# Patient Record
Sex: Female | Born: 1950
Health system: Southern US, Community
[De-identification: ages and names within clinical notes are randomized; demographics above are authoritative.]

## PROBLEM LIST (undated history)

## (undated) DIAGNOSIS — J45909 Unspecified asthma, uncomplicated: Secondary | ICD-10-CM

## (undated) DIAGNOSIS — F32A Depression, unspecified: Secondary | ICD-10-CM

## (undated) DIAGNOSIS — K5909 Other constipation: Secondary | ICD-10-CM

## (undated) DIAGNOSIS — M545 Low back pain, unspecified: Secondary | ICD-10-CM

## (undated) DIAGNOSIS — F419 Anxiety disorder, unspecified: Secondary | ICD-10-CM

## (undated) DIAGNOSIS — M25552 Pain in left hip: Secondary | ICD-10-CM

## (undated) DIAGNOSIS — D259 Leiomyoma of uterus, unspecified: Secondary | ICD-10-CM

## (undated) DIAGNOSIS — M25551 Pain in right hip: Secondary | ICD-10-CM

## (undated) DIAGNOSIS — R0600 Dyspnea, unspecified: Secondary | ICD-10-CM

## (undated) DIAGNOSIS — I2510 Atherosclerotic heart disease of native coronary artery without angina pectoris: Secondary | ICD-10-CM

## (undated) DIAGNOSIS — E785 Hyperlipidemia, unspecified: Secondary | ICD-10-CM

## (undated) DIAGNOSIS — J449 Chronic obstructive pulmonary disease, unspecified: Secondary | ICD-10-CM

## (undated) DIAGNOSIS — I10 Essential (primary) hypertension: Secondary | ICD-10-CM

## (undated) DIAGNOSIS — N894 Leukoplakia of vagina: Secondary | ICD-10-CM

## (undated) DIAGNOSIS — F329 Major depressive disorder, single episode, unspecified: Secondary | ICD-10-CM

## (undated) HISTORY — DX: Other constipation: K59.09

## (undated) HISTORY — DX: Depression, unspecified: F32.A

## (undated) HISTORY — DX: Low back pain: M54.5

## (undated) HISTORY — DX: Leiomyoma of uterus, unspecified: D25.9

## (undated) HISTORY — DX: Anxiety disorder, unspecified: F41.9

## (undated) HISTORY — DX: Chronic obstructive pulmonary disease, unspecified: J44.9

## (undated) HISTORY — PX: UTERINE FIBROID SURGERY: SHX826

## (undated) HISTORY — PX: LEEP: SHX91

## (undated) HISTORY — DX: Low back pain, unspecified: M54.50

## (undated) HISTORY — PX: TUBAL LIGATION: SHX77

## (undated) HISTORY — DX: Hyperlipidemia, unspecified: E78.5

## (undated) HISTORY — DX: Major depressive disorder, single episode, unspecified: F32.9

---

## 1998-10-05 ENCOUNTER — Other Ambulatory Visit: Admission: RE | Admit: 1998-10-05 | Discharge: 1998-10-05 | Payer: Self-pay | Admitting: Obstetrics and Gynecology

## 2000-09-26 ENCOUNTER — Other Ambulatory Visit: Admission: RE | Admit: 2000-09-26 | Discharge: 2000-09-26 | Payer: Self-pay | Admitting: Obstetrics

## 2000-09-26 ENCOUNTER — Encounter: Admission: RE | Admit: 2000-09-26 | Discharge: 2000-09-26 | Payer: Self-pay | Admitting: Obstetrics

## 2000-10-03 ENCOUNTER — Encounter: Admission: RE | Admit: 2000-10-03 | Discharge: 2000-10-03 | Payer: Self-pay | Admitting: Internal Medicine

## 2000-10-07 ENCOUNTER — Encounter: Admission: RE | Admit: 2000-10-07 | Discharge: 2000-10-07 | Payer: Self-pay | Admitting: Internal Medicine

## 2000-10-08 ENCOUNTER — Ambulatory Visit (HOSPITAL_COMMUNITY): Admission: RE | Admit: 2000-10-08 | Discharge: 2000-10-08 | Payer: Self-pay

## 2000-10-10 ENCOUNTER — Encounter: Admission: RE | Admit: 2000-10-10 | Discharge: 2000-10-10 | Payer: Self-pay | Admitting: Obstetrics

## 2000-11-20 ENCOUNTER — Encounter: Admission: RE | Admit: 2000-11-20 | Discharge: 2000-11-20 | Payer: Self-pay | Admitting: Obstetrics

## 2000-11-21 ENCOUNTER — Encounter: Admission: RE | Admit: 2000-11-21 | Discharge: 2000-11-21 | Payer: Self-pay | Admitting: Obstetrics

## 2000-11-22 ENCOUNTER — Inpatient Hospital Stay (HOSPITAL_COMMUNITY): Admission: AD | Admit: 2000-11-22 | Discharge: 2000-11-22 | Payer: Self-pay | Admitting: Obstetrics & Gynecology

## 2000-11-23 ENCOUNTER — Ambulatory Visit (HOSPITAL_COMMUNITY): Admission: RE | Admit: 2000-11-23 | Discharge: 2000-11-23 | Payer: Self-pay | Admitting: Obstetrics

## 2000-11-23 ENCOUNTER — Encounter (INDEPENDENT_AMBULATORY_CARE_PROVIDER_SITE_OTHER): Payer: Self-pay | Admitting: Specialist

## 2000-11-28 ENCOUNTER — Encounter: Admission: RE | Admit: 2000-11-28 | Discharge: 2000-11-28 | Payer: Self-pay | Admitting: Obstetrics

## 2000-11-29 HISTORY — PX: ABDOMINAL HYSTERECTOMY: SHX81

## 2000-12-09 ENCOUNTER — Encounter (INDEPENDENT_AMBULATORY_CARE_PROVIDER_SITE_OTHER): Payer: Self-pay

## 2000-12-09 ENCOUNTER — Inpatient Hospital Stay (HOSPITAL_COMMUNITY): Admission: RE | Admit: 2000-12-09 | Discharge: 2000-12-13 | Payer: Self-pay | Admitting: Obstetrics

## 2000-12-24 ENCOUNTER — Encounter: Admission: RE | Admit: 2000-12-24 | Discharge: 2000-12-24 | Payer: Self-pay | Admitting: Obstetrics & Gynecology

## 2001-01-30 ENCOUNTER — Encounter: Admission: RE | Admit: 2001-01-30 | Discharge: 2001-01-30 | Payer: Self-pay | Admitting: Obstetrics

## 2001-03-13 ENCOUNTER — Encounter: Admission: RE | Admit: 2001-03-13 | Discharge: 2001-03-13 | Payer: Self-pay | Admitting: *Deleted

## 2001-04-16 ENCOUNTER — Encounter: Admission: RE | Admit: 2001-04-16 | Discharge: 2001-04-16 | Payer: Self-pay | Admitting: Internal Medicine

## 2001-06-27 ENCOUNTER — Encounter: Admission: RE | Admit: 2001-06-27 | Discharge: 2001-06-27 | Payer: Self-pay | Admitting: Internal Medicine

## 2001-11-11 ENCOUNTER — Ambulatory Visit (HOSPITAL_COMMUNITY): Admission: RE | Admit: 2001-11-11 | Discharge: 2001-11-11 | Payer: Self-pay | Admitting: Internal Medicine

## 2001-11-11 ENCOUNTER — Encounter: Admission: RE | Admit: 2001-11-11 | Discharge: 2001-11-11 | Payer: Self-pay | Admitting: Internal Medicine

## 2002-10-12 ENCOUNTER — Encounter: Admission: RE | Admit: 2002-10-12 | Discharge: 2002-10-12 | Payer: Self-pay | Admitting: Internal Medicine

## 2003-09-08 ENCOUNTER — Other Ambulatory Visit: Admission: RE | Admit: 2003-09-08 | Discharge: 2003-09-08 | Payer: Self-pay | Admitting: Family Medicine

## 2003-09-15 ENCOUNTER — Ambulatory Visit (HOSPITAL_COMMUNITY): Admission: RE | Admit: 2003-09-15 | Discharge: 2003-09-15 | Payer: Self-pay | Admitting: Family Medicine

## 2003-10-26 ENCOUNTER — Encounter: Payer: Self-pay | Admitting: Internal Medicine

## 2003-10-26 ENCOUNTER — Ambulatory Visit (HOSPITAL_COMMUNITY): Admission: RE | Admit: 2003-10-26 | Discharge: 2003-10-26 | Payer: Self-pay | Admitting: Gastroenterology

## 2004-02-24 ENCOUNTER — Encounter: Admission: RE | Admit: 2004-02-24 | Discharge: 2004-02-24 | Payer: Self-pay | Admitting: Neurological Surgery

## 2005-10-02 ENCOUNTER — Ambulatory Visit: Payer: Self-pay | Admitting: Hospitalist

## 2005-10-04 ENCOUNTER — Ambulatory Visit (HOSPITAL_COMMUNITY): Admission: RE | Admit: 2005-10-04 | Discharge: 2005-10-04 | Payer: Self-pay | Admitting: Dermatology

## 2005-10-10 ENCOUNTER — Ambulatory Visit: Payer: Self-pay | Admitting: Internal Medicine

## 2005-10-25 ENCOUNTER — Emergency Department (HOSPITAL_COMMUNITY): Admission: EM | Admit: 2005-10-25 | Discharge: 2005-10-25 | Payer: Self-pay | Admitting: Emergency Medicine

## 2005-11-21 ENCOUNTER — Ambulatory Visit (HOSPITAL_COMMUNITY): Payer: Self-pay | Admitting: Psychiatry

## 2006-01-04 ENCOUNTER — Encounter (INDEPENDENT_AMBULATORY_CARE_PROVIDER_SITE_OTHER): Payer: Self-pay | Admitting: Dermatology

## 2006-01-04 ENCOUNTER — Ambulatory Visit: Payer: Self-pay | Admitting: Internal Medicine

## 2006-01-04 LAB — CONVERTED CEMR LAB
ALT: 12 units/L (ref 0–35)
Alkaline Phosphatase: 63 units/L (ref 39–117)
Sodium: 136 meq/L (ref 135–145)
Total Bilirubin: 0.6 mg/dL (ref 0.3–1.2)
Total Protein: 7.4 g/dL (ref 6.0–8.3)

## 2006-01-24 DIAGNOSIS — K649 Unspecified hemorrhoids: Secondary | ICD-10-CM | POA: Insufficient documentation

## 2006-01-24 DIAGNOSIS — D259 Leiomyoma of uterus, unspecified: Secondary | ICD-10-CM | POA: Insufficient documentation

## 2006-01-24 DIAGNOSIS — K5909 Other constipation: Secondary | ICD-10-CM | POA: Insufficient documentation

## 2006-01-24 DIAGNOSIS — F329 Major depressive disorder, single episode, unspecified: Secondary | ICD-10-CM | POA: Insufficient documentation

## 2006-01-24 DIAGNOSIS — F419 Anxiety disorder, unspecified: Secondary | ICD-10-CM | POA: Insufficient documentation

## 2006-01-24 DIAGNOSIS — J309 Allergic rhinitis, unspecified: Secondary | ICD-10-CM | POA: Insufficient documentation

## 2006-01-28 ENCOUNTER — Ambulatory Visit: Payer: Self-pay | Admitting: Internal Medicine

## 2006-03-25 ENCOUNTER — Ambulatory Visit: Payer: Self-pay | Admitting: Hospitalist

## 2006-03-25 DIAGNOSIS — IMO0002 Reserved for concepts with insufficient information to code with codable children: Secondary | ICD-10-CM | POA: Insufficient documentation

## 2006-07-02 ENCOUNTER — Ambulatory Visit: Payer: Self-pay | Admitting: Internal Medicine

## 2006-07-02 ENCOUNTER — Encounter (INDEPENDENT_AMBULATORY_CARE_PROVIDER_SITE_OTHER): Payer: Self-pay | Admitting: Dermatology

## 2006-07-23 ENCOUNTER — Encounter (INDEPENDENT_AMBULATORY_CARE_PROVIDER_SITE_OTHER): Payer: Self-pay | Admitting: Dermatology

## 2006-08-21 ENCOUNTER — Encounter (INDEPENDENT_AMBULATORY_CARE_PROVIDER_SITE_OTHER): Payer: Self-pay | Admitting: Internal Medicine

## 2008-05-18 ENCOUNTER — Ambulatory Visit: Payer: Self-pay | Admitting: Internal Medicine

## 2008-05-18 ENCOUNTER — Encounter (INDEPENDENT_AMBULATORY_CARE_PROVIDER_SITE_OTHER): Payer: Self-pay | Admitting: Internal Medicine

## 2008-05-18 DIAGNOSIS — N894 Leukoplakia of vagina: Secondary | ICD-10-CM | POA: Insufficient documentation

## 2008-05-18 DIAGNOSIS — R5381 Other malaise: Secondary | ICD-10-CM | POA: Insufficient documentation

## 2008-05-18 DIAGNOSIS — R5383 Other fatigue: Secondary | ICD-10-CM

## 2008-05-19 ENCOUNTER — Ambulatory Visit (HOSPITAL_COMMUNITY): Admission: RE | Admit: 2008-05-19 | Discharge: 2008-05-19 | Payer: Self-pay | Admitting: Internal Medicine

## 2008-05-19 ENCOUNTER — Encounter (INDEPENDENT_AMBULATORY_CARE_PROVIDER_SITE_OTHER): Payer: Self-pay | Admitting: Internal Medicine

## 2008-05-19 DIAGNOSIS — E785 Hyperlipidemia, unspecified: Secondary | ICD-10-CM | POA: Insufficient documentation

## 2008-05-19 LAB — CONVERTED CEMR LAB
Basophils Absolute: 0.1 10*3/uL (ref 0.0–0.1)
CO2: 26 meq/L (ref 19–32)
Candida species: NEGATIVE
Cholesterol: 204 mg/dL — ABNORMAL HIGH (ref 0–200)
GFR calc Af Amer: >60 mL/min (ref >60)
GFR calc non Af Amer: >60 mL/min (ref >60)
Gardnerella vaginalis: POSITIVE — AB
Glucose, Bld: 89 mg/dL (ref 70–99)
Hemoglobin: 12.2 g/dL (ref 12.0–15.0)
Lymphocytes Relative: 37 % (ref 12–46)
Monocytes Absolute: 0.4 10*3/uL (ref 0.1–1.0)
Neutro Abs: 3.9 10*3/uL (ref 1.7–7.7)
Neutrophils Relative %: 54 % (ref 43–77)
Platelets: 234 10*3/uL (ref 150–400)
RDW: 13.4 % (ref 11.5–15.5)
Sed Rate: 9 mm/hr (ref 0–22)
Sodium: 137 meq/L (ref 135–145)
Total Bilirubin: 0.4 mg/dL (ref 0.3–1.2)
Total Protein: 7.7 g/dL (ref 6.0–8.3)
Trichomonal Vaginitis: NEGATIVE

## 2008-06-16 ENCOUNTER — Ambulatory Visit: Payer: Self-pay | Admitting: Infectious Disease

## 2008-10-08 ENCOUNTER — Ambulatory Visit: Payer: Self-pay | Admitting: Infectious Diseases

## 2008-10-08 DIAGNOSIS — M25559 Pain in unspecified hip: Secondary | ICD-10-CM | POA: Insufficient documentation

## 2008-10-21 ENCOUNTER — Ambulatory Visit (HOSPITAL_COMMUNITY): Admission: RE | Admit: 2008-10-21 | Discharge: 2008-10-21 | Payer: Self-pay | Admitting: Infectious Diseases

## 2009-07-05 ENCOUNTER — Ambulatory Visit: Payer: Self-pay | Admitting: Internal Medicine

## 2009-07-20 ENCOUNTER — Ambulatory Visit: Payer: Self-pay | Admitting: Internal Medicine

## 2009-07-21 ENCOUNTER — Ambulatory Visit: Payer: Self-pay | Admitting: Internal Medicine

## 2009-07-25 LAB — CONVERTED CEMR LAB
Basophils Absolute: 0 10*3/uL (ref 0.0–0.1)
CO2: 26 meq/L (ref 19–32)
Calcium: 9.1 mg/dL (ref 8.4–10.5)
Chloride: 109 meq/L (ref 96–112)
Cholesterol: 216 mg/dL — ABNORMAL HIGH (ref 0–200)
Creatinine, Ser: 0.7 mg/dL (ref 0.4–1.2)
Eosinophils Absolute: 0.1 10*3/uL (ref 0.0–0.7)
Glucose, Bld: 88 mg/dL (ref 70–99)
Lymphocytes Relative: 35.9 % (ref 12.0–46.0)
MCHC: 34.4 g/dL (ref 30.0–36.0)
MCV: 89 fL (ref 78.0–100.0)
Monocytes Absolute: 0.3 10*3/uL (ref 0.1–1.0)
Neutro Abs: 3.4 10*3/uL (ref 1.4–7.7)
Neutrophils Relative %: 56.1 % (ref 43.0–77.0)
RDW: 13.8 % (ref 11.5–14.6)
TSH: 0.64 microintl units/mL (ref 0.35–5.50)
Triglycerides: 123 mg/dL (ref 0.0–149.0)

## 2009-07-26 ENCOUNTER — Telehealth: Payer: Self-pay | Admitting: Internal Medicine

## 2009-08-02 ENCOUNTER — Encounter (INDEPENDENT_AMBULATORY_CARE_PROVIDER_SITE_OTHER): Payer: Self-pay | Admitting: *Deleted

## 2009-09-19 ENCOUNTER — Ambulatory Visit: Payer: Self-pay | Admitting: Internal Medicine

## 2009-10-07 ENCOUNTER — Ambulatory Visit: Payer: Self-pay | Admitting: Internal Medicine

## 2009-10-07 DIAGNOSIS — M5416 Radiculopathy, lumbar region: Secondary | ICD-10-CM | POA: Insufficient documentation

## 2010-01-09 ENCOUNTER — Ambulatory Visit: Payer: Self-pay | Admitting: Internal Medicine

## 2010-01-09 DIAGNOSIS — J069 Acute upper respiratory infection, unspecified: Secondary | ICD-10-CM | POA: Insufficient documentation

## 2010-02-19 ENCOUNTER — Encounter: Payer: Self-pay | Admitting: Infectious Diseases

## 2010-02-28 NOTE — Progress Notes (Signed)
Summary: needs iFOB (lmom 6/28)  Phone Note Outgoing Call   Summary of Call: see addendum to the last office visit She needs an iFOB for colon cancer screening Please call the patient, let  her no that we are mailing her  a iFOB kit to  return it at her convenience Ridgely E. Lary Eckardt MD  July 26, 2009 2:51 PM   Follow-up for Phone Call        left message for pt to call back. [name] CMA  July 26, 2009 3:45 PM   Additional Follow-up for Phone Call Additional follow up Details #1::        Pt is aware, will mail to her. [name] CMA  July 27, 2009 3:54 PM

## 2010-02-28 NOTE — Letter (Signed)
Summary: [facility]: Immunoassay Fecal Occult Blood (iFOB) Order Form  [facility] [place]  [address] [place], [place] [address]   Phone: [phone]  Fax: [phone]      [facility]: Immunoassay Fecal Occult Blood (iFOB) Order Form   August 02, 2009 MRN: [id number]   Veronica [name] April 13, 1950   Physicican Name:___jose,paz md______________________  Diagnosis Code:______v76.51____________________      [name] CMA

## 2010-02-28 NOTE — Procedures (Signed)
Summary: Colonoscopy/[facility]  Colonoscopy/[facility]   Imported By: Lanelle Bal 08/04/2009 08:44:04  _____________________________________________________________________  External Attachment:    Type:   Image     Comment:   External Document

## 2010-02-28 NOTE — Assessment & Plan Note (Signed)
Summary: BACK PAIN/RH......Marland Kitchen   Vital Signs:  Patient profile:   60 year old female Weight:      214.25 pounds Pulse rate:   75 / minute Pulse rhythm:   regular BP sitting:   122 / 78  (left arm) Cuff size:   regular  Vitals Entered By: [name] CMA (October 07, 2009 10:57 AM) CC: Upper back pain x 4 days.  Comments Worse at night. Taking ibruprofen. [facility] Veterinary surgeon - Pharm flu shot   History of Present Illness: 4 days history of upper back pain, points to the right sided paraspinal muscles Indocin helps for a few hours diazepam is taken as a muscle relaxant, helping?   ROS denies fevers or cough No rash anywhere in the back No neck or shoulder pain per se No recent injury or heavy lifting  Current Medications (verified): 1)  Over-The-Counter Calcium 2 G of Vitamin D 600 Units Daily 2)  Indomethacin 25 Mg Caps (Indomethacin) .... 2 By Mouth Q12hrs As Needed 3)  Diazepam 5 Mg Tabs (Diazepam) .Marland Kitchen.. 1 By Mouth At  H.s.  Allergies (verified): No Known Drug Allergies  Past History:  Past Medical History: Reviewed history from 07/20/2009 and no changes required. Allergic rhinitis Anxiety, depression  Hemorrhoids Constipation, chronic Hx uterine fibroids Low-back pain with left-sided radiculopathy, s/p injection at L4-L5 foramen (1/06)  Past Surgical History: Tubal ligation, bilateral,1989 Hysterectomy, total abdominal, November 2002  (at [facility]) LOOP, unknown date  Social History: Reviewed history from 07/20/2009 and no changes required. 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  no apparent distress Neck:  full range of motion, nontender to palpation of the cervical spine Lungs:  normal respiratory  effort, no intercostal retractions, no accessory muscle use, and normal breath sounds.   Heart:  normal rate, regular rhythm, and no murmur.   Msk:  not tender a day thoracic spine or paraspinal muscles. Shoulder range of motion within normal Extremities:  no edema Neurologic:  alert & oriented X3, strength normal in all extremities, and gait normal.     Impression & Recommendations:  Problem # 1:  BACK PAIN, UPPER (ICD-724.5) likely muscle skeletal pain. Plan: Trial with Flexeril and Vicodin. See instructions Chest x-ray Her updated medication list for this problem includes:    Indomethacin 25 Mg Caps (Indomethacin) .Marland Kitchen... 2 by mouth q12hrs as needed    Flexeril 10 Mg Tabs (Cyclobenzaprine hcl) ..... One by mouth at bedtime as needed for pain    Vicodin 5-500 Mg Tabs (Hydrocodone-acetaminophen) ..... One every 6 hours as needed for pain  Orders: T-2 View CXR (71020TC)  Complete Medication List: 1)  Over-the-counter Calcium 2 G of Vitamin D 600 Units Daily  2)  Indomethacin 25 Mg Caps (Indomethacin) .... 2 by mouth q12hrs as needed 3)  Diazepam 5 Mg Tabs (Diazepam) .Marland Kitchen.. 1 by mouth at  h.s. 4)  Flexeril 10 Mg Tabs (Cyclobenzaprine hcl) .... One by mouth at bedtime as needed for pain 5)  Vicodin 5-500 Mg Tabs (Hydrocodone-acetaminophen) .... One every 6 hours as needed for pain  Other Orders: Admin 1st Vaccine (10272) Flu Vaccine 69yrs + (53664) Flu Vaccine Consent Questions     Do you have a history of severe allergic reactions to this vaccine?  no    Any prior history of allergic reactions to egg and/or gelatin? no    Do you have a sensitivity to the preservative Thimersol? no    Do you have a past history of Guillan-Barre Syndrome? no    Do you currently have an acute febrile illness? no    Have you ever had a severe reaction to latex? no    Vaccine information given and explained to patient? yes    Are you currently pregnant? no    Lot Number:[id number]   Exp  Date:07/29/2010   Site Given  Left Deltoid IM  Patient Instructions: 1)  continue Indocin 2)  Instead of diazepam try Flexeril at bedtime and see if that works better. Don't take both. 3)  If pain is severe, you can take Vicodin. Will cause drowsiness  4)  chest x-ray 5)  Warm compress 6)  call us if not better in 10 days Prescriptions: VICODIN 5-500 MG TABS (HYDROCODONE-ACETAMINOPHEN) one every 6 hours as needed for pain  #20 x 0   Entered and Authorized by:   Nolon Rod. Karlea Mckibbin MD   Signed by:   Nolon Rod. Lorenza Winkleman MD on 10/07/2009   Method used:   Print then Give to Patient   RxID:   [id number] FLEXERIL 10 MG TABS (CYCLOBENZAPRINE HCL) one by mouth at bedtime as needed for pain  #20 x 0   Entered and Authorized by:   Nolon Rod. Patches Mcdonnell MD   Signed by:   Nolon Rod. Rease Wence MD on 10/07/2009   Method used:   Print then Give to Patient   RxID:   [id number]    .lbflu

## 2010-02-28 NOTE — Assessment & Plan Note (Signed)
Summary: new to est/cpx//kn   Vital Signs:  Patient profile:   60 year old female Height:      65 inches Weight:      212 pounds BMI:     35.41 Temp:     98.5 degrees F oral Pulse rate:   66 / minute Resp:     16 per minute BP sitting:   110 / 70  (left arm)  Vitals Entered By: Jeremy Johann CMA (July 20, 2009 2:40 PM) CC: NEW TO ESTABLISH Comments --NOT FASTING --NO PAP REVIEWED MED LIST, PATIENT AGREED DOSE AND INSTRUCTION CORRECT    History of Present Illness: CPX   Allergies (verified): No Known Drug Allergies  Past History:  Past Medical History: Allergic rhinitis Anxiety, depression  Hemorrhoids Constipation, chronic Hx uterine fibroids Low-back pain with left-sided radiculopathy, s/p injection at L4-L5 foramen (1/06)  Past Surgical History:  Tubal ligation, bilateral,1989 Hysterectomy, total abdominal, November 2002  (at [facility]) LOOP, unknown date  Family History: some family members with schizophrenia (son, brother, 2 nieces) DM--no MI--no colon ca--no breast ca--no  Social History: 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 GI:  no nausea, vomiting, diarrhea. No blood in the stools. GU:  she does self breast exams sometimes Denies vaginal discharge or bleeding. MS:  long history of back pain, currently symptoms are quiet. Exercise helps. Psych:  emotionally doing well, no anxiety or depression.  Physical Exam  General:  alert and well-developed.   Neck:  no masses and no thyromegaly.   Breasts:  No mass, nodules, thickening, tenderness, bulging, retraction, inflamation, nipple discharge or skin changes noted.   Lungs:  normal respiratory effort, no intercostal retractions, no  accessory muscle use, and normal breath sounds.   Heart:  normal rate, regular rhythm, and no murmur.   Abdomen:  soft, normal bowel sounds, no distention, no masses, no guarding, and no rigidity.  slightly tender in the epigastric area without mass or rebound Extremities:  note or extremity edema Neurologic:  alert & oriented X3, strength normal in all extremities, and gait normal.   Psych:  Cognition and judgment appear intact. Alert and cooperative with normal attention span and concentration.  not anxious appearing and not depressed appearing.     Impression & Recommendations:  Problem # 1:  PREVENTIVE HEALTH CARE (ICD-V70.0)  reports tetanus shot less than 10 years ago  Reports a colonoscopy at [facility] approximately 6 years ago (2005) ; no records found in EMR. We'll call the hospital.  Due for a mammogram, patient aware breast exam today (-)   hysterectomy 2002 for bleeding , last PAP 2010 per patient  was told does not need  further  PAPs: rec a PAP q 3 years     Discussed diet and exercise Calcium and vitamin D daily Labs EKG-- NSR   Orders: EKG w/ Interpretation (93000)  Complete Medication List: 1)  Over-the-counter Calcium 2 G of Vitamin D 600 Units Daily   Patient Instructions: 1)  please come back fasting 2)  FLP, BMP, CBC, AST, ALT, TSH --- dx V70 3)  Please schedule a follow-up appointment in 1 year.     Appended Document: new to est/cpx//kn colonoscopy report review it and will  be scanned GI Dr. Sherin Quarry Procedure was October 26, 2003: Left-sided diverticulosis Recommend flex sigmoidoscopy in 5 years (2010) Recommend a repeat colonoscopy in 10 years (2015) Plan: Will send an iFOB

## 2010-03-02 NOTE — Assessment & Plan Note (Signed)
Summary: cold and cough; rt thumb hurts///sph   Vital Signs:  Patient profile:   60 year old female Weight:      225.50 pounds O2 Sat:      97 % on Room air Temp:     98.9 degrees F oral Pulse rate:   77 / minute Pulse rhythm:   regular BP sitting:   128 / 89  (left arm) Cuff size:   regular  Vitals Entered By: [name] CMA (January 09, 2010 2:22 PM)  O2 Flow:  Room air CC: c/o chest and head congestion Comments x 10 days drainage  eyes swelling c/o (R) thumb pain x 2 weeks [facility]    History of Present Illness: 10 days h/o:  chest and head congestion nasal drainage  eyes swelling   c/o (R) thumb pain x 2 weeks  Current Medications (verified): 1)  Over-The-Counter Calcium 2 G of Vitamin D 600 Units Daily 2)  Ibuprofen 200 Mg Tabs (Ibuprofen) .... As Needed  Allergies (verified): No Known Drug Allergies  Past History:  Past Medical History: Reviewed history from 07/20/2009 and no changes required. Allergic rhinitis Anxiety, depression  Hemorrhoids Constipation, chronic Hx uterine fibroids Low-back pain with left-sided radiculopathy, s/p injection at L4-L5 foramen (1/06)  Past Surgical History: Reviewed history from 10/07/2009 and no changes required. Tubal ligation, bilateral,1989 Hysterectomy, total abdominal, November 2002  (at [facility]) LOOP, unknown date  Social History: Reviewed history from 07/20/2009 and no changes required. 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 ENT:  Denies earache and sore throat. GI:  Denies diarrhea, nausea, and vomiting. MS:  Denies muscle aches.  Physical Exam  General:  alert and well-developed.   Head:  face symmetric, slightly tender at the  maxillary sinus area Ears:  R ear normal and L ear normal.   Nose:  slightly congested Mouth:  no redness or discharge Lungs:  normal respiratory effort, no intercostal retractions, no accessory muscle use, and normal breath sounds.   Extremities:  thumb on the right---- slightly swollen, no red or warm. There is a click whenever she flexes or extends the thumb L thumb normal   Impression & Recommendations:  Problem # 1:  URI (ICD-465.9) see instructions    Her updated medication list for this problem includes:    Ibuprofen 200 Mg Tabs (Ibuprofen) .Marland Kitchen... As needed  Problem # 2:  thumb pain will see her ortho  Complete Medication List: 1)  Over-the-counter Calcium 2 G of Vitamin D 600 Units Daily  2)  Ibuprofen 200 Mg Tabs (Ibuprofen) .... As needed 3)  Amoxicillin 500 Mg Caps (Amoxicillin) .... 2 by mouth two times a day  Patient Instructions: 1)  rest, fluids, tylenol 2)  mucinex DM twice a day as needed for cough or dayquil 3)  amoxicillin x 1 week 4)  call if no better in few days  Prescriptions: AMOXICILLIN 500 MG CAPS (AMOXICILLIN) 2 by mouth two times a day  #28 x 0   Entered and Authorized by:   Nolon Rod. Paz MD   Signed by:   Nolon Rod. Paz MD on 01/09/2010   Method used:   Print then Give to Patient   RxID:   [id number]    Orders  Added: 1)  Est. Patient Level III [29562]

## 2010-06-16 NOTE — Op Note (Signed)
[facility]  Patient:    Veronica [name], Veronica [name] Visit Number: [id number] MRN: [id number]          Service Type: GYN Location: 9300 [id number] Attending Physician:  Tammi Sou Dictated by:   Bing Neighbors Clearance Coots, M.D. Proc. Date: 12/09/00 Admit Date:  12/09/2000                             Operative Report  PREOPERATIVE DIAGNOSES:       Symptomatic uterine fibroids.  POSTOPERATIVE DIAGNOSES:      Symptomatic uterine fibroids.  PROCEDURE:                    Total abdominal hysterectomy.  SURGEON:                      Charles A. Clearance Coots, M.D.  ASSISTANT:                    Roseanna Rainbow, M.D.  ANESTHESIA:                   General.  ESTIMATED BLOOD LOSS:         450 ml.  INTRAVENOUS FLUIDS:           1500 ml.  URINE OUT:                    200 ml clear.  COMPLICATIONS:                None.  DRAINS:                       Foley to gravity.  SPECIMEN:                     Uterus with cervix.  FINDINGS:                     Approximately 16 week size uterus with multiple uterine fibroids.  Normal ovaries and fallopian tubes.  OPERATION:                    Patient was brought to the operating room and after satisfactory general endotracheal anesthesia, the abdomen was prepped and draped in the usual sterile fashion in the supine position.  An indwelling Foley catheter was inserted in the urinary bladder.  A Pfannenstiel skin incision was made with the scalpel that was deepened down to the fascia with the scalpel.  The fascia was nicked in the midline and the fascial incision was extended to the left and to the right with curved Mayo scissors.  The superior and inferior fascial edges were taken off the rectus muscle with both blunt and sharp dissection.  The rectus muscle was then bluntly divided in the midline and peritoneum was entered digitally and was digitally extended to the left and to the right.  The bladder blade was then  positioned.  The bowel was packed off after insertion of an Landscape architect.  The utero-ovarian ligaments and round ligaments were then grasped with Kelly forceps bilaterally and the uterus was exteriorized.  The round ligaments were grasped with Kelly forceps and were transected bilaterally with the Bovie. The vesicouterine fold of peritoneum anteriorly was transected with the Bovie and the bladder was pushed downward away from the operative field.  The remainder of the ______  fibers of the bladder were transected sharply and further developed bluntly away from the operative field.  The white epithelium of the lower uterine segment and cervical end of the uterus could then be visualized quite well.  The posterior peritoneum was transected with the Bovie down to the uterosacral ligament.  A window was bluntly made in the broad ligament parallel to the uterus close to the uterine body.  Two Heaney forceps were placed across the utero-ovarian ligaments and broad ligaments and the ligaments were then transected on the right and a free tie was placed beneath the clamp and transfixing suture was placed above the knot.  Same procedure was performed on the opposite side.  The uterine vessels on the right were then skeletonized and curved Heaney was placed across the uterine vessels at the level of the internal os of the cervix.  A straight Heaney clamp was placed above to prevent back flow and the vessels were transected with the scalpel and suture ligated with a transfixion suture of 0 Vicryl.  The same procedure was performed on the opposite side without complications.  The cardinal ligaments were then serially transected and suture ligated with transfixion sutures bilaterally down to the uterosacral ligaments which were transected and suture ligated bilaterally.  The vagina was then entered with the next bite on the right and the vagina including the uterosacral ligament was suture  ligated with 0 Vicryl transfixion suture.  Same procedure was performed on the opposite side.  Two parametrial clamps were then placed across the vaginal cuff and the specimen was removed and submitted to pathology for evaluation.  A transfixing suture was placed beneath the clamp bilaterally with 0 Vicryl and a figure-of-eight suture was placed in the center of the vaginal cuff for hemostasis.  There was no active bleeding noted at the vaginal cuff at the conclusion of the procedure.  The pelvic cavity was thoroughly irrigated with warm saline solution.  All pedicles were then reexamined including the utero-ovarian and broad ligament pedicles and the vaginal cuff was again examined for hemostasis and no active bleeding was noted.  All instruments were then retired and the abdominal packing was removed.  The abdomen was then closed as follows.  The fascia was closed with a continuous suture of 0 PDS from each corner to the center.  Subcutaneous tissue was thoroughly irrigated with warm saline solution and all areas of subcutaneous bleeding were coagulated with the Bovie.  The skin was then approximated with surgical stainless steel staples.  A sterile bandage was applied to the incision closure.  Surgical technician indicated that all needle, sponge, and instrument counts were correct.  Patient tolerated the procedure very well, was transported to the recovery room in satisfactory condition. Dictated by:   Bing Neighbors Clearance Coots, M.D. Attending Physician:  Tammi Sou DD:  12/09/00 TD:  12/10/00 Job: [id number]

## 2010-06-16 NOTE — Discharge Summary (Signed)
[facility]  Patient:    Veronica [name], Veronica [name] Visit Number: [id number] MRN: [id number]          Service Type: GYN Location: 9300 [id number] Attending Physician:  Tammi Sou Dictated by:   Bing Neighbors Clearance Coots, M.D. Admit Date:  12/09/2000 Discharge Date: 12/13/2000   CC:         [facility] G[facility] clinic   Discharge Summary  ADMISSION DIAGNOSIS:  Symptomatic uterine fibroids unresponsive to medical therapy.  DISCHARGE DIAGNOSIS:  Symptomatic uterine fibroids unresponsive to medical therapy.  CONDITION ON DISCHARGE:  Discharged home in good condition after a total abdominal hysterectomy.  REASON FOR ADMISSION:  A 60 year old Hispanic female, G3, P3, admitted for total abdominal hysterectomy for symptomatic uterine fibroids.  The patient had been tried on multiple medical regimens, including hormonal therapy, nonsteroidal anti-inflammatory agents, and D&C.  She continued to have heavy painful periods, and desired definitive surgical therapy.  PAST MEDICAL AND SURGICAL HISTORY: 1. Tubal ligation. 2. Loop electrosurgical excision procedure.  ILLNESSES:  None.  MEDICATIONS: 1. Premarin. 2. Provera. 3. Nonsteroidal anti-inflammatory agents.  ALLERGIES:  No known drug allergies.  SOCIAL HISTORY:  Denies alcohol, tobacco, or recreational drug use.  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  She is single.  FAMILY HISTORY:  Noncontributory.  REVIEW OF SYSTEMS:  Per history of present illness.  PHYSICAL EXAMINATION:  GENERAL:  A well-developed, well-nourished Hispanic female in no acute distress.  VITAL SIGNS:  Temperature 98.4, blood pressure 120/74.  HEENT:  Benign.  LUNGS:  Clear to auscultation.  CARDIOVASCULAR:  Regular rate and rhythm without murmurs, rubs, or gallops.  ABDOMEN:  Soft, nontender, no palpable organomegaly appreciated.  PELVIC:  Normal external female genitalia, vaginal mucosa normal,  cervix parous without lesions, discharge, or bleeding.  Bimanual examination reveals her uterus to be about 16 weeks size, mid position, there are no adnexal masses or tenderness appreciated.  LABORATORY DATA:  Hemoglobin 11.2, hematocrit 33, white blood cell count 7700, platelets 330,000.  Coags were within normal limits.  Basic metabolic panel within normal limits.  Urinalysis within normal limits.  IMPRESSION:  Symptomatic uterine fibroids.  PLAN:  Total abdominal hysterectomy.  HOSPITAL COURSE:  The patient underwent a total abdominal hysterectomy on 12/09/00.  There were no intraoperative complications.  Postoperative course was uncomplicated except there was a slow return to active bowel function, but she was tolerating a regular diet by postoperative day #3.  The patient was discharged home in good condition on postoperative day #4.  DISCHARGE LABORATORY DATA:  Hemoglobin 9.7, hematocrit 27.9, white blood cell count 8400, platelets 269,000.  DISCHARGE MEDICATIONS: 1. Tylox one or two tablets q.4h. p.r.n. pain. 2. Ibuprofen 600 mg q.6h. supplemental to the Tylox p.r.n. pain. 3. Valium 5 mg p.o. q.12h. p.r.n. anxiety.  Routine written instructions were given for diet, activity, and wound care.  FOLLOWUP:  The patient is to call the outpatient department of GYN clinic with Dr. Clearance Coots in two weeks. Dictated by:   Bing Neighbors Clearance Coots, M.D. Attending Physician:  Tammi Sou DD:  12/13/00 TD:  12/13/00 Job: [id number]

## 2010-06-16 NOTE — Op Note (Signed)
[facility]  Patient:    Veronica [name], Veronica [name] Visit Number: [id number] MRN: [id number]          Service Type: GYN Location: 9300 [id number] Attending Physician:  Tammi Sou Dictated by:   Bing Neighbors Clearance Coots, M.D. Proc. Date: 11/23/00 Admit Date:  12/09/2000   CC:         [facility], GYN clinic   Operative Report  PREOPERATIVE DIAGNOSIS:       Uterine fibroids, menorrhagia.  POSTOPERATIVE DIAGNOSIS:      Uterine fibroids, menorrhagia.  OPERATION:                    Dilatation and curettage.  SURGEON:                      Charles A. Clearance Coots, M.D.  ANESTHESIA:                   MAC with paracervical block.  ESTIMATED BLOOD LOSS:         100 ml.  COMPLICATIONS:                None.  SPECIMEN:                     Endometrial curettings.  DESCRIPTION OF PROCEDURE:     The patient was brought to the operating room and after satisfactory IV sedation, the legs were brought up in stirrups and the vagina was prepped and draped in the usual sterile fashion.  Urinary bladder was emptied of approximately 50 ml of clear urine.  Bimanual examination revealed the uterus to be about 16 weeks size, mid position. Sterile speculum was inserted into the vaginal vault and cervix was isolated. The anterior lip of the cervix grasped with a single-tooth tenaculum. Paracervical block with 2% Xylocaine with sodium bicarbonate, a mixture of 20 ml of Xylocaine with 2 ml of sodium bicarbonate was injected at the 3 and 9 oclock positions in the lateral fornix, approximately 8 ml in each lateral fornix and approximately 4 ml in the anterior lip of the cervix prior to application of the single-tooth tenaculum.  The uterus was then sounded and cervix was dilated to a #27 Pratt dilator.  A small sharp curet was then introduced into the uterine cavity and the endometrium was thoroughly curetted and endometrial specimen was submitted to pathology for  evaluation.  There was no active bleeding at the conclusion of the procedure.  All instruments were retired.  The patient tolerated the procedure well and was transported to the recovery room in satisfactory condition. Dictated by:   Bing Neighbors Clearance Coots, M.D. Attending Physician:  Tammi Sou DD:  12/11/00 TD:  12/11/00 Job: [id number]

## 2010-06-30 DIAGNOSIS — I2510 Atherosclerotic heart disease of native coronary artery without angina pectoris: Secondary | ICD-10-CM

## 2010-06-30 HISTORY — DX: Atherosclerotic heart disease of native coronary artery without angina pectoris: I25.10

## 2010-07-26 ENCOUNTER — Observation Stay (HOSPITAL_COMMUNITY)
Admission: EM | Admit: 2010-07-26 | Discharge: 2010-07-27 | Disposition: A | Discharge status: Home / Self Care | Payer: BC Managed Care – PPO | Source: Ambulatory Visit | Attending: Emergency Medicine | Admitting: Emergency Medicine

## 2010-07-26 ENCOUNTER — Emergency Department (HOSPITAL_COMMUNITY): Payer: BC Managed Care – PPO

## 2010-07-26 DIAGNOSIS — R079 Chest pain, unspecified: Secondary | ICD-10-CM | POA: Insufficient documentation

## 2010-07-26 DIAGNOSIS — N390 Urinary tract infection, site not specified: Secondary | ICD-10-CM | POA: Insufficient documentation

## 2010-07-26 DIAGNOSIS — R51 Headache: Principal | ICD-10-CM | POA: Insufficient documentation

## 2010-07-26 DIAGNOSIS — I10 Essential (primary) hypertension: Secondary | ICD-10-CM | POA: Insufficient documentation

## 2010-07-26 LAB — CK TOTAL AND CKMB (NOT AT ARMC): CK, MB: 1.7 ng/mL (ref 0.3–4.0)

## 2010-07-26 LAB — POCT I-STAT, CHEM 8
Calcium, Ion: 1.18 mmol/L (ref 1.12–1.32)
Creatinine, Ser: 0.8 mg/dL (ref 0.50–1.10)
Glucose, Bld: 95 mg/dL (ref 70–99)
HCT: 40 % (ref 36.0–46.0)
Hemoglobin: 13.6 g/dL (ref 12.0–15.0)
Potassium: 4.1 mEq/L (ref 3.5–5.1)
TCO2: 25 mmol/L (ref 0–100)

## 2010-07-26 LAB — CBC
HCT: 37 % (ref 36.0–46.0)
Hemoglobin: 13 g/dL (ref 12.0–15.0)
MCH: 29.7 pg (ref 26.0–34.0)
MCHC: 35.1 g/dL (ref 30.0–36.0)
MCV: 84.7 fL (ref 78.0–100.0)
RDW: 12.7 % (ref 11.5–15.5)

## 2010-07-26 LAB — DIFFERENTIAL
Basophils Absolute: 0 10*3/uL (ref 0.0–0.1)
Eosinophils Relative: 2 % (ref 0–5)
Lymphocytes Relative: 29 % (ref 12–46)
Monocytes Absolute: 0.2 10*3/uL (ref 0.1–1.0)
Monocytes Relative: 4 % (ref 3–12)
Neutro Abs: 4.1 10*3/uL (ref 1.7–7.7)

## 2010-07-27 ENCOUNTER — Encounter (HOSPITAL_COMMUNITY): Payer: Self-pay

## 2010-07-27 ENCOUNTER — Emergency Department (HOSPITAL_COMMUNITY): Payer: BC Managed Care – PPO

## 2010-07-27 LAB — URINALYSIS, ROUTINE W REFLEX MICROSCOPIC
Glucose, UA: NEGATIVE mg/dL
Ketones, ur: NEGATIVE mg/dL
Protein, ur: NEGATIVE mg/dL
pH: 7 (ref 5.0–8.0)

## 2010-07-27 LAB — CK TOTAL AND CKMB (NOT AT ARMC)
CK, MB: 1.5 ng/mL (ref 0.3–4.0)
CK, MB: 1.6 ng/mL (ref 0.3–4.0)
Relative Index: INVALID (ref 0.0–2.5)
Total CK: 61 U/L (ref 7–177)
Total CK: 73 U/L (ref 7–177)

## 2010-07-27 LAB — URINE MICROSCOPIC-ADD ON

## 2010-07-27 LAB — TROPONIN I: Troponin I: <0.3 ng/mL (ref <0.30)

## 2010-07-27 MED ORDER — IOHEXOL 350 MG/ML SOLN
80.0000 mL | Freq: Once | INTRAVENOUS | Status: AC | PRN
  Administered 2010-07-27: 80 mL via INTRAVENOUS

## 2010-07-28 ENCOUNTER — Telehealth: Payer: Self-pay | Admitting: Internal Medicine

## 2010-07-28 NOTE — Telephone Encounter (Signed)
Please call the patient: I received   results on hospital. The urine culture is becoming positive. If she is at a hospital or was prescribed antibiotics then is ok. If not, call  Cipro 500 mg 1 by mouth twice a day for 7 days. Needs a followup next week.

## 2010-07-28 NOTE — Telephone Encounter (Signed)
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 Tried pts contact Alex- left message.

## 2010-07-29 LAB — URINE CULTURE
Colony Count: >=100000
Culture  Setup Time: 201206281312

## 2010-07-31 ENCOUNTER — Encounter: Payer: Self-pay | Admitting: Internal Medicine

## 2010-07-31 ENCOUNTER — Telehealth: Payer: Self-pay | Admitting: Internal Medicine

## 2010-07-31 ENCOUNTER — Encounter: Payer: BC Managed Care – PPO | Admitting: Internal Medicine

## 2010-07-31 NOTE — Telephone Encounter (Signed)
Advise patient: She cancelled her OV for today, does need a f/u, needs to be taking abx. We are here to help, let us know if she needs [place]

## 2010-07-31 NOTE — Telephone Encounter (Signed)
Pt has appt today for a f/u.

## 2010-08-01 ENCOUNTER — Ambulatory Visit: Payer: BC Managed Care – PPO | Admitting: Cardiology

## 2010-08-01 NOTE — Telephone Encounter (Signed)
Message left for patient to return my call.  

## 2010-08-03 NOTE — Telephone Encounter (Signed)
Left message for pt's son to call back

## 2010-08-04 NOTE — Telephone Encounter (Signed)
Noted , will see her when she calls

## 2010-08-04 NOTE — Telephone Encounter (Signed)
Unable to reach pt

## 2010-08-08 ENCOUNTER — Other Ambulatory Visit: Payer: Self-pay | Admitting: Internal Medicine

## 2010-08-08 DIAGNOSIS — Z1231 Encounter for screening mammogram for malignant neoplasm of breast: Secondary | ICD-10-CM

## 2010-08-11 ENCOUNTER — Ambulatory Visit (HOSPITAL_COMMUNITY)
Admission: RE | Admit: 2010-08-11 | Discharge: 2010-08-11 | Disposition: A | Discharge status: Home / Self Care | Payer: BC Managed Care – PPO | Source: Ambulatory Visit | Attending: Internal Medicine | Admitting: Internal Medicine

## 2010-08-11 DIAGNOSIS — Z1231 Encounter for screening mammogram for malignant neoplasm of breast: Secondary | ICD-10-CM

## 2010-08-26 NOTE — Progress Notes (Signed)
  Subjective:    Patient ID: Veronica [name], female    DOB: November 02, 1950, 60 y.o.   MRN: [id number]  HPI Pt cancelled appointment   Review of Systems     Objective:   Physical Exam        Assessment & Plan:   No problem-specific assessment & plan notes found for this encounter.  This encounter was created in error - please disregard.

## 2010-09-15 ENCOUNTER — Ambulatory Visit (INDEPENDENT_AMBULATORY_CARE_PROVIDER_SITE_OTHER): Payer: BC Managed Care – PPO | Admitting: Internal Medicine

## 2010-09-15 ENCOUNTER — Encounter: Payer: Self-pay | Admitting: Internal Medicine

## 2010-09-15 DIAGNOSIS — I2510 Atherosclerotic heart disease of native coronary artery without angina pectoris: Secondary | ICD-10-CM

## 2010-09-15 DIAGNOSIS — I10 Essential (primary) hypertension: Secondary | ICD-10-CM

## 2010-09-15 DIAGNOSIS — E785 Hyperlipidemia, unspecified: Secondary | ICD-10-CM

## 2010-09-15 DIAGNOSIS — F411 Generalized anxiety disorder: Secondary | ICD-10-CM

## 2010-09-15 NOTE — Assessment & Plan Note (Addendum)
Several systolic blood pressure readings in the 180s in the past 2 months, was started on metoprolol in July 2012, blood pressure today 130/80. Ambulatory blood pressure sometimes in the 140s. Plan: Continue with metoprolol (bid as Rx , was taking 1 a day most days), low salt diet (has lost ~ 15 pounds in the last few weeks) ,daily exercises, recheck and return to the office

## 2010-09-15 NOTE — Assessment & Plan Note (Addendum)
Mild, non-obstructive CAD her cardiac CT 06-2010. Will controlled his factors. Already on ASA qd

## 2010-09-15 NOTE — Patient Instructions (Addendum)
Please come back fasting next week: FLP --- dx hyperlipidemia Please get records from the heart doctor

## 2010-09-15 NOTE — Progress Notes (Signed)
  Subjective:    Patient ID: Veronica [name], female    DOB: [birth year], 60 y.o.   MRN: [id number]  HPI Patient noted her blood pressure to be elevated since June 2012, systolic blood pressure around 180. Went to the ER on 07/27/2010 with increased blood pressure, anxiety, dizziness, fatigue. Chart is reviewed, chest x-ray was negative, she had an Escherichia coli UTI which was appropriately treated, d-dimer negative, CKs negative, CBC-potassium-creatinine normal. They also did a cardiac CT:  1.  Mild nonobstructive coronary artery disease.  The patient's  total coronary artery calcium score is 12.62, which is 75th  percentile for patient's matched age and gender.   2.  Nonobstructive calcified plaques at the origin of the LAD, the  proximal LAD, and in the midportion of a large first diagonal/ramus  branch.   3.  Right coronary artery dominance.   4.  Focal area of scarring and bronchiectasis medially in the right  lower lobe.  No other significant non-cardiac findings.  Shortly after, he went to see a cardiologist , Dr Ludger Nutting, he did a stress test, results?. He started her on metoprolol. Since then her blood pressures are slightly better from 130-147/80  Past Medical History  Diagnosis Date  . Allergic rhinitis   . Anxiety and depression   . Hemorrhoids   . Constipation, chronic   . Uterine fibroid   . Low back pain     w/ left side radiculopathy, s/p injection at L4-L5 forarmen 1/06   Past Surgical History  Procedure Date  . Tubal ligation     bilateral 1989  . Abdominal hysterectomy     total abdominal, November 2002 (at [facility])  . 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 Still having headaches, they are better now. She recently has developed hot flashes. No nausea, vomiting, diarrhea. On further questioning, she reports she has been anxious, not sure why; in the remote past she took Valium on an also, more recently her cardiologist prescribed Xanax but she has not tried.      Objective:   Physical Exam  Constitutional: She is oriented to person, place, and time. She appears well-developed and well-nourished.  HENT:  Head: Normocephalic and atraumatic.  Cardiovascular: Normal rate and regular rhythm.   No murmur heard. Pulmonary/Chest: Effort normal and breath sounds normal. No respiratory distress. She has no wheezes. She has no rales.  Musculoskeletal: She exhibits no edema.  Neurological: She is alert and oriented to person, place, and time.  Psychiatric:       Very mild anxiety, no depression noted          Assessment & Plan:

## 2010-09-15 NOTE — Assessment & Plan Note (Addendum)
Counseled  about this issue, I wonder how much of her symptoms are related to anxiety. States anxiety is not daily Options discussed: ssri, xanax as Rx by cards , counseling Will think about her options , for now will take xanax as needed

## 2010-09-15 NOTE — Assessment & Plan Note (Signed)
The patient has moderate hyperlipidemia, will recheck labs.

## 2010-09-18 ENCOUNTER — Other Ambulatory Visit: Payer: Self-pay | Admitting: Internal Medicine

## 2010-09-18 DIAGNOSIS — E785 Hyperlipidemia, unspecified: Secondary | ICD-10-CM

## 2010-09-19 ENCOUNTER — Other Ambulatory Visit (INDEPENDENT_AMBULATORY_CARE_PROVIDER_SITE_OTHER): Payer: BC Managed Care – PPO

## 2010-09-19 DIAGNOSIS — E785 Hyperlipidemia, unspecified: Secondary | ICD-10-CM

## 2010-09-19 LAB — LIPID PANEL
Cholesterol: 202 mg/dL — ABNORMAL HIGH (ref 0–200)
Total CHOL/HDL Ratio: 5
Triglycerides: 78 mg/dL (ref 0.0–149.0)
VLDL: 15.6 mg/dL (ref 0.0–40.0)

## 2010-09-19 LAB — LDL CHOLESTEROL, DIRECT: Direct LDL: 147.3 mg/dL

## 2010-09-19 NOTE — Progress Notes (Signed)
Labs only

## 2010-09-21 ENCOUNTER — Telehealth: Payer: Self-pay | Admitting: Internal Medicine

## 2010-09-21 NOTE — Telephone Encounter (Signed)
Advise patient: Cholesterol better than last year but still needs improvement.  LDL is 147, goal < 130; options: Cont w/ a better diet and recheck in 2 months when she cames back Start lipitor 20 mg qhs

## 2010-09-22 ENCOUNTER — Telehealth: Payer: Self-pay | Admitting: Internal Medicine

## 2010-09-22 MED ORDER — ATORVASTATIN CALCIUM 20 MG PO TABS: 20.0000 mg | ORAL_TABLET | Freq: Every day | ORAL | Status: DC

## 2010-09-22 NOTE — Telephone Encounter (Signed)
Patient requesting lab results from 09/19/10.

## 2010-09-22 NOTE — Telephone Encounter (Signed)
Rx sent to pharmacy for Lipitor. [09/21/10 phone note]

## 2010-09-22 NOTE — Telephone Encounter (Signed)
Rx sent to pharmacy. Patient Informed.

## 2010-09-22 NOTE — Telephone Encounter (Signed)
See phone note from 09-21-10

## 2010-10-03 ENCOUNTER — Telehealth: Payer: Self-pay | Admitting: *Deleted

## 2010-10-03 NOTE — Telephone Encounter (Signed)
Pt c/o having light headache, with intermittent blurry vision and heaviness in arms & legs. Pt believes this to be side effects of Metoprolol and is requesting alternative Rx.

## 2010-10-03 NOTE — Telephone Encounter (Signed)
Phone busy--will try again to reach patient tomorrow.

## 2010-10-03 NOTE — Telephone Encounter (Signed)
Last month, metoprolol was increased from 25 daily to 25 twice a day. She may be taking too much metoprolol. Plan: Decrease metoprolol to 25 mg half tablet twice a day. Keep an eye on her blood pressure,  goal less than 140/85 If symptoms continue let me know If symptoms are severe or worrisome such as severe headache, slurred speech, face weakness: ER and call us

## 2010-10-04 NOTE — Telephone Encounter (Signed)
Patient Informed

## 2010-10-04 NOTE — Telephone Encounter (Signed)
Patient called back  Says bp is 140/84 this morning  --please cal hr back

## 2010-10-08 ENCOUNTER — Emergency Department (HOSPITAL_COMMUNITY): Payer: BC Managed Care – PPO

## 2010-10-08 ENCOUNTER — Encounter (HOSPITAL_COMMUNITY): Payer: Self-pay | Admitting: Radiology

## 2010-10-08 ENCOUNTER — Observation Stay (HOSPITAL_COMMUNITY)
Admission: EM | Admit: 2010-10-08 | Discharge: 2010-10-10 | DRG: 313 | Disposition: A | Discharge status: Home / Self Care | Payer: BC Managed Care – PPO | Attending: Family Medicine | Admitting: Family Medicine

## 2010-10-08 DIAGNOSIS — R072 Precordial pain: Secondary | ICD-10-CM

## 2010-10-08 DIAGNOSIS — K5900 Constipation, unspecified: Secondary | ICD-10-CM | POA: Diagnosis present

## 2010-10-08 DIAGNOSIS — R51 Headache: Secondary | ICD-10-CM | POA: Diagnosis present

## 2010-10-08 DIAGNOSIS — E785 Hyperlipidemia, unspecified: Secondary | ICD-10-CM | POA: Diagnosis present

## 2010-10-08 DIAGNOSIS — R0602 Shortness of breath: Secondary | ICD-10-CM | POA: Insufficient documentation

## 2010-10-08 DIAGNOSIS — Z79899 Other long term (current) drug therapy: Secondary | ICD-10-CM | POA: Insufficient documentation

## 2010-10-08 DIAGNOSIS — F411 Generalized anxiety disorder: Secondary | ICD-10-CM | POA: Diagnosis present

## 2010-10-08 DIAGNOSIS — F329 Major depressive disorder, single episode, unspecified: Secondary | ICD-10-CM | POA: Diagnosis present

## 2010-10-08 DIAGNOSIS — R079 Chest pain, unspecified: Secondary | ICD-10-CM | POA: Diagnosis present

## 2010-10-08 DIAGNOSIS — I10 Essential (primary) hypertension: Secondary | ICD-10-CM | POA: Diagnosis present

## 2010-10-08 DIAGNOSIS — J984 Other disorders of lung: Secondary | ICD-10-CM | POA: Insufficient documentation

## 2010-10-08 DIAGNOSIS — I079 Rheumatic tricuspid valve disease, unspecified: Secondary | ICD-10-CM | POA: Insufficient documentation

## 2010-10-08 DIAGNOSIS — F3289 Other specified depressive episodes: Secondary | ICD-10-CM | POA: Diagnosis present

## 2010-10-08 DIAGNOSIS — Z90710 Acquired absence of both cervix and uterus: Secondary | ICD-10-CM | POA: Insufficient documentation

## 2010-10-08 LAB — CBC
HCT: 38 % (ref 36.0–46.0)
Hemoglobin: 13.4 g/dL (ref 12.0–15.0)
MCHC: 35.3 g/dL (ref 30.0–36.0)
RBC: 4.47 MIL/uL (ref 3.87–5.11)
WBC: 7 10*3/uL (ref 4.0–10.5)

## 2010-10-08 LAB — COMPREHENSIVE METABOLIC PANEL
ALT: 19 U/L (ref 0–35)
AST: 18 U/L (ref 0–37)
Albumin: 4.3 g/dL (ref 3.5–5.2)
CO2: 26 mEq/L (ref 19–32)
Chloride: 101 mEq/L (ref 96–112)
Creatinine, Ser: 0.59 mg/dL (ref 0.50–1.10)
GFR calc non Af Amer: >60 mL/min (ref >60)
Sodium: 136 mEq/L (ref 135–145)
Total Bilirubin: 0.6 mg/dL (ref 0.3–1.2)

## 2010-10-08 LAB — CK TOTAL AND CKMB (NOT AT ARMC)
CK, MB: 1.7 ng/mL (ref 0.3–4.0)
Total CK: 62 U/L (ref 7–177)

## 2010-10-08 LAB — URINALYSIS, ROUTINE W REFLEX MICROSCOPIC
Bilirubin Urine: NEGATIVE
Glucose, UA: NEGATIVE mg/dL
Hgb urine dipstick: NEGATIVE
Ketones, ur: NEGATIVE mg/dL
Protein, ur: NEGATIVE mg/dL
pH: 6 (ref 5.0–8.0)

## 2010-10-08 LAB — DIFFERENTIAL
Basophils Absolute: 0 10*3/uL (ref 0.0–0.1)
Basophils Relative: 0 % (ref 0–1)
Lymphocytes Relative: 29 % (ref 12–46)
Monocytes Absolute: 0.3 10*3/uL (ref 0.1–1.0)
Neutro Abs: 4.6 10*3/uL (ref 1.7–7.7)
Neutrophils Relative %: 66 % (ref 43–77)

## 2010-10-08 LAB — TROPONIN I: Troponin I: <0.3 ng/mL (ref <0.30)

## 2010-10-08 LAB — PROTIME-INR
INR: 1.07 (ref 0.00–1.49)
Prothrombin Time: 14.1 seconds (ref 11.6–15.2)

## 2010-10-08 LAB — URINE MICROSCOPIC-ADD ON

## 2010-10-08 MED ORDER — IOHEXOL 300 MG/ML  SOLN
80.0000 mL | Freq: Once | INTRAMUSCULAR | Status: AC | PRN
  Administered 2010-10-08: 80 mL via INTRAVENOUS

## 2010-10-09 DIAGNOSIS — I369 Nonrheumatic tricuspid valve disorder, unspecified: Secondary | ICD-10-CM

## 2010-10-09 DIAGNOSIS — R079 Chest pain, unspecified: Secondary | ICD-10-CM

## 2010-10-09 LAB — CBC
HCT: 36.5 % (ref 36.0–46.0)
MCH: 29.3 pg (ref 26.0–34.0)
MCV: 86.3 fL (ref 78.0–100.0)
RBC: 4.23 MIL/uL (ref 3.87–5.11)
WBC: 5.8 10*3/uL (ref 4.0–10.5)

## 2010-10-09 LAB — BASIC METABOLIC PANEL
BUN: 7 mg/dL (ref 6–23)
CO2: 27 mEq/L (ref 19–32)
Calcium: 9.6 mg/dL (ref 8.4–10.5)
Chloride: 103 mEq/L (ref 96–112)
Creatinine, Ser: 0.63 mg/dL (ref 0.50–1.10)
Glucose, Bld: 83 mg/dL (ref 70–99)

## 2010-10-09 LAB — HEMOGLOBIN A1C
Hgb A1c MFr Bld: 5.6 % (ref <5.7)
Mean Plasma Glucose: 114 mg/dL (ref <117)

## 2010-10-09 LAB — CARDIAC PANEL(CRET KIN+CKTOT+MB+TROPI): Relative Index: INVALID (ref 0.0–2.5)

## 2010-10-09 NOTE — Consult Note (Signed)
Veronica [name], [name] NO.:  [id number]  MEDICAL RECORD NO.:  [id number]  LOCATION:  1417                         FACILITY:  [facility]  PHYSICIAN:  Pricilla Riffle, MD, FACCDATE OF BIRTH:  04-30-1950  DATE OF CONSULTATION:  10/09/2010 DATE OF DISCHARGE:                                CONSULTATION   IDENTIFICATION:  Veronica [name] is a 60 year old who we are asked to see regarding chest pain.  HISTORY OF PRESENT ILLNESS:  The patient has no known history of coronary artery disease.  This summer, she was put on metoprolol for high blood pressure.  She complained of fatigue, muscle pain, shortness of breath, headache, diaphoresis, neck pain with this.  She cut the dose in half, did not help.  Lipitor was added because of dyslipidemia.  No new symptoms developed.  She cut her metoprolol again to a quarter on October 06, 2010.  On October 06, 2010, she noted the onset of chest pain, was a tightness radiating to the neck, bilateral hands.  Her feet felt cold.  She became dizzy, sat down, symptoms improved slightly, took an aspirin 500 mg. Signs restarted later that day, better with lying on the right side, better with deep inspiration, better with Xanax.  On Saturday prior to admission, she had 5 or 6 episodes, she took an aspirin 81 mg, each episode lasted about 1 hour, not exertional.  Yesterday in church, she had an episode of chills.  EMS was called.  She refused to go.  Second episode and the friend brought her to the ER.  She was given 81 mg aspirin x4 with relief.  Currently, she describes some mild chest pressure.  No shortness of breath.  ALLERGIES:  None though question intolerance to METOPROLOL.  MEDICATIONS:  Norvasc 5, aspirin 325, vitamin D, Lovenox subcu prophylaxis dose, Protonix, and Crestor 20.  PAST MEDICAL HISTORY: 1. Hypertension. 2. Dyslipidemia. 3. Obesity. 4. History of anxiety/depression.  PAST SURGICAL HISTORY:  Status post TAH,  BTL.  SOCIAL HISTORY:  The patient lives in [place].  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 She does not smoke, does not drink.  FAMILY HISTORY:  Mother died at age 58, killed.  Father died at age 58, history of CAD.  REVIEW OF SYSTEMS:  No fevers sensation.  No cough.  No injury.  Had some back pain, chronic constipation.  Otherwise all systems reviewed and negative to the above problem except as noted.  Note, lipids done on September 19, 2010, LDL was 147, HDL 43, triglycerides 78, total cholesterol 202 (started on Lipitor with this).  PHYSICAL EXAMINATION:  GENERAL:  The patient currently in no acute distress though appears a little anxious. VITAL SIGNS:  Blood pressure 130/68, pulse is 64 and regular, temperature is 97.6. HEENT:  Normocephalic, atraumatic.  EOMI.  PERRL.  Mucous membranes are moist. NECK:  JVP is normal without thyromegaly or bruits. LUNGS:  Clear to auscultation without rales or wheezes. CARDIAC:  Regular rate and rhythm.  S1 and S2.  No S3, S4, or murmurs. PMI not displaced. CHEST:  Tender to palpation, brings on discomfort makes it worse. ABDOMEN:  Supple, nontender.  No hepatomegaly.  Normal bowel sounds.  No masses. EXTREMITIES:  Good distal pulses throughout.  No lower extremity edema.  Chest x-ray shows mild chronic scarring, no acute disease.  CTA shows no PE.  Lingular subsegmental atelectasis.  12-lead EKG shows sinus rhythm 61 beats per minute, nonspecific ST-T wave changes.  LABORATORY DATA:  Hemoglobin of 12.4, WBC of 5.8, BUN and creatinine of 7 and 0.63, potassium of 4, CK-MB/troponin negative x3.  IMPRESSION:  The patient is a 60 year old with a history of hypertension and dyslipidemia presents with chest pain.  Pain is very atypical, not for angina.  Occurs without activity.  There is some effect with inspiration, now worsening with inspiration.  On exam, I can bring on or make worse the discomfort that she is feeling.  I Veronica not convinced again that the  above symptoms are cardiac.  We will review echo, if normal I think she is okay to discharge.  We will treat with NSAIDs or aspirin.  Dyslipidemia.  Will need to be followed as an outpatient.  Hypertension.  Will need to be followed as an outpatient.  I have encouraged the patient to continue to watch her diet and exercise.     Pricilla Riffle, MD, [facility]     PVR/MEDQ  D:  10/09/2010  T:  10/09/2010  Job:  [id number]

## 2010-10-10 LAB — BASIC METABOLIC PANEL
BUN: 9 mg/dL (ref 6–23)
CO2: 26 mEq/L (ref 19–32)
Chloride: 102 mEq/L (ref 96–112)
GFR calc Af Amer: >60 mL/min (ref >60)
Glucose, Bld: 86 mg/dL (ref 70–99)
Potassium: 3.6 mEq/L (ref 3.5–5.1)

## 2010-10-10 LAB — CBC
HCT: 35.8 % — ABNORMAL LOW (ref 36.0–46.0)
Hemoglobin: 12.4 g/dL (ref 12.0–15.0)
RBC: 4.2 MIL/uL (ref 3.87–5.11)
RDW: 13.2 % (ref 11.5–15.5)
WBC: 7.3 10*3/uL (ref 4.0–10.5)

## 2010-10-11 ENCOUNTER — Telehealth: Payer: Self-pay | Admitting: Internal Medicine

## 2010-10-11 NOTE — Telephone Encounter (Signed)
Okay to overbook me 9-19 or 9-20

## 2010-10-11 NOTE — Telephone Encounter (Signed)
Patient called to make hosp followup appt-----can only come on Wed or Thursday because her son has to drive her--entered appt for her in 2 weeks on Wed 9/26 at 2:00, but she insisted that hosp says to followup on Wed 9/19 or Thur 9/20---based on her hosp visit notes, would it be OK to wait until 9/26??  Or  do you want me to keep the  Wed 9/19 which gives you five 30 min appts in a row

## 2010-10-12 NOTE — Telephone Encounter (Signed)
Noted  

## 2010-10-12 NOTE — Telephone Encounter (Signed)
Patient will see you on 9/19 at 2:30

## 2010-10-18 ENCOUNTER — Ambulatory Visit (INDEPENDENT_AMBULATORY_CARE_PROVIDER_SITE_OTHER): Payer: BC Managed Care – PPO | Admitting: Internal Medicine

## 2010-10-18 ENCOUNTER — Encounter: Payer: Self-pay | Admitting: Internal Medicine

## 2010-10-18 DIAGNOSIS — I2510 Atherosclerotic heart disease of native coronary artery without angina pectoris: Secondary | ICD-10-CM

## 2010-10-18 DIAGNOSIS — F411 Generalized anxiety disorder: Secondary | ICD-10-CM

## 2010-10-18 DIAGNOSIS — E785 Hyperlipidemia, unspecified: Secondary | ICD-10-CM

## 2010-10-18 DIAGNOSIS — I10 Essential (primary) hypertension: Secondary | ICD-10-CM

## 2010-10-18 NOTE — Assessment & Plan Note (Addendum)
Metoprolol was switched to amlodipine while at the hospital, since then she is feeling great and blood pressure is well controlled. On looking back, the patient thinks that she did very poorly with metoprolol (fatigue, headache, anxiety) Continuing present care

## 2010-10-18 NOTE — Progress Notes (Signed)
  Subjective:    Patient ID: Veronica [name], female    DOB: Jun 23, 1950, 60 y.o.   MRN: [id number]  HPI Followup from the hospital, was admitted with chest pain few days ago : echocardiogram, chest x-ray and CT angiogram were normal or negative. She saw cardiology, was okay to be discharged home. Hospital records reviewed, see assessment and plan  Past Medical History  Diagnosis Date  . Allergic rhinitis   . Anxiety and depression   . Hemorrhoids   . Constipation, chronic   . Uterine fibroid   . Low back pain     w/ left side radiculopathy, s/p injection at L4-L5 forarmen 1/06   Past Surgical History  Procedure Date  . Tubal ligation     bilateral 1989  . Abdominal hysterectomy     total abdominal, November 2002 (at [facility])  . Leep     unknown date      Review of Systems Since she left the hospital is feeling much better. Chest pain has decreased. Denies any GERD type symptoms, no dysphagia or odynophagia, no shortness of breath. Her metoprolol was switched to amlodipine, she feels much better and the blood pressure at home is within normal. On looking back, she believes that she felt poorly since she was started on metoprolol a few months ago (fatigue, headache, anxiety, chest pain). She is taking Lipitor with no apparent side effects Anxiety has decreased significantly, essentially not taking any Xanax.     Objective:   Physical Exam  Constitutional: She is oriented to person, place, and time. She appears well-developed and well-nourished.  HENT:  Head: Normocephalic and atraumatic.  Cardiovascular: Normal rate, regular rhythm and normal heart sounds.   No murmur heard. Pulmonary/Chest: Effort normal and breath sounds normal. No respiratory distress. She has no wheezes. She has no rales. She exhibits no tenderness.  Musculoskeletal: She exhibits no edema.  Neurological: She is alert and oriented to person, place, and time.  Psychiatric: She has a normal mood and  affect. Her behavior is normal. Judgment and thought content normal.          Assessment & Plan:

## 2010-10-18 NOTE — Assessment & Plan Note (Signed)
Definitely improved 

## 2010-10-18 NOTE — Patient Instructions (Signed)
Continue present medications Will schedule a stress test Come back in 2 months, fasting

## 2010-10-18 NOTE — Assessment & Plan Note (Signed)
Started atorvastatin based on the last cholesterol panel, good compliance and tolerance. Labs on return to the office

## 2010-10-18 NOTE — Assessment & Plan Note (Addendum)
Mild nonobstructive CAD on  cardiac CT 06-2010. Now status post an admission for atypical chest pain. Echocardiogram was normal, CT of the chest showed no PE Patient is doing better, to be sure we'll do a stress test. We will continue with risk factor modification.

## 2010-10-19 ENCOUNTER — Telehealth: Payer: Self-pay | Admitting: Internal Medicine

## 2010-10-19 NOTE — Telephone Encounter (Signed)
In reference to your Order for Rest Stress Myoview, State [employer]BS will require Peer to Peer review for approval of this test.  Please call [phone], Option 2, the patient's ID is the Case # of [id number].  From today there are only 3 business days to place this call.  Call anytime between 7am & 6pm Mon-Fri.

## 2010-10-20 NOTE — Telephone Encounter (Signed)
PATIENT STRESS TEST IS SCHEDULED & PATIENT HAS BEEN NOTIFIED.

## 2010-10-20 NOTE — Telephone Encounter (Signed)
DR. Drue Novel, [employer] JUST CALLED TO STATE THEY ARE STILL WAITING ON YOU TO CALL THEM TO DISCUSS APPROVAL FOR THIS PATIENT'S STRESS TEST.  (You only have until Monday, 10-23-10 by 4pm) to return their call before they cancel the request.

## 2010-10-20 NOTE — Telephone Encounter (Signed)
Study authorization: [id number] needs to be done at [facility]

## 2010-10-23 ENCOUNTER — Telehealth: Payer: Self-pay | Admitting: Cardiology

## 2010-10-23 ENCOUNTER — Telehealth: Payer: Self-pay | Admitting: Internal Medicine

## 2010-10-23 NOTE — Telephone Encounter (Signed)
Patient informed-NO lightheadedness, dizziness, SOB, blurred vision, pain in left arm, faintness Advised patient to use any calming techniques that she would normally use to relax [i.e. Reading, walking, breathing techniques, soothing music] to take away any anxiety that may be exacerbating her symptoms. Pt understands to seek medical attention at ED if she should develop any severe symptoms prior to stress test.

## 2010-10-23 NOTE — Telephone Encounter (Signed)
Patient wanted dr Drue Novel to know she had chest pain Sunday [id number] - she said she has a mild pain today & fatigue  - she said she usually get the pain in the afternoon - she has stress test scheduled

## 2010-10-23 NOTE — Telephone Encounter (Signed)
ER if sx severe, will wait for stress test

## 2010-10-24 ENCOUNTER — Ambulatory Visit: Payer: BC Managed Care – PPO | Admitting: Internal Medicine

## 2010-10-25 ENCOUNTER — Ambulatory Visit: Payer: BC Managed Care – PPO | Admitting: Internal Medicine

## 2010-10-25 ENCOUNTER — Encounter: Payer: Self-pay | Admitting: Internal Medicine

## 2010-10-25 ENCOUNTER — Ambulatory Visit (INDEPENDENT_AMBULATORY_CARE_PROVIDER_SITE_OTHER): Payer: BC Managed Care – PPO | Admitting: Internal Medicine

## 2010-10-25 VITALS — BP 110/62 | HR 70 | Temp 98.3°F | Resp 14 | Wt 197.0 lb

## 2010-10-25 DIAGNOSIS — K219 Gastro-esophageal reflux disease without esophagitis: Secondary | ICD-10-CM | POA: Insufficient documentation

## 2010-10-25 MED ORDER — PANTOPRAZOLE SODIUM 40 MG PO TBEC: 40.0000 mg | DELAYED_RELEASE_TABLET | Freq: Every day | ORAL | Status: DC

## 2010-10-25 NOTE — Patient Instructions (Signed)
Stop motrin or motrin like meds protonix before breakfast

## 2010-10-25 NOTE — Progress Notes (Signed)
  Subjective:    Patient ID: Veronica [name], female    DOB: [birth year], 60 y.o.   MRN: [id number]  HPI Since her last office visit, she continue with chest pain, she has several observations that made her think problem is GI related: Symptoms increase by eating  dairy products, they decrease when she uses 3 pillows at night and  eventually last night she took an antiacid with almost an immediate relief of the chest pain. No history of peptic ulcer disease in the past,, no previous EGD or chronic GERD type symptoms.  Past Medical History  Diagnosis Date  . Allergic rhinitis   . Anxiety and depression   . Hemorrhoids   . Constipation, chronic   . Uterine fibroid   . Low back pain     w/ left side radiculopathy, s/p injection at L4-L5 forarmen 1/06   Past Surgical History  Procedure Date  . Tubal ligation     bilateral 1989  . Abdominal hysterectomy     total abdominal, November 2002 (at [facility])  . Leep     unknown date      Review of Systems Denies dysphasia or odynophagia She does have some upper abdominal discomfort for an early satiety No classic pyrosis No nausea, vomiting, blood in the stools. Motrin is in her med list but she has not been taking it lately Anxiety well controlled No dysuria or gross hematuria    Objective:   Physical Exam  Constitutional: She is oriented to person, place, and time. She appears well-developed and well-nourished. No distress.  Cardiovascular: Normal rate, regular rhythm and normal heart sounds.   No murmur heard. Pulmonary/Chest: Effort normal and breath sounds normal. No respiratory distress. She has no wheezes. She has no rales.  Abdominal: Soft. Bowel sounds are normal. There is no rebound.       Mild epigastric tenderness that extends a little bit to the left. No mass or rebound.  Musculoskeletal: She exhibits no edema.  Neurological: She is alert and oriented to person, place, and time.  Skin: She is not diaphoretic.           Assessment & Plan:

## 2010-10-25 NOTE — Assessment & Plan Note (Addendum)
See HPI, I agree that CP may be GI in origin; she has no chronic GERD sx but the onset of her problems has been at age 60 thus will need a GI evaluation Plan: PPI and GERD prevention education (provided) U/S  GI ref: EGD ?

## 2010-10-30 ENCOUNTER — Ambulatory Visit
Admission: RE | Admit: 2010-10-30 | Discharge: 2010-10-30 | Disposition: A | Discharge status: Home / Self Care | Payer: BC Managed Care – PPO | Source: Ambulatory Visit | Attending: Internal Medicine | Admitting: Internal Medicine

## 2010-10-30 ENCOUNTER — Other Ambulatory Visit: Payer: BC Managed Care – PPO

## 2010-10-30 ENCOUNTER — Other Ambulatory Visit: Payer: Self-pay | Admitting: Internal Medicine

## 2010-10-30 DIAGNOSIS — K219 Gastro-esophageal reflux disease without esophagitis: Secondary | ICD-10-CM

## 2010-11-02 NOTE — Discharge Summary (Signed)
Veronica [name], Veronica [name] NO.:  [id number]  MEDICAL RECORD NO.:  [id number]  LOCATION:  1417                         FACILITY:  [facility]  PHYSICIAN:  Ramiro Harvest, MD    DATE OF BIRTH:  [birth year]  DATE OF ADMISSION:  10/08/2010 DATE OF DISCHARGE:  10/10/2010                        DISCHARGE SUMMARY    PRIMARY CARE PHYSICIAN:  Willow Ora, MD, of [facility].  DISCHARGE DIAGNOSES: 1. Chest pain, resolved. 2. Hypertension. 3. Anxiety. 4. Hyperlipidemia. 5. Chronic constipation. 6. History of depression.  DISCHARGE MEDICATIONS: 1. Norvasc 5 mg p.o. daily. 2. Ibuprofen 600 mg p.o. t.i.d. for 5 days, then stop. 3. Tylenol 650 mg p.o. q.4 h. p.r.n. 4. Atorvastatin 20 mg p.o. nightly. 5. Os-Cal with vitamin D 1 tablet p.o. daily. 6. Vitamin B12 1 tablet p.o. daily. 7. Xanax 0.25 mg half a tablet p.o. daily p.r.n.  DISPOSITION AND FOLLOWUP:  Veronica [name] will be discharged home.  Veronica [name] is to follow up with PCP 1 week postdischarge.  On followup, Veronica [name]'s chest pain will need to be reassessed and will be deferred to PCP as to whether to schedule [name] for outpatient stress test.  Veronica [name] will need repeat thyroid function studies done as TSH which was obtained during this hospitalization was minimally elevated at 0.309 and as such, Veronica [name] will need repeat TFTs done in about 4-6 weeks.  Veronica [name]'s blood pressure medication was discontinued.  She was started on Norvasc 5 mg daily which she tolerated during this hospitalization. Veronica [name] had stated that she was unable to tolerate metoprolol at home due to side effects and as such was not taking them as required and was only taking quarter of a tablet.  CONSULTATIONS DONE:  A cardiology consultation was done.  Veronica [name] was seen in consultation by Dr. Dietrich Pates on October 09, 2010.  PROCEDURES PERFORMED:  A chest x-ray was done, October 08, 2010, that showed mild chronic  pulmonary scarring, no active process evident.  CT angiogram of Veronica chest was done, October 08, 2010, that showed no filling defects identified in Veronica pulmonary artery tree to suggest pulmonary embolus, linear subsegmental atelectasis in Veronica lingula.  A 2- D echo was done on October 09, 2010, that showed left ventricle with vigorous systolic function and EF of 65% to 70%.  BRIEF ADMISSION HISTORY AND PHYSICAL:  Veronica [name] is a pleasant 60- year-old [place] female with history of hypertension, hyperlipidemia, and anxiety who presented to Veronica ED with a 2-day history of worsening substernal chest pain.  Veronica [name] stated that she had been having substernal chest pain that had been worsened over Veronica past couple of days with radiation to Veronica neck, described as a pressure, lasting approximately 30 minutes to 1 hour, and intermittent in nature.  Veronica [name]'s last episode occurred during church on Veronica day of admission. Veronica [name] did endorse some chills, palpitations, shortness of breath, and a headache.  Veronica [name] denied any diaphoresis, no paroxysmal nocturnal dyspnea, no orthopnea, no fever, no cough, no diarrhea, no constipation, no dysuria, no nausea, no vomiting.  Veronica [name] was seen in Veronica ED, given some aspirin with some improvement in  her chest pain, but Veronica [name] stated that she had been having intermittent chest pain since July of 2012.  Veronica [name] had recently traveled to [place] in June 2012.  In Veronica ED, EKG which was done showed a normal sinus rhythm. Chest x-ray was without any acute cardiopulmonary abnormality.  CBC was within normal limits.  CMET was unremarkable.  We were called to admit Veronica [name] for further evaluation and management.  Veronica [name] did state that she had not been taking her metoprolol as prescribed due to its multiple side effects and that included fatigue, feeling uncomfortable and as such she was down to taking a quarter of a tablet twice a  day.  However, she was still having some headaches as well.  Veronica [name] on Veronica time of admission was chest pain free.  For Veronica rest of admission history and physical, please see H and P dictated per Dr. Janee Morn of job [phone].  HOSPITAL COURSE: 1. Chest pain.  Veronica [name] was admitted with chest pain.  Veronica [name]     did have multiple risk factors of hypertension, hyperlipidemia,     being female age over 49.  Veronica [name] had also recently traveled     to [place] which was a 7-hour flight and as such there was some     concern for PE.  CT angiogram of Veronica chest was done which was     negative for PE.  Veronica [name] was placed on telemetry.  Cardiac     enzymes were cycled q.8 h. x3 which were negative.  A 2-D echo was     obtained with results as stated above.  Magnesium level was also     obtained which was normal.  TSH was also obtained which was     minimally elevated.  Veronica [name] had had a recent LDL done in     August 2012 and as such a fasting lipid panel was not done.  LDL at     that time was 147.3 and Veronica [name] was on atorvastatin.  Veronica     [name] was placed on oxygen, aspirin, and started on Norvasc and     morphine sulfate and nitroglycerin as needed.  She was monitored     and followed during Veronica hospitalization.  A1c which was obtained     came back at 5.6.  Due to Veronica [name]'s multiple risk factors, a     cardiology consultation was obtained.  Veronica [name] was seen in     consultation by Dr. Tenny Craw on October 09, 2010, at which point in     time it was felt per Cardiology Veronica [name]'s chest pain was     atypical and it was felt that if [name]'s 2-D echo was okay and     normal, she was okay for discharge.  It was recommended to place     Veronica [name] on NSAIDs and as such Veronica [name] was started on     ibuprofen 600 mg 3 times daily.  Veronica [name] will be treated with     scheduled NSAIDs for 5 days.  Veronica [name] improved clinically such     that by day of discharge, her  chest pain had resolved.  Veronica [name]     will be discharged in stable and improved condition. 2. Hypertension.  Veronica [name] was on metoprolol at home for her     hypertension, however, due to its multiple side effects, she was  not taking it as scheduled and was taking only a quarter twice     daily.  Veronica [name]'s metoprolol subsequently discontinued due to     Veronica side effects which was affecting Veronica [name] taking Veronica     medication.  Veronica [name] was started on Norvasc 5 mg daily.  She     had good control of her blood pressure and as such will be     discharged home on Norvasc 5 mg daily.  Veronica [name] will follow up     with PCP as an outpatient.  Veronica rest of Veronica [name]'s chronic     medical issues remained stable throughout Veronica hospitalization and     Veronica [name] will be discharged in stable and improved condition.     On day of discharge, vital signs temperature 97.7, pulse of 58,     respirations 16, blood pressure 125/73, saturating 98% on room air.  DISCHARGE LABORATORY DATA:  Sodium 137, potassium 3.6, chloride 102, bicarbonate 26, glucose 86, BUN 9, creatinine 0.66, calcium of 9.3.  CBC with a white count of 7.3, hemoglobin 12.4, hematocrit 35.8 with a platelet count of 196.  It has been a pleasure taking care of Veronica [name].     Ramiro Harvest, MD     DT/MEDQ  D:  10/10/2010  T:  10/10/2010  Job:  [id number]  cc:   Willow Ora, MD [phone] [address] [place], [place] [address]  Pricilla Riffle, MD, [facility] [address]  [address] [place] [address]  Electronically Signed by Ramiro Harvest MD on 11/02/2010 12:28:38 PM

## 2010-11-02 NOTE — H&P (Signed)
NAMESHARILYNN, CASSITY NO.:  [id number]  MEDICAL RECORD NO.:  [id number]  LOCATION:  WLED                         FACILITY:  [facility]  PHYSICIAN:  Ramiro Harvest, MD    DATE OF BIRTH:  February 24, 1950  DATE OF ADMISSION:  10/08/2010 DATE OF DISCHARGE:                             HISTORY & PHYSICAL   PRIMARY CARE PHYSICIAN:  Dr. Willow Ora, [facility].  CHIEF COMPLAINT:  Chest pain.  HISTORY OF PRESENT ILLNESS:  Veronica [name] is a pleasant 60 year old [place] female, history of hypertension, hyperlipidemia, and anxiety, presenting to the ED with a 2-day history of worsening substernal chest pain.  The patient states that she has been having substernal chest pain that has been worsening over the past 2 days with radiation to the neck, described as a pressure and lasting approximately 30 minutes to 1 hour and intermittent in nature.  The patient's last episode occurred during churched on the day of admission.  The patient does endorse some chills, palpitation, shortness of breath, and a headache.  The patient denies any diaphoresis.  No paroxysmal nocturnal dyspnea.  No orthopnea, no fever, no cough.  No diarrhea, no constipation, no dysuria.  No nausea, no vomiting.  The patient was seen in the ED, given an aspirin with improvement in her chest pain.  The patient states that she has been having intermittent chest pain since July 2012.  The patient also recently travelled to [place] in June 2012.  In the ED, EKG which was done was in normal sinus rhythm.  Chest x-ray was without any acute cardiopulmonary abnormality.  CBC was within normal limits.  CMET was unremarkable.  We were called to admit the patient for further evaluation and management.  The patient does state that she has not been taking her metoprolol as prescribed due to its multiple side effects that she had been having including fatigue, feeling uncomfortable, and as such that she is down to taking a  quarter twice a day; however, still having some headaches.  The patient is currently chest pain free.  PAST MEDICAL HISTORY: 1. Hyperlipidemia. 2. Anxiety. 3. Depression. 4. Hypertension. 5. Status post total abdominal hysterectomy secondary to symptomatic     uterine fibroids, November 2002. 6. Status post tubal ligation. 7. Status post LEEP. 8. Allergic rhinitis. 9. Chronic constipation. 10.Hemorrhoids. 11.History of low back pain with left radiculopathy, status post     injection.  HOME MEDICATIONS: 1. Os-Cal with vitamin D 1 tablet p.o. daily. 2. Vitamin B12, 1 tablet p.o. daily. 3. Xanax 0.25 mg half a tablet daily as needed. 4. Atorvastatin 20 mg p.o. q.h.s. 5. Metoprolol 25 mg a quarter of a tablet twice daily.  SOCIAL HISTORY:  The patient is divorced.  No tobacco use.  No alcohol use.  No IV drug use.  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 She is currently divorced and is originally from [place].  The patient has 3 children.  FAMILY HISTORY:  Mother deceased at age 9 from homicide.  Father deceased at age 60 from coronary artery disease.  The patient denies any premature coronary artery disease.  REVIEW OF SYSTEMS:  As per HPI, otherwise  negative.  PHYSICAL EXAMINATION:  VITAL SIGNS:  Temperature 98.6, blood pressure 135/77, pulse of 63, respirations 16, and saturating 98% on room air. GENERAL:  The patient is well-developed and well-nourished female in no acute cardiopulmonary distress. HEENT:  Normocephalic and atraumatic.  Pupils equal, round, and reactive to light and accommodation.  Extraocular movements intact.  Oropharynx is clear.  No lesions, no exudates. NECK:  Supple.  No lymphadenopathy. RESPIRATORY:  Lungs are clear to auscultation bilaterally.  No wheezes, no crackles, no rhonchi. CARDIOVASCULAR:  Regular rate and rhythm.  No murmurs, rubs, or gallops. ABDOMEN:  Soft, nontender, and nondistended.  Positive bowel sounds. EXTREMITIES:  No  clubbing, cyanosis, or edema. NEUROLOGICAL:  The patient is alert and oriented x3.  Cranial nerves II through XII are grossly intact with no focal deficits.  ADMISSION LABORATORY DATA:  CMET, sodium 136, potassium 3.8, chloride 101, bicarbonate 26, glucose 95, BUN 8, creatinine 0.59, bilirubin 0.6, alkaline phosphatase 71, AST 18, ALT 19, protein 7.8, albumin 4.3, calcium of 10.1, PT of 14.1, and INR of 1.07.  CBC with a white count of 7, hemoglobin 13.4, hematocrit 38.0, platelet count of 205, and ANC of 4.6.  Urinalysis is yellow, clear, specific gravity 1.006, pH of 6, glucose negative, bilirubin negative, ketones negative, blood negative, protein negative, urobilinogen 0.2, nitrites negative, leukocytes small. Urine microscopy, squamous epithelial cells rare.  Point of care troponin I 0.00.  EKG shows a normal sinus rhythm.  Chest x-ray shows mild chronic pulmonary scarring.  No active process is evident.  ASSESSMENT AND PLAN:  Ms. Veronica [name] is a 60 year old female with history of hypertension and hyperlipidemia, who presents to the ED with some intermittent chest pain worsening over the past 2 days, described more as a pressure radiation into the neck. 1. Chest pain.  Differential includes acute coronary syndrome.  The     patient does have risk factors of hypertension, hyperlipidemia,     age, and being a female and age of 77 versus pulmonary embolism.     The patient with a recent long distance travel to [place] and since     coming back, has been having this intermittent chest pain versus     gastrointestinal.  We will admit the patient to Telemetry, cycle     cardiac enzymes q.8 h. x3, check a 2-D echo, check a magnesium,     check a TSH, check a hemoglobin A1c, check a CT angiogram to rule     out pulmonary embolism.  The patient with recent LDL, checked in     August 2012 with LDL of 147.3 and as such we will not repeat a     fasting lipid panel.  The patient had some  intolerance to beta-     blockers and as such we will hold off on placing on a beta-blocker.     We will place on oxygen, aspirin, start a calcium channel blocker,     morphine sulfate, and nitroglycerin as needed for chest pain.  We     will also place on a proton pump inhibitor.  We will follow and     monitor.  Depending on cardiac enzymes and 2-D echo, may need a     cardiology consult for inpatient versus outpatient stress test.  We     will follow for now. 2. Hypertension.  The patient is intolerant to her beta-blocker and as     such had continued to decrease the doses.  However, still  complaining of headache and would rather not be placed back on it.     We will start the patient on Norvasc 5 mg daily for blood pressure     control and monitor. 3. Hyperlipidemia.  We will increase home dose atorvastatin to 40 mg     daily. 4. Chronic constipation. 5. Prophylaxis.  Protonix for gastrointestinal prophylaxis.  Lovenox     for deep venous thrombosis prophylaxis.  It has been a pleasure taking care of Ms. Veronica [name].     Ramiro Harvest, MD     DT/MEDQ  D:  10/08/2010  T:  10/08/2010  Job:  [id number]  cc:   Willow Ora, MD [phone] [address] [place], [place] [address]  Electronically Signed by Ramiro Harvest MD on 11/02/2010 12:28:50 PM

## 2010-11-06 ENCOUNTER — Encounter (HOSPITAL_COMMUNITY): Payer: BC Managed Care – PPO | Admitting: Radiology

## 2010-11-07 ENCOUNTER — Other Ambulatory Visit: Payer: Self-pay | Admitting: Gastroenterology

## 2010-11-07 ENCOUNTER — Other Ambulatory Visit: Payer: Self-pay | Admitting: Internal Medicine

## 2010-11-07 DIAGNOSIS — R1013 Epigastric pain: Secondary | ICD-10-CM

## 2010-11-07 MED ORDER — AMLODIPINE BESYLATE 5 MG PO TABS: 5.0000 mg | ORAL_TABLET | Freq: Every day | ORAL | Status: DC

## 2010-11-07 NOTE — Telephone Encounter (Signed)
Done

## 2010-11-07 NOTE — Telephone Encounter (Signed)
refil lnorvasc - [facility] [address] - med was prescribed in ed

## 2010-11-08 ENCOUNTER — Ambulatory Visit (HOSPITAL_COMMUNITY): Payer: BC Managed Care – PPO | Attending: Internal Medicine | Admitting: Radiology

## 2010-11-08 ENCOUNTER — Telehealth: Payer: Self-pay | Admitting: *Deleted

## 2010-11-08 ENCOUNTER — Encounter: Payer: Self-pay | Admitting: Internal Medicine

## 2010-11-08 ENCOUNTER — Encounter: Payer: Self-pay | Admitting: Cardiology

## 2010-11-08 DIAGNOSIS — R0789 Other chest pain: Secondary | ICD-10-CM

## 2010-11-08 DIAGNOSIS — Z01810 Encounter for preprocedural cardiovascular examination: Secondary | ICD-10-CM

## 2010-11-08 DIAGNOSIS — I2510 Atherosclerotic heart disease of native coronary artery without angina pectoris: Secondary | ICD-10-CM | POA: Insufficient documentation

## 2010-11-08 DIAGNOSIS — R0609 Other forms of dyspnea: Secondary | ICD-10-CM

## 2010-11-08 DIAGNOSIS — K8020 Calculus of gallbladder without cholecystitis without obstruction: Secondary | ICD-10-CM

## 2010-11-08 DIAGNOSIS — R079 Chest pain, unspecified: Secondary | ICD-10-CM

## 2010-11-08 DIAGNOSIS — R0602 Shortness of breath: Secondary | ICD-10-CM

## 2010-11-08 MED ORDER — TECHNETIUM TC 99M TETROFOSMIN IV KIT
11.0000 | PACK | Freq: Once | INTRAVENOUS | Status: AC | PRN
  Administered 2010-11-08: 11 via INTRAVENOUS

## 2010-11-08 MED ORDER — TECHNETIUM TC 99M TETROFOSMIN IV KIT
33.0000 | PACK | Freq: Once | INTRAVENOUS | Status: AC | PRN
  Administered 2010-11-08: 33 via INTRAVENOUS

## 2010-11-08 NOTE — Telephone Encounter (Signed)
Informed patient [place] results and made referrals to GI & Gen Surgery in Epic. Patient states she was referred to Dr Danise Edge last month who has already informed her of her Gallstone and has patient scheduled for Xray on 11/15/10. Will clarify as to if a new referral to GI is necessary.

## 2010-11-08 NOTE — Progress Notes (Signed)
**Note Veronica via Obfuscation** [facility] SITE 3 NUCLEAR MED [address] [place] [address] [phone]  Cardiology Nuclear Med Study  DEEANN [name] is a 60 y.o. female [id number] 1950-06-09   Nuclear Med Background Indication for Stress Test:  Evaluation for Ischemia and 10/10/10 Post Hospital with Chest Pain and negative enzymes History:  Echo and Myocardial Perfusion Study Cardiac Risk Factors: Family History - CAD, Hypertension and Lipids  Symptoms:  Chest Pain, Chest Pressure, Chest Tightness, Diaphoresis, Fatigue, Palpitations and SOB   Nuclear Pre-Procedure Caffeine/Decaff Intake:  None NPO After: 9:00pm   Lungs:  clear IV 0.9% NS with Angio Cath:  20g  IV Site: R Hand  IV Started by:  Cathlyn Parsons, RN  Chest Size (in):  36 Cup Size: C  Height: 5\' 7"  (1.702 m)  Weight:  192 lb (87.091 kg)  BMI:  Body mass index is 30.07 kg/(m^2). Tech Comments:  n/a    Nuclear Med Study 1 or 2 day study: 1 day  Stress Test Type:  Stress  Reading MD: Marca Ancona, MD  Order Authorizing Provider:  J.Paz  Resting Radionuclide: Technetium 70m Tetrofosmin  Resting Radionuclide Dose: 11.0 mCi   Stress Radionuclide:  Technetium 61m Tetrofosmin  Stress Radionuclide Dose: 33.0 mCi           Stress Protocol Rest HR: 65 Stress HR: 151  Rest BP: 125/77 Stress BP: 168/69  Exercise Time (min): 7:00 METS: 8.50   Predicted Max HR: 162 bpm % Max HR: 93.21 bpm Rate Pressure Product: 57846   Dose of Adenosine (mg):  n/a Dose of Lexiscan: n/a mg  Dose of Atropine (mg): n/a Dose of Dobutamine: n/a mcg/kg/min (at max HR)  Stress Test Technologist: Milana Na, EMT-P  Nuclear Technologist:  Domenic Polite, CNMT     Rest Procedure:  Myocardial perfusion imaging was performed at rest 45 minutes following the intravenous administration of Technetium 69m Tetrofosmin. Rest ECG: NSR  Stress Procedure:  The patient exercised for 7:00.  The patient stopped due to fatigue, sob, and denied any  chest pain.  There were no significant ST-T wave changes.  Technetium 63m Tetrofosmin was injected at peak exercise and myocardial perfusion imaging was performed after a brief delay. Stress ECG: No significant change from baseline ECG  QPS Raw Data Images:  Normal; no motion artifact; normal heart/lung ratio. Stress Images:  Normal homogeneous uptake in all areas of the myocardium. Rest Images:  Normal homogeneous uptake in all areas of the myocardium. Subtraction (SDS):  There is no evidence of scar or ischemia. Transient Ischemic Dilatation (Normal <1.22):  0.97 Lung/Heart Ratio (Normal <0.45):  0.29  Quantitative Gated Spect Images QGS EDV:  83 ml QGS ESV:  27 ml QGS cine images:  NL LV Function; NL Wall Motion QGS EF: 67%  Impression Exercise Capacity:  Fair exercise capacity. BP Response:  Normal blood pressure response. Clinical Symptoms:  No chest pain.  Stopped due to dyspnea. ECG Impression:  No significant ST segment change suggestive of ischemia. Comparison with Prior Nuclear Study: No significant change from previous study  Overall Impression:  Normal stress nuclear study. Christino Mcglinchey [employer]

## 2010-11-08 NOTE — Telephone Encounter (Signed)
Message copied by Regis Bill on Wed Nov 08, 2010  6:08 PM ------      Message from: Veronica [name]      Created: Wed Nov 01, 2010  7:56 PM       Advise patient, she does have a gallbladder stone. In addition to a GI referral, she needs to see a general surgeon for possible cholecystectomy. Please arrange the surgical referral

## 2010-11-08 NOTE — Telephone Encounter (Signed)
Patient states she was referred to Dr Danise Edge last month who has already informed her of her Gallstone and has patient scheduled for Xray on 11/15/10. Does patient need a new referral to GI as requested in [place] results?

## 2010-11-09 NOTE — Telephone Encounter (Signed)
Okay, we can go one step at a time, let's finish the surgical evaluation first, hold  GI referral for now.

## 2010-11-13 ENCOUNTER — Telehealth: Payer: Self-pay | Admitting: *Deleted

## 2010-11-13 NOTE — Telephone Encounter (Signed)
Advised patient, stress test negative. Good results 

## 2010-11-13 NOTE — Telephone Encounter (Signed)
Patient requesting results of Stress Test.

## 2010-11-14 NOTE — Telephone Encounter (Signed)
Patient informed. 

## 2010-11-15 ENCOUNTER — Encounter (INDEPENDENT_AMBULATORY_CARE_PROVIDER_SITE_OTHER): Payer: Self-pay | Admitting: Surgery

## 2010-11-15 ENCOUNTER — Ambulatory Visit (INDEPENDENT_AMBULATORY_CARE_PROVIDER_SITE_OTHER): Payer: BC Managed Care – PPO | Admitting: Surgery

## 2010-11-15 ENCOUNTER — Ambulatory Visit
Admission: RE | Admit: 2010-11-15 | Discharge: 2010-11-15 | Disposition: A | Discharge status: Home / Self Care | Payer: BC Managed Care – PPO | Source: Ambulatory Visit | Attending: Gastroenterology | Admitting: Gastroenterology

## 2010-11-15 VITALS — BP 136/72 | HR 72 | Temp 97.1°F | Resp 16 | Ht 66.0 in | Wt 197.4 lb

## 2010-11-15 DIAGNOSIS — R1013 Epigastric pain: Secondary | ICD-10-CM

## 2010-11-15 DIAGNOSIS — K8020 Calculus of gallbladder without cholecystitis without obstruction: Secondary | ICD-10-CM

## 2010-11-15 NOTE — Progress Notes (Signed)
Subjective:     Patient ID: Veronica [name], female   DOB: 12/21/1950, 60 y.o.   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 Most recently it occurred after eating pizza. She had a gallbladder ultrasound today that showed gallstones. This was ordered by Dr. Drue Novel who suspected that she was having gallbladder attacks.    I gave her gallbladder book and explained laparoscopic cholecystectomy and open cholecystectomy to her in some detail. She understands the indications and risks. I told her we would like to make her symptoms better. If she is having some GERD the symptoms might still persisting requiring medication.  She was to proceed with surgery. We'll schedule at her convenience. She is a native of [place], [place].   Patient Active Problem List  Diagnoses  . FIBROIDS, UTERUS  . HYPERLIPIDEMIA  . ANXIETY  . HEMORRHOIDS  . ALLERGIC RHINITIS  . CONSTIPATION, CHRONIC  . LEUKOPLAKIA OF VAGINA  . ACROCHORDON  . HIP PAIN, BILATERAL  . FATIGUE  . CAD (coronary artery disease)  . HTN (hypertension)  . GERD (gastroesophageal reflux disease)    Current Outpatient Prescriptions  Medication Sig Dispense Refill  . ALPRAZolam (XANAX) 0.25 MG tablet Take 0.25 mg by mouth at bedtime as needed.        Marland Kitchen amLODipine (NORVASC) 5 MG tablet Take 1 tablet (5 mg total) by mouth daily.  30 tablet  2  . aspirin 81 MG tablet Take 81 mg by mouth daily.        Marland Kitchen atorvastatin (LIPITOR) 20 MG tablet Take 1 tablet (20 mg total) by mouth at bedtime.  30 tablet  5  . Calcium Carbonate-Vitamin D (CALCIUM 600/VITAMIN D PO) Take by mouth.        . pantoprazole (PROTONIX) 40 MG tablet Take 1 tablet (40 mg total) by mouth daily.  30 tablet  3  . vitamin B-12 (CYANOCOBALAMIN) 1000 MCG tablet Take 1,000 mcg by mouth daily.          Past Medical History  Diagnosis Date  . Allergic rhinitis   . Anxiety and  depression   . Hemorrhoids   . Constipation, chronic   . Uterine fibroid   . Low back pain     w/ left side radiculopathy, s/p injection at L4-L5 forarmen 1/06   Past Surgical History  Procedure Date  . Tubal ligation     bilateral 1989  . Abdominal hysterectomy     total abdominal, November 2002 (at [facility])  . Leep     unknown date    No Known Allergies    Review of Systems  Constitutional: Negative.   HENT: Negative.   Eyes: Negative.   Respiratory: Negative.   Cardiovascular: Negative.   Gastrointestinal: Positive for nausea and abdominal pain.  Genitourinary: Negative.   Musculoskeletal: Negative.   Skin: Negative.   Neurological: Negative.   Hematological: Negative.   Psychiatric/Behavioral: Negative.        Objective:   Physical Exam  Constitutional: She is oriented to person, place, and time. She appears well-developed and well-nourished.  HENT:  Head: Normocephalic and atraumatic.  Eyes: Conjunctivae and EOM are normal. Pupils are equal, round, and reactive to light.  Neck: Normal range of motion. Neck supple.  Cardiovascular: Normal rate and regular rhythm.   Pulmonary/Chest: Effort normal and breath sounds normal.  Abdominal: Soft. Bowel sounds are normal.  Musculoskeletal: Normal range  of motion.  Neurological: She is alert and oriented to person, place, and time.  Skin: Skin is warm and dry.  Psychiatric: She has a normal mood and affect. Her behavior is normal. Judgment and thought content normal.       Assessment:     Symptomatic gallstones     Plan:   Laparoscopic cholecystectomy

## 2010-11-15 NOTE — Telephone Encounter (Signed)
Patient informed. 

## 2010-11-17 ENCOUNTER — Encounter: Payer: Self-pay | Admitting: Internal Medicine

## 2010-11-17 ENCOUNTER — Ambulatory Visit (INDEPENDENT_AMBULATORY_CARE_PROVIDER_SITE_OTHER): Payer: BC Managed Care – PPO | Admitting: Internal Medicine

## 2010-11-17 VITALS — BP 108/70 | HR 61 | Temp 98.2°F | Resp 14 | Wt 196.5 lb

## 2010-11-17 DIAGNOSIS — K8050 Calculus of bile duct without cholangitis or cholecystitis without obstruction: Secondary | ICD-10-CM

## 2010-11-17 DIAGNOSIS — K219 Gastro-esophageal reflux disease without esophagitis: Secondary | ICD-10-CM

## 2010-11-17 DIAGNOSIS — K8020 Calculus of gallbladder without cholecystitis without obstruction: Secondary | ICD-10-CM

## 2010-11-17 DIAGNOSIS — Z0000 Encounter for general adult medical examination without abnormal findings: Secondary | ICD-10-CM | POA: Insufficient documentation

## 2010-11-17 NOTE — Assessment & Plan Note (Addendum)
To have surgery in 2 weeks, in retrospect most of her symptoms are likely due to gallbladder stones although the GERD may be playing a role as well. Will keep the patient on PPIs for 6 weeks and then as needed only

## 2010-11-17 NOTE — Patient Instructions (Signed)
Continue with protonix x 6 weeks, then as needed

## 2010-11-17 NOTE — Assessment & Plan Note (Signed)
See above

## 2010-11-17 NOTE — Assessment & Plan Note (Signed)
Per GI note, next screening colonoscopy 2015. (Dr. Danise Edge)

## 2010-11-17 NOTE — Progress Notes (Signed)
  Subjective:    Patient ID: ROWENE SUTO, female    DOB: 01/14/1951, 60 y.o.   MRN: [id number]  HPI Since his last office visit, ultrasound showed cholelithiasis, she saw surgery and they are planning surgery in 2 weeks.  Also saw GI, note reviewed,they  agreed w/ the surgical referal and ordered a barium swallow Past Medical History  Diagnosis Date  . Allergic rhinitis   . Anxiety and depression   . Hemorrhoids   . Constipation, chronic   . Uterine fibroid   . Low back pain     w/ left side radiculopathy, s/p injection at L4-L5 forarmen 1/06   Past Surgical History  Procedure Date  . Tubal ligation     bilateral 1989  . Abdominal hysterectomy     total abdominal, November 2002 (at [facility])  . Leep     unknown date     Review of Systems Good compliance with PPIs, all GI symptoms has decreased substantially although she recognizes that she gets some upper abdominal discomfort after eating.    Objective:   Physical Exam  Constitutional: She appears well-developed. No distress.  Abdominal:       Soft, nondistended, very mild upper and lower abdominal tenderness without mass or rebound  Skin: She is not diaphoretic.          Assessment & Plan:

## 2010-11-20 ENCOUNTER — Emergency Department (HOSPITAL_COMMUNITY)
Admission: EM | Admit: 2010-11-20 | Discharge: 2010-11-20 | Disposition: A | Discharge status: Home / Self Care | Payer: BC Managed Care – PPO | Attending: Emergency Medicine | Admitting: Emergency Medicine

## 2010-11-20 ENCOUNTER — Emergency Department (HOSPITAL_COMMUNITY): Payer: BC Managed Care – PPO

## 2010-11-20 DIAGNOSIS — R109 Unspecified abdominal pain: Secondary | ICD-10-CM | POA: Insufficient documentation

## 2010-11-20 DIAGNOSIS — K8020 Calculus of gallbladder without cholecystitis without obstruction: Secondary | ICD-10-CM | POA: Insufficient documentation

## 2010-11-20 DIAGNOSIS — I10 Essential (primary) hypertension: Secondary | ICD-10-CM | POA: Insufficient documentation

## 2010-11-20 LAB — COMPREHENSIVE METABOLIC PANEL
BUN: 11 mg/dL (ref 6–23)
CO2: 25 mEq/L (ref 19–32)
Chloride: 103 mEq/L (ref 96–112)
Creatinine, Ser: 0.61 mg/dL (ref 0.50–1.10)
GFR calc non Af Amer: >90 mL/min (ref >90)
Glucose, Bld: 105 mg/dL — ABNORMAL HIGH (ref 70–99)
Total Bilirubin: 0.3 mg/dL (ref 0.3–1.2)

## 2010-11-20 LAB — DIFFERENTIAL
Eosinophils Relative: 0 % (ref 0–5)
Lymphocytes Relative: 10 % — ABNORMAL LOW (ref 12–46)
Lymphs Abs: 0.9 10*3/uL (ref 0.7–4.0)

## 2010-11-20 LAB — LIPASE, BLOOD: Lipase: 35 U/L (ref 11–59)

## 2010-11-20 LAB — CBC
HCT: 35 % — ABNORMAL LOW (ref 36.0–46.0)
Hemoglobin: 12.1 g/dL (ref 12.0–15.0)
MCV: 85.8 fL (ref 78.0–100.0)
RBC: 4.08 MIL/uL (ref 3.87–5.11)
WBC: 9.1 10*3/uL (ref 4.0–10.5)

## 2010-11-30 ENCOUNTER — Encounter (HOSPITAL_COMMUNITY): Payer: BC Managed Care – PPO

## 2010-11-30 ENCOUNTER — Encounter (HOSPITAL_COMMUNITY): Payer: Self-pay

## 2010-11-30 LAB — SURGICAL PCR SCREEN: MRSA, PCR: NEGATIVE

## 2010-12-08 ENCOUNTER — Encounter (HOSPITAL_COMMUNITY)
Admission: RE | Disposition: A | Discharge status: Home / Self Care | Payer: Self-pay | Source: Ambulatory Visit | Attending: Surgery

## 2010-12-08 ENCOUNTER — Encounter (HOSPITAL_COMMUNITY): Payer: Self-pay | Admitting: Anesthesiology

## 2010-12-08 ENCOUNTER — Ambulatory Visit (HOSPITAL_COMMUNITY)
Admission: RE | Admit: 2010-12-08 | Discharge: 2010-12-09 | DRG: 494 | Disposition: A | Discharge status: Home / Self Care | Payer: BC Managed Care – PPO | Source: Ambulatory Visit | Attending: Surgery | Admitting: Surgery

## 2010-12-08 ENCOUNTER — Encounter (HOSPITAL_COMMUNITY): Payer: Self-pay | Admitting: *Deleted

## 2010-12-08 ENCOUNTER — Other Ambulatory Visit (INDEPENDENT_AMBULATORY_CARE_PROVIDER_SITE_OTHER): Payer: Self-pay | Admitting: Surgery

## 2010-12-08 ENCOUNTER — Ambulatory Visit (HOSPITAL_COMMUNITY): Payer: BC Managed Care – PPO | Admitting: Anesthesiology

## 2010-12-08 ENCOUNTER — Ambulatory Visit (HOSPITAL_COMMUNITY): Payer: BC Managed Care – PPO

## 2010-12-08 ENCOUNTER — Encounter (HOSPITAL_COMMUNITY): Payer: Self-pay | Admitting: Surgery

## 2010-12-08 DIAGNOSIS — K8010 Calculus of gallbladder with chronic cholecystitis without obstruction: Secondary | ICD-10-CM | POA: Insufficient documentation

## 2010-12-08 DIAGNOSIS — I10 Essential (primary) hypertension: Secondary | ICD-10-CM | POA: Insufficient documentation

## 2010-12-08 DIAGNOSIS — F411 Generalized anxiety disorder: Secondary | ICD-10-CM | POA: Insufficient documentation

## 2010-12-08 DIAGNOSIS — Z7982 Long term (current) use of aspirin: Secondary | ICD-10-CM | POA: Insufficient documentation

## 2010-12-08 DIAGNOSIS — Z01812 Encounter for preprocedural laboratory examination: Secondary | ICD-10-CM | POA: Insufficient documentation

## 2010-12-08 DIAGNOSIS — I2510 Atherosclerotic heart disease of native coronary artery without angina pectoris: Secondary | ICD-10-CM | POA: Insufficient documentation

## 2010-12-08 DIAGNOSIS — K8020 Calculus of gallbladder without cholecystitis without obstruction: Secondary | ICD-10-CM

## 2010-12-08 DIAGNOSIS — E785 Hyperlipidemia, unspecified: Secondary | ICD-10-CM | POA: Insufficient documentation

## 2010-12-08 DIAGNOSIS — K219 Gastro-esophageal reflux disease without esophagitis: Secondary | ICD-10-CM | POA: Insufficient documentation

## 2010-12-08 DIAGNOSIS — Z79899 Other long term (current) drug therapy: Secondary | ICD-10-CM | POA: Insufficient documentation

## 2010-12-08 HISTORY — DX: Atherosclerotic heart disease of native coronary artery without angina pectoris: I25.10

## 2010-12-08 HISTORY — DX: Leukoplakia of vagina: N89.4

## 2010-12-08 HISTORY — PX: CHOLECYSTECTOMY: SHX55

## 2010-12-08 HISTORY — DX: Anxiety disorder, unspecified: F41.9

## 2010-12-08 HISTORY — DX: Pain in left hip: M25.551

## 2010-12-08 HISTORY — DX: Pain in right hip: M25.552

## 2010-12-08 HISTORY — DX: Essential (primary) hypertension: I10

## 2010-12-08 SURGERY — LAPAROSCOPIC CHOLECYSTECTOMY WITH INTRAOPERATIVE CHOLANGIOGRAM
Anesthesia: General | Site: Abdomen | Wound class: Clean Contaminated

## 2010-12-08 MED ORDER — LACTATED RINGERS IR SOLN
Status: DC | PRN
  Administered 2010-12-08: 1000 mL

## 2010-12-08 MED ORDER — ACETAMINOPHEN 10 MG/ML IV SOLN
INTRAVENOUS | Status: AC
  Filled 2010-12-08: qty 100

## 2010-12-08 MED ORDER — ACETAMINOPHEN 10 MG/ML IV SOLN
INTRAVENOUS | Status: DC | PRN
  Administered 2010-12-08: 1000 mg via INTRAVENOUS

## 2010-12-08 MED ORDER — HEPARIN SODIUM (PORCINE) 5000 UNIT/ML IJ SOLN
5000.0000 [IU] | Freq: Once | INTRAMUSCULAR | Status: AC
  Administered 2010-12-08: 5000 [IU] via SUBCUTANEOUS

## 2010-12-08 MED ORDER — CEFAZOLIN SODIUM 1-5 GM-% IV SOLN
2.0000 g | Freq: Once | INTRAVENOUS | Status: AC
  Administered 2010-12-08: 2 g via INTRAVENOUS

## 2010-12-08 MED ORDER — FENTANYL CITRATE 0.05 MG/ML IJ SOLN
INTRAMUSCULAR | Status: DC | PRN
  Administered 2010-12-08 (×3): 50 ug via INTRAVENOUS
  Administered 2010-12-08: 100 ug via INTRAVENOUS

## 2010-12-08 MED ORDER — DEXAMETHASONE SODIUM PHOSPHATE 10 MG/ML IJ SOLN
INTRAMUSCULAR | Status: DC | PRN
  Administered 2010-12-08: 10 mg via INTRAVENOUS

## 2010-12-08 MED ORDER — MIDAZOLAM HCL 5 MG/5ML IJ SOLN
INTRAMUSCULAR | Status: DC | PRN
  Administered 2010-12-08: 2 mg via INTRAVENOUS

## 2010-12-08 MED ORDER — IOHEXOL 300 MG/ML  SOLN
INTRAMUSCULAR | Status: DC | PRN
  Administered 2010-12-08: 4 mL

## 2010-12-08 MED ORDER — GLYCOPYRROLATE 0.2 MG/ML IJ SOLN
INTRAMUSCULAR | Status: DC | PRN
  Administered 2010-12-08: .6 mg via INTRAVENOUS

## 2010-12-08 MED ORDER — BUPIVACAINE LIPOSOME 1.3 % IJ SUSP
INTRAMUSCULAR | Status: DC | PRN
  Administered 2010-12-08: 20 mL

## 2010-12-08 MED ORDER — IOHEXOL 300 MG/ML  SOLN
INTRAMUSCULAR | Status: AC
  Filled 2010-12-08: qty 1

## 2010-12-08 MED ORDER — ROCURONIUM BROMIDE 100 MG/10ML IV SOLN
INTRAVENOUS | Status: DC | PRN
  Administered 2010-12-08: 40 mg via INTRAVENOUS

## 2010-12-08 MED ORDER — LACTATED RINGERS IV SOLN
INTRAVENOUS | Status: DC
  Administered 2010-12-08: 11:00:00 via INTRAVENOUS
  Administered 2010-12-08: 1000 mL via INTRAVENOUS

## 2010-12-08 MED ORDER — PROPOFOL 10 MG/ML IV EMUL
INTRAVENOUS | Status: DC | PRN
  Administered 2010-12-08: 150 mg via INTRAVENOUS

## 2010-12-08 MED ORDER — FENTANYL CITRATE 0.05 MG/ML IJ SOLN
INTRAMUSCULAR | Status: AC
  Filled 2010-12-08: qty 2

## 2010-12-08 MED ORDER — HEPARIN SODIUM (PORCINE) 5000 UNIT/ML IJ SOLN
5000.0000 [IU] | Freq: Three times a day (TID) | INTRAMUSCULAR | Status: DC
  Administered 2010-12-08 – 2010-12-09 (×2): 5000 [IU] via SUBCUTANEOUS
  Filled 2010-12-08 (×5): qty 1

## 2010-12-08 MED ORDER — OXYCODONE-ACETAMINOPHEN 5-325 MG PO TABS
1.0000 | ORAL_TABLET | ORAL | Status: DC | PRN
  Administered 2010-12-09: 1 via ORAL
  Filled 2010-12-08: qty 1

## 2010-12-08 MED ORDER — LIDOCAINE HCL (CARDIAC) 20 MG/ML IV SOLN
INTRAVENOUS | Status: DC | PRN
  Administered 2010-12-08: 50 mg via INTRAVENOUS

## 2010-12-08 MED ORDER — MORPHINE SULFATE 2 MG/ML IJ SOLN: 1.0000 mg | INTRAMUSCULAR | Status: DC | PRN

## 2010-12-08 MED ORDER — ONDANSETRON HCL 4 MG/2ML IJ SOLN
INTRAMUSCULAR | Status: DC | PRN
  Administered 2010-12-08: 4 mg via INTRAVENOUS

## 2010-12-08 MED ORDER — KCL IN DEXTROSE-NACL 20-5-0.45 MEQ/L-%-% IV SOLN
INTRAVENOUS | Status: DC
  Administered 2010-12-08 – 2010-12-09 (×2): via INTRAVENOUS
  Filled 2010-12-08 (×4): qty 1000

## 2010-12-08 MED ORDER — MEPERIDINE HCL 25 MG/ML IJ SOLN: 6.2500 mg | INTRAMUSCULAR | Status: DC | PRN

## 2010-12-08 MED ORDER — SODIUM CHLORIDE 0.9 % IR SOLN
Status: DC | PRN
  Administered 2010-12-08: 4 mL

## 2010-12-08 MED ORDER — CEFAZOLIN SODIUM 1-5 GM-% IV SOLN
INTRAVENOUS | Status: AC
  Filled 2010-12-08: qty 100

## 2010-12-08 MED ORDER — NEOSTIGMINE METHYLSULFATE 1 MG/ML IJ SOLN
INTRAMUSCULAR | Status: DC | PRN
  Administered 2010-12-08: 4 mg via INTRAVENOUS

## 2010-12-08 MED ORDER — ACETAMINOPHEN 325 MG PO TABS
650.0000 mg | ORAL_TABLET | ORAL | Status: DC | PRN
  Administered 2010-12-08: 650 mg via ORAL
  Filled 2010-12-08: qty 2

## 2010-12-08 MED ORDER — BUPIVACAINE LIPOSOME 1.3 % IJ SUSP
20.0000 mL | Freq: Once | INTRAMUSCULAR | Status: DC
  Filled 2010-12-08: qty 20

## 2010-12-08 MED ORDER — PROMETHAZINE HCL 25 MG/ML IJ SOLN: 6.2500 mg | INTRAMUSCULAR | Status: DC | PRN

## 2010-12-08 MED ORDER — FENTANYL CITRATE 0.05 MG/ML IJ SOLN
25.0000 ug | INTRAMUSCULAR | Status: DC | PRN
  Administered 2010-12-08 (×2): 25 ug via INTRAVENOUS

## 2010-12-08 SURGICAL SUPPLY — 39 items
APPLIER CLIP 5 13 M/L LIGAMAX5 (MISCELLANEOUS)
APPLIER CLIP ROT 10 11.4 M/L (STAPLE) ×2
BENZOIN TINCTURE PRP APPL 2/3 (GAUZE/BANDAGES/DRESSINGS) ×2 IMPLANT
CABLE HIGH FREQUENCY MONO STRZ (ELECTRODE) ×2 IMPLANT
CANISTER SUCTION 2500CC (MISCELLANEOUS) ×2 IMPLANT
CATH REDDICK CHOLANGI 4FR 50CM (CATHETERS) IMPLANT
CLIP APPLIE 5 13 M/L LIGAMAX5 (MISCELLANEOUS) IMPLANT
CLIP APPLIE ROT 10 11.4 M/L (STAPLE) ×1 IMPLANT
CLOTH BEACON ORANGE TIMEOUT ST (SAFETY) ×2 IMPLANT
COVER MAYO STAND STRL (DRAPES) ×2 IMPLANT
COVER SURGICAL LIGHT HANDLE (MISCELLANEOUS) ×2 IMPLANT
DECANTER SPIKE VIAL GLASS SM (MISCELLANEOUS) ×2 IMPLANT
DRAPE C-ARM 42X72 X-RAY (DRAPES) ×2 IMPLANT
DRAPE LAPAROSCOPIC ABDOMINAL (DRAPES) ×2 IMPLANT
ELECT REM PT RETURN 9FT ADLT (ELECTROSURGICAL) ×2
ELECTRODE REM PT RTRN 9FT ADLT (ELECTROSURGICAL) ×1 IMPLANT
GLOVE BIOGEL M 8.0 STRL (GLOVE) ×2 IMPLANT
GOWN STRL NON-REIN LRG LVL3 (GOWN DISPOSABLE) ×2 IMPLANT
GOWN STRL REIN XL XLG (GOWN DISPOSABLE) ×4 IMPLANT
HEMOSTAT SURGICEL 4X8 (HEMOSTASIS) ×2 IMPLANT
IV CATH 14GX2 1/4 (CATHETERS) ×2 IMPLANT
KIT BASIN OR (CUSTOM PROCEDURE TRAY) ×2 IMPLANT
NS IRRIG 1000ML POUR BTL (IV SOLUTION) ×2 IMPLANT
POUCH SPECIMEN RETRIEVAL 10MM (ENDOMECHANICALS) ×2 IMPLANT
SET CHOLANGIOGRAPH MIX (MISCELLANEOUS) ×2 IMPLANT
SET IRRIG TUBING LAPAROSCOPIC (IRRIGATION / IRRIGATOR) ×2 IMPLANT
SLEEVE Z-THREAD 5X100MM (TROCAR) ×2 IMPLANT
SOLUTION ANTI FOG 6CC (MISCELLANEOUS) ×2 IMPLANT
STRIP CLOSURE SKIN 1/2X4 (GAUZE/BANDAGES/DRESSINGS) ×2 IMPLANT
SUT VIC AB 4-0 SH 18 (SUTURE) ×2 IMPLANT
SYR 30ML LL (SYRINGE) ×2 IMPLANT
TOWEL OR 17X26 10 PK STRL BLUE (TOWEL DISPOSABLE) ×4 IMPLANT
TRAY LAP CHOLE (CUSTOM PROCEDURE TRAY) ×2 IMPLANT
TROCAR BLADELESS OPT 5 75 (ENDOMECHANICALS) IMPLANT
TROCAR XCEL BLUNT TIP 100MML (ENDOMECHANICALS) ×2 IMPLANT
TROCAR XCEL NON-BLD 11X100MML (ENDOMECHANICALS) IMPLANT
TROCAR Z-THREAD FIOS 11X100 BL (TROCAR) ×2 IMPLANT
TROCAR Z-THREAD FIOS 5X100MM (TROCAR) ×2 IMPLANT
TUBING INSUFFLATION 10FT LAP (TUBING) ×2 IMPLANT

## 2010-12-08 NOTE — Op Note (Signed)
Surgeon Luretha Murphy Asst. none Anesthesia Gen. endotracheal Drains none Procedure: Laparoscopic cholecystectomy with intraoperative cholangiogram Findings: Marked chronic cholecystitis with 3 cm gallstone, normal intraoperative cholangiogram with suspected small branch coming off cystic duct    Description of procedure: The patient was taken to room 1 on Friday, 12/08/2010 and given general anesthesia. The abdomen was prepped and PCMX draped sterilely. Timeout was performed. Abdomen was entered through the umbilicus using Hassan technique cutting down and placing this under direct vision without difficulty. Following insufflation 11 was placed in the upper midline and 2000 placed laterally. The gallbladder was grasped and elevated. Numerous omental adhesions were stuck up to the fundus infundibulum and neck and these were stripped away with gentle blunt dissection and with the hook electrocautery. I dissected the area of K. Lowes triangle and achieved a critical view. Clip was applied to the gallbladder an incision was made in the cystic duct. A Reddick catheter was inserted and a dynamic cholangiogram performed. This showed a long cystic duct with retrograde filling up into the intrahepatic radicals on arrival at branch and free flow into the duodenum. No stones or foreign bodies were seen. I then initially clipped the cystic duct and not thought what initially was a artery might be a small tiny ancillary bile duct. I spared that and clipped toward the gallbladder with 3 clips and then divided the cystic duct. I had the cystic artery triple clipped and then removed the gallbladder from the gallbladder bed with hook electrocautery. I did not enter it. He was then placed into a bag and brought through the umbilicus. I did have to enlarge the umbilical port incision because the stone was so large that I could not physically crushed it with a hemostat.  I then went back and looked at the gallbladder bed. I  placed a piece of Surgicel there although there was no bleeding noted. No bile leaks were noted. Everything looked good. The port sites were injected with Expariel.  Our repair the umbilical defect with 3 sutures of 0 Vicryl using the laparoscope on the inside to observe to make sure it did not get anything other than the fascia. I then deflated the abdomen and closed the skin incisions with 4-0 Vicryl benzoin Steri-Strips.  Patient tolerated procedure well and was taken recovery room in satisfactory condition. She'll be kept overnight for observation.

## 2010-12-08 NOTE — H&P (Signed)
[name], MD 11/15/2010 2:13 PM Signed  Subjective:   Patient ID: Veronica [name], female DOB: Jan 14, 1951, 60 y.o. 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 Most recently it occurred after eating pizza. She had a gallbladder ultrasound today that showed gallstones. This was ordered by Dr. Drue Novel who suspected that she was having gallbladder attacks.  I gave her gallbladder book and explained laparoscopic cholecystectomy and open cholecystectomy to her in some detail. She understands the indications and risks. I told her we would like to make her symptoms better. If she is having some GERD the symptoms might still persisting requiring medication.  She was to proceed with surgery. We'll schedule at her convenience. She is a native of [place], [place].  Patient Active Problem List   Diagnoses   .  FIBROIDS, UTERUS   .  HYPERLIPIDEMIA   .  ANXIETY   .  HEMORRHOIDS   .  ALLERGIC RHINITIS   .  CONSTIPATION, CHRONIC   .  LEUKOPLAKIA OF VAGINA   .  ACROCHORDON   .  HIP PAIN, BILATERAL   .  FATIGUE   .  CAD (coronary artery disease)   .  HTN (hypertension)   .  GERD (gastroesophageal reflux disease)    Current Outpatient Prescriptions   Medication  Sig  Dispense  Refill   .  ALPRAZolam (XANAX) 0.25 MG tablet  Take 0.25 mg by mouth at bedtime as needed.     Marland Kitchen  amLODipine (NORVASC) 5 MG tablet  Take 1 tablet (5 mg total) by mouth daily.  30 tablet  2   .  aspirin 81 MG tablet  Take 81 mg by mouth daily.     Marland Kitchen  atorvastatin (LIPITOR) 20 MG tablet  Take 1 tablet (20 mg total) by mouth at bedtime.  30 tablet  5   .  Calcium Carbonate-Vitamin D (CALCIUM 600/VITAMIN D PO)  Take by mouth.     .  pantoprazole (PROTONIX) 40 MG tablet  Take 1 tablet (40 mg total) by mouth daily.  30 tablet  3   .  vitamin B-12 (CYANOCOBALAMIN) 1000 MCG tablet  Take 1,000 mcg by mouth daily.      Past Medical  History   Diagnosis  Date   .  Allergic rhinitis    .  Anxiety and depression    .  Hemorrhoids    .  Constipation, chronic    .  Uterine fibroid    .  Low back pain      w/ left side radiculopathy, s/p injection at L4-L5 forarmen 1/06    Past Surgical History   Procedure  Date   .  Tubal ligation      bilateral 1989   .  Abdominal hysterectomy      total abdominal, November 2002 (at [facility])   .  Leep      unknown date    No Known Allergies  Review of Systems  Constitutional: Negative.  HENT: Negative.  Eyes: Negative.  Respiratory: Negative.  Cardiovascular: Negative.  Gastrointestinal: Positive for nausea and abdominal pain.  Genitourinary: Negative.  Musculoskeletal: Negative.  Skin: Negative.  Neurological: Negative.  Hematological: Negative.  Psychiatric/Behavioral: Negative.    Objective:   Physical Exam  Constitutional: She is oriented to person, place, and time. She appears well-developed and well-nourished.  HENT:  Head: Normocephalic and atraumatic.  Eyes: Conjunctivae and  EOM are normal. Pupils are equal, round, and reactive to light.  Neck: Normal range of motion. Neck supple.  Cardiovascular: Normal rate and regular rhythm.  Pulmonary/Chest: Effort normal and breath sounds normal.  Abdominal: Soft. Bowel sounds are normal.  Musculoskeletal: Normal range of motion.  Neurological: She is alert and oriented to person, place, and time.  Skin: Skin is warm and dry.  Psychiatric: She has a normal mood and affect. Her behavior is normal. Judgment and thought content normal.    Assessment:    Symptomatic gallstones   Plan:   Laparoscopic cholecystectomy.  I have reviewed the patient's record and there has been no change. Matt B. Daphine Deutscher, MD, [facility] [phone] beeper [phone]  12/08/2010 7:49 AM

## 2010-12-08 NOTE — Anesthesia Procedure Notes (Signed)
Procedures

## 2010-12-08 NOTE — Anesthesia Postprocedure Evaluation (Signed)
  Anesthesia Post-op Note  Patient: Veronica [name]  Procedure(s) Performed:  LAPAROSCOPIC CHOLECYSTECTOMY WITH INTRAOPERATIVE CHOLANGIOGRAM - c-arm   Patient Location: PACU  Anesthesia Type: General  Level of Consciousness: awake and alert   Airway and Oxygen Therapy: Patient Spontanous Breathing  Post-op Pain: mild  Post-op Assessment: Post-op Vital signs reviewed, Patient's Cardiovascular Status Stable, Respiratory Function Stable, Patent Airway and No signs of Nausea or vomiting  Post-op Vital Signs: stable  Complications: No apparent anesthesia complications

## 2010-12-08 NOTE — Anesthesia Preprocedure Evaluation (Signed)
Anesthesia Evaluation  Patient identified by MRN, date of birth, ID band Patient awake    Reviewed: Allergy & Precautions, H&P , NPO status , Patient's Chart, lab work & pertinent test results  History of Anesthesia Complications Negative for: history of anesthetic complications  Airway Mallampati: II TM Distance: >3 FB Neck ROM: Full    Dental No notable dental hx.    Pulmonary neg pulmonary ROS,  clear to auscultation  Pulmonary exam normal       Cardiovascular hypertension, Pt. on medications neg cardio ROS - CAD Regular Normal    Neuro/Psych Negative Neurological ROS  Negative Psych ROS   GI/Hepatic negative GI ROS, Neg liver ROS, GERD-  ,  Endo/Other  Negative Endocrine ROS  Renal/GU negative Renal ROS  Genitourinary negative   Musculoskeletal negative musculoskeletal ROS (+)   Abdominal   Peds negative pediatric ROS (+)  Hematology negative hematology ROS (+)   Anesthesia Other Findings   Reproductive/Obstetrics negative OB ROS                           Anesthesia Physical Anesthesia Plan  ASA: II  Anesthesia Plan: General   Post-op Pain Management:    Induction: Intravenous  Airway Management Planned: Oral ETT  Additional Equipment:   Intra-op Plan:   Post-operative Plan: Extubation in OR  Informed Consent: I have reviewed the patients History and Physical, chart, labs and discussed the procedure including the risks, benefits and alternatives for the proposed anesthesia with the patient or authorized representative who has indicated his/her understanding and acceptance.   Dental advisory given  Plan Discussed with: CRNA  Anesthesia Plan Comments:         Anesthesia Quick Evaluation

## 2010-12-08 NOTE — Preoperative (Signed)
Beta Blockers   Reason not to administer Beta Blockers:Not Applicable 

## 2010-12-08 NOTE — Transfer of Care (Signed)
Immediate Anesthesia Transfer of Care Note  Patient: Veronica [name]  Procedure(s) Performed:  LAPAROSCOPIC CHOLECYSTECTOMY WITH INTRAOPERATIVE CHOLANGIOGRAM - c-arm   Patient Location: PACU  Anesthesia Type: General  Level of Consciousness: awake and alert   Airway & Oxygen Therapy: Patient Spontanous Breathing and Patient connected to face mask oxygen  Post-op Assessment: Report given to PACU RN and Patient moving all extremities  Post vital signs: Reviewed and stable  Complications: No apparent anesthesia complications

## 2010-12-09 LAB — COMPREHENSIVE METABOLIC PANEL
Albumin: 3.9 g/dL (ref 3.5–5.2)
Alkaline Phosphatase: 69 U/L (ref 39–117)
BUN: 7 mg/dL (ref 6–23)
CO2: 25 mEq/L (ref 19–32)
Chloride: 103 mEq/L (ref 96–112)
Creatinine, Ser: 0.55 mg/dL (ref 0.50–1.10)
GFR calc Af Amer: >90 mL/min (ref >90)
GFR calc non Af Amer: >90 mL/min (ref >90)
Glucose, Bld: 134 mg/dL — ABNORMAL HIGH (ref 70–99)
Potassium: 4 mEq/L (ref 3.5–5.1)
Total Bilirubin: 0.4 mg/dL (ref 0.3–1.2)

## 2010-12-09 LAB — CBC
MCV: 85.7 fL (ref 78.0–100.0)
Platelets: 206 10*3/uL (ref 150–400)
RBC: 4.19 MIL/uL (ref 3.87–5.11)
RDW: 13.3 % (ref 11.5–15.5)
WBC: 10.3 10*3/uL (ref 4.0–10.5)

## 2010-12-09 MED ORDER — HYDROCODONE-ACETAMINOPHEN 5-500 MG PO TABS: 1.0000 | ORAL_TABLET | Freq: Four times a day (QID) | ORAL | Status: DC | PRN

## 2010-12-09 NOTE — Progress Notes (Signed)
[facility] [address] [place] [address] Phone: [phone]  December 09, 2010  Patient: Veronica [name]  Date of Birth: 09-25-1950  Date of Visit: 11/15/2010    To Whom It May Concern:  Veronica [name] was seen and treated in facility on 11/09-11/10/12. Veronica [name]  may return to work on 12/17/10.  Sincerely,

## 2010-12-09 NOTE — Progress Notes (Signed)
Assessment unchanged. Pt verbalized understanding of dc instructions. Script x1 given to pt as provided by md. Discharged via wc to front entrance to meet awaiting vehicle to carry home. Accompanied by nurse tech and family member.

## 2010-12-09 NOTE — Progress Notes (Signed)
  1 Day Post-Op  Subjective: No issues, pain controlled, tolerating diet  Objective: Vital signs in last 24 hours: Temp:  [97.2 F (36.2 C)-98.8 F (37.1 C)] 97.6 F (36.4 C) (11/10 0532) Pulse Rate:  [56-81] 81  (11/10 0532) Resp:  [16-18] 18  (11/10 0532) BP: (113-130)/(68-80) 119/69 mmHg (11/10 0532) SpO2:  [96 %-100 %] 99 % (11/10 0532) Weight:  [190 lb (86.183 kg)] 190 lb (86.183 kg) (11/09 1335) Last BM Date: 12/07/10  Intake/Output from previous day: 11/09 0701 - 11/10 0700 In: 2423 [P.O.:240; I.V.:2183] Out: 875 [Urine:850; Blood:25] Intake/Output this shift:    General appearance: alert, cooperative and no distress GI: soft, minimal incisional tenderness, ND, no peritoneal signs Incision/Wound:dressings clean, no sign of infection   Lab Results:  @LABLAST2 (wbc:2,hgb:2,hct:2,plt:2) BMET  Basename 12/09/10 0547  NA 135  K 4.0  CL 103  CO2 25  GLUCOSE 134*  BUN 7  CREATININE 0.55  CALCIUM 9.4   PT/INR No results found for this basename: LABPROT:2,INR:2 in the last 72 hours ABG No results found for this basename: PHART:2,PCO2:2,PO2:2,HCO3:2 in the last 72 hours  Studies/Results: Dg Cholangiogram Operative  12/08/2010  *RADIOLOGY REPORT*  Clinical data:  Cholelithiasis.  Laparoscopic cholecystectomy.  INTRAOPERATIVE CHOLANGIOGRAM 12/08/2010:  Comparison: Abdominal ultrasound 11/20/2010.  Findings:  2 series of cholangiographic images from the C-arm fluoroscopic device were submitted for interpretation post- operatively.  The cannula is present in the cystic duct remnant with excellent opacification of the common bile duct, common hepatic duct, and proximal intrahepatic ducts.  No filling defects to suggest retained bile duct stones.  Excellent antegrade flow into the duodenum.  No extravasation.  Note is made of low insertion of the cystic duct onto the common duct.  The radiologic technologist documented 13 seconds of fluoroscopy time.  Please correlate with  findings at real time fluoroscopy.  IMPRESSION: Normal intraoperative cholangiogram.  These images were submitted for radiologic interpretation only. Please see the procedural report for the amount of contrast and the fluoroscopy time utilized.  Original Report Authenticated By: Arnell Sieving, M.D.    Anti-infectives: Anti-infectives     Start     Dose/Rate Route Frequency Ordered Stop   12/08/10 0845   ceFAZolin (ANCEF) IVPB 1 g/50 mL premix        2 g 200 mL/hr over 30 Minutes Intravenous  Once 12/08/10 0844 12/08/10 1023          Assessment/Plan: s/p Procedure(s): LAPAROSCOPIC CHOLECYSTECTOMY WITH INTRAOPERATIVE CHOLANGIOGRAM She is doing well, will plan for discharge today  LOS: 1 day    Lodema Pilot DAVID 12/09/2010

## 2010-12-12 ENCOUNTER — Encounter (HOSPITAL_COMMUNITY): Payer: Self-pay | Admitting: Surgery

## 2010-12-29 ENCOUNTER — Encounter (INDEPENDENT_AMBULATORY_CARE_PROVIDER_SITE_OTHER): Payer: BC Managed Care – PPO | Admitting: Surgery

## 2011-01-03 ENCOUNTER — Encounter (INDEPENDENT_AMBULATORY_CARE_PROVIDER_SITE_OTHER): Payer: BC Managed Care – PPO | Admitting: Surgery

## 2011-02-06 ENCOUNTER — Ambulatory Visit (INDEPENDENT_AMBULATORY_CARE_PROVIDER_SITE_OTHER): Payer: BC Managed Care – PPO | Admitting: Internal Medicine

## 2011-02-06 VITALS — BP 122/78 | HR 69 | Temp 98.1°F | Ht 64.5 in | Wt 191.0 lb

## 2011-02-06 DIAGNOSIS — M25559 Pain in unspecified hip: Secondary | ICD-10-CM

## 2011-02-06 DIAGNOSIS — E785 Hyperlipidemia, unspecified: Secondary | ICD-10-CM

## 2011-02-06 DIAGNOSIS — F411 Generalized anxiety disorder: Secondary | ICD-10-CM

## 2011-02-06 DIAGNOSIS — I10 Essential (primary) hypertension: Secondary | ICD-10-CM

## 2011-02-06 LAB — LIPID PANEL
Cholesterol: 131 mg/dL (ref 0–200)
LDL Cholesterol: 67 mg/dL (ref 0–99)
Triglycerides: 68 mg/dL (ref 0.0–149.0)

## 2011-02-06 LAB — ALT: ALT: 15 U/L (ref 0–35)

## 2011-02-06 MED ORDER — ALPRAZOLAM 0.25 MG PO TABS: 0.2500 mg | ORAL_TABLET | Freq: Every evening | ORAL | Status: DC | PRN

## 2011-02-06 MED ORDER — CYCLOBENZAPRINE HCL 10 MG PO TABS: 10.0000 mg | ORAL_TABLET | Freq: Two times a day (BID) | ORAL | Status: AC | PRN

## 2011-02-06 NOTE — Assessment & Plan Note (Signed)
Start Lipitor [id number]. Good compliance and tolerance. Diet and exercise have improved. Plan: Labs Patient wonders if she could stop Lipitor, we'll wait for the results.

## 2011-02-06 NOTE — Assessment & Plan Note (Signed)
On Xanax when necessary. No change

## 2011-02-06 NOTE — Progress Notes (Signed)
  Subjective:    Patient ID: Veronica [name], female    DOB: 1950-08-10, 61 y.o.   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 Good medication compliance with BP and cholesterol medicine. Denies any chest pain or shortness of breath. No lower extremity edema.     Objective:   Physical Exam  Constitutional: She is oriented to person, place, and time. She appears well-developed and well-nourished.  Cardiovascular: Normal rate, regular rhythm and normal heart sounds.   No murmur heard. Pulmonary/Chest: Effort normal and breath sounds normal. No respiratory distress. She has no wheezes. She has no rales.  Neurological: She is alert and oriented to person, place, and time.  Psychiatric: She has a normal mood and affect. Her behavior is normal. Judgment and thought content normal.       Assessment & Plan:

## 2011-02-06 NOTE — Assessment & Plan Note (Signed)
Needs a Rx for Flexeril when necessary.Done

## 2011-02-06 NOTE — Assessment & Plan Note (Signed)
Well-controlled, no change 

## 2011-02-07 ENCOUNTER — Encounter: Payer: Self-pay | Admitting: Internal Medicine

## 2011-02-10 ENCOUNTER — Encounter: Payer: Self-pay | Admitting: Internal Medicine

## 2011-02-12 ENCOUNTER — Encounter: Payer: Self-pay | Admitting: *Deleted

## 2011-02-12 MED ORDER — ATORVASTATIN CALCIUM 20 MG PO TABS: 10.0000 mg | ORAL_TABLET | Freq: Every day | ORAL | Status: DC

## 2011-02-16 ENCOUNTER — Encounter (INDEPENDENT_AMBULATORY_CARE_PROVIDER_SITE_OTHER): Payer: BC Managed Care – PPO | Admitting: Surgery

## 2011-03-23 ENCOUNTER — Other Ambulatory Visit: Payer: Self-pay | Admitting: Internal Medicine

## 2011-03-26 NOTE — Telephone Encounter (Signed)
Refill done.  

## 2011-05-02 NOTE — Telephone Encounter (Signed)
Close  

## 2011-06-27 ENCOUNTER — Other Ambulatory Visit: Payer: Self-pay | Admitting: Obstetrics and Gynecology

## 2011-07-11 ENCOUNTER — Ambulatory Visit: Payer: BC Managed Care – PPO | Admitting: Internal Medicine

## 2011-07-11 ENCOUNTER — Other Ambulatory Visit: Payer: Self-pay | Admitting: Internal Medicine

## 2011-07-11 DIAGNOSIS — M549 Dorsalgia, unspecified: Secondary | ICD-10-CM

## 2011-07-11 DIAGNOSIS — Z0279 Encounter for issue of other medical certificate: Secondary | ICD-10-CM

## 2011-07-11 DIAGNOSIS — M542 Cervicalgia: Secondary | ICD-10-CM

## 2011-07-11 NOTE — Telephone Encounter (Signed)
Refill done.  

## 2011-08-22 ENCOUNTER — Ambulatory Visit: Payer: BC Managed Care – PPO | Admitting: Internal Medicine

## 2011-08-22 ENCOUNTER — Other Ambulatory Visit (INDEPENDENT_AMBULATORY_CARE_PROVIDER_SITE_OTHER): Payer: BC Managed Care – PPO

## 2011-08-22 DIAGNOSIS — Z0000 Encounter for general adult medical examination without abnormal findings: Secondary | ICD-10-CM

## 2011-08-22 LAB — LIPID PANEL
Total CHOL/HDL Ratio: 3
Triglycerides: 68 mg/dL (ref 0.0–149.0)

## 2011-08-22 LAB — CBC WITH DIFFERENTIAL/PLATELET
Eosinophils Relative: 2.1 % (ref 0.0–5.0)
HCT: 38.9 % (ref 36.0–46.0)
Hemoglobin: 13 g/dL (ref 12.0–15.0)
Lymphs Abs: 1.9 10*3/uL (ref 0.7–4.0)
MCV: 90.3 fl (ref 78.0–100.0)
Monocytes Absolute: 0.3 10*3/uL (ref 0.1–1.0)
Monocytes Relative: 4.5 % (ref 3.0–12.0)
Neutro Abs: 3.7 10*3/uL (ref 1.4–7.7)
WBC: 6.1 10*3/uL (ref 4.5–10.5)

## 2011-08-22 LAB — COMPREHENSIVE METABOLIC PANEL
ALT: 17 U/L (ref 0–35)
AST: 18 U/L (ref 0–37)
Albumin: 4.4 g/dL (ref 3.5–5.2)
BUN: 18 mg/dL (ref 6–23)
CO2: 25 mEq/L (ref 19–32)
Calcium: 9.2 mg/dL (ref 8.4–10.5)
Chloride: 105 mEq/L (ref 96–112)
Creatinine, Ser: 0.6 mg/dL (ref 0.4–1.2)
GFR: 102.63 mL/min (ref >60.00)
Potassium: 3.9 mEq/L (ref 3.5–5.1)

## 2011-08-22 LAB — TSH: TSH: 0.38 u[IU]/mL (ref 0.35–5.50)

## 2011-08-22 NOTE — Progress Notes (Signed)
Lab only 

## 2011-08-28 ENCOUNTER — Encounter: Payer: Self-pay | Admitting: Internal Medicine

## 2011-08-28 ENCOUNTER — Ambulatory Visit (INDEPENDENT_AMBULATORY_CARE_PROVIDER_SITE_OTHER): Payer: BC Managed Care – PPO | Admitting: Internal Medicine

## 2011-08-28 VITALS — BP 124/76 | HR 73 | Temp 98.5°F | Ht 64.25 in | Wt 187.0 lb

## 2011-08-28 DIAGNOSIS — I10 Essential (primary) hypertension: Secondary | ICD-10-CM

## 2011-08-28 DIAGNOSIS — M549 Dorsalgia, unspecified: Secondary | ICD-10-CM

## 2011-08-28 DIAGNOSIS — E785 Hyperlipidemia, unspecified: Secondary | ICD-10-CM

## 2011-08-28 DIAGNOSIS — Z0000 Encounter for general adult medical examination without abnormal findings: Secondary | ICD-10-CM

## 2011-08-28 DIAGNOSIS — M25559 Pain in unspecified hip: Secondary | ICD-10-CM

## 2011-08-28 NOTE — Assessment & Plan Note (Addendum)
Td-- declined to have a Tdap today Colonoscopy ,  Dr. Sherin Quarry, October 26, 2003---> Left-sided diverticulosis Per GI note, next screening colonoscopy 2015. (Dr. Danise Edge)  7-12 mammogram (-)  hysterectomy 2002 for bleeding  Plans to see gynecology soon Discussed diet and exercise DEXA to be ordered All labs discussed

## 2011-08-28 NOTE — Patient Instructions (Addendum)
Please get your x-ray at the other [place]  office located at: [address], across from [facility].  Please go to the basement, this is a walk-in facility, they are open from 8:30 to 5:30 PM. Phone number [phone].  

## 2011-08-28 NOTE — Assessment & Plan Note (Signed)
Well-controlled, no change 
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
## 2011-08-28 NOTE — Progress Notes (Signed)
  Subjective:    Patient ID: Veronica [name], female    DOB: 01/22/1951, 61 y.o.   MRN: [id number]  HPI CPX In addition to her physical, we managed her chronic medical problems. 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 Good compliance with medication, for few days she held  her BP meds to see how she was doing but BP went up so she is now back on her meds. No chest pain or shortness of breath No nausea, vomiting, diarrhea or blood in the stools. No anxiety-depression Alprazolam helps well with insomnia. Self discontinued aspirin d/t ecchymosis. On and off low back pain. See assessment and plan      Objective:   Physical Exam General -- alert, well-developed, and well-nourished.   Neck --no thyromegaly  Lungs -- normal respiratory effort, no intercostal retractions, no accessory muscle use, and normal breath sounds.   Heart-- normal rate, regular rhythm, no murmur, and no gallop.   Abdomen--soft, non-tender, no distention, no masses, no HSM, no guarding, and no rigidity.   Extremities-- no pretibial edema bilaterally Neurologic-- alert & oriented X3 , lower  extremity DTRs and strength normal, gait normal. Psych-- Cognition and judgment appear intact. Alert and cooperative with normal attention span and concentration.  not anxious appearing and not depressed appearing.      Assessment & Plan:

## 2011-08-28 NOTE — Assessment & Plan Note (Signed)
Well-controlled per last FLP, patient is quite reluctant to take medications "all my life" reports that she has improved her diet and exercise. Plan: We agreed to decrease Lipitor from one tablet to half tablet daily. Reassess in one year.

## 2011-09-13 ENCOUNTER — Other Ambulatory Visit: Payer: Self-pay | Admitting: Internal Medicine

## 2011-09-13 DIAGNOSIS — Z1231 Encounter for screening mammogram for malignant neoplasm of breast: Secondary | ICD-10-CM

## 2011-09-14 ENCOUNTER — Ambulatory Visit (HOSPITAL_COMMUNITY): Payer: BC Managed Care – PPO

## 2011-09-14 ENCOUNTER — Ambulatory Visit (HOSPITAL_COMMUNITY)
Admission: RE | Admit: 2011-09-14 | Discharge: 2011-09-14 | Disposition: A | Discharge status: Home / Self Care | Payer: BC Managed Care – PPO | Source: Ambulatory Visit | Attending: Internal Medicine | Admitting: Internal Medicine

## 2011-09-14 DIAGNOSIS — Z1231 Encounter for screening mammogram for malignant neoplasm of breast: Secondary | ICD-10-CM | POA: Insufficient documentation

## 2011-09-14 DIAGNOSIS — Z0000 Encounter for general adult medical examination without abnormal findings: Secondary | ICD-10-CM

## 2011-09-14 DIAGNOSIS — Z13820 Encounter for screening for osteoporosis: Secondary | ICD-10-CM | POA: Insufficient documentation

## 2011-09-25 ENCOUNTER — Encounter: Payer: Self-pay | Admitting: *Deleted

## 2011-10-27 ENCOUNTER — Other Ambulatory Visit: Payer: Self-pay | Admitting: Internal Medicine

## 2011-10-29 NOTE — Telephone Encounter (Signed)
Ok to refill 

## 2011-10-29 NOTE — Telephone Encounter (Signed)
done

## 2011-12-07 ENCOUNTER — Other Ambulatory Visit: Payer: Self-pay | Admitting: Internal Medicine

## 2011-12-07 NOTE — Telephone Encounter (Signed)
Rx sent.    MW 

## 2011-12-12 ENCOUNTER — Encounter (HOSPITAL_COMMUNITY): Payer: Self-pay

## 2011-12-12 ENCOUNTER — Telehealth: Payer: Self-pay | Admitting: Internal Medicine

## 2011-12-12 ENCOUNTER — Emergency Department (HOSPITAL_COMMUNITY): Payer: BC Managed Care – PPO

## 2011-12-12 ENCOUNTER — Emergency Department (HOSPITAL_COMMUNITY)
Admission: EM | Admit: 2011-12-12 | Discharge: 2011-12-12 | Disposition: A | Discharge status: Home / Self Care | Payer: BC Managed Care – PPO | Attending: Emergency Medicine | Admitting: Emergency Medicine

## 2011-12-12 DIAGNOSIS — Z8719 Personal history of other diseases of the digestive system: Secondary | ICD-10-CM | POA: Insufficient documentation

## 2011-12-12 DIAGNOSIS — I10 Essential (primary) hypertension: Secondary | ICD-10-CM | POA: Insufficient documentation

## 2011-12-12 DIAGNOSIS — Z9851 Tubal ligation status: Secondary | ICD-10-CM | POA: Insufficient documentation

## 2011-12-12 DIAGNOSIS — F341 Dysthymic disorder: Secondary | ICD-10-CM | POA: Insufficient documentation

## 2011-12-12 DIAGNOSIS — I2510 Atherosclerotic heart disease of native coronary artery without angina pectoris: Secondary | ICD-10-CM | POA: Insufficient documentation

## 2011-12-12 DIAGNOSIS — J309 Allergic rhinitis, unspecified: Secondary | ICD-10-CM | POA: Insufficient documentation

## 2011-12-12 DIAGNOSIS — Z7982 Long term (current) use of aspirin: Secondary | ICD-10-CM | POA: Insufficient documentation

## 2011-12-12 DIAGNOSIS — R079 Chest pain, unspecified: Secondary | ICD-10-CM

## 2011-12-12 DIAGNOSIS — K5900 Constipation, unspecified: Secondary | ICD-10-CM | POA: Insufficient documentation

## 2011-12-12 DIAGNOSIS — Z8742 Personal history of other diseases of the female genital tract: Secondary | ICD-10-CM | POA: Insufficient documentation

## 2011-12-12 DIAGNOSIS — Z8739 Personal history of other diseases of the musculoskeletal system and connective tissue: Secondary | ICD-10-CM | POA: Insufficient documentation

## 2011-12-12 LAB — COMPREHENSIVE METABOLIC PANEL
ALT: 18 U/L (ref 0–35)
AST: 25 U/L (ref 0–37)
CO2: 26 mEq/L (ref 19–32)
Chloride: 102 mEq/L (ref 96–112)
GFR calc non Af Amer: >90 mL/min (ref >90)
Sodium: 137 mEq/L (ref 135–145)
Total Bilirubin: 0.5 mg/dL (ref 0.3–1.2)

## 2011-12-12 LAB — CBC
MCV: 85.2 fL (ref 78.0–100.0)
Platelets: 230 10*3/uL (ref 150–400)
RBC: 4.59 MIL/uL (ref 3.87–5.11)
WBC: 5.7 10*3/uL (ref 4.0–10.5)

## 2011-12-12 LAB — URINALYSIS, ROUTINE W REFLEX MICROSCOPIC
Bilirubin Urine: NEGATIVE
Hgb urine dipstick: NEGATIVE
Protein, ur: NEGATIVE mg/dL
Urobilinogen, UA: 0.2 mg/dL (ref 0.0–1.0)

## 2011-12-12 MED ORDER — ASPIRIN 81 MG PO CHEW
324.0000 mg | CHEWABLE_TABLET | Freq: Once | ORAL | Status: DC
Start: 2011-12-12 — End: 2011-12-12

## 2011-12-12 MED ORDER — IBUPROFEN 800 MG PO TABS
800.0000 mg | ORAL_TABLET | Freq: Once | ORAL | Status: AC
  Administered 2011-12-12: 800 mg via ORAL
  Filled 2011-12-12: qty 1

## 2011-12-12 NOTE — ED Notes (Signed)
Pt presents with chest pain left side radiates to back- states it started "night before last" awoke a 5am with increased pain worse with taking a breath.

## 2011-12-12 NOTE — ED Notes (Signed)
Pt alert and oriented x4. Respirations even and unlabored, bilateral symmetrical rise and fall of chest. Skin warm and dry. In no acute distress. Denies needs.   

## 2011-12-12 NOTE — ED Provider Notes (Signed)
History     CSN: [id number]  Arrival date & time 12/12/11  7829   First MD Initiated Contact with Patient 12/12/11 1008      Chief Complaint  Patient presents with  . Chest Pain    (Consider location/radiation/quality/duration/timing/severity/associated sxs/prior treatment) HPI 61 year old, female, with a history of hypertension, presents emergency department complaining of left-sided chest pain.  Since probably 5:30 this morning.  It woke her from sleep.  She denies cough, shortness of breath, nausea, vomiting, sweating.  She denies leg pain or swelling.  She does not smoke.  She denies history of coronary artery disease, or myocardial infarction.  She states that she is under a lot of stress because her son is presently very ill.  She denies change in the pain with food.  She denies a history of peptic ulcer disease.  Past Medical History  Diagnosis Date  . Allergic rhinitis   . Anxiety and depression   . Hemorrhoids   . Constipation, chronic   . Uterine fibroid   . Low back pain     w/ left side radiculopathy, s/p injection at L4-L5 forarmen 1/06  . Complication of anesthesia     WITH ONE SURGERY - WOKE UP SHAKING  . Coronary artery disease   . Hypertension   . GERD (gastroesophageal reflux disease)   . Anxiety   . Leukoplakia of vagina   . Acrochordon   . Hip pain, bilateral     Past Surgical History  Procedure Date  . Tubal ligation     bilateral 1989  . Abdominal hysterectomy     total abdominal, November 2002 (at [facility])  . Leep     unknown date   . Cholecystectomy 12/08/2010    Procedure: LAPAROSCOPIC CHOLECYSTECTOMY WITH INTRAOPERATIVE CHOLANGIOGRAM;  Surgeon: [name], MD;  Location: [facility];  Service: General;  Laterality: N/A;  c-arm     Family History  Problem Relation Age of Onset  . Schizophrenia Son   . Schizophrenia Brother   . Schizophrenia      2 nieces  . Diabetes Neg Hx   . Heart attack Neg Hx   . Colon cancer Neg Hx   .  Breast cancer Neg Hx     History  Substance Use Topics  . Smoking status: Never Smoker   . Smokeless tobacco: Never Used  . Alcohol Use: Yes     Comment: rarely    OB History    Grav Para Term Preterm Abortions TAB SAB Ect Mult Living                  Review of Systems  Constitutional: Negative for fever, chills and diaphoresis.  HENT: Negative for neck pain.   Respiratory: Negative for cough, chest tightness and shortness of breath.   Cardiovascular: Positive for chest pain. Negative for palpitations and leg swelling.  Gastrointestinal: Negative for nausea, vomiting and abdominal pain.  Genitourinary: Negative for dysuria.  Musculoskeletal: Negative for back pain.  Neurological: Negative for headaches.  Hematological: Does not bruise/bleed easily.  Psychiatric/Behavioral: Negative for confusion.  All other systems reviewed and are negative.    Allergies  Review of patient's allergies indicates no known allergies.  Home Medications   Current Outpatient Rx  Name  Route  Sig  Dispense  Refill  . ALPRAZOLAM 0.25 MG PO TABS   Oral   Take 0.125-0.25 mg by mouth at bedtime as needed. anxiety         . AMLODIPINE BESYLATE  5 MG PO TABS   Oral   Take 5 mg by mouth every morning.         . ASPIRIN 81 MG PO TABS   Oral   Take 81 mg by mouth at bedtime.          . ATORVASTATIN CALCIUM 20 MG PO TABS   Oral   Take 10 mg by mouth at bedtime.         . CASCARA SAGRADA PO   Oral   Take 3 capsules by mouth every other day.          Marland Kitchen VITAMIN B-12 1000 MCG PO TABS   Oral   Take 1,000 mcg by mouth daily.            BP 147/67  Pulse 70  Temp 98.7 F (37.1 C) (Oral)  Resp 18  Ht 5\' 5"  (1.651 m)  Wt 180 lb (81.647 kg)  BMI 29.95 kg/m2  SpO2 100%  Physical Exam  Nursing note and vitals reviewed. Constitutional: She is oriented to person, place, and time. She appears well-developed and well-nourished. No distress.  HENT:  Head: Normocephalic and  atraumatic.  Eyes: Conjunctivae normal are normal.  Neck: Normal range of motion. Neck supple.  Cardiovascular: Normal rate, normal heart sounds and intact distal pulses.   No murmur heard. Pulmonary/Chest: Effort normal and breath sounds normal. No respiratory distress. She exhibits no tenderness.  Abdominal: Soft. Bowel sounds are normal. She exhibits no distension. There is no tenderness.  Musculoskeletal: Normal range of motion. She exhibits no edema and no tenderness.  Neurological: She is alert and oriented to person, place, and time.  Skin: Skin is warm and dry.  Psychiatric: She has a normal mood and affect. Thought content normal.    ED Course  Procedures (including critical care time)   Labs Reviewed  CBC  COMPREHENSIVE METABOLIC PANEL  POCT I-STAT TROPONIN I  URINALYSIS, ROUTINE W REFLEX MICROSCOPIC   Dg Chest Portable 1 View  12/12/2011  *RADIOLOGY REPORT*  Clinical Data: Mid chest pain today  PORTABLE CHEST - 1 VIEW  Comparison: Chest x-ray of 10/08/2010 and CT chest of the same date.  Findings: No active infiltrate or effusion is seen.  Mediastinal contours are stable.  The heart is within upper limits normal in size.  No bony abnormality is seen, with mild degenerative change throughout the thoracic spine.  IMPRESSION: Stable chest x-ray.  No active lung disease.   Original Report Authenticated By: Dwyane Dee, M.D.      No diagnosis found.   ECG Normal sinus rhythm at 67 beats per minute. Normal axis. Normal intervals. Decreased R wave in V1 and V2, which was present on EKG from 10/08/2010. Normal.  ST and T-wave  MDM  Noncardiac cp No signs acs, pulm dz. sxs do not suggest gi src of pain. No hx trauma.  No evidence acute illness.         Cheri Guppy, MD 12/12/11 1108

## 2011-12-12 NOTE — Telephone Encounter (Signed)
Caller: Beena/Patient; Patient Name: Veronica [name]; PCP: Willow Ora; Best Callback Phone Number: [phone]  Today, 12/12/2011, pt calling with complaints of chest pain since 12/10/2011. Last night the pain woke her up. She has been having  continous chest pain  since 0500 this morning. No trouble breathing. RN advised to hang up and dial 911 for continous chest pain > 5 minutes per Chest Pain protocol and pt agreed. RN also spoke to Pt's daughter/ Maralyn Sago who was with the pt and advised to dial 911 and she agreed

## 2011-12-12 NOTE — Telephone Encounter (Signed)
Agree, check on her tomorrow

## 2011-12-12 NOTE — Telephone Encounter (Signed)
To MD for review     KP 

## 2011-12-13 NOTE — Telephone Encounter (Signed)
Spoke with pt. She states that she is feeling much better. I advised her to give [place] a call back if she needs anything.

## 2012-03-05 ENCOUNTER — Ambulatory Visit: Payer: BC Managed Care – PPO | Admitting: Internal Medicine

## 2012-03-05 DIAGNOSIS — Z0289 Encounter for other administrative examinations: Secondary | ICD-10-CM

## 2012-03-06 ENCOUNTER — Ambulatory Visit (INDEPENDENT_AMBULATORY_CARE_PROVIDER_SITE_OTHER): Payer: BC Managed Care – PPO | Admitting: Emergency Medicine

## 2012-03-06 VITALS — BP 123/75 | HR 71 | Temp 98.3°F | Resp 16 | Ht 65.25 in | Wt 200.0 lb

## 2012-03-06 DIAGNOSIS — I10 Essential (primary) hypertension: Secondary | ICD-10-CM

## 2012-03-06 NOTE — Progress Notes (Signed)
[facility] [address], [place] [address] [phone]  Date:  03/06/2012   Name:  Veronica [name]   DOB:  1950/02/24   MRN:  [id number]  PCP:  Willow Ora, MD    Chief Complaint: Hypertension   History of Present Illness:  Veronica [name] is a 62 y.o. very pleasant female patient who presents with the following:  Concerned about an upcoming trip to [place] that her blood pressure requires some extra precaution during the prolonged airline flight.  She is asymptomatic.  No other acute health concerns or problems.  Patient Active Problem List  Diagnosis  . HYPERLIPIDEMIA  . ANXIETY  . ALLERGIC RHINITIS  . CONSTIPATION, CHRONIC  . LEUKOPLAKIA OF VAGINA  . ACROCHORDON  . HIP PAIN, BILATERAL  . HTN (hypertension)  . GERD (gastroesophageal reflux disease)  . Annual physical exam    Past Medical History  Diagnosis Date  . Allergic rhinitis   . Anxiety and depression   . Hemorrhoids   . Constipation, chronic   . Uterine fibroid   . Low back pain     w/ left side radiculopathy, s/p injection at L4-L5 for[facility] 1/06  . Complication of anesthesia     WITH ONE SURGERY - WOKE UP SHAKING  . Coronary artery disease   . Hypertension   . GERD (gastroesophageal reflux disease)   . Anxiety   . Leukoplakia of vagina   . Acrochordon   . Hip pain, bilateral     Past Surgical History  Procedure Date  . Tubal ligation     bilateral 1989  . Abdominal hysterectomy     total abdominal, November 2002 (at [facility])  . Leep     unknown date   . Cholecystectomy 12/08/2010    Procedure: LAPAROSCOPIC CHOLECYSTECTOMY WITH INTRAOPERATIVE CHOLANGIOGRAM;  Surgeon: [name], MD;  Location: [facility];  Service: General;  Laterality: N/A;  c-arm     History  Substance Use Topics  . Smoking status: Never Smoker   . Smokeless tobacco: Never Used  . Alcohol Use: Yes     Comment: rarely    Family History  Problem Relation Age of Onset  . Schizophrenia Son    . Schizophrenia Brother   . Schizophrenia      2 nieces  . Diabetes Neg Hx   . Heart attack Neg Hx   . Colon cancer Neg Hx   . Breast cancer Neg Hx     No Known Allergies  Medication list has been reviewed and updated.  Current Outpatient Prescriptions on File Prior to Visit  Medication Sig Dispense Refill  . ALPRAZolam (XANAX) 0.25 MG tablet Take 0.125-0.25 mg by mouth at bedtime as needed. anxiety      . amLODipine (NORVASC) 5 MG tablet Take 5 mg by mouth every morning.      Marland Kitchen aspirin 81 MG tablet Take 81 mg by mouth at bedtime.       . CASCARA SAGRADA PO Take 3 capsules by mouth every other day.       . vitamin B-12 (CYANOCOBALAMIN) 1000 MCG tablet Take 1,000 mcg by mouth daily.       Marland Kitchen atorvastatin (LIPITOR) 20 MG tablet Take 10 mg by mouth at bedtime.        Review of Systems:  As per HPI, otherwise negative.    Physical Examination: Filed Vitals:   03/06/12 2056  BP: 123/75  Pulse: 71  Temp: 98.3 F (36.8 C)  Resp: 16   Filed Vitals:   03/06/12 2056  Height: 5' 5.25" (1.657 m)  Weight: 200 lb (90.719 kg)   Body mass index is 33.03 kg/(m^2). Ideal Body Weight: Weight in (lb) to have BMI = 25: 151.1   GEN: WDWN, NAD, Non-toxic, A & O x 3 HEENT: Atraumatic, Normocephalic. Neck supple. No masses, No LAD. Ears and Nose: No external deformity. CV: RRR, No M/G/R. No JVD. No thrill. No extra heart sounds. PULM: CTA B, no wheezes, crackles, rhonchi. No retractions. No resp. distress. No accessory muscle use. ABD: S, NT, ND, +BS. No rebound. No HSM. EXTR: No c/c/e NEURO Normal gait.  PSYCH: Normally interactive. Conversant. Not depressed or anxious appearing.  Calm demeanor.    Assessment and Plan: Hypertension Continue medication  Carmelina Dane, MD

## 2012-03-15 ENCOUNTER — Other Ambulatory Visit: Payer: Self-pay

## 2012-06-03 ENCOUNTER — Ambulatory Visit (INDEPENDENT_AMBULATORY_CARE_PROVIDER_SITE_OTHER): Payer: BC Managed Care – PPO | Admitting: Family Medicine

## 2012-06-03 ENCOUNTER — Encounter: Payer: Self-pay | Admitting: Family Medicine

## 2012-06-03 VITALS — BP 130/72 | HR 70 | Temp 98.0°F | Wt 199.6 lb

## 2012-06-03 DIAGNOSIS — E785 Hyperlipidemia, unspecified: Secondary | ICD-10-CM

## 2012-06-03 DIAGNOSIS — R51 Headache: Secondary | ICD-10-CM

## 2012-06-03 LAB — CBC WITH DIFFERENTIAL/PLATELET
Basophils Relative: 0.7 % (ref 0.0–3.0)
Eosinophils Absolute: 0.1 10*3/uL (ref 0.0–0.7)
HCT: 38 % (ref 36.0–46.0)
Lymphs Abs: 2.1 10*3/uL (ref 0.7–4.0)
MCHC: 34.8 g/dL (ref 30.0–36.0)
MCV: 87.1 fl (ref 78.0–100.0)
Monocytes Absolute: 0.4 10*3/uL (ref 0.1–1.0)
Monocytes Relative: 6.4 % (ref 3.0–12.0)
Neutro Abs: 3 10*3/uL (ref 1.4–7.7)
Neutrophils Relative %: 53.8 % (ref 43.0–77.0)
Platelets: 227 10*3/uL (ref 150.0–400.0)
RBC: 4.36 Mil/uL (ref 3.87–5.11)

## 2012-06-03 LAB — BASIC METABOLIC PANEL
BUN: 12 mg/dL (ref 6–23)
CO2: 26 mEq/L (ref 19–32)
Chloride: 102 mEq/L (ref 96–112)
Creatinine, Ser: 0.7 mg/dL (ref 0.4–1.2)
GFR: 92.15 mL/min (ref >60.00)
Potassium: 3.8 mEq/L (ref 3.5–5.1)
Sodium: 135 mEq/L (ref 135–145)

## 2012-06-03 LAB — SEDIMENTATION RATE: Sed Rate: 18 mm/hr (ref 0–22)

## 2012-06-03 LAB — LIPID PANEL
HDL: 43.5 mg/dL (ref >39.00)
VLDL: 26 mg/dL (ref 0.0–40.0)

## 2012-06-03 LAB — HEPATIC FUNCTION PANEL
Albumin: 4.4 g/dL (ref 3.5–5.2)
Bilirubin, Direct: 0 mg/dL (ref 0.0–0.3)
Total Protein: 7.6 g/dL (ref 6.0–8.3)

## 2012-06-03 LAB — VITAMIN B12: Vitamin B-12: 771 pg/mL (ref 211–911)

## 2012-06-03 NOTE — Patient Instructions (Signed)

## 2012-06-03 NOTE — Progress Notes (Signed)
  Subjective:    Patient ID: Veronica [name], female    DOB: 1950-09-28, 62 y.o.   MRN: [id number]  HPI Pt here c/o 10 days of sharp pains that come and go in temple area b/l.  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  Nausea started yesterday.   Pt feels like vision is worse--- saw opth 5 months ago.   No other symptoms     Review of Systems As above    Objective:   Physical Exam BP 130/72  Pulse 70  Temp(Src) 98 F (36.7 C) (Oral)  Wt 199 lb 9.6 oz (90.538 kg)  BMI 32.98 kg/m2  SpO2 97% General appearance: alert, cooperative, appears stated age and no distress Eyes: conjunctivae/corneas clear. PERRL, EOM's intact. Fundi benign. Ears: normal TM's and external ear canals both ears Nose: Nares normal. Septum midline. Mucosa normal. No drainage or sinus tenderness. Throat: lips, mucosa, and tongue normal; teeth and gums normal Neck: no adenopathy, no carotid bruit, no JVD, supple, symmetrical, trachea midline and thyroid not enlarged, symmetric, no tenderness/mass/nodules Lungs: clear to auscultation bilaterally Heart: S1, S2 normal Neurologic: Alert and oriented X 3, normal strength and tone. Normal symmetric reflexes. Normal coordination and gait       Assessment & Plan:  Sharp pains in temple area b/l=-- check CT and labs                                        See meds and orders

## 2012-06-04 ENCOUNTER — Other Ambulatory Visit: Payer: BC Managed Care – PPO

## 2012-06-04 ENCOUNTER — Other Ambulatory Visit: Payer: Self-pay

## 2012-06-04 LAB — ANA: Anti Nuclear Antibody(ANA): NEGATIVE

## 2012-06-04 MED ORDER — ATORVASTATIN CALCIUM 20 MG PO TABS: 20.0000 mg | ORAL_TABLET | Freq: Every day | ORAL | Status: DC

## 2012-06-09 ENCOUNTER — Ambulatory Visit (INDEPENDENT_AMBULATORY_CARE_PROVIDER_SITE_OTHER)
Admission: RE | Admit: 2012-06-09 | Discharge: 2012-06-09 | Disposition: A | Discharge status: Home / Self Care | Payer: BC Managed Care – PPO | Source: Ambulatory Visit | Attending: Family Medicine | Admitting: Family Medicine

## 2012-06-09 ENCOUNTER — Telehealth: Payer: Self-pay | Admitting: Cardiology

## 2012-06-09 DIAGNOSIS — R51 Headache: Secondary | ICD-10-CM

## 2012-06-09 NOTE — Telephone Encounter (Signed)
Pt Signed ROI, Mailed Stress Test to pt Home Address  06/09/12/KM

## 2012-08-15 ENCOUNTER — Other Ambulatory Visit: Payer: Self-pay | Admitting: Internal Medicine

## 2012-08-15 DIAGNOSIS — Z1231 Encounter for screening mammogram for malignant neoplasm of breast: Secondary | ICD-10-CM

## 2012-09-01 ENCOUNTER — Encounter: Payer: Self-pay | Admitting: *Deleted

## 2012-09-01 ENCOUNTER — Encounter: Payer: Self-pay | Admitting: Internal Medicine

## 2012-09-01 ENCOUNTER — Ambulatory Visit (INDEPENDENT_AMBULATORY_CARE_PROVIDER_SITE_OTHER): Payer: BC Managed Care – PPO | Admitting: Internal Medicine

## 2012-09-01 VITALS — BP 120/70 | HR 72 | Temp 98.1°F | Ht 64.75 in | Wt 203.6 lb

## 2012-09-01 DIAGNOSIS — F411 Generalized anxiety disorder: Secondary | ICD-10-CM

## 2012-09-01 DIAGNOSIS — K5909 Other constipation: Secondary | ICD-10-CM

## 2012-09-01 DIAGNOSIS — Z0000 Encounter for general adult medical examination without abnormal findings: Secondary | ICD-10-CM

## 2012-09-01 DIAGNOSIS — I10 Essential (primary) hypertension: Secondary | ICD-10-CM

## 2012-09-01 DIAGNOSIS — E785 Hyperlipidemia, unspecified: Secondary | ICD-10-CM

## 2012-09-01 DIAGNOSIS — Z23 Encounter for immunization: Secondary | ICD-10-CM

## 2012-09-01 DIAGNOSIS — Z2911 Encounter for prophylactic immunotherapy for respiratory syncytial virus (RSV): Secondary | ICD-10-CM

## 2012-09-01 LAB — ALT: ALT: 24 U/L (ref 0–35)

## 2012-09-01 LAB — LIPID PANEL
Cholesterol: 139 mg/dL (ref 0–200)
HDL: 48 mg/dL (ref >39.00)
Triglycerides: 90 mg/dL (ref 0.0–149.0)

## 2012-09-01 LAB — AST: AST: 21 U/L (ref 0–37)

## 2012-09-01 MED ORDER — ALPRAZOLAM 0.25 MG PO TABS: 0.1250 mg | ORAL_TABLET | Freq: Every evening | ORAL | Status: DC | PRN

## 2012-09-01 MED ORDER — AMLODIPINE BESYLATE 5 MG PO TABS: 5.0000 mg | ORAL_TABLET | Freq: Every morning | ORAL | Status: DC

## 2012-09-01 MED ORDER — ATORVASTATIN CALCIUM 20 MG PO TABS: 20.0000 mg | ORAL_TABLET | Freq: Every day | ORAL | Status: DC

## 2012-09-01 NOTE — Assessment & Plan Note (Addendum)
Well controlled, labs  

## 2012-09-01 NOTE — Assessment & Plan Note (Addendum)
Td-- declined to have  , benefits discussed  zostavax -- benefits discussed, will provide today  Colonoscopy ,  Dr. Sherin Quarry, October 26, 2003---> Left-sided diverticulosis Per GI note, next screening colonoscopy 2015. (Dr. Danise Edge)  [phone]  mammogram (-)  hysterectomy 2002 for bleeding  Saw  gynecology ~ 1 year ago, already got a letter, due to see them again Discussed diet and exercise DEXA 07-2011 normal Labs from 05-2012 reviewed  Diet-exercise discussed

## 2012-09-01 NOTE — Assessment & Plan Note (Addendum)
Sx "for years", not worse by useng amlodipine rec metamucil

## 2012-09-01 NOTE — Assessment & Plan Note (Addendum)
On xanax prn Needs UDS

## 2012-09-01 NOTE — Assessment & Plan Note (Signed)
Lipitor dose increased base on a cholesterol panel from 5/20134 Plan: FLP, AST, ALT

## 2012-09-01 NOTE — Progress Notes (Signed)
  Subjective:    Patient ID: Veronica [name], female    DOB: [birth year], 62 y.o.   MRN: [id number]  HPI CPX   Past Medical History  Diagnosis Date  . Allergic rhinitis   . Anxiety and depression   . Hemorrhoids   . Constipation, chronic   . Uterine fibroid   . Low back pain     w/ left side radiculopathy, s/p injection at L4-L5 [facility] 1/06  . Coronary artery disease     06-2010, had CP----> Mild nonobstructive CAD on cardiac CT   . Hypertension   . GERD (gastroesophageal reflux disease)   . Leukoplakia of vagina   . Hip pain, bilateral   . Hyperlipidemia    Past Surgical History  Procedure Laterality Date  . Tubal ligation      bilateral 1989  . Abdominal hysterectomy  11-2000  . Leep      unknown date   . Cholecystectomy  12/08/2010    Procedure: LAPAROSCOPIC CHOLECYSTECTOMY WITH INTRAOPERATIVE CHOLANGIOGRAM;  Surgeon: [name], MD;  Location: [facility];  Service: General;  Laterality: N/A;  c-arm    History   Social History  . Marital Status: Divorced    Spouse Name: N/A    Number of Children: 3  . 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 Smoking status: Never Smoker   . Smokeless tobacco: Never Used  . Alcohol Use: Yes     Comment: rarely  . Drug Use: No  . Sexually Active: No   Other Topics Concern  . Not on file   Social History Narrative   Divorced, lives w/ 2 of her children   Original from [place], living in [place] since age 38                  Family History  Problem Relation Age of Onset  . Schizophrenia Son   . Schizophrenia Brother   . Schizophrenia Other     2 nieces  . Diabetes Neg Hx   . Heart attack Neg Hx   . Colon cancer Neg Hx   . Breast cancer Neg Hx    Review of Systems Diet And exercise--not very good in the last few weeks since she's not working (school is out)  Denies chest pain or shortness or breath No nausea, vomiting, diarrhea. No dysuria  or gross hematuria. Constipation, ongoing problem. Anxiety well controlled with as needed Xanax, denies depression.      Objective:   Physical Exam BP 120/70  Pulse 72  Temp(Src) 98.1 F (36.7 C) (Oral)  Ht 5' 4.75" (1.645 m)  Wt 203 lb 9.6 oz (92.352 kg)  BMI 34.13 kg/m2  SpO2 97%  General -- alert, well-developed, NAD   Neck --no thyromegaly , normal carotid pulse Lungs -- normal respiratory effort, no intercostal retractions, no accessory muscle use, and normal breath sounds.   Heart-- normal rate, regular rhythm, no murmur, and no gallop.   Abdomen--soft, non-tender, no distention, no masses.   Extremities-- no pretibial edema bilaterally  Neurologic-- alert & oriented X3 and strength normal in all extremities. Psych-- Cognition and judgment appear intact. Alert and cooperative with normal attention span and concentration.  not anxious appearing and not depressed appearing.       Assessment & Plan:

## 2012-09-02 NOTE — Addendum Note (Signed)
Addended by: Silvio Pate D on: 09/02/2012 09:58 AM   Modules accepted: Orders

## 2012-09-02 NOTE — Addendum Note (Signed)
Addended by: Silvio Pate D on: 09/02/2012 09:39 AM   Modules accepted: Orders

## 2012-09-03 IMAGING — CT CT ANGIO CHEST
1 of 3 series · 19 of 32 positions shown · IV contrast (80 ML OMNI 300)
Comparison: 10/08/2010

CLINICAL DATA: Chest pain for 1 day.  Shortness of breath.

CT ANGIOGRAPHY CHEST WITH CONTRAST
TECHNIQUE: Multidetector CT imaging of the chest was performed
using the standard protocol during bolus administration of
intravenous contrast.  Multiplanar CT image reconstructions
including MIPs were obtained to evaluate the vascular anatomy.
Contrast:  80 ml [id number]

[Series 7: thins for pacs · axial · 0.70mm/px · z∈[-203,+42]mm · 19 of 275 slices shown]
[im 15/275  lung]
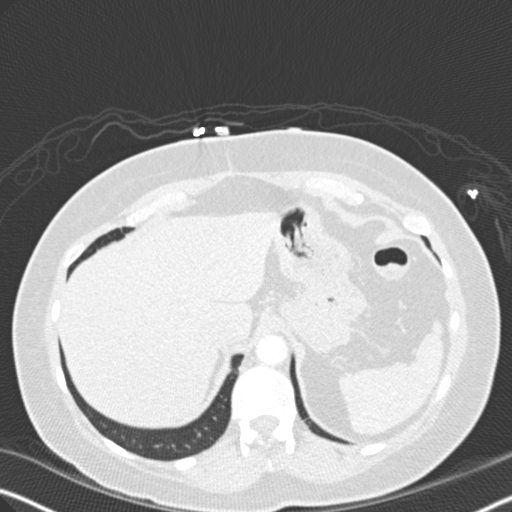
[im 29/275  mediastinal]
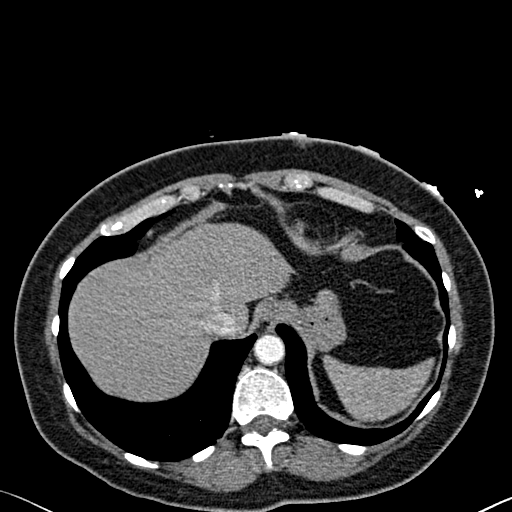
[im 44/275  lung]
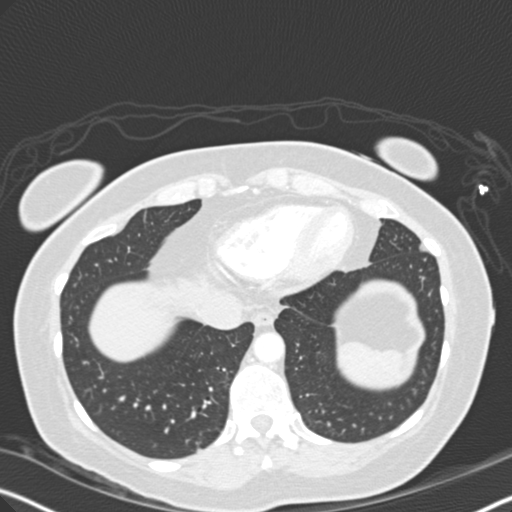
[im 58/275  mediastinal]
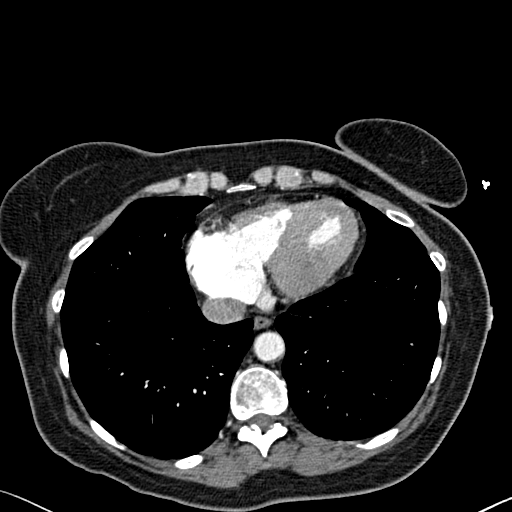
[im 73/275  lung]
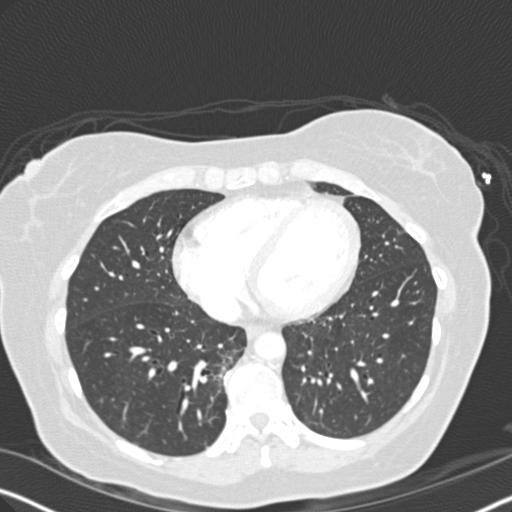
[im 92/275  mediastinal]
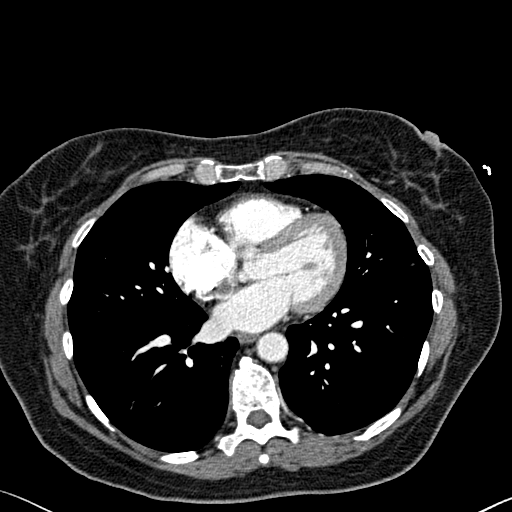
[im 101/275  lung]
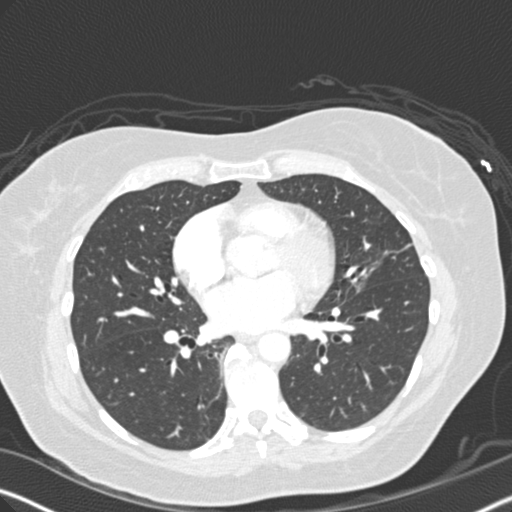
[im 110/275  mediastinal]
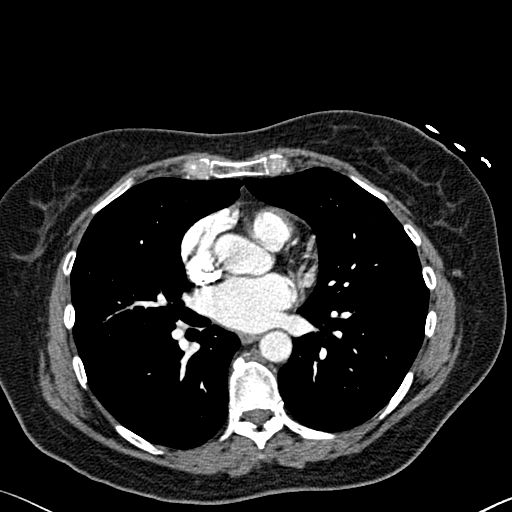
[im 116/275  lung]
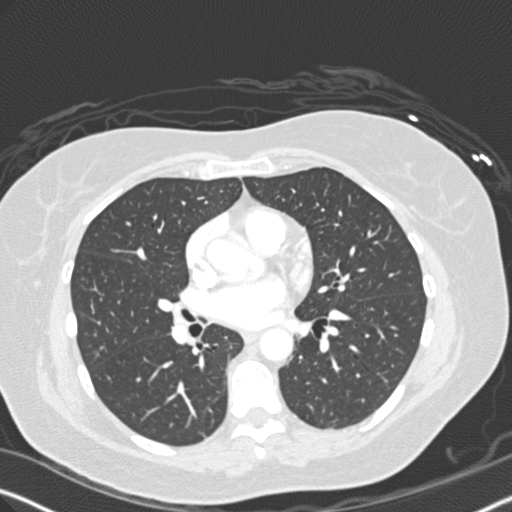
[im 130/275  mediastinal]
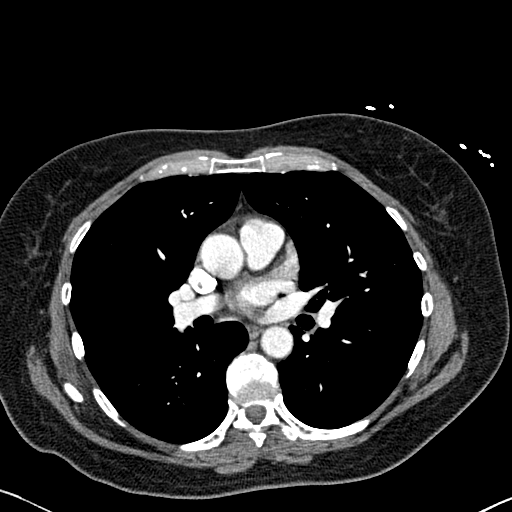
[im 145/275  lung]
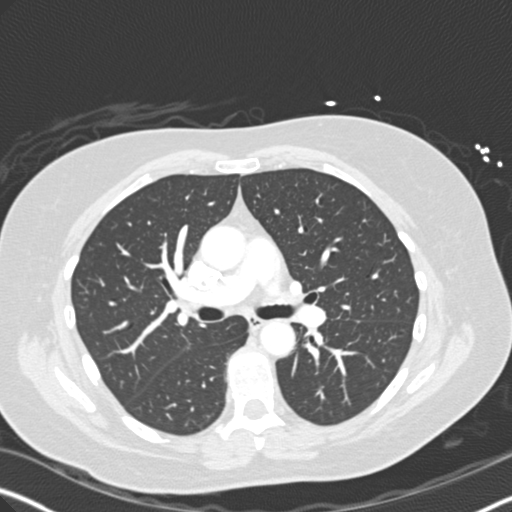
[im 159/275  mediastinal]
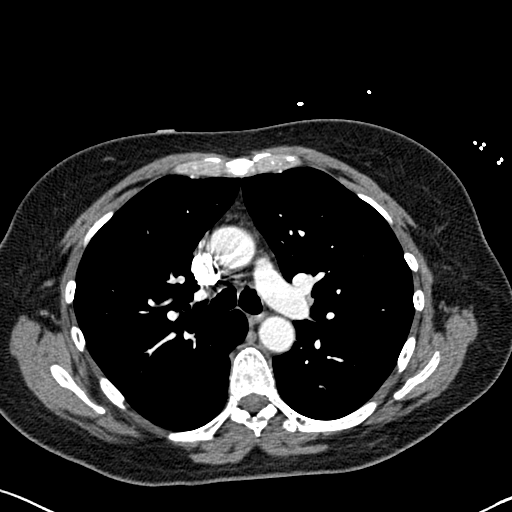
[im 174/275  lung]
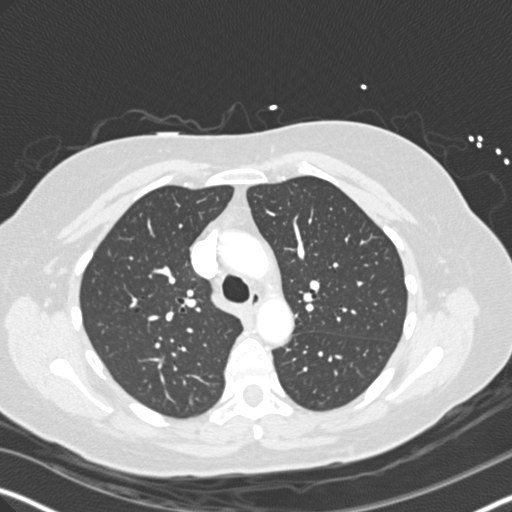
[im 183/275  mediastinal]
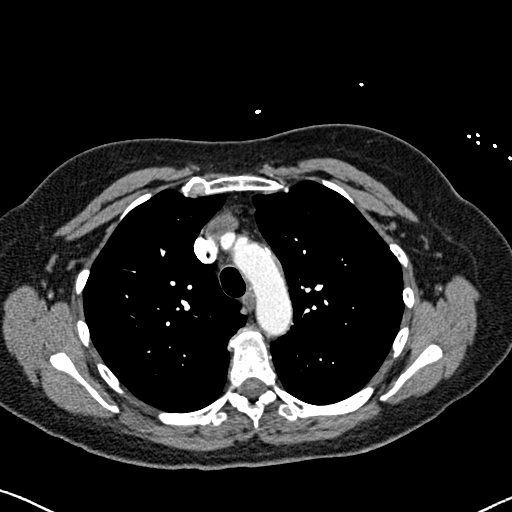
[im 202/275  lung]
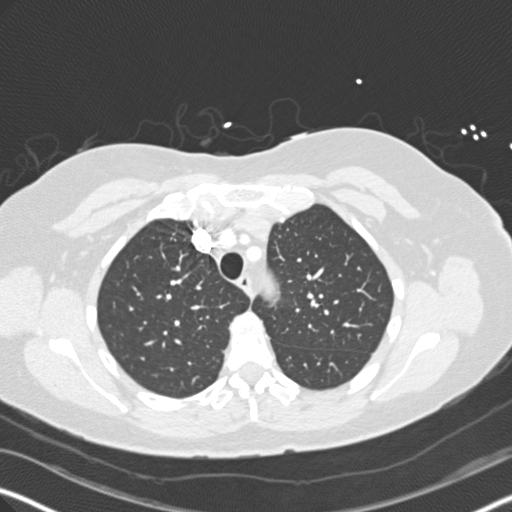
[im 217/275  mediastinal]
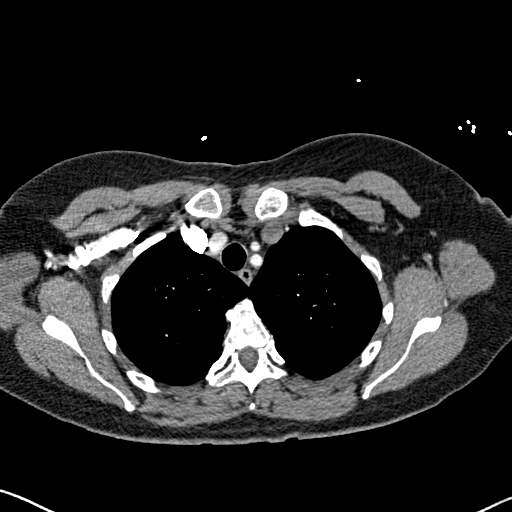
[im 231/275  lung]
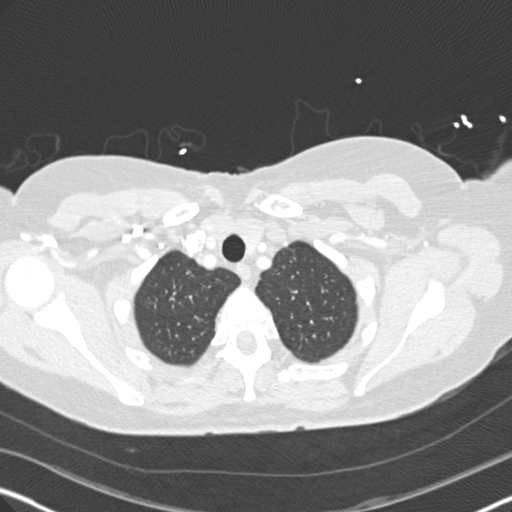
[im 246/275  mediastinal]
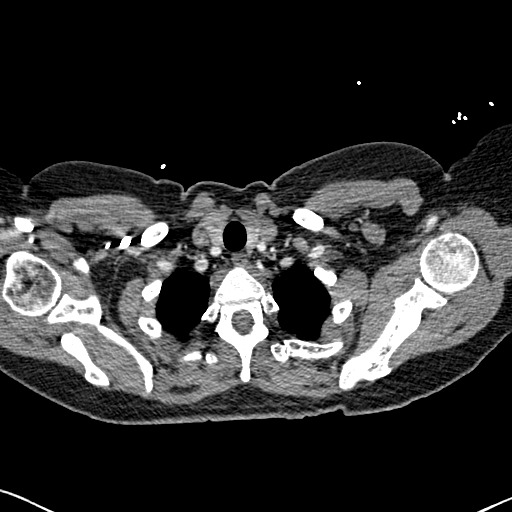
[im 260/275  lung]
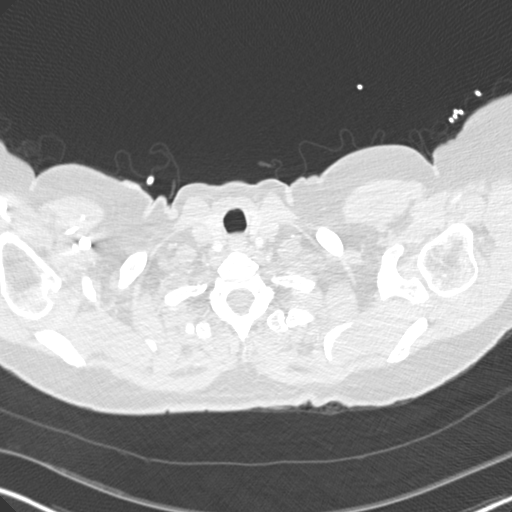

[19 of 32 positions shown; findings below may reference images not displayed]

FINDINGS: No filling defect is identified in the pulmonary
arterial tree to suggest pulmonary embolus.

No aortic dissection is identified.  No pericardial effusion noted.

No pathologic thoracic adenopathy is observed.  There is linear
subsegmental atelectasis in the lingula.

Mild thoracic spondylosis noted.

Review of the MIP images confirms the above findings.
IMPRESSION: 1. No filling defect is identified in the pulmonary arterial tree
to suggest pulmonary embolus.
2.  Linear subsegmental atelectasis in the lingula.

## 2012-09-15 ENCOUNTER — Ambulatory Visit (HOSPITAL_COMMUNITY)
Admission: RE | Admit: 2012-09-15 | Discharge: 2012-09-15 | Disposition: A | Discharge status: Home / Self Care | Payer: BC Managed Care – PPO | Source: Ambulatory Visit | Attending: Internal Medicine | Admitting: Internal Medicine

## 2012-09-15 DIAGNOSIS — Z1231 Encounter for screening mammogram for malignant neoplasm of breast: Secondary | ICD-10-CM

## 2012-09-17 ENCOUNTER — Other Ambulatory Visit: Payer: Self-pay | Admitting: Internal Medicine

## 2012-09-17 DIAGNOSIS — R928 Other abnormal and inconclusive findings on diagnostic imaging of breast: Secondary | ICD-10-CM

## 2012-09-18 ENCOUNTER — Telehealth: Payer: Self-pay | Admitting: Internal Medicine

## 2012-09-18 NOTE — Telephone Encounter (Signed)
Discussed with patient, verbalized understanding. Also reviewed lab result from 09/02/12 with patient per her request due to not knowing how to access mychart.

## 2012-09-18 NOTE — Telephone Encounter (Signed)
Please advise if able. Mammogram was done yesterday.

## 2012-09-18 NOTE — Telephone Encounter (Signed)
Results not available to me at this time. Recommend to call the radiology Center

## 2012-09-18 NOTE — Telephone Encounter (Signed)
Patient is calling for the results from her breast exam. Please advise.

## 2012-09-18 NOTE — Telephone Encounter (Signed)
lmovm to return call  °

## 2012-09-26 ENCOUNTER — Other Ambulatory Visit: Payer: Self-pay | Admitting: Internal Medicine

## 2012-09-26 ENCOUNTER — Ambulatory Visit
Admission: RE | Admit: 2012-09-26 | Discharge: 2012-09-26 | Disposition: A | Discharge status: Home / Self Care | Payer: BC Managed Care – PPO | Source: Ambulatory Visit | Attending: Internal Medicine | Admitting: Internal Medicine

## 2012-09-26 DIAGNOSIS — R928 Other abnormal and inconclusive findings on diagnostic imaging of breast: Secondary | ICD-10-CM

## 2012-10-03 ENCOUNTER — Other Ambulatory Visit: Payer: Self-pay | Admitting: Internal Medicine

## 2012-10-10 ENCOUNTER — Encounter: Payer: Self-pay | Admitting: Internal Medicine

## 2012-11-21 ENCOUNTER — Ambulatory Visit (INDEPENDENT_AMBULATORY_CARE_PROVIDER_SITE_OTHER): Payer: BC Managed Care – PPO | Admitting: Physician Assistant

## 2012-11-21 VITALS — BP 124/72 | HR 86 | Temp 98.6°F | Resp 20 | Ht 64.5 in | Wt 202.8 lb

## 2012-11-21 DIAGNOSIS — J069 Acute upper respiratory infection, unspecified: Secondary | ICD-10-CM

## 2012-11-21 MED ORDER — BENZONATATE 100 MG PO CAPS: 100.0000 mg | ORAL_CAPSULE | Freq: Three times a day (TID) | ORAL | Status: DC | PRN

## 2012-11-21 MED ORDER — ALBUTEROL SULFATE HFA 108 (90 BASE) MCG/ACT IN AERS: 2.0000 | INHALATION_SPRAY | RESPIRATORY_TRACT | Status: DC | PRN

## 2012-11-21 MED ORDER — FLUTICASONE PROPIONATE 50 MCG/ACT NA SUSP: 2.0000 | Freq: Every day | NASAL | Status: DC

## 2012-11-21 NOTE — Patient Instructions (Signed)
Your exam is reassuring today.  Begin using the cough medicine every 8 hours as needed for cough.  Use the nasal spray once daily (2 sprays each nostril).  Use the inhaler as needed for cough or shortness of breath.  Continue taking mucinex.  Continue drinking plenty of fluids (water is best!) and rest.  If you are having fevers or your cough gets worse, please let us know.   Upper Respiratory Infection, Adult An upper respiratory infection (URI) is also sometimes known as the common cold. The upper respiratory tract includes the nose, sinuses, throat, trachea, and bronchi. Bronchi are the airways leading to the lungs. Most people improve within 1 week, but symptoms can last up to 2 weeks. A residual cough may last even longer.  CAUSES Many different viruses can infect the tissues lining the upper respiratory tract. The tissues become irritated and inflamed and often become very moist. Mucus production is also common. A cold is contagious. You can easily spread the virus to others by oral contact. This includes kissing, sharing a glass, coughing, or sneezing. Touching your mouth or nose and then touching a surface, which is then touched by another person, can also spread the virus. SYMPTOMS  Symptoms typically develop 1 to 3 days after you come in contact with a cold virus. Symptoms vary from person to person. They may include:  Runny nose.  Sneezing.  Nasal congestion.  Sinus irritation.  Sore throat.  Loss of voice (laryngitis).  Cough.  Fatigue.  Muscle aches.  Loss of appetite.  Headache.  Low-grade fever. DIAGNOSIS  You might diagnose your own cold based on familiar symptoms, since most people get a cold 2 to 3 times a year. Your caregiver can confirm this based on your exam. Most importantly, your caregiver can check that your symptoms are not due to another disease such as strep throat, sinusitis, pneumonia, asthma, or epiglottitis. Blood tests, throat tests, and X-rays are  not necessary to diagnose a common cold, but they may sometimes be helpful in excluding other more serious diseases. Your caregiver will decide if any further tests are required. RISKS AND COMPLICATIONS  You may be at risk for a more severe case of the common cold if you smoke cigarettes, have chronic heart disease (such as heart failure) or lung disease (such as asthma), or if you have a weakened immune system. The very young and very old are also at risk for more serious infections. Bacterial sinusitis, middle ear infections, and bacterial pneumonia can complicate the common cold. The common cold can worsen asthma and chronic obstructive pulmonary disease (COPD). Sometimes, these complications can require emergency medical care and may be life-threatening. PREVENTION  The best way to protect against getting a cold is to practice good hygiene. Avoid oral or hand contact with people with cold symptoms. Wash your hands often if contact occurs. There is no clear evidence that vitamin C, vitamin E, echinacea, or exercise reduces the chance of developing a cold. However, it is always recommended to get plenty of rest and practice good nutrition. TREATMENT  Treatment is directed at relieving symptoms. There is no cure. Antibiotics are not effective, because the infection is caused by a virus, not by bacteria. Treatment may include:  Increased fluid intake. Sports drinks offer valuable electrolytes, sugars, and fluids.  Breathing heated mist or steam (vaporizer or shower).  Eating chicken soup or other clear broths, and maintaining good nutrition.  Getting plenty of rest.  Using gargles or lozenges for comfort.  Controlling fevers with ibuprofen or acetaminophen as directed by your caregiver.  Increasing usage of your inhaler if you have asthma. Zinc gel and zinc lozenges, taken in the first 24 hours of the common cold, can shorten the duration and lessen the severity of symptoms. Pain medicines may  help with fever, muscle aches, and throat pain. A variety of non-prescription medicines are available to treat congestion and runny nose. Your caregiver can make recommendations and may suggest nasal or lung inhalers for other symptoms.  HOME CARE INSTRUCTIONS   Only take over-the-counter or prescription medicines for pain, discomfort, or fever as directed by your caregiver.  Use a warm mist humidifier or inhale steam from a shower to increase air moisture. This may keep secretions moist and make it easier to breathe.  Drink enough water and fluids to keep your urine clear or pale yellow.  Rest as needed.  Return to work when your temperature has returned to normal or as your caregiver advises. You may need to stay home longer to avoid infecting others. You can also use a face mask and careful hand washing to prevent spread of the virus. SEEK MEDICAL CARE IF:   After the first few days, you feel you are getting worse rather than better.  You need your caregiver's advice about medicines to control symptoms.  You develop chills, worsening shortness of breath, or brown or red sputum. These may be signs of pneumonia.  You develop yellow or brown nasal discharge or pain in the face, especially when you bend forward. These may be signs of sinusitis.  You develop a fever, swollen neck glands, pain with swallowing, or white areas in the back of your throat. These may be signs of strep throat. SEEK IMMEDIATE MEDICAL CARE IF:   You have a fever.  You develop severe or persistent headache, ear pain, sinus pain, or chest pain.  You develop wheezing, a prolonged cough, cough up blood, or have a change in your usual mucus (if you have chronic lung disease).  You develop sore muscles or a stiff neck. Document Released: 07/11/2000 Document Revised: 04/09/2011 Document Reviewed: 05/19/2010 [facility]Exit[facility] Patient Information 2014 [employer], [place].

## 2012-11-21 NOTE — Progress Notes (Signed)
  Subjective:    Patient ID: Veronica [name], female    DOB: 04-27-1950, 62 y.o.   MRN: [id number]  HPI   Veronica [name] is a very pleasant 62 yr old female here with concern for illness.  Reports URI symptoms for 7 days.  Symptoms include congestion, muffled hearing on the left, nasal drainage, chest congestion, nonproductive cough, sore throat.  She has occ SOB but no wheezing.  No asthma history.  No smoking.  No fever or chills.  Has been treating symptoms with honey, lemon, vitamin c, tea.  Has also tried mucinex without relief.  States symptoms are not improving.  Chest congestion is most bothersome symptoms at this point.      Review of Systems  Constitutional: Negative for fever and chills.  HENT: Positive for congestion, hearing loss (muffled, left), rhinorrhea and sore throat. Negative for ear pain and sinus pressure.   Respiratory: Positive for cough and shortness of breath. Negative for wheezing.   Cardiovascular: Negative.   Gastrointestinal: Negative.   Musculoskeletal: Negative.   Skin: Negative.   Neurological: Negative.        Objective:   Physical Exam  Vitals reviewed. Constitutional: She is oriented to person, place, and time. She appears well-developed and well-nourished. No distress.  HENT:  Head: Normocephalic and atraumatic.  Right Ear: Tympanic membrane and ear canal normal.  Left Ear: Tympanic membrane and ear canal normal.  Nose: Mucosal edema and rhinorrhea present. Right sinus exhibits no maxillary sinus tenderness and no frontal sinus tenderness. Left sinus exhibits no maxillary sinus tenderness and no frontal sinus tenderness.  Mouth/Throat: Uvula is midline, oropharynx is clear and moist and mucous membranes are normal.  Eyes: Conjunctivae are normal. No scleral icterus.  Neck: Neck supple.  Cardiovascular: Normal rate, regular rhythm and normal heart sounds.   Pulmonary/Chest: Effort normal and breath sounds normal. She has no wheezes. She has no rhonchi. She  has no rales.  Lymphadenopathy:    She has no cervical adenopathy.  Neurological: She is alert and oriented to person, place, and time.  Skin: Skin is warm and dry.  Psychiatric: She has a normal mood and affect. Her behavior is normal.        Assessment & Plan:  Viral URI with cough - Plan: fluticasone (FLONASE) 50 MCG/ACT nasal spray, albuterol (PROVENTIL HFA;VENTOLIN HFA) 108 (90 BASE) MCG/ACT inhaler, benzonatate (TESSALON) 100 MG capsule   Veronica [name] is a very pleasant 62 yr old female here with 7 days of URI symptoms.  She is afebrile, SpO2 is 100% on RA, lungs CTA bilat, throat clear.  Suspect viral etiology.  Will treat symptoms with Flonase, albuterol, Tessalon.  Continue mucinex.  Push fluids, rest.  Work note provided if needed.  IF any symptoms worsening - fever, worsening cough, worsening SOB, etc - pt to call or RTC.  Would send azithro if symptoms worsening or not improving.

## 2012-12-04 ENCOUNTER — Other Ambulatory Visit: Payer: Self-pay

## 2012-12-04 ENCOUNTER — Ambulatory Visit (INDEPENDENT_AMBULATORY_CARE_PROVIDER_SITE_OTHER): Payer: BC Managed Care – PPO | Admitting: Family Medicine

## 2012-12-04 VITALS — BP 132/76 | HR 65 | Temp 97.7°F | Resp 16 | Ht 64.5 in | Wt 203.0 lb

## 2012-12-04 DIAGNOSIS — J209 Acute bronchitis, unspecified: Secondary | ICD-10-CM

## 2012-12-04 DIAGNOSIS — J069 Acute upper respiratory infection, unspecified: Secondary | ICD-10-CM

## 2012-12-04 DIAGNOSIS — L723 Sebaceous cyst: Secondary | ICD-10-CM

## 2012-12-04 DIAGNOSIS — L720 Epidermal cyst: Secondary | ICD-10-CM

## 2012-12-04 MED ORDER — DOXYCYCLINE HYCLATE 100 MG PO CAPS: 100.0000 mg | ORAL_CAPSULE | Freq: Two times a day (BID) | ORAL | Status: DC

## 2012-12-04 NOTE — Progress Notes (Signed)
Procedure:  Consent obtained.  Local anesthesia with 2% lido with epi.  Betadine prep.  #11 blade used to make 1 cm horizontal incision - small amount of purulent sebaceous material was expressed as well as a gelatinous material.  Cyst wall was not visible.  Wound was packed with 1/4in plain packing and a drsg was placed.  She will RTC in 48h for packing change.  I am hoping that the packing will remove the gelatinous material.

## 2012-12-04 NOTE — Progress Notes (Signed)
[address]   [place], [place]  [address]   [phone] Subjective:    Patient ID: Veronica [name], female    DOB: 1950-10-05, 62 y.o.   MRN: [id number]  This chart was scribed for Ethelda Chick, MD by Blanchard Kelch, ED Scribe. The patient was seen in room 8. Patient's care was started at 6:46 PM.   HPI  Veronica [name] is a 62 y.o. female who presents to office for a follow up post visit 10/24 by Frances Furbish, PA-C. She was diagnosed with a viral URI and prescribed Flonase, Albuterol and Tessalon Perles.   She is still complaining of a mild cough and sore throat, but is feeling much better overall. She denies ear pain.  She is mainly concerned about a erythematous cyst on her left epigastric region of her abdomen. It has been constantly swollen, but the swelling has improved mildly. She tried squeezing it once but denies any drainage. She states that she has had it for years but it comes and goes. She denies fever.     Past Medical History  Diagnosis Date   Allergic rhinitis    Anxiety and depression    Hemorrhoids    Constipation, chronic    Uterine fibroid    Low back pain     w/ left side radiculopathy, s/p injection at L4-L5 forarmen 1/06   Coronary artery disease     06-2010, had CP----> Mild nonobstructive CAD on cardiac CT    Hypertension    GERD (gastroesophageal reflux disease)    Leukoplakia of vagina    Hip pain, bilateral    Hyperlipidemia    Past Surgical History  Procedure Laterality Date   Tubal ligation      bilateral 1989   Abdominal hysterectomy  11-2000   Leep      unknown date    Cholecystectomy  12/08/2010    Procedure: LAPAROSCOPIC CHOLECYSTECTOMY WITH INTRAOPERATIVE CHOLANGIOGRAM;  Surgeon: [name], MD;  Location: [facility];  Service: General;  Laterality: N/A;  c-arm    Family History  Problem Relation Age of Onset   Schizophrenia Son    Schizophrenia Brother    Schizophrenia Other     2 nieces   Diabetes Neg Hx     Heart attack Neg Hx    Colon cancer Neg Hx    Breast cancer Neg Hx    History   Social History   Marital Status: Divorced    Spouse Name: N/A    Number of Children: 3   Years of Education: N/A   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 anxiety  30 tablet  5   amLODipine (NORVASC) 5 MG tablet Take 1 tablet (5 mg total) by  mouth every morning.  30 tablet  5   aspirin 81 MG tablet Take 81 mg by mouth at bedtime.        atorvastatin (LIPITOR) 20 MG tablet Take 1 tablet (20 mg total) by mouth at bedtime.  30 tablet  5   CASCARA SAGRADA PO Take 3 capsules by mouth every other day.        fish oil-omega-3 fatty acids 1000 MG capsule Take 1 g by mouth daily.       vitamin B-12 (CYANOCOBALAMIN) 1000 MCG tablet Take 1,000 mcg by mouth daily.        albuterol (PROVENTIL HFA;VENTOLIN HFA) 108 (90 BASE) MCG/ACT inhaler Inhale 2 puffs into the lungs every 4 (four) hours as needed for wheezing (cough, shortness of breath or wheezing.).  1 Inhaler  1   No current facility-administered medications on file prior to visit.     Review of Systems  Constitutional: Negative for fever.  HENT: Positive for sore throat. Negative for ear pain.   Respiratory: Positive for cough.   Skin:       Positive for cyst.       Objective:   Physical Exam  Nursing note and vitals reviewed. Constitutional: She is  oriented to person, place, and time. She appears well-developed and well-nourished. No distress.  HENT:  Head: Normocephalic and atraumatic.  Mouth/Throat: Uvula is midline. Posterior oropharyngeal erythema present. No oropharyngeal exudate.  Mild post oropharyngeal drainage.  Eyes: EOM are normal.  Neck: Neck supple. No tracheal deviation present.  Cardiovascular: Normal rate, regular rhythm and normal heart sounds.   No murmur heard. Pulmonary/Chest: Effort normal and breath sounds normal. No respiratory distress. She has no wheezes. She has no rales.  Abdominal: Soft.  Musculoskeletal: Normal range of motion.  Neurological: She is alert and oriented to person, place, and time.  Skin: Skin is warm and dry.  1.5x1 cm cystic lesion on upper abdomen, to the left. The inferior aspect has slight erythema. Scant purulent drainage expressed. Positive tenderness to palpation.  Psychiatric: She has a normal mood and affect. Her behavior is normal.    See separate procedure note.    Assessment & Plan:  Cyst of skin and subcutaneous tissue - Plan: Wound culture  URI (upper respiratory infection)  1. Sebaceous cyst abdomen:  New.  S/p I&D by Benny Lennert, PA-C with packing placement; to return in 48 hours for removal of packing. Rx for Doxycycline provided.   2.  URI: improving; continue Flonase, Tessalon Perles.    Meds ordered this encounter  Medications   DISCONTD: doxycycline (VIBRAMYCIN) 100 MG capsule    Sig: Take 1 capsule (100 mg total) by mouth 2 (two) times daily.    Dispense:  20 capsule    Refill:  0    I personally performed the services described in this documentation, which was scribed in my presence.  The recorded information has been reviewed and is accurate.  Nilda Simmer, M.D.  [facility] [address] [place], [place]  [address] [phone] phone [phone] fax

## 2012-12-06 ENCOUNTER — Ambulatory Visit (INDEPENDENT_AMBULATORY_CARE_PROVIDER_SITE_OTHER): Payer: BC Managed Care – PPO | Admitting: Physician Assistant

## 2012-12-06 VITALS — BP 114/68 | HR 64 | Temp 98.0°F | Resp 16 | Ht 64.5 in | Wt 202.0 lb

## 2012-12-06 DIAGNOSIS — L03319 Cellulitis of trunk, unspecified: Secondary | ICD-10-CM

## 2012-12-06 DIAGNOSIS — IMO0002 Reserved for concepts with insufficient information to code with codable children: Secondary | ICD-10-CM

## 2012-12-06 DIAGNOSIS — L02219 Cutaneous abscess of trunk, unspecified: Secondary | ICD-10-CM

## 2012-12-06 NOTE — Progress Notes (Signed)
Patient ID: KASAUNDRA FAHRNEY MRN: [id number], DOB: 09-Sep-1950 62 y.o. Date of Encounter: 12/06/2012, 2:26 PM  Chief Complaint: Wound care   See previous note  HPI: 62 y.o. y/o female presents for wound care s/p I&D on 12/04/12.  Doing well No issues or complaints Afebrile/ no chills No nausea or vomiting Tolerating doxycycline.  Pain resolved.  Daily dressing change Previous note reviewed  Past Medical History  Diagnosis Date  . Allergic rhinitis   . Anxiety and depression   . Hemorrhoids   . Constipation, chronic   . Uterine fibroid   . Low back pain     w/ left side radiculopathy, s/p injection at L4-L5 forarmen 1/06  . Coronary artery disease     06-2010, had CP----> Mild nonobstructive CAD on cardiac CT   . Hypertension   . GERD (gastroesophageal reflux disease)   . Leukoplakia of vagina   . Hip pain, bilateral   . Hyperlipidemia      Home Meds: Prior to Admission medications   Medication Sig Start Date End Date Taking? Authorizing Provider  ALPRAZolam (XANAX) 0.25 MG tablet Take 0.5-1 tablets (0.125-0.25 mg total) by mouth at bedtime as needed. anxiety 09/01/12  Yes Wanda Plump, MD  amLODipine (NORVASC) 5 MG tablet Take 1 tablet (5 mg total) by mouth every morning. 09/01/12  Yes Wanda Plump, MD  benzonatate (TESSALON) 100 MG capsule Take 1-2 capsules (100-200 mg total) by mouth 3 (three) times daily as needed for cough. 11/21/12  Yes Eleanore E Egan, PA-C  CASCARA SAGRADA PO Take 3 capsules by mouth every other day.    Yes Historical Provider, MD  doxycycline (VIBRAMYCIN) 100 MG capsule Take 1 capsule (100 mg total) by mouth 2 (two) times daily. 12/04/12  Yes Ethelda Chick, MD  fish oil-omega-3 fatty acids 1000 MG capsule Take 1 g by mouth daily.   Yes Historical Provider, MD  vitamin B-12 (CYANOCOBALAMIN) 1000 MCG tablet Take 1,000 mcg by mouth daily.    Yes Historical Provider, MD  albuterol (PROVENTIL HFA;VENTOLIN HFA) 108 (90 BASE) MCG/ACT inhaler Inhale 2 puffs into the  lungs every 4 (four) hours as needed for wheezing (cough, shortness of breath or wheezing.). 11/21/12   Godfrey Pick, PA-C  aspirin 81 MG tablet Take 81 mg by mouth at bedtime.     Historical Provider, MD  atorvastatin (LIPITOR) 20 MG tablet Take 1 tablet (20 mg total) by mouth at bedtime. 09/01/12   Wanda Plump, MD  fluticasone [facility]) 50 MCG/ACT nasal spray Place 2 sprays into the nose daily. 11/21/12   Eleanore Delia Chimes, PA-C    Allergies: No Known Allergies  ROS: Constitutional: Afebrile, no chills Cardiovascular: negative for chest pain or palpitations Dermatological: Positive for wound. Negative for erythema, pain, or warmth.  GI: No nausea or vomiting   EXAM: Physical Exam: Blood pressure 114/68, pulse 64, temperature 98 F (36.7 C), temperature source Oral, resp. rate 16, height 5' 4.5" (1.638 m), weight 202 lb (91.627 kg), SpO2 96.00%., Body mass index is 34.15 kg/(m^2). General: Well developed, well nourished, in no acute distress. Nontoxic appearing. Head: Normocephalic, atraumatic, sclera non-icteric.  Neck: Supple. Lungs: Breathing is unlabored. Heart: Normal rate. Skin:  Warm and moist. Dressing and packing in place. No induration, erythema, or tenderness to palpation. Neuro: Alert and oriented X 3. Moves all extremities spontaneously. Normal gait.  Psych:  Responds to questions appropriately with a normal affect.       PROCEDURE: Dressing and packing removed.  Small amount of purulence expressed Wound bed healthy Irrigated with 1% plain lidocaine 5 cc. Repacked with 1/4 plain packing.  Dressing applied  LAB: Culture: pending  A/P: 62 y.o. y/o female with abdominal wall cellulitis/abscess as above s/p I&D on 12/04/12.  Wound care per above Continue doxycycline.  Pain well controlled Daily dressing changes Recheck 48 hours  Signed, Rhoderick Moody, PA-C 12/06/2012 2:26 PM

## 2012-12-08 ENCOUNTER — Ambulatory Visit (INDEPENDENT_AMBULATORY_CARE_PROVIDER_SITE_OTHER): Payer: BC Managed Care – PPO | Admitting: Physician Assistant

## 2012-12-08 VITALS — BP 122/84 | HR 70 | Temp 98.3°F | Resp 18 | Ht 65.0 in | Wt 202.0 lb

## 2012-12-08 DIAGNOSIS — L02219 Cutaneous abscess of trunk, unspecified: Secondary | ICD-10-CM

## 2012-12-08 LAB — WOUND CULTURE
Gram Stain: NONE SEEN
Gram Stain: NONE SEEN

## 2012-12-08 NOTE — Progress Notes (Signed)
  Subjective:    Patient ID: Veronica [name], female    DOB: [birth year], 62 y.o.   MRN: [id number]  HPI   Ms. Veronica [name] is a pleasant 62 yr old female here for follow up after I&D of abscess on 12/04/12.  Previous notes reviewed.  She reports that she is doing well.  The area is no longer tender.  She is taking the antibiotics and tolerating them well.  No NV, FC.     Review of Systems  Respiratory: Negative.   Cardiovascular: Negative.   Gastrointestinal: Negative.   Musculoskeletal: Negative.   Skin: Positive for wound.  Neurological: Negative.        Objective:   Physical Exam  Vitals reviewed. Constitutional: She is oriented to person, place, and time. She appears well-developed and well-nourished. No distress.  HENT:  Head: Normocephalic and atraumatic.  Eyes: Conjunctivae are normal. No scleral icterus.  Pulmonary/Chest: Effort normal.  Neurological: She is alert and oriented to person, place, and time.  Skin: Skin is warm and dry.     Healing wound at mid upper abd; no erythema, induration; nontender  Psychiatric: She has a normal mood and affect. Her behavior is normal.    Wound Care: Dressing removed.  Packing had fallen out.  Small opening at the lateral aspect of the wound.  Wound irrigated with 3cc 2% plain lidocaine.  Wound not repacked.  Dressing applied.      Assessment & Plan:  Cellulitis and abscess of trunk   Ms. Veronica [name] is a very pleasant 62 yr old female here for follow up on an abscess drained 12/04/12.  Wound appears to be healing very well.  Packing had already fallen out of the wound, so I have not repacked today.  Continue daily dressing changes until completely healed.  Follow up if needed.

## 2012-12-10 ENCOUNTER — Encounter: Payer: Self-pay | Admitting: Family Medicine

## 2013-01-21 ENCOUNTER — Ambulatory Visit: Payer: BC Managed Care – PPO

## 2013-01-21 ENCOUNTER — Ambulatory Visit (INDEPENDENT_AMBULATORY_CARE_PROVIDER_SITE_OTHER): Payer: BC Managed Care – PPO | Admitting: Family Medicine

## 2013-01-21 VITALS — BP 114/76 | HR 76 | Temp 98.6°F | Resp 16 | Ht 64.25 in | Wt 195.0 lb

## 2013-01-21 DIAGNOSIS — J069 Acute upper respiratory infection, unspecified: Secondary | ICD-10-CM

## 2013-01-21 DIAGNOSIS — J029 Acute pharyngitis, unspecified: Secondary | ICD-10-CM

## 2013-01-21 DIAGNOSIS — J019 Acute sinusitis, unspecified: Secondary | ICD-10-CM

## 2013-01-21 DIAGNOSIS — R059 Cough, unspecified: Secondary | ICD-10-CM

## 2013-01-21 DIAGNOSIS — R05 Cough: Secondary | ICD-10-CM

## 2013-01-21 DIAGNOSIS — R062 Wheezing: Secondary | ICD-10-CM

## 2013-01-21 DIAGNOSIS — J209 Acute bronchitis, unspecified: Secondary | ICD-10-CM

## 2013-01-21 LAB — POCT CBC
Granulocyte percent: 63 %G (ref 37–80)
HCT, POC: 41.1 % (ref 37.7–47.9)
Hemoglobin: 13 g/dL (ref 12.2–16.2)
Lymph, poc: 1.6 (ref 0.6–3.4)
MCH, POC: 29 pg (ref 27–31.2)
MCHC: 31.6 g/dL — AB (ref 31.8–35.4)
MCV: 91.7 fL (ref 80–97)
MID (cbc): 0.3 (ref 0–0.9)
MPV: 9.6 fL (ref 0–99.8)
POC Granulocyte: 3.1 (ref 2–6.9)
POC LYMPH PERCENT: 31.2 % (ref 10–50)
POC MID %: 5.8 % (ref 0–12)
Platelet Count, POC: 200 10*3/uL (ref 142–424)
RBC: 4.48 M/uL (ref 4.04–5.48)
RDW, POC: 14.7 %
WBC: 5 10*3/uL (ref 4.6–10.2)

## 2013-01-21 LAB — POCT RAPID STREP A (OFFICE): Rapid Strep A Screen: NEGATIVE

## 2013-01-21 MED ORDER — FLUTICASONE PROPIONATE 50 MCG/ACT NA SUSP: 2.0000 | Freq: Every day | NASAL | Status: DC

## 2013-01-21 MED ORDER — AMOXICILLIN-POT CLAVULANATE 875-125 MG PO TABS: 1.0000 | ORAL_TABLET | Freq: Two times a day (BID) | ORAL | Status: DC

## 2013-01-21 MED ORDER — BENZONATATE 100 MG PO CAPS: 100.0000 mg | ORAL_CAPSULE | Freq: Three times a day (TID) | ORAL | Status: DC | PRN

## 2013-01-21 NOTE — Progress Notes (Signed)
Chief Complaint:  Chief Complaint  Patient presents with  . Back Pain    x 5 days with numbness  . Chest Pain    x 5 days  . Fatigue    HPI: Veronica [name] is a 62 y.o. female who is here for SOB, fatigue, upper back pain, and body aches for 5 days, mostly associated with coughing. She is complaining of chest tightness after coughing. She is traveling to [place] in 5 days. Pt states that she also has had a cough with a greenish product. Associated with Sore throat and headache. No NVD. No ear pain. She took natural herbs, metamucil, vitamin c, and tylenol which helped her when she went to sleep. She works at an [employer], [employer]. Did not get flu vaccine. She feels better with allergy medicine She is wheezing. Does have an albuterol inhaler for when this happens with allergy symptoms. 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   Has a history allergies in the spring only, nonsmoker.   Past Medical History  Diagnosis Date  . Allergic rhinitis   . Anxiety and depression   . Hemorrhoids   . Constipation, chronic   . Uterine fibroid   . Low back pain     w/ left side radiculopathy, s/p injection at L4-L5 forarmen 1/06  . Coronary artery disease     06-2010, had CP----> Mild nonobstructive CAD on cardiac CT   . Hypertension   . GERD (gastroesophageal reflux disease)   . Leukoplakia of vagina   . Hip pain, bilateral   . Hyperlipidemia    Past Surgical History  Procedure Laterality Date  . Tubal ligation      bilateral 1989  . Abdominal hysterectomy  11-2000  . Leep      unknown date   . Cholecystectomy  12/08/2010    Procedure: LAPAROSCOPIC CHOLECYSTECTOMY WITH INTRAOPERATIVE CHOLANGIOGRAM;  Surgeon: [name], MD;  Location: [facility];  Service: General;  Laterality: N/A;  c-arm    History   Social History  . Marital Status: Divorced    Spouse Name: N/A    Number of Children: 3  . 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 Smoking status: Never Smoker   . Smokeless tobacco: Never Used  . Alcohol Use: Yes     Comment: rarely  . Drug Use: No  . Sexual Activity: No   Other Topics Concern  . None   Social History Narrative   Divorced, lives w/ 2 of her children   Original from [place], living in [place] since age 48                  Family History  Problem Relation Age of Onset  . Schizophrenia Son   . Schizophrenia Brother   . Schizophrenia Other     2 nieces  . Diabetes Neg Hx   . Heart attack Neg Hx   . Colon cancer Neg Hx   . Breast cancer Neg Hx    No Known Allergies Prior to Admission medications   Medication Sig Start Date End Date Taking? Authorizing Provider  ALPRAZolam (XANAX) 0.25 MG tablet Take 0.5-1 tablets (0.125-0.25 mg total) by mouth at bedtime as needed. anxiety 09/01/12  Yes Wanda Plump, MD  amLODipine (NORVASC) 5 MG tablet Take 1 tablet (5 mg total) by mouth every morning. 09/01/12  Yes Wanda Plump, MD  CASCARA SAGRADA PO Take 3 capsules by mouth every other day.    Yes Historical Provider, MD  fish oil-omega-3 fatty acids 1000 MG capsule Take 1 g by mouth daily.   Yes Historical Provider, MD  vitamin B-12 (CYANOCOBALAMIN) 1000 MCG tablet Take 1,000 mcg by mouth daily.    Yes Historical Provider, MD  albuterol (PROVENTIL HFA;VENTOLIN HFA) 108 (90 BASE) MCG/ACT inhaler Inhale 2 puffs into the lungs every 4 (four) hours as needed for wheezing (cough, shortness of breath or wheezing.). 11/21/12   Godfrey Pick, PA-C  aspirin 81 MG tablet Take 81 mg by mouth at bedtime.     Historical Provider, MD  atorvastatin (LIPITOR) 20 MG tablet Take 1 tablet (20 mg total) by mouth at bedtime. 09/01/12   Wanda Plump, MD  benzonatate (TESSALON) 100 MG capsule Take 1-2 capsules (100-200 mg total) by mouth 3 (three) times daily as needed for cough. 11/21/12   Eleanore Delia Chimes, PA-C  doxycycline (VIBRAMYCIN) 100 MG capsule Take 1 capsule (100 mg  total) by mouth 2 (two) times daily. 12/04/12   Ethelda Chick, MD  fluticasone (FLONASE) 50 MCG/ACT nasal spray Place 2 sprays into the nose daily. 11/21/12   Eleanore Delia Chimes, PA-C     ROS: The patient denies, night sweats, unintentional weight loss,palpitations,  nausea, vomiting, abdominal pain, dysuria, hematuria, melena, numbness, , or tingling.   All other systems have been reviewed and were otherwise negative with the exception of those mentioned in the HPI and as above.    PHYSICAL EXAM: Filed Vitals:   01/21/13 0936  BP: 114/76  Pulse: 76  Temp: 98.6 F (37 C)  Resp: 16   Filed Vitals:   01/21/13 0936  Height: 5' 4.25" (1.632 m)  Weight: 195 lb (88.451 kg)   Body mass index is 33.21 kg/(m^2).  General: Alert, no acute distress HEENT:  Normocephalic, atraumatic, oropharynx patent. EOMI, PERRLA. TM nl, + sinus tenderness, erythematous thraot Cardiovascular:  Regular rate and rhythm, no rubs murmurs or gallops.  No Carotid bruits, radial pulse intact. No pedal edema.  Respiratory: Clear to auscultation bilaterally.  No wheezes, rales, or rhonchi.  No cyanosis, no use of accessory musculature GI: No organomegaly, abdomen is soft and non-tender, positive bowel sounds.  No masses. Skin: No rashes. Neurologic: Facial musculature symmetric. Psychiatric: Patient is appropriate throughout our interaction. Lymphatic: No cervical lymphadenopathy Musculoskeletal: Gait intact.   LABS: Results for orders placed in visit on 01/21/13  POCT CBC      Result Value Range   WBC 5.0  4.6 - 10.2 K/uL   Lymph, poc 1.6  0.6 - 3.4   POC LYMPH PERCENT 31.2  10 - 50 %L   MID (cbc) 0.3  0 - 0.9   POC MID % 5.8  0 - 12 %M   POC Granulocyte 3.1  2 - 6.9   Granulocyte percent 63.0  37 - 80 %G   RBC 4.48  4.04 - 5.48 M/uL   Hemoglobin 13.0  12.2 - 16.2 g/dL   HCT, POC 16.1  09.6 - 47.9 %   MCV 91.7  80 - 97 fL   MCH, POC 29.0  27 - 31.2 pg   MCHC 31.6 (*) 31.8 - 35.4 g/dL   RDW, POC 04.5      Platelet Count, POC 200  142 - 424 K/uL   MPV 9.6  0 - 99.8 fL  POCT RAPID STREP A (OFFICE)  Result Value Range   Rapid Strep A Screen Negative  Negative     EKG/XRAY:   Primary read interpreted by Dr. Conley Rolls at [facility]. + bronchitic changes , no pneumothorax, no effusion, no acute cardiopulmonary process   ASSESSMENT/PLAN: Encounter Diagnoses  Name Primary?  . Cough Yes  . Wheezing   . Acute pharyngitis   . Viral URI with cough   . Acute bronchitis   . Sinusitis, acute    Rx flonase, rx tessalon and augmentin No risk factors for PE Use albuterol prn F/u prn  Gross sideeffects, risk and benefits, and alternatives of medications d/w patient. Patient is aware that all medications have potential sideeffects and we are unable to predict every sideeffect or drug-drug interaction that may occur.  LE, THAO PHUONG, DO 01/21/2013 11:15 AM

## 2013-01-21 NOTE — Patient Instructions (Signed)
Sinusitis Sinusitis is redness, soreness, and swelling (inflammation) of the paranasal sinuses. Paranasal sinuses are air pockets within the bones of your face (beneath the eyes, the middle of the forehead, or above the eyes). In healthy paranasal sinuses, mucus is able to drain out, and air is able to circulate through them by way of your nose. However, when your paranasal sinuses are inflamed, mucus and air can become trapped. This can allow bacteria and other germs to grow and cause infection. Sinusitis can develop quickly and last only a short time (acute) or continue over a long period (chronic). Sinusitis that lasts for more than 12 weeks is considered chronic.  CAUSES  Causes of sinusitis include:  Allergies.  Structural abnormalities, such as displacement of the cartilage that separates your nostrils (deviated septum), which can decrease the air flow through your nose and sinuses and affect sinus drainage.  Functional abnormalities, such as when the small hairs (cilia) that line your sinuses and help remove mucus do not work properly or are not present. SYMPTOMS  Symptoms of acute and chronic sinusitis are the same. The primary symptoms are pain and pressure around the affected sinuses. Other symptoms include:  Upper toothache.  Earache.  Headache.  Bad breath.  Decreased sense of smell and taste.  A cough, which worsens when you are lying flat.  Fatigue.  Fever.  Thick drainage from your nose, which often is green and may contain pus (purulent).  Swelling and warmth over the affected sinuses. DIAGNOSIS  Your caregiver will perform a physical exam. During the exam, your caregiver may:  Look in your nose for signs of abnormal growths in your nostrils (nasal polyps).  Tap over the affected sinus to check for signs of infection.  View the inside of your sinuses (endoscopy) with a special imaging device with a light attached (endoscope), which is inserted into your  sinuses. If your caregiver suspects that you have chronic sinusitis, one or more of the following tests may be recommended:  Allergy tests.  Nasal culture A sample of mucus is taken from your nose and sent to a lab and screened for bacteria.  Nasal cytology A sample of mucus is taken from your nose and examined by your caregiver to determine if your sinusitis is related to an allergy. TREATMENT  Most cases of acute sinusitis are related to a viral infection and will resolve on their own within 10 days. Sometimes medicines are prescribed to help relieve symptoms (pain medicine, decongestants, nasal steroid sprays, or saline sprays).  However, for sinusitis related to a bacterial infection, your caregiver will prescribe antibiotic medicines. These are medicines that will help kill the bacteria causing the infection.  Rarely, sinusitis is caused by a fungal infection. In theses cases, your caregiver will prescribe antifungal medicine. For some cases of chronic sinusitis, surgery is needed. Generally, these are cases in which sinusitis recurs more than 3 times per year, despite other treatments. HOME CARE INSTRUCTIONS   Drink plenty of water. Water helps thin the mucus so your sinuses can drain more easily.  Use a humidifier.  Inhale steam 3 to 4 times a day (for example, sit in the bathroom with the shower running).  Apply a warm, moist washcloth to your face 3 to 4 times a day, or as directed by your caregiver.  Use saline nasal sprays to help moisten and clean your sinuses.  Take over-the-counter or prescription medicines for pain, discomfort, or fever only as directed by your caregiver. SEEK IMMEDIATE MEDICAL   MEDICAL CARE IF:  You have increasing pain or severe headaches.  You have nausea, vomiting, or drowsiness.  You have swelling around your face.  You have vision problems.  You have a stiff neck.  You have difficulty breathing. MAKE SURE YOU:   Understand these  instructions.  Will watch your condition.  Will get help right away if you are not doing well or get worse. Document Released: 01/15/2005 Document Revised: 04/09/2011 Document Reviewed: 01/30/2011 ExitCare Patient Information 2014 [employer]. Acute Bronchitis Bronchitis is inflammation of the airways that extend from the windpipe into the lungs (bronchi). The inflammation often causes mucus to develop. This leads to a cough, which is the most common symptom of bronchitis.  In acute bronchitis, the condition usually develops suddenly and goes away over time, usually in a couple weeks. Smoking, allergies, and asthma can make bronchitis worse. Repeated episodes of bronchitis may cause further lung problems.  CAUSES Acute bronchitis is most often caused by the same virus that causes a cold. The virus can spread from person to person (contagious).  SIGNS AND SYMPTOMS   Cough.   Fever.   Coughing up mucus.   Body aches.   Chest congestion.   Chills.   Shortness of breath.   Sore throat.  DIAGNOSIS  Acute bronchitis is usually diagnosed through a physical exam. Tests, such as chest X-rays, are sometimes done to rule out other conditions.  TREATMENT  Acute bronchitis usually goes away in a couple weeks. Often times, no medical treatment is necessary. Medicines are sometimes given for relief of fever or cough. Antibiotics are usually not needed but may be prescribed in certain situations. In some cases, an inhaler may be recommended to help reduce shortness of breath and control the cough. A cool mist vaporizer may also be used to help thin bronchial secretions and make it easier to clear the chest.  HOME CARE INSTRUCTIONS  Get plenty of rest.   Drink enough fluids to keep your urine clear or pale yellow (unless you have a medical condition that requires fluid restriction). Increasing fluids may help thin your secretions and will prevent dehydration.   Only take  over-the-counter or prescription medicines as directed by your health care provider.   Avoid smoking and secondhand smoke. Exposure to cigarette smoke or irritating chemicals will make bronchitis worse. If you are a smoker, consider using nicotine gum or skin patches to help control withdrawal symptoms. Quitting smoking will help your lungs heal faster.   Reduce the chances of another bout of acute bronchitis by washing your hands frequently, avoiding people with cold symptoms, and trying not to touch your hands to your mouth, nose, or eyes.   Follow up with your health care provider as directed.  SEEK MEDICAL CARE IF: Your symptoms do not improve after 1 week of treatment.  SEEK IMMEDIATE MEDICAL CARE IF:  You develop an increased fever or chills.   You have chest pain.   You have severe shortness of breath.  You have bloody sputum.   You develop dehydration.  You develop fainting.  You develop repeated vomiting.  You develop a severe headache. MAKE SURE YOU:   Understand these instructions.  Will watch your condition.  Will get help right away if you are not doing well or get worse. Document Released: 02/23/2004 Document Revised: 09/17/2012 Document Reviewed: 07/08/2012 ExitCare Patient Information 2014 [employer].  

## 2013-07-13 ENCOUNTER — Encounter: Payer: Self-pay | Admitting: Internal Medicine

## 2013-07-13 ENCOUNTER — Ambulatory Visit (INDEPENDENT_AMBULATORY_CARE_PROVIDER_SITE_OTHER): Payer: BC Managed Care – PPO | Admitting: Internal Medicine

## 2013-07-13 VITALS — BP 117/71 | HR 71 | Temp 98.0°F | Wt 188.0 lb

## 2013-07-13 DIAGNOSIS — E785 Hyperlipidemia, unspecified: Secondary | ICD-10-CM

## 2013-07-13 DIAGNOSIS — M25559 Pain in unspecified hip: Secondary | ICD-10-CM

## 2013-07-13 NOTE — Assessment & Plan Note (Signed)
Long history of pain at the lower back bilaterally, and this time the pain is concentrated on the left buttock with occasional radiation to the left leg. Patient request a referral, will refer to orthopedic surgery. Continue with Motrin. Encouraged to do self   physical therapy, see instructions

## 2013-07-13 NOTE — Assessment & Plan Note (Addendum)
self discontinue Lipitor because her cholesterol was good, I think that she will need  Lipitor long-term, we agreed that she will come back in 2 months, fasting and will reassess the situation w/ a  FLP. She is willing to restart statins

## 2013-07-13 NOTE — Progress Notes (Signed)
Subjective:    Patient ID: Veronica [name], female    DOB: Feb 09, 1950, 63 y.o.   MRN: [id number]  DOS:  07/13/2013 Type of  Visit: Acute History: Long history of back pain with occasional radiation to the left leg, lately she retired, has more time, has been trying to exercise on the pain is more persistent with more frequent radiation to the left leg. Some help with OTC Motrin Also, self discontinue Lipitor because her cholesterol was very good.   ROS Denies bladder or bowel incontinence Denies any tingling or numbness of the lower extremities.  Past Medical History  Diagnosis Date  . Allergic rhinitis   . Anxiety and depression   . Hemorrhoids   . Constipation, chronic   . Uterine fibroid   . Low back pain     w/ left side radiculopathy, s/p injection at L4-L5 forarmen 1/06  . Coronary artery disease     06-2010, had CP----> Mild nonobstructive CAD on cardiac CT   . Hypertension   . GERD (gastroesophageal reflux disease)   . Leukoplakia of vagina   . Hip pain, bilateral   . Hyperlipidemia     Past Surgical History  Procedure Laterality Date  . Tubal ligation      bilateral 1989  . Abdominal hysterectomy  11-2000  . Leep      unknown date   . Cholecystectomy  12/08/2010    Procedure: LAPAROSCOPIC CHOLECYSTECTOMY WITH INTRAOPERATIVE CHOLANGIOGRAM;  Surgeon: Pedro Earls, MD;  Location: [facility];  Service: General;  Laterality: N/A;  c-arm     History   Social History  . Marital Status: Divorced    Spouse Name: N/A    Number of Children: 3  . Years of Education: N/A   Occupational History  . 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     Social History Main Topics  . Smoking status: Never Smoker   . Smokeless tobacco: Never Used  . Alcohol Use: Yes     Comment: rarely  . Drug Use: No  . Sexual Activity: No   Other Topics Concern  . Not on file   Social History Narrative   Divorced, lives w/ 2 of her children   Original from [place], living in [place]  since age 64                       Medication List       This list is accurate as of: 07/13/13  1:10 PM.  Always use your most recent med list.               ALPRAZolam 0.25 MG tablet  Commonly known as:  XANAX  Take 0.5-1 tablets (0.125-0.25 mg total) by mouth at bedtime as needed. anxiety     amLODipine 5 MG tablet  Commonly known as:  NORVASC  Take 1 tablet (5 mg total) by mouth every morning.     CASCARA SAGRADA PO  Take 3 capsules by mouth every other day.     fish oil-omega-3 fatty acids 1000 MG capsule  Take 1 g by mouth daily.     fluticasone 50 MCG/ACT nasal spray  Commonly known as:  FLONASE  Place 2 sprays into both nostrils daily.     vitamin B-12 1000 MCG tablet  Commonly known as:  CYANOCOBALAMIN  Take 1,000 mcg by mouth daily.           Objective:   Physical Exam BP 117/71  Pulse 71  Temp(Src) 98 F (36.7 C)  Wt 188 lb (85.276 kg)  SpO2 94% General -- alert, well-developed, NAD.   Extremities-- no pretibial edema bilaterally; hips ROW wnl, slt painful @ the L? No TTP at trochanteric bursa  Neurologic--  alert & oriented X3. Speech normal, gait appropriate for age, strength symmetric and appropriate for age.  DTRs symmetric (decreased ankle jerk B). No TTP at the back  Psych-- Cognition and judgment appear intact. Cooperative with normal attention span and concentration. No anxious or depressed appearing.        Assessment & Plan:

## 2013-07-13 NOTE — Progress Notes (Signed)
Pre visit review using our clinic review tool, if applicable. No additional management support is needed unless otherwise documented below in the visit note. 

## 2013-07-13 NOTE — Patient Instructions (Signed)
Motrin 200 mg 2 tablets every 6 hours as needed for pain. Always take it with food because may cause gastritis and ulcers. If you notice nausea, stomach pain, change in the color of stools --->  Stop the medicine and let us know   [employer] with information about home physical therapy for back pain: [email or web address]  Next visit is for a physical exam by 08-2013, fasting Please make an appointment

## 2013-07-21 ENCOUNTER — Ambulatory Visit (INDEPENDENT_AMBULATORY_CARE_PROVIDER_SITE_OTHER): Payer: BC Managed Care – PPO | Admitting: Physician Assistant

## 2013-07-21 VITALS — BP 126/74 | HR 71 | Temp 98.4°F | Resp 18 | Ht 65.0 in | Wt 185.0 lb

## 2013-07-21 DIAGNOSIS — L723 Sebaceous cyst: Secondary | ICD-10-CM

## 2013-07-21 DIAGNOSIS — E785 Hyperlipidemia, unspecified: Secondary | ICD-10-CM

## 2013-07-21 LAB — LIPID PANEL
Cholesterol: 173 mg/dL (ref 0–200)
HDL: 46 mg/dL (ref >39)
LDL Cholesterol: 102 mg/dL — ABNORMAL HIGH (ref 0–99)
Total CHOL/HDL Ratio: 3.8 Ratio
Triglycerides: 124 mg/dL (ref <150)
VLDL: 25 mg/dL (ref 0–40)

## 2013-07-21 NOTE — Progress Notes (Signed)
   Subjective:    Patient ID: Veronica [name], female    DOB: 1950-10-14, 63 y.o.   MRN: [id number]  HPI 63 year old female presents for evaluation of a cyst on her back. States it has been there for "years" and was previously incised about 1 year ago. Admits it has done ok since then until 2 months ago when it started getting bigger and becoming painful.  Denies any erythema, warmth, or drainage.   Also requesting cholesterol check today. Hx of hyperlipidemia and HTN. She stopped her medications 3 months ago. Would like to have lipids done to determine if she still needs medication. BP has been controlled.    Review of Systems  Constitutional: Negative for chills.  Gastrointestinal: Negative for nausea and vomiting.       Objective:   Physical Exam  Constitutional: She is oriented to person, place, and time. She appears well-developed and well-nourished.  HENT:  Head: Normocephalic and atraumatic.  Eyes: Conjunctivae are normal.  Neck: Normal range of motion.  Cardiovascular: Normal rate.   Pulmonary/Chest: Effort normal.  Neurological: She is alert and oriented to person, place, and time.  Skin:     Psychiatric: She has a normal mood and affect. Her behavior is normal. Judgment and thought content normal.     Procedure: VCO. Local anesthesia with 2% lidocaine with epinephrine. Betadine and SP.  2 cm elliptical incision made along lesion.  Moderate amount of sebaceous material expressed and cyst capsule removed. Repaired with #3 SI sutures. Cleaned and bandage applied.  Wound care discussed.      Assessment & Plan:  Other and unspecified hyperlipidemia - Plan: Lipid panel  Sebaceous cyst  Lipids pending. BP controlled. F/u with Dr. Larose Kells based on results.  Sebaceous cyst removed. RTC precautions discussed.  Return in 7-10 days for suture removal.

## 2013-07-26 ENCOUNTER — Encounter: Payer: Self-pay | Admitting: Physician Assistant

## 2013-07-26 ENCOUNTER — Ambulatory Visit (INDEPENDENT_AMBULATORY_CARE_PROVIDER_SITE_OTHER): Payer: BC Managed Care – PPO | Admitting: Physician Assistant

## 2013-07-26 VITALS — BP 146/82 | HR 77 | Temp 98.3°F | Resp 16

## 2013-07-26 DIAGNOSIS — Z4802 Encounter for removal of sutures: Secondary | ICD-10-CM

## 2013-07-26 NOTE — Progress Notes (Signed)
   Subjective:    Patient ID: Veronica [name], female    DOB: January 20, 1951, 63 y.o.   MRN: [id number]  Suture / Staple Removal     Veronica [name] is a very pleasant 63 yr old female here for suture removal.  Sutures placed here 07/21/13 after sebaceous cyst removal.  Pt is going out of town so came in early to see if sutures could be removed.  She reports that the area is healing well.  No pain or drainage.      Review of Systems  Constitutional: Negative.   Musculoskeletal: Negative.   Skin: Positive for wound.       Objective:   Physical Exam  Vitals reviewed. Constitutional: She is oriented to person, place, and time. She appears well-developed and well-nourished. No distress.  HENT:  Head: Normocephalic and atraumatic.  Pulmonary/Chest: Effort normal.  Neurological: She is alert and oriented to person, place, and time.  Skin: Skin is warm and dry.  Healing incision at upper back; suture removed without difficulty  Psychiatric: She has a normal mood and affect. Her behavior is normal.       Assessment & Plan:  Visit for suture removal   Veronica [name] is a very pleasant 63 yr old female here for suture removal.  Incision is healing very nicely.  Sutures removed.  Encouraged pt to use sun protection over incision while on vacation.  RTC if concerns  Pt to call or RTC if worsening or not improving  E. Natividad Brood MHS, PA-C [facility] 6/28/20155:21 PM

## 2013-08-31 ENCOUNTER — Other Ambulatory Visit: Payer: Self-pay | Admitting: Internal Medicine

## 2013-08-31 DIAGNOSIS — Z1231 Encounter for screening mammogram for malignant neoplasm of breast: Secondary | ICD-10-CM

## 2013-09-11 ENCOUNTER — Telehealth: Payer: Self-pay

## 2013-09-11 NOTE — Telephone Encounter (Signed)
Previous message made in error.  °

## 2013-09-11 NOTE — Telephone Encounter (Signed)
Is this the correct patient for this request?  Are these Rx's new requests?  This patient is not on any of these medications, so we are not sure.

## 2013-09-11 NOTE — Telephone Encounter (Signed)
Caller name:Taryne Relation to pt: Call back number:[phone] Pharmacy:[facility] on [address]  Reason for call: Kerriann called to say that she needs the Levemir and the Novalog to be in pens and she also needs needles for those called in. She also needs a prescription for hydrochlorolhiazide sent in.

## 2013-09-11 NOTE — Telephone Encounter (Signed)
Caller name:Yerania Relation to pt: Call back [phone] cell or [phone] Pharmacy:  Reason for call: Edyth is needing a referral to Dr. Jerene Bears, she already has an MRI that was done last Wednesday ordered by Dr Nelva Bush

## 2013-09-14 NOTE — Telephone Encounter (Signed)
Would you like to refer patient? I do not see an MRI in her chart.  Please advise.

## 2013-09-17 ENCOUNTER — Other Ambulatory Visit: Payer: Self-pay | Admitting: Internal Medicine

## 2013-09-17 NOTE — Telephone Encounter (Signed)
Pt is requesting refill for Xanax.   Last OV: 07/13/2013 Last Fill: 09/01/2012 # 30 with 5 RF Last UDS: none  Please Advise.

## 2013-09-18 ENCOUNTER — Ambulatory Visit (HOSPITAL_COMMUNITY)
Admission: RE | Admit: 2013-09-18 | Discharge: 2013-09-18 | Disposition: A | Discharge status: Home / Self Care | Payer: BC Managed Care – PPO | Source: Ambulatory Visit | Attending: Internal Medicine | Admitting: Internal Medicine

## 2013-09-18 DIAGNOSIS — Z1231 Encounter for screening mammogram for malignant neoplasm of breast: Secondary | ICD-10-CM | POA: Diagnosis present

## 2013-09-22 ENCOUNTER — Other Ambulatory Visit: Payer: Self-pay | Admitting: Internal Medicine

## 2013-09-22 DIAGNOSIS — R928 Other abnormal and inconclusive findings on diagnostic imaging of breast: Secondary | ICD-10-CM

## 2013-09-24 ENCOUNTER — Telehealth: Payer: Self-pay

## 2013-09-24 MED ORDER — AMLODIPINE BESYLATE 5 MG PO TABS: 5.0000 mg | ORAL_TABLET | Freq: Every morning | ORAL | Status: DC

## 2013-09-24 NOTE — Telephone Encounter (Signed)
Veronica [name] [phone] [facility]  [place] called and she needs ALPRAZolam (XANAX) 0.25 MG tablet, amLODipine (NORVASC) 5 MG tablet, and atorvastatin (LIPITOR) 20 MG tablet. Her CPE is scheduled for 10-26-13

## 2013-09-24 NOTE — Telephone Encounter (Signed)
Pt is requesting Alprazolam refill.  Last OV: 07/13/13 Last fill: 09/01/2012 # 30 with 5 RF Last UDS: none  Pt is also requesting Atorvastatin 20 mg refill but I see where it has been discontinued.  Please Advise.    Amlodipine has been sent to pharmacy.

## 2013-09-24 NOTE — Telephone Encounter (Signed)
Currently not taking Lipitor, last cholesterol panel was good. Recommend to schedule a physical fasting, we'll recheck her cholesterol then

## 2013-09-27 NOTE — Telephone Encounter (Signed)
Will rf xanax

## 2013-09-28 ENCOUNTER — Ambulatory Visit
Admission: RE | Admit: 2013-09-28 | Discharge: 2013-09-28 | Disposition: A | Discharge status: Home / Self Care | Payer: BC Managed Care – PPO | Source: Ambulatory Visit | Attending: Internal Medicine | Admitting: Internal Medicine

## 2013-09-28 DIAGNOSIS — R928 Other abnormal and inconclusive findings on diagnostic imaging of breast: Secondary | ICD-10-CM

## 2013-09-28 MED ORDER — ALPRAZOLAM 0.25 MG PO TABS: 0.1250 mg | ORAL_TABLET | Freq: Every evening | ORAL | Status: DC | PRN

## 2013-09-28 NOTE — Telephone Encounter (Signed)
Medication faxed to pharmacy 

## 2013-10-03 ENCOUNTER — Other Ambulatory Visit: Payer: Self-pay | Admitting: Internal Medicine

## 2013-10-19 ENCOUNTER — Other Ambulatory Visit: Payer: Self-pay | Admitting: Internal Medicine

## 2013-10-26 ENCOUNTER — Ambulatory Visit (INDEPENDENT_AMBULATORY_CARE_PROVIDER_SITE_OTHER): Payer: BC Managed Care – PPO | Admitting: Internal Medicine

## 2013-10-26 ENCOUNTER — Encounter: Payer: Self-pay | Admitting: Internal Medicine

## 2013-10-26 VITALS — BP 128/66 | HR 62 | Temp 97.6°F | Ht 64.0 in | Wt 186.0 lb

## 2013-10-26 DIAGNOSIS — F411 Generalized anxiety disorder: Secondary | ICD-10-CM

## 2013-10-26 DIAGNOSIS — Z0000 Encounter for general adult medical examination without abnormal findings: Secondary | ICD-10-CM

## 2013-10-26 MED ORDER — AMLODIPINE BESYLATE 5 MG PO TABS: 5.0000 mg | ORAL_TABLET | Freq: Every morning | ORAL | Status: DC

## 2013-10-26 MED ORDER — ALPRAZOLAM 0.25 MG PO TABS: 0.1250 mg | ORAL_TABLET | Freq: Every evening | ORAL | Status: DC | PRN

## 2013-10-26 MED ORDER — ATORVASTATIN CALCIUM 20 MG PO TABS: ORAL_TABLET | ORAL | Status: DC

## 2013-10-26 MED ORDER — FLUTICASONE PROPIONATE 50 MCG/ACT NA SUSP: 2.0000 | Freq: Every day | NASAL | Status: DC

## 2013-10-26 NOTE — Assessment & Plan Note (Addendum)
Td-- declined again Flu shot declined again zostavax --  2014  Colonoscopy ,  Dr. Sammuel Cooper, October 26, 2003---> Left-sided diverticulosis Due for a colonoscopy --- refer to  Dr. Earle Gell   [phone]  mammogram (-)  hysterectomy 2002 for bleeding , Saw  gynecology ~ 2  year ago --- refer to gyn (h/o leukoplakia of vagina) Discussed diet and exercise DEXA 07-2011 normal Labs

## 2013-10-26 NOTE — Progress Notes (Signed)
Pre visit review using our clinic review tool, if applicable. No additional management support is needed unless otherwise documented below in the visit note. 

## 2013-10-26 NOTE — Assessment & Plan Note (Signed)
Well-controlled on Xanax, prescription provided, UDS

## 2013-10-26 NOTE — Patient Instructions (Signed)
Get your blood work before you leave  Also needs a UDS     Please come back to the office in 6 months  for a routine check up  No fasting    Stop by the front desk and schedule the visit

## 2013-10-26 NOTE — Progress Notes (Signed)
Subjective:    Patient ID: Veronica [name], female    DOB: 20-Sep-1950, 63 y.o.   MRN: [id number]  DOS:  10/26/2013 Type of visit - description : CPX Interval history: Doing well, needs RFs  ROS  No  CP, SOB No palpitations, no lower extremity edema Denies  nausea, vomiting diarrhea, blood in the stools (-) cough, sputum production (-) wheezing, chest congestion No dysuria, gross hematuria, difficulty urinating   No vaginal discharge-bleed, genital rash No anxiety, depression    Past Medical History  Diagnosis Date  . Allergic rhinitis   . Anxiety and depression   . Hemorrhoids   . Constipation, chronic   . Uterine fibroid   . Low back pain     w/ left side radiculopathy, s/p injection at L4-L5 forarmen 1/06  . Coronary artery disease     06-2010, had CP----> Mild nonobstructive CAD on cardiac CT   . Hypertension   . GERD (gastroesophageal reflux disease)   . Leukoplakia of vagina   . Hip pain, bilateral   . Hyperlipidemia     Past Surgical History  Procedure Laterality Date  . Tubal ligation      bilateral 1989  . Abdominal hysterectomy  11-2000    no oophorectomy per pt  . Leep      unknown date   . Cholecystectomy  12/08/2010    Procedure: LAPAROSCOPIC CHOLECYSTECTOMY WITH INTRAOPERATIVE CHOLANGIOGRAM;  Surgeon: Pedro Earls, MD;  Location: [facility];  Service: General;  Laterality: N/A;  c-arm     History   Social History  . Marital Status: Divorced    Spouse Name: N/A    Number of Children: 3  . Years of Education: N/A   Occupational History  . 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     Social History Main Topics  . Smoking status: Never Smoker   . Smokeless tobacco: Never Used  . Alcohol Use: Yes     Comment: rarely  . Drug Use: No  . Sexual Activity: No   Other Topics Concern  . Not on file   Social History Narrative   Divorced, lives w/ 1 of her children   Original from [place], living in [place] since age 12                       Family History  Problem Relation Age of Onset  . Schizophrenia Son   . Schizophrenia Brother   . Schizophrenia Other     2 nieces  . Diabetes Neg Hx   . Heart attack Neg Hx   . Colon cancer Neg Hx   . Breast cancer Neg Hx        Medication List       This list is accurate as of: 10/26/13  6:23 PM.  Always use your most recent med list.               ALPRAZolam 0.25 MG tablet  Commonly known as:  XANAX  Take 0.5-1 tablets (0.125-0.25 mg total) by mouth at bedtime as needed. anxiety     amLODipine 5 MG tablet  Commonly known as:  NORVASC  Take 1 tablet (5 mg total) by mouth every morning.     atorvastatin 20 MG tablet  Commonly known as:  LIPITOR  TAKE 1 TABLET BY MOUTH AT BEDTIME     CASCARA SAGRADA PO  Take 3 capsules by mouth every other day.     fish oil-omega-3  fatty acids 1000 MG capsule  Take 1 g by mouth daily.     fluticasone 50 MCG/ACT nasal spray  Commonly known as:  FLONASE  Place 2 sprays into both nostrils daily.     vitamin B-12 1000 MCG tablet  Commonly known as:  CYANOCOBALAMIN  Take 1,000 mcg by mouth daily.           Objective:   Physical Exam BP 128/66  Pulse 62  Temp(Src) 97.6 F (36.4 C) (Oral)  Ht 5\' 4"  (1.626 m)  Wt 186 lb (84.369 kg)  BMI 31.91 kg/m2  SpO2 97% General -- alert, well-developed, NAD.  Neck --no thyromegaly  HEENT-- Not pale.  Lungs -- normal respiratory effort, no intercostal retractions, no accessory muscle use, and normal breath sounds.  Heart-- normal rate, regular rhythm, no murmur.  Abdomen-- Not distended, good bowel sounds,soft, non-tender.  Extremities-- no pretibial edema bilaterally  Neurologic--  alert & oriented X3. Speech normal, gait appropriate for age, strength symmetric and appropriate for age.  Psych-- Cognition and judgment appear intact. Cooperative with normal attention span and concentration. No anxious or depressed appearing.       Assessment & Plan:   Chronic medical problems,  symptoms well-controlled, will refill all medications including Xanax

## 2013-10-26 NOTE — Assessment & Plan Note (Signed)
Symptoms   improve after a Epidural injection

## 2013-10-27 LAB — CBC WITH DIFFERENTIAL/PLATELET
BASOS ABS: 0 10*3/uL (ref 0.0–0.1)
Basophils Relative: 0.4 % (ref 0.0–3.0)
EOS ABS: 0 10*3/uL (ref 0.0–0.7)
Eosinophils Relative: 0.8 % (ref 0.0–5.0)
HEMATOCRIT: 37.7 % (ref 36.0–46.0)
Hemoglobin: 12.8 g/dL (ref 12.0–15.0)
LYMPHS ABS: 1.9 10*3/uL (ref 0.7–4.0)
Lymphocytes Relative: 33.7 % (ref 12.0–46.0)
MCHC: 34 g/dL (ref 30.0–36.0)
MCV: 90.3 fl (ref 78.0–100.0)
MONO ABS: 0.3 10*3/uL (ref 0.1–1.0)
MONOS PCT: 5.1 % (ref 3.0–12.0)
NEUTROS ABS: 3.4 10*3/uL (ref 1.4–7.7)
Neutrophils Relative %: 60 % (ref 43.0–77.0)
PLATELETS: 210 10*3/uL (ref 150.0–400.0)
RBC: 4.17 Mil/uL (ref 3.87–5.11)
RDW: 13.9 % (ref 11.5–15.5)
WBC: 5.7 10*3/uL (ref 4.0–10.5)

## 2013-10-27 LAB — COMPREHENSIVE METABOLIC PANEL
ALBUMIN: 4.1 g/dL (ref 3.5–5.2)
ALK PHOS: 51 U/L (ref 39–117)
ALT: 15 U/L (ref 0–35)
AST: 20 U/L (ref 0–37)
BUN: 11 mg/dL (ref 6–23)
CO2: 27 meq/L (ref 19–32)
Calcium: 9 mg/dL (ref 8.4–10.5)
Chloride: 105 mEq/L (ref 96–112)
Creatinine, Ser: 0.6 mg/dL (ref 0.4–1.2)
GFR: 105.74 mL/min (ref >60.00)
GLUCOSE: 88 mg/dL (ref 70–99)
POTASSIUM: 3.7 meq/L (ref 3.5–5.1)
SODIUM: 136 meq/L (ref 135–145)
TOTAL PROTEIN: 6.9 g/dL (ref 6.0–8.3)
Total Bilirubin: 0.6 mg/dL (ref 0.2–1.2)

## 2013-10-27 LAB — VITAMIN D 25 HYDROXY (VIT D DEFICIENCY, FRACTURES): VITD: 35.52 ng/mL (ref 30.00–100.00)

## 2013-10-27 LAB — TSH: TSH: 0.38 u[IU]/mL (ref 0.35–4.50)

## 2013-10-30 ENCOUNTER — Telehealth: Payer: Self-pay

## 2013-10-30 NOTE — Telephone Encounter (Signed)
UDS: 10/26/2013  Negative for Alprazolam: PRN  Low risk per Dr. Larose Kells 10/30/2013

## 2013-11-05 ENCOUNTER — Encounter: Payer: Self-pay | Admitting: Internal Medicine

## 2013-12-09 ENCOUNTER — Other Ambulatory Visit: Payer: Self-pay | Admitting: Gastroenterology

## 2014-01-11 ENCOUNTER — Encounter (HOSPITAL_COMMUNITY): Payer: Self-pay | Admitting: *Deleted

## 2014-01-25 ENCOUNTER — Other Ambulatory Visit: Payer: Self-pay | Admitting: Gastroenterology

## 2014-02-02 ENCOUNTER — Encounter (HOSPITAL_COMMUNITY)
Admission: RE | Disposition: A | Discharge status: Home / Self Care | Payer: Self-pay | Source: Ambulatory Visit | Attending: Gastroenterology

## 2014-02-02 ENCOUNTER — Ambulatory Visit (HOSPITAL_COMMUNITY): Payer: BC Managed Care – PPO | Admitting: Anesthesiology

## 2014-02-02 ENCOUNTER — Ambulatory Visit (HOSPITAL_COMMUNITY)
Admission: RE | Admit: 2014-02-02 | Discharge: 2014-02-02 | Disposition: A | Discharge status: Home / Self Care | Payer: BC Managed Care – PPO | Source: Ambulatory Visit | Attending: Gastroenterology | Admitting: Gastroenterology

## 2014-02-02 ENCOUNTER — Encounter (HOSPITAL_COMMUNITY): Payer: Self-pay | Admitting: Anesthesiology

## 2014-02-02 DIAGNOSIS — G8929 Other chronic pain: Secondary | ICD-10-CM | POA: Diagnosis not present

## 2014-02-02 DIAGNOSIS — F329 Major depressive disorder, single episode, unspecified: Secondary | ICD-10-CM | POA: Diagnosis not present

## 2014-02-02 DIAGNOSIS — I10 Essential (primary) hypertension: Secondary | ICD-10-CM | POA: Insufficient documentation

## 2014-02-02 DIAGNOSIS — D124 Benign neoplasm of descending colon: Secondary | ICD-10-CM | POA: Diagnosis not present

## 2014-02-02 DIAGNOSIS — Z1211 Encounter for screening for malignant neoplasm of colon: Secondary | ICD-10-CM | POA: Insufficient documentation

## 2014-02-02 DIAGNOSIS — M545 Low back pain: Secondary | ICD-10-CM | POA: Diagnosis not present

## 2014-02-02 DIAGNOSIS — K5900 Constipation, unspecified: Secondary | ICD-10-CM | POA: Diagnosis not present

## 2014-02-02 DIAGNOSIS — K219 Gastro-esophageal reflux disease without esophagitis: Secondary | ICD-10-CM | POA: Diagnosis not present

## 2014-02-02 HISTORY — PX: COLONOSCOPY WITH PROPOFOL: SHX5780

## 2014-02-02 LAB — HM COLONOSCOPY: HM Colonoscopy: 1

## 2014-02-02 SURGERY — COLONOSCOPY WITH PROPOFOL
Anesthesia: Monitor Anesthesia Care

## 2014-02-02 MED ORDER — LACTATED RINGERS IV SOLN
INTRAVENOUS | Status: DC | PRN
  Administered 2014-02-02: 12:00:00 via INTRAVENOUS

## 2014-02-02 MED ORDER — LACTATED RINGERS IV SOLN
INTRAVENOUS | Status: DC
  Administered 2014-02-02: 1000 mL via INTRAVENOUS

## 2014-02-02 MED ORDER — SODIUM CHLORIDE 0.9 % IV SOLN
INTRAVENOUS | Status: DC
Start: 2014-02-02 — End: 2014-02-02

## 2014-02-02 MED ORDER — SODIUM CHLORIDE 0.9 % IV SOLN: INTRAVENOUS | Status: DC

## 2014-02-02 MED ORDER — PROPOFOL INFUSION 10 MG/ML OPTIME
INTRAVENOUS | Status: DC | PRN
  Administered 2014-02-02: 100 ug/kg/min via INTRAVENOUS

## 2014-02-02 MED ORDER — PROPOFOL 10 MG/ML IV BOLUS
INTRAVENOUS | Status: AC
  Filled 2014-02-02: qty 20

## 2014-02-02 MED ORDER — PROPOFOL 10 MG/ML IV EMUL
INTRAVENOUS | Status: DC | PRN
  Administered 2014-02-02 (×6): 20 mg via INTRAVENOUS
  Administered 2014-02-02: 40 mg via INTRAVENOUS

## 2014-02-02 SURGICAL SUPPLY — 21 items

## 2014-02-02 NOTE — H&P (Signed)
  Procedure: Screening colonoscopy. Normal screening colonoscopy performed on 10/26/2003. No family history of colon cancer.  History: The patient is a 64 year old female born 1950/06/21. She is scheduled to undergo a repeat screening colonoscopy with polypectomy to prevent colon cancer.  Medication allergies: None  Past medical history: Bilateral tubal ligation. Total abdominal hysterectomy. Hypertension. Chronic low back pain. History of uterine fibroids. Chronic constipation. Anxiety with depression. Allergic rhinitis.  Exam: The patient is alert and lying comfortably on the endoscopy stretcher. Abdomen is soft and nontender to palpation. Lungs are clear to auscultation. Cardiac exam reveals a regular rhythm.  Plan: Proceed with screening colonoscopy

## 2014-02-02 NOTE — Anesthesia Preprocedure Evaluation (Addendum)
Anesthesia Evaluation  Patient identified by MRN, date of birth, ID band Patient awake    Reviewed: Allergy & Precautions, NPO status , Patient's Chart, lab work & pertinent test results  Airway Mallampati: II  TM Distance: >3 FB Neck ROM: Full    Dental no notable dental hx.    Pulmonary neg pulmonary ROS,  breath sounds clear to auscultation  Pulmonary exam normal       Cardiovascular Exercise Tolerance: Good hypertension, Pt. on medications - CAD Rhythm:Regular Rate:Normal  She denies CAD. It is listed on her chart.  Good exercise tolerance  Mild non-obstructive CAD in 2012   Neuro/Psych PSYCHIATRIC DISORDERS Anxiety negative neurological ROS     GI/Hepatic Neg liver ROS, GERD-  ,  Endo/Other  negative endocrine ROS  Renal/GU negative Renal ROS  negative genitourinary   Musculoskeletal negative musculoskeletal ROS (+)   Abdominal   Peds negative pediatric ROS (+)  Hematology negative hematology ROS (+)   Anesthesia Other Findings   Reproductive/Obstetrics negative OB ROS                           Anesthesia Physical Anesthesia Plan  ASA: II  Anesthesia Plan: MAC   Post-op Pain Management:    Induction: Intravenous  Airway Management Planned:   Additional Equipment:   Intra-op Plan:   Post-operative Plan:   Informed Consent: I have reviewed the patients History and Physical, chart, labs and discussed the procedure including the risks, benefits and alternatives for the proposed anesthesia with the patient or authorized representative who has indicated his/her understanding and acceptance.   Dental advisory given  Plan Discussed with: CRNA  Anesthesia Plan Comments:         Anesthesia Quick Evaluation

## 2014-02-02 NOTE — Anesthesia Postprocedure Evaluation (Signed)
  Anesthesia Post-op Note  Patient: Veronica [name]  Procedure(s) Performed: Procedure(s) (LRB): COLONOSCOPY WITH PROPOFOL (N/A)  Patient Location: PACU  Anesthesia Type: MAC  Level of Consciousness: awake and alert   Airway and Oxygen Therapy: Patient Spontanous Breathing  Post-op Pain: mild  Post-op Assessment: Post-op Vital signs reviewed, Patient's Cardiovascular Status Stable, Respiratory Function Stable, Patent Airway and No signs of Nausea or vomiting  Last Vitals:  Filed Vitals:   02/02/14 1306  BP: 149/73  Pulse: 66  Temp:   Resp: 14    Post-op Vital Signs: stable   Complications: No apparent anesthesia complications

## 2014-02-02 NOTE — Transfer of Care (Signed)
\  Immediate Anesthesia Transfer of Care Note  Patient: Veronica [name]  Procedure(s) Performed: Procedure(s) (LRB): COLONOSCOPY WITH PROPOFOL (N/A)  Patient Location: PACU  Anesthesia Type: MAC  Level of Consciousness: sedated, patient cooperative and responds to stimulation  Airway & Oxygen Therapy: Patient Spontanous Breathing and Patient connected to face mask oxgen  Post-op Assessment: Report given to PACU RN and Post -op Vital signs reviewed and stable  Post vital signs: Reviewed and stable  Complications: No apparent anesthesia complications

## 2014-02-02 NOTE — Op Note (Signed)
Procedure: Screening colonoscopy. Normal screening colonoscopy performed on 10/26/2003  Endoscopist: Earle Gell  Premedication: Propofol administered by anesthesia  Procedure: The patient was placed in the left lateral decubitus position. Anal inspection and digital rectal exam were normal. The Pentax pediatric colonoscope was introduced into the rectum and advanced to the cecum. A normal-appearing appendiceal orifice was identified. A normal-appearing ileocecal valve was intubated and the terminal ileum inspected. Colonic preparation for the exam today was good. Withdrawal time was 15 minutes  Generalized melanosis coli was present  Rectum. Normal. Retroflexed view of the distal rectum normal  Sigmoid colon. Normal  Descending colon. From the mid descending colon a 5 mm sessile polyp was removed with the cold snare  Splenic flexure. Normal  Transverse colon. Normal  Hepatic flexure. Normal  Ascending colon. Normal  Cecum and ileocecal valve. Normal  Terminal ileum. Normal  Assessment: A 5 mm sessile polyp was removed from the mid descending colon; otherwise normal colonoscopy. Generalized melanosis coli due to chronic laxative use was present  Recommendation: If the descending colon polyp returns adenomatous pathologically, the patient should undergo a surveillance colonoscopy in 5 years. If the polyp returns nonneoplastic pathologically, she should undergo a repeat screening colonoscopy in 10 years

## 2014-02-03 ENCOUNTER — Encounter (HOSPITAL_COMMUNITY): Payer: Self-pay | Admitting: Gastroenterology

## 2014-03-01 ENCOUNTER — Telehealth: Payer: Self-pay | Admitting: Internal Medicine

## 2014-03-01 NOTE — Telephone Encounter (Signed)
Lab results from 10/26/2013 printed and mailed to Pt.

## 2014-03-01 NOTE — Telephone Encounter (Signed)
Spoke with Pt, informed her lab results released to [place] on 10/27/2013, Pt informed me she is not familiar with using MyChart, informed her lab results were normal and that I could mail her results to her. Pt verbalized understanding.

## 2014-03-01 NOTE — Telephone Encounter (Signed)
Caller name: Almee, Pelph[place] Relation to pt: self  Call back number: [phone] Pharmacy:  Reason for call:  Pt requesting CPE results. Please advise

## 2014-05-10 ENCOUNTER — Ambulatory Visit (INDEPENDENT_AMBULATORY_CARE_PROVIDER_SITE_OTHER): Payer: BC Managed Care – PPO | Admitting: Family

## 2014-05-10 ENCOUNTER — Encounter: Payer: Self-pay | Admitting: Family

## 2014-05-10 VITALS — BP 128/80 | HR 68 | Temp 97.9°F | Resp 16 | Ht 64.0 in | Wt 200.6 lb

## 2014-05-10 DIAGNOSIS — F411 Generalized anxiety disorder: Secondary | ICD-10-CM

## 2014-05-10 DIAGNOSIS — K219 Gastro-esophageal reflux disease without esophagitis: Secondary | ICD-10-CM

## 2014-05-10 DIAGNOSIS — I10 Essential (primary) hypertension: Secondary | ICD-10-CM

## 2014-05-10 DIAGNOSIS — Z01419 Encounter for gynecological examination (general) (routine) without abnormal findings: Secondary | ICD-10-CM | POA: Diagnosis not present

## 2014-05-10 DIAGNOSIS — N894 Leukoplakia of vagina: Secondary | ICD-10-CM | POA: Diagnosis not present

## 2014-05-10 NOTE — Patient Instructions (Signed)
Please follow up with Dr. Larose Kells in 6 months.

## 2014-05-10 NOTE — Assessment & Plan Note (Signed)
Pt denies gerd sxs, reports that these sxs were due to gallbladder- she is s/p cholecystectomy 2012.

## 2014-05-10 NOTE — Progress Notes (Signed)
Pre visit review using our clinic review tool, if applicable. No additional management support is needed unless otherwise documented below in the visit note. 

## 2014-05-10 NOTE — Assessment & Plan Note (Signed)
Had gyn referral back in 2010 for this finding and was told everything normal.  Normal exam today.

## 2014-05-10 NOTE — Assessment & Plan Note (Signed)
Stable except with travel, uses xanax prn.

## 2014-05-10 NOTE — Progress Notes (Signed)
Subjective:    Patient ID: Veronica [name], female    DOB: July 11, 1950, 64 y.o.   MRN: [id number]  HPI  Veronica [name] is a 64 yr old female who presents today for "pap"- she is s/p partial hysterectomy. Denies vaginal discharge/vaginal bleeding. Chart review notes dx of vaginal leukoplakia, however pt was referred to GYN (Dr. Jodi Mourning) back in 2010 and at that time reported that she was told everything was normal including her pap smear.   She is also due for follow up of her other medical issues:  HTN- Patient is currently maintained on the following medications for blood pressure: amlodipine Patient reports good compliance with blood pressure medications. Patient denies chest pain, shortness of breath or swelling. Last 3 blood pressure readings in our office are as follows:  BP Readings from Last 3 Encounters:  05/10/14 128/80  02/02/14 149/73  10/26/13 128/66   GERD- not on PPI- denies hx of reflux, reports that her symptoms were GB.  Anxiety-on xanax prn.  Reports on and off.  Reports that she uses xanax prn- once a week only- especially with travel.      Review of Systems See HPI  Past Medical History  Diagnosis Date  . Allergic rhinitis   . Anxiety and depression   . Hemorrhoids   . Constipation, chronic   . Uterine fibroid   . Low back pain     w/ left side radiculopathy, s/p injection at L4-L5 forarmen 1/06  . Coronary artery disease     06-2010, had CP----> Mild nonobstructive CAD on cardiac CT   . Hypertension   . Leukoplakia of vagina   . Hyperlipidemia   . Anxiety   . Hip pain, bilateral     History   Social History  . Marital Status: Divorced    Spouse Name: N/A  . Number of Children: 3  . Years of Education: N/A   Occupational History  . 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     Social History Main Topics  . Smoking status: Never Smoker   . Smokeless tobacco: Never Used  . Alcohol Use: Yes     Comment: rarely  . Drug Use: No  . Sexual  Activity: No   Other Topics Concern  . Not on file   Social History Narrative   Divorced, lives w/ 1 of her children   Original from [place], living in [place] since age 82                      Past Surgical History  Procedure Laterality Date  . Tubal ligation      bilateral 1989  . Abdominal hysterectomy  11-2000    no oophorectomy per pt  . Leep      unknown date   . Cholecystectomy  12/08/2010    Procedure: LAPAROSCOPIC CHOLECYSTECTOMY WITH INTRAOPERATIVE CHOLANGIOGRAM;  Surgeon: Pedro Earls, MD;  Location: [facility];  Service: General;  Laterality: N/A;  c-arm   . Colonoscopy with propofol N/A 02/02/2014    Procedure: COLONOSCOPY WITH PROPOFOL;  Surgeon: Garlan Fair, MD;  Location: [facility];  Service: Endoscopy;  Laterality: N/A;    Family History  Problem Relation Age of Onset  . Schizophrenia Son   . Schizophrenia Brother   . Schizophrenia Other     2 nieces  . Diabetes Neg Hx   . Heart attack Neg Hx   . Colon cancer Neg Hx   . Breast cancer Neg  Hx     No Known Allergies  Current Outpatient Prescriptions on File Prior to Visit  Medication Sig Dispense Refill  . ALPRAZolam (XANAX) 0.25 MG tablet Take 0.5-1 tablets (0.125-0.25 mg total) by mouth at bedtime as needed. anxiety 30 tablet 3  . amLODipine (NORVASC) 5 MG tablet Take 1 tablet (5 mg total) by mouth every morning. 90 tablet 2  . atorvastatin (LIPITOR) 20 MG tablet TAKE 1 TABLET BY MOUTH AT BEDTIME (Patient taking differently: TAKE 1 TABLET BY MOUTH AT BEDTIME.  (Pt only takes 1/2 tablet a day.)) 90 tablet 2  . CASCARA SAGRADA PO Take 3 capsules by mouth every other day.     . fluticasone (FLONASE) 50 MCG/ACT nasal spray Place 2 sprays into both nostrils daily. (Patient taking differently: Place 2 sprays into both nostrils daily as needed for allergies. ) 16 g 6  . vitamin B-12 (CYANOCOBALAMIN) 1000 MCG tablet Take 1,000 mcg by mouth daily.     . vitamin C (ASCORBIC ACID) 500 MG tablet Take 500 mg by  mouth daily.     No current facility-administered medications on file prior to visit.    BP 128/80 mmHg  Pulse 68  Temp(Src) 97.9 F (36.6 C) (Oral)  Resp 16  Ht 5\' 4"  (1.626 m)  Wt 200 lb 9.6 oz (90.992 kg)  BMI 34.42 kg/m2  SpO2 99%       Objective:   Physical Exam  Constitutional: She is oriented to person, place, and time. She appears well-developed and well-nourished.  Cardiovascular: Normal rate, regular rhythm and normal heart sounds.   No murmur heard. Pulmonary/Chest: Effort normal and breath sounds normal. No respiratory distress. She has no wheezes.  Genitourinary:  Breasts: Examined lying and sitting.  Right: Without masses, retractions, discharge or axillary adenopathy.  Left: Without masses, retractions, discharge or axillary adenopathy.  Inguinal/mons: Normal without inguinal adenopathy  External genitalia: Normal  BUS/Urethra/Skene's glands: Normal  Bladder: Normal  Vagina: Normal- no lesions  Cervix: absent Uterus: absent Adnexa/parametria:  Rt: Without masses or tenderness.  Lt: Without masses or tenderness.  Anus and perineum: Normal    Neurological: She is alert and oriented to person, place, and time.  Psychiatric: She has a normal mood and affect. Her behavior is normal. Judgment and thought content normal.          Assessment & Plan:

## 2014-05-10 NOTE — Assessment & Plan Note (Signed)
BP stable on amlodipine, continue same.

## 2014-07-09 ENCOUNTER — Telehealth: Payer: Self-pay | Admitting: Internal Medicine

## 2014-07-09 ENCOUNTER — Telehealth: Payer: Self-pay

## 2014-07-09 ENCOUNTER — Ambulatory Visit (INDEPENDENT_AMBULATORY_CARE_PROVIDER_SITE_OTHER): Payer: BC Managed Care – PPO | Admitting: Family Medicine

## 2014-07-09 ENCOUNTER — Encounter: Payer: Self-pay | Admitting: Family Medicine

## 2014-07-09 VITALS — BP 130/60 | HR 72 | Temp 98.2°F | Ht 64.0 in | Wt 203.0 lb

## 2014-07-09 DIAGNOSIS — J0100 Acute maxillary sinusitis, unspecified: Secondary | ICD-10-CM

## 2014-07-09 MED ORDER — FEXOFENADINE HCL 180 MG PO TABS: 180.0000 mg | ORAL_TABLET | Freq: Every day | ORAL | Status: DC

## 2014-07-09 MED ORDER — AMOXICILLIN 875 MG PO TABS: 875.0000 mg | ORAL_TABLET | Freq: Two times a day (BID) | ORAL | Status: DC

## 2014-07-09 MED ORDER — ALPRAZOLAM 0.25 MG PO TABS: 0.1250 mg | ORAL_TABLET | Freq: Every evening | ORAL | Status: DC | PRN

## 2014-07-09 NOTE — Telephone Encounter (Signed)
Rx printed, awaiting MD signature.  

## 2014-07-09 NOTE — Telephone Encounter (Signed)
Patient arrived at office and was seen by Dr. Birdie Riddle.

## 2014-07-09 NOTE — Telephone Encounter (Signed)
Patient Name: Veronica [name]  DOB: May 19, 1950    Initial Comment caller states [place] w/[facility]; states pt is having SOB and throat congestion; lost her voice;    Nurse Assessment  Nurse: Mallie Mussel, RN, Alveta Heimlich Date/Time (Eastern Time): 07/09/2014 8:22:22 AM  Confirm and document reason for call. If symptomatic, describe symptoms. ---Caller states that her mother does not have SOB as previously indicated. She has bad allergies. There is a lot of phlegm in her throat and she has become hoarse. She denies redness, swelling and pain around her cheeks, eyes and forehead. She was cleaning out a rental house yesterday and it activated her allergies. Throughout the day, it became worse. She is requesting to be seen. She has a lot of green phlegm. She thinks she has an infection and is wanting antibiotics. She took Benadryl last night but it didn't help at all. Denies fever. She does have a sore throat pain which she rates as 5 on 0-10 scale.  Has the patient traveled out of the country within the last 30 days? ---No  Does the patient require triage? ---Yes  Related visit to physician within the last 2 weeks? ---No  Does the PT have any chronic conditions? (i.e. diabetes, asthma, etc.) ---Yes  List chronic conditions. ---Hypercholesterolemia, Allergies     Guidelines    Guideline Title Affirmed Question Affirmed Notes  Sore Throat [1] Sore throat is the only symptom AND [2] present > 48 hours started a couple days ago   Final Disposition User   See PCP When Office is Open (within 3 days) Mallie Mussel, RN, Alveta Heimlich    Comments  Appointment made for today at 11:30 with Dr. Dimple Nanas.

## 2014-07-09 NOTE — Telephone Encounter (Signed)
-----   Message from Colon Branch, MD sent at 07/09/2014 12:51 PM EDT ----- Regarding: RE: rx request [name] RF 30 and 1 JP   ----- Message -----    From: Midge Minium, MD    Sent: 07/09/2014  11:48 AM      To: Colon Branch, MD Subject: rx request                                     Saw pt as an acute today and is requesting refill on Alprazolam.  Told her I would notify you and defer to your decision.  Thanks! KT

## 2014-07-09 NOTE — Progress Notes (Signed)
   Subjective:    Patient ID: Veronica [name], female    DOB: [birth year], 64 y.o.   MRN: [id number]  HPI URI- pt has hx of allergies to dust and dog hair.  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  Taking Allegra w/o relief.  Benadryl worsens sxs.  Now having sinus pain/pressure, HA, nasal congestion w/ green mucous, chest congestion, intermittent cough.  No fevers.  + laryngitis.   Review of Systems For ROS see HPI     Objective:   Physical Exam  Constitutional: She appears well-developed and well-nourished. No distress.  HENT:  Head: Normocephalic and atraumatic.  Right Ear: Tympanic membrane normal.  Left Ear: Tympanic membrane normal.  Nose: Mucosal edema and rhinorrhea present. Right sinus exhibits maxillary sinus tenderness. Right sinus exhibits no frontal sinus tenderness. Left sinus exhibits maxillary sinus tenderness. Left sinus exhibits no frontal sinus tenderness.  Mouth/Throat: Uvula is midline and mucous membranes are normal. Posterior oropharyngeal erythema present. No oropharyngeal exudate.  Eyes: Conjunctivae and EOM are normal. Pupils are equal, round, and reactive to light.  Neck: Normal range of motion. Neck supple.  Cardiovascular: Normal rate, regular rhythm and normal heart sounds.   Pulmonary/Chest: Effort normal and breath sounds normal. No respiratory distress. She has no wheezes.  Lymphadenopathy:    She has no cervical adenopathy.  Vitals reviewed.         Assessment & Plan:

## 2014-07-09 NOTE — Patient Instructions (Signed)
Follow up as needed Start the Amoxicillin twice daily- take w/ food Drink plenty of fluids Continue the Allegra daily Vocal rest will restore your voice more quickly Call with any questions or concerns Hang in there!!!

## 2014-07-09 NOTE — Telephone Encounter (Signed)
Patient states that she is having asthma problems and is having a little SOB. Call transferred to [facility]

## 2014-07-09 NOTE — Telephone Encounter (Signed)
Rx faxed to [facility] pharmacy.  

## 2014-07-09 NOTE — Assessment & Plan Note (Signed)
New.  Pt's sxs and PE consistent w/ infxn.  Start abx.  Prescription sent for allergy medication.  Reviewed supportive care and red flags that should prompt return.  Pt expressed understanding and is in agreement w/ plan.

## 2014-07-09 NOTE — Progress Notes (Signed)
Pre visit review using our clinic review tool, if applicable. No additional management support is needed unless otherwise documented below in the visit note. 

## 2014-09-10 ENCOUNTER — Other Ambulatory Visit: Payer: Self-pay | Admitting: Internal Medicine

## 2014-09-10 DIAGNOSIS — Z1231 Encounter for screening mammogram for malignant neoplasm of breast: Secondary | ICD-10-CM

## 2014-10-07 ENCOUNTER — Ambulatory Visit (HOSPITAL_COMMUNITY)
Admission: RE | Admit: 2014-10-07 | Discharge: 2014-10-07 | Disposition: A | Discharge status: Home / Self Care | Payer: BC Managed Care – PPO | Source: Ambulatory Visit | Attending: Internal Medicine | Admitting: Internal Medicine

## 2014-10-07 DIAGNOSIS — Z1231 Encounter for screening mammogram for malignant neoplasm of breast: Secondary | ICD-10-CM | POA: Diagnosis present

## 2014-10-21 ENCOUNTER — Telehealth: Payer: Self-pay | Admitting: Internal Medicine

## 2014-10-21 NOTE — Telephone Encounter (Signed)
Caller name: Veronica [name]  °Relationship to patient: Self °Can be reached: [phone] ° ° °Reason for call: pt called in because she need a Health Certificate form completed. She says that she has a few vaccinations that need to be scheduled.  1. Tb Test.      2. HepB   3. MMR  ° ° ° ° °

## 2014-10-21 NOTE — Telephone Encounter (Signed)
Pt is overdue for complete physical exam. Please have Pt schedule at her convenience (okay to put 2-15 min appts together if needed). We can try to complete paperwork and immunizations at that visit. Thank you.

## 2014-10-22 NOTE — Telephone Encounter (Signed)
Called pt back she says that she will just go into a walk in clinic to get vaccinations and get papers filled out. Pt will just come in for CPE in November.

## 2014-10-24 ENCOUNTER — Ambulatory Visit (INDEPENDENT_AMBULATORY_CARE_PROVIDER_SITE_OTHER): Payer: BC Managed Care – PPO | Admitting: Family Medicine

## 2014-10-24 ENCOUNTER — Ambulatory Visit (INDEPENDENT_AMBULATORY_CARE_PROVIDER_SITE_OTHER): Payer: BC Managed Care – PPO

## 2014-10-24 VITALS — BP 164/76 | HR 72 | Temp 98.3°F | Resp 16 | Ht 66.0 in | Wt 203.0 lb

## 2014-10-24 DIAGNOSIS — H547 Unspecified visual loss: Secondary | ICD-10-CM

## 2014-10-24 DIAGNOSIS — R42 Dizziness and giddiness: Secondary | ICD-10-CM | POA: Diagnosis not present

## 2014-10-24 DIAGNOSIS — Z0000 Encounter for general adult medical examination without abnormal findings: Secondary | ICD-10-CM

## 2014-10-24 DIAGNOSIS — R6889 Other general symptoms and signs: Secondary | ICD-10-CM | POA: Diagnosis not present

## 2014-10-24 DIAGNOSIS — I10 Essential (primary) hypertension: Secondary | ICD-10-CM

## 2014-10-24 DIAGNOSIS — E785 Hyperlipidemia, unspecified: Secondary | ICD-10-CM | POA: Diagnosis not present

## 2014-10-24 DIAGNOSIS — M7989 Other specified soft tissue disorders: Secondary | ICD-10-CM | POA: Diagnosis not present

## 2014-10-24 DIAGNOSIS — R7611 Nonspecific reaction to tuberculin skin test without active tuberculosis: Secondary | ICD-10-CM | POA: Diagnosis not present

## 2014-10-24 DIAGNOSIS — H43393 Other vitreous opacities, bilateral: Secondary | ICD-10-CM

## 2014-10-24 DIAGNOSIS — R079 Chest pain, unspecified: Secondary | ICD-10-CM | POA: Diagnosis not present

## 2014-10-24 DIAGNOSIS — R0789 Other chest pain: Secondary | ICD-10-CM | POA: Diagnosis not present

## 2014-10-24 DIAGNOSIS — Z23 Encounter for immunization: Secondary | ICD-10-CM | POA: Diagnosis not present

## 2014-10-24 DIAGNOSIS — Z9229 Personal history of other drug therapy: Secondary | ICD-10-CM

## 2014-10-24 DIAGNOSIS — Z789 Other specified health status: Secondary | ICD-10-CM | POA: Diagnosis not present

## 2014-10-24 DIAGNOSIS — Z9289 Personal history of other medical treatment: Secondary | ICD-10-CM

## 2014-10-24 LAB — POCT CBC
Granulocyte percent: 58.9 %G (ref 37–80)
HEMATOCRIT: 37.1 % — AB (ref 37.7–47.9)
HEMOGLOBIN: 12 g/dL — AB (ref 12.2–16.2)
Lymph, poc: 2.1 (ref 0.6–3.4)
MCH, POC: 28.2 pg (ref 27–31.2)
MCHC: 32.4 g/dL (ref 31.8–35.4)
MCV: 87 fL (ref 80–97)
MID (cbc): 0.2 (ref 0–0.9)
MPV: 7.8 fL (ref 0–99.8)
POC GRANULOCYTE: 3.4 (ref 2–6.9)
POC LYMPH PERCENT: 36.8 %L (ref 10–50)
POC MID %: 4.3 %M (ref 0–12)
Platelet Count, POC: 228 10*3/uL (ref 142–424)
RBC: 4.26 M/uL (ref 4.04–5.48)
RDW, POC: 13.7 %
WBC: 5.8 10*3/uL (ref 4.6–10.2)

## 2014-10-24 LAB — GLUCOSE, POCT (MANUAL RESULT ENTRY): POC GLUCOSE: 114 mg/dL — AB (ref 70–99)

## 2014-10-24 NOTE — Progress Notes (Addendum)
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 treatment for stress.The goals of stress management are reducing stressful life events and coping with stress in healthy ways.  Techniques for reducing stressful life events include the following: 13. Stress identification. Self-monitor for stress and identify what causes stress for you. These skills may help you to avoid some stressful events. 14. Time management. Set your priorities, keep a calendar of events, and learn to say "no." These tools can help you avoid making too many commitments. Techniques for coping with stress include the following:  Rethinking the problem. Try to think realistically about stressful events rather than ignoring them or overreacting. Try to find the positives in a stressful situation rather than focusing on the negatives.  Exercise. Physical exercise can release both physical and emotional tension. The key is to find a form of exercise you enjoy and do it regularly.  Relaxation  techniques. These relax the body and mind. Examples include yoga, meditation, tai chi, biofeedback, deep breathing, progressive muscle relaxation, listening to music, being out in nature, journaling, and other hobbies. Again, the key is to find one or more that you enjoy and can do regularly.  Healthy lifestyle. Eat a balanced diet, get plenty of sleep, and do not smoke. Avoid using alcohol or drugs to relax.  Strong support network. Spend time with family, friends, or other people you enjoy being around.Express your feelings and talk things over with someone you trust. Counseling or talktherapy with a mental health professional may be helpful if you are having difficulty managing stress on your own. Medicine is typically not recommended for the treatment of stress.Talk to your health care provider if you think you need medicine for symptoms of stress. HOME CARE INSTRUCTIONS  Keep all follow-up visits as directed by your health care provider.  Take all medicines as directed by your health care provider. SEEK MEDICAL CARE IF:  Your symptoms get worse or you start having new symptoms.  You feel overwhelmed by your problems and can no longer manage them on your own. SEEK IMMEDIATE MEDICAL CARE IF:  You feel like hurting yourself or someone else. Document Released: 07/11/2000 Document Revised: 06/01/2013 Document Reviewed: 09/09/2012 [facility] Patient Information 2015 [place], [place]. This information is not intended to replace advice given to you by your health care provider. Make sure you discuss any questions you have with your health care provider.   Keeping You Healthy  Get These Tests  Blood Pressure- Have your blood pressure checked by your healthcare provider at least once a year.  Normal blood pressure is 120/80.  Weight- Have your body mass index (BMI) calculated to screen for obesity.  BMI is a measure of body fat based on height and weight.  You can calculate your own BMI at  [employer]  Cholesterol- Have your cholesterol checked every year.  Diabetes- Have your blood sugar checked every year if you have high blood pressure, high cholesterol, a family history of diabetes or if you are overweight.  Pap Test - Have a pap test every 1 to 5 years if you have been sexually active.  If you are older than 65 and recent pap tests have been normal you  may not need additional pap tests.  In addition, if you have had a hysterectomy  for benign disease additional pap tests are not necessary.  Mammogram-Yearly mammograms are essential for early detection of breast cancer  Screening for Colon Cancer- Colonoscopy starting at age 68. Screening may begin sooner depending on your family history and other health conditions.  Follow up colonoscopy as directed by your Gastroenterologist.  Screening for Osteoporosis- Screening begins at age 57 with bone density scanning, sooner if you are at higher risk for developing Osteoporosis.  Get these medicines  Calcium with Vitamin D- Your body requires 1200-1500 mg of Calcium a day and [phone] IU of Vitamin D a day.  You can only absorb 500 mg of Calcium at a time therefore Calcium must be taken in 2 or 3 separate doses throughout the day.  Hormones- Hormone therapy has been associated with increased risk for certain cancers and heart disease.  Talk to your healthcare provider about if you need relief from menopausal symptoms.  Aspirin- Ask your healthcare provider about taking Aspirin to prevent Heart Disease and Stroke.  Get these Immuniztions  Flu shot- Every fall  Pneumonia shot- Once after the age of 19; if you are younger ask your healthcare provider if you need a pneumonia shot.  Tetanus- Every ten years.  Zostavax- Once after the age of 7 to prevent shingles.  Take these steps  Don't smoke- Your healthcare provider can help you quit. For tips on how to quit, ask your healthcare provider or go to  www.smokefree.gov or call 1-800 QUIT-NOW.  Be physically active- Exercise 5 days a week for a minimum of 30 minutes.  If you are not already physically active, start slow and gradually work up to 30 minutes of moderate physical activity.  Try walking, dancing, bike riding, swimming, etc.  Eat a healthy diet- Eat a variety of healthy foods such as fruits, vegetables, whole grains, low fat milk, low fat cheeses, yogurt, lean meats, chicken, fish, eggs, dried beans, tofu, etc.  For more information go to www.thenutritionsource.org  Dental visit- Brush and floss teeth twice daily; visit your dentist twice a year.  Eye exam- Visit your Optometrist or Ophthalmologist yearly.  Drink alcohol in moderation- Limit alcohol intake to one drink or less a day.  Never drink and drive.  Depression- Your emotional health is as important as your physical health.  If you're feeling down or losing interest in things you normally enjoy, please talk to your healthcare provider.  Seat Belts- can save your life; always wear one  Smoke/Carbon Monoxide detectors- These detectors need to be installed on the appropriate level of your home.  Replace batteries at least once a year.  Violence- If anyone is threatening or hurting you, please tell your healthcare provider.  Living Will/ Health care power of attorney- Discuss with your healthcare provider and family.    I personally performed the services described in this documentation, which was scribed in my presence. The recorded information has been reviewed and considered, and addended by me as needed.     By signing my name below, I, Moises Blood, attest that this documentation has been prepared under the direction and in the presence of Merri Ray, MD. Electronically Signed: Moises Blood, [place]. 10/24/2014 , 4:16 PM .

## 2014-10-24 NOTE — Patient Instructions (Addendum)
Blood pressure was elevated in the office. Keep a record of your blood pressures outside of the office and bring them to the next office visit. If it is remaining over 140/90 at home, recommend follow-up in the next week with your primary care provider or here if unable to see them to discuss changes to medication. Keep a record of your blood pressures outside of the office and bring them to the next office visit.  I will check some blood work for the other symptoms that we discussed today, but follow-up with your primary care provider or provider here to discuss the swelling in your hands, dizziness, lightheadedness, pain in your back, numbness in your leg, and stress further.  If any worsening of symptoms prior to that time, recommend follow-up here sooner, or the emergency room if you feel it is necessary.  If any worsening of your chest pain or chest pain with exertion, call 911 or proceed to the emergency room.  You were given a Tdap injection today.  I recommend a flu vaccine, so if you change your mind - have that done here or any location providing flu vaccines.  I completed the paperwork for your job, but as we discussed avoid any heavy lifting if possible. Follow-up with your primary care provider or [place] if needed to discuss back pain further.  Return in next week for fasting cholesterol test. The other tests checked today we should have results within a week to 10 days.  we will call you with those results.  Chest Pain (Nonspecific) It is often hard to give a specific diagnosis for the cause of chest pain. There is always a chance that your pain could be related to something serious, such as a heart attack or a blood clot in the lungs. You need to follow up with your health care provider for further evaluation. CAUSES  1. Heartburn. 2. Pneumonia or bronchitis. 3. Anxiety or stress. 4. Inflammation around your heart (pericarditis) or lung (pleuritis or pleurisy). 5. A blood clot in the  lung. 6. A collapsed lung (pneumothorax). It can develop suddenly on its own (spontaneous pneumothorax) or from trauma to the chest. 7. Shingles infection (herpes zoster virus). The chest wall is composed of bones, muscles, and cartilage. Any of these can be the source of the pain. 1. The bones can be bruised by injury. 2. The muscles or cartilage can be strained by coughing or overwork. 3. The cartilage can be affected by inflammation and become sore (costochondritis). DIAGNOSIS  Lab tests or other studies may be needed to find the cause of your pain. Your health care provider may have you take a test called an ambulatory electrocardiogram (ECG). An ECG records your heartbeat patterns over a 24-hour period. You may also have other tests, such as: 1. Transthoracic echocardiogram (TTE). During echocardiography, sound waves are used to evaluate how blood flows through your heart. 2. Transesophageal echocardiogram (TEE). 3. Cardiac monitoring. This allows your health care provider to monitor your heart rate and rhythm in real time. 4. Holter monitor. This is a portable device that records your heartbeat and can help diagnose heart arrhythmias. It allows your health care provider to track your heart activity for several days, if needed. 5. Stress tests by exercise or by giving medicine that makes the heart beat faster. TREATMENT  1. Treatment depends on what may be causing your chest pain. Treatment may include: 1. Acid blockers for heartburn. 2. Anti-inflammatory medicine. 3. Pain medicine for inflammatory conditions. 4.  Antibiotics if an infection is present. 2. You may be advised to change lifestyle habits. This includes stopping smoking and avoiding alcohol, caffeine, and chocolate. 3. You may be advised to keep your head raised (elevated) when sleeping. This reduces the chance of acid going backward from your stomach into your esophagus. Most of the time, nonspecific chest pain will improve  within 2-3 days with rest and mild pain medicine.  HOME CARE INSTRUCTIONS   If antibiotics were prescribed, take them as directed. Finish them even if you start to feel better.  For the next few days, avoid physical activities that bring on chest pain. Continue physical activities as directed.  Do not use any tobacco products, including cigarettes, chewing tobacco, or electronic cigarettes.  Avoid drinking alcohol.  Only take medicine as directed by your health care provider.  Follow your health care provider's suggestions for further testing if your chest pain does not go away.  Keep any follow-up appointments you made. If you do not go to an appointment, you could develop lasting (chronic) problems with pain. If there is any problem keeping an appointment, call to reschedule. SEEK MEDICAL CARE IF:   Your chest pain does not go away, even after treatment.  You have a rash with blisters on your chest.  You have a fever. SEEK IMMEDIATE MEDICAL CARE IF:   You have increased chest pain or pain that spreads to your arm, neck, jaw, back, or abdomen.  You have shortness of breath.  You have an increasing cough, or you cough up blood.  You have severe back or abdominal pain.  You feel nauseous or vomit.  You have severe weakness.  You faint.  You have chills. This is an emergency. Do not wait to see if the pain will go away. Get medical help at once. Call your local emergency services (911 in [place] Do not drive yourself to the hospital. MAKE SURE YOU:   Understand these instructions.  Will watch your condition.  Will get help right away if you are not doing well or get worse. Document Released: 10/25/2004 Document Revised: 01/20/2013 Document Reviewed: 08/21/2007 [facility] Patient Information 2015 [place], [place]. This information is not intended to replace advice given to you by your health care provider. Make sure you discuss any questions you have with your health care  provider.  Dizziness Dizziness is a common problem. It is a feeling of unsteadiness or light-headedness. You may feel like you are about to faint. Dizziness can lead to injury if you stumble or fall. A person of any age group can suffer from dizziness, but dizziness is more common in older adults. CAUSES  Dizziness can be caused by many different things, including: 8. Middle ear problems. 9. Standing for too long. 10. Infections. 11. An allergic reaction. 7. Aging. 13. An emotional response to something, such as the sight of blood. 14. Side effects of medicines. 15. Tiredness. 16. Problems with circulation or blood pressure. 17. Excessive use of alcohol or medicines, or illegal drug use. 18. Breathing too fast (hyperventilation). 19. An irregular heart rhythm (arrhythmia). 20. A low red blood cell count (anemia). 21. Pregnancy. 22. Vomiting, diarrhea, fever, or other illnesses that cause body fluid loss (dehydration). 23. Diseases or conditions such as Parkinson's disease, high blood pressure (hypertension), diabetes, and thyroid problems. 24. Exposure to extreme heat. DIAGNOSIS  Your health care provider will ask about your symptoms, perform a physical exam, and perform an electrocardiogram (ECG) to record the electrical activity of your heart.  Your health care provider may also perform other heart or blood tests to determine the cause of your dizziness. These may include: 4. Transthoracic echocardiogram (TTE). During echocardiography, sound waves are used to evaluate how blood flows through your heart. 5. Transesophageal echocardiogram (TEE). 6. Cardiac monitoring. This allows your health care provider to monitor your heart rate and rhythm in real time. 7. Holter monitor. This is a portable device that records your heartbeat and can help diagnose heart arrhythmias. It allows your health care provider to track your heart activity for several days if needed. 8. Stress tests by exercise  or by giving medicine that makes the heart beat faster. TREATMENT  Treatment of dizziness depends on the cause of your symptoms and can vary greatly. HOME CARE INSTRUCTIONS  6. Drink enough fluids to keep your urine clear or pale yellow. This is especially important in very hot weather. In older adults, it is also important in cold weather. 7. Take your medicine exactly as directed if your dizziness is caused by medicines. When taking blood pressure medicines, it is especially important to get up slowly. 1. Rise slowly from chairs and steady yourself until you feel okay. 2. In the morning, first sit up on the side of the bed. When you feel okay, stand slowly while holding onto something until you know your balance is fine. 8. Move your legs often if you need to stand in one place for a long time. Tighten and relax your muscles in your legs while standing. 9. Have someone stay with you for 1-2 days if dizziness continues to be a problem. Do this until you feel you are well enough to stay alone. Have the person call your health care provider if he or she notices changes in you that are concerning. 10. Do not drive or use heavy machinery if you feel dizzy. 11. Do not drink alcohol. SEEK IMMEDIATE MEDICAL CARE IF:  4. Your dizziness or light-headedness gets worse. 5. You feel nauseous or vomit. 6. You have problems talking, walking, or using your arms, hands, or legs. 7. You feel weak. 8. You are not thinking clearly or you have trouble forming sentences. It may take a friend or family member to notice this. 9. You have chest pain, abdominal pain, shortness of breath, or sweating. 10. Your vision changes. 11. You notice any bleeding. 12. You have side effects from medicine that seems to be getting worse rather than better. MAKE SURE YOU:   Understand these instructions.  Will watch your condition.  Will get help right away if you are not doing well or get worse. Document Released: 07/11/2000  Document Revised: 01/20/2013 Document Reviewed: 08/04/2010 [facility] Patient Information 2015 [place], [place]. This information is not intended to replace advice given to you by your health care provider. Make sure you discuss any questions you have with your health care provider.   Stress and Stress Management Stress is a normal reaction to life events. It is what you feel when life demands more than you are used to or more than you can handle. Some stress can be useful. For example, the stress reaction can help you catch the last bus of the day, study for a test, or meet a deadline at work. But stress that occurs too often or for too long can cause problems. It can affect your emotional health and interfere with relationships and normal daily activities. Too much stress can weaken your immune system and increase your risk for physical illness. If you  already have a medical problem, stress can make it worse. CAUSES  All sorts of life events may cause stress. An event that causes stress for one person may not be stressful for another person. Major life events commonly cause stress. These may be positive or negative. Examples include losing your job, moving into a new home, getting married, having a baby, or losing a loved one. Less obvious life events may also cause stress, especially if they occur day after day or in combination. Examples include working long hours, driving in traffic, caring for children, being in debt, or being in a difficult relationship. SIGNS AND SYMPTOMS Stress may cause emotional symptoms including, the following: 25. Anxiety. This is feeling worried, afraid, on edge, overwhelmed, or out of control. 26. Anger. This is feeling irritated or impatient. 27. Depression. This is feeling sad, down, helpless, or guilty. 28. Difficulty focusing, remembering, or making decisions. Stress may cause physical symptoms, including the following:  9. Aches and pains. These may affect your head,  neck, back, stomach, or other areas of your body. 10. Tight muscles or clenched jaw. 11. Low energy or trouble sleeping. Stress may cause unhealthy behaviors, including the following:  12. Eating to feel better (overeating) or skipping meals. 13. Sleeping too little, too much, or both. 26. Working too much or putting off tasks (procrastination). 15. Smoking, drinking alcohol, or using drugs to feel better. DIAGNOSIS  Stress is diagnosed through an assessment by your health care provider. Your health care provider will ask questions about your symptoms and any stressful life events.Your health care provider will also ask about your medical history and may order blood tests or other tests. Certain medical conditions and medicine can cause physical symptoms similar to stress. Mental illness can cause emotional symptoms and unhealthy behaviors similar to stress. Your health care provider may refer you to a mental health professional for further evaluation.  TREATMENT  Stress management is the recommended treatment for stress.The goals of stress management are reducing stressful life events and coping with stress in healthy ways.  Techniques for reducing stressful life events include the following: 13. Stress identification. Self-monitor for stress and identify what causes stress for you. These skills may help you to avoid some stressful events. 14. Time management. Set your priorities, keep a calendar of events, and learn to say "no." These tools can help you avoid making too many commitments. Techniques for coping with stress include the following:  Rethinking the problem. Try to think realistically about stressful events rather than ignoring them or overreacting. Try to find the positives in a stressful situation rather than focusing on the negatives.  Exercise. Physical exercise can release both physical and emotional tension. The key is to find a form of exercise you enjoy and do it  regularly.  Relaxation techniques. These relax the body and mind. Examples include yoga, meditation, tai chi, biofeedback, deep breathing, progressive muscle relaxation, listening to music, being out in nature, journaling, and other hobbies. Again, the key is to find one or more that you enjoy and can do regularly.  Healthy lifestyle. Eat a balanced diet, get plenty of sleep, and do not smoke. Avoid using alcohol or drugs to relax.  Strong support network. Spend time with family, friends, or other people you enjoy being around.Express your feelings and talk things over with someone you trust. Counseling or talktherapy with a mental health professional may be helpful if you are having difficulty managing stress on your own. Medicine is typically not recommended  for the treatment of stress.Talk to your health care provider if you think you need medicine for symptoms of stress. HOME CARE INSTRUCTIONS  Keep all follow-up visits as directed by your health care provider.  Take all medicines as directed by your health care provider. SEEK MEDICAL CARE IF:  Your symptoms get worse or you start having new symptoms.  You feel overwhelmed by your problems and can no longer manage them on your own. SEEK IMMEDIATE MEDICAL CARE IF:  You feel like hurting yourself or someone else. Document Released: 07/11/2000 Document Revised: 06/01/2013 Document Reviewed: 09/09/2012 [facility] Patient Information 2015 [place], [place]. This information is not intended to replace advice given to you by your health care provider. Make sure you discuss any questions you have with your health care provider.   Keeping You Healthy  Get These Tests  Blood Pressure- Have your blood pressure checked by your healthcare provider at least once a year.  Normal blood pressure is 120/80.  Weight- Have your body mass index (BMI) calculated to screen for obesity.  BMI is a measure of body fat based on height and weight.  You can  calculate your own BMI at [employer].it  Cholesterol- Have your cholesterol checked every year.  Diabetes- Have your blood sugar checked every year if you have high blood pressure, high cholesterol, a family history of diabetes or if you are overweight.  Pap Test - Have a pap test every 1 to 5 years if you have been sexually active.  If you are older than 65 and recent pap tests have been normal you may not need additional pap tests.  In addition, if you have had a hysterectomy  for benign disease additional pap tests are not necessary.  Mammogram-Yearly mammograms are essential for early detection of breast cancer  Screening for Colon Cancer- Colonoscopy starting at age 35. Screening may begin sooner depending on your family history and other health conditions.  Follow up colonoscopy as directed by your Gastroenterologist.  Screening for Osteoporosis- Screening begins at age 66 with bone density scanning, sooner if you are at higher risk for developing Osteoporosis.  Get these medicines  Calcium with Vitamin D- Your body requires 1200-1500 mg of Calcium a day and [phone] IU of Vitamin D a day.  You can only absorb 500 mg of Calcium at a time therefore Calcium must be taken in 2 or 3 separate doses throughout the day.  Hormones- Hormone therapy has been associated with increased risk for certain cancers and heart disease.  Talk to your healthcare provider about if you need relief from menopausal symptoms.  Aspirin- Ask your healthcare provider about taking Aspirin to prevent Heart Disease and Stroke.  Get these Immuniztions  Flu shot- Every fall  Pneumonia shot- Once after the age of 55; if you are younger ask your healthcare provider if you need a pneumonia shot.  Tetanus- Every ten years.  Zostavax- Once after the age of 50 to prevent shingles.  Take these steps  Don't smoke- Your healthcare provider can help you quit. For tips on how to quit, ask your healthcare  provider or go to www.smokefree.gov or call 1-800 QUIT-NOW.  Be physically active- Exercise 5 days a week for a minimum of 30 minutes.  If you are not already physically active, start slow and gradually work up to 30 minutes of moderate physical activity.  Try walking, dancing, bike riding, swimming, etc.  Eat a healthy diet- Eat a variety of healthy foods such as fruits, vegetables,  whole grains, low fat milk, low fat cheeses, yogurt, lean meats, chicken, fish, eggs, dried beans, tofu, etc.  For more information go to www.thenutritionsource.org  Dental visit- Brush and floss teeth twice daily; visit your dentist twice a year.  Eye exam- Visit your Optometrist or Ophthalmologist yearly.  Drink alcohol in moderation- Limit alcohol intake to one drink or less a day.  Never drink and drive.  Depression- Your emotional health is as important as your physical health.  If you're feeling down or losing interest in things you normally enjoy, please talk to your healthcare provider.  Seat Belts- can save your life; always wear one  Smoke/Carbon Monoxide detectors- These detectors need to be installed on the appropriate level of your home.  Replace batteries at least once a year.  Violence- If anyone is threatening or hurting you, please tell your healthcare provider.  Living Will/ Health care power of attorney- Discuss with your healthcare provider and family.

## 2014-10-25 LAB — COMPLETE METABOLIC PANEL WITH GFR
ALT: 17 U/L (ref 6–29)
AST: 20 U/L (ref 10–35)
Albumin: 4.4 g/dL (ref 3.6–5.1)
Alkaline Phosphatase: 65 U/L (ref 33–130)
BUN: 12 mg/dL (ref 7–25)
CHLORIDE: 103 mmol/L (ref 98–110)
CO2: 27 mmol/L (ref 20–31)
Calcium: 9.5 mg/dL (ref 8.6–10.4)
Creat: 0.77 mg/dL (ref 0.50–0.99)
GFR, Est Non African American: 83 mL/min (ref >=60)
GLUCOSE: 102 mg/dL — AB (ref 65–99)
Potassium: 3.8 mmol/L (ref 3.5–5.3)
SODIUM: 138 mmol/L (ref 135–146)
Total Bilirubin: 0.6 mg/dL (ref 0.2–1.2)
Total Protein: 7 g/dL (ref 6.1–8.1)

## 2014-10-25 LAB — TSH: TSH: 0.464 u[IU]/mL (ref 0.350–4.500)

## 2014-10-26 ENCOUNTER — Other Ambulatory Visit (INDEPENDENT_AMBULATORY_CARE_PROVIDER_SITE_OTHER): Payer: BC Managed Care – PPO

## 2014-10-26 DIAGNOSIS — E785 Hyperlipidemia, unspecified: Secondary | ICD-10-CM | POA: Diagnosis not present

## 2014-10-26 LAB — LIPID PANEL
Cholesterol: 128 mg/dL (ref 125–200)
HDL: 47 mg/dL (ref >=46)
LDL Cholesterol: 66 mg/dL (ref <130)
Total CHOL/HDL Ratio: 2.7 Ratio (ref <=5.0)
Triglycerides: 77 mg/dL (ref <150)
VLDL: 15 mg/dL (ref <30)

## 2014-11-03 ENCOUNTER — Telehealth: Payer: Self-pay

## 2014-11-03 NOTE — Telephone Encounter (Signed)
Pt calling about labs. Still having HA's. Please review. Thanks

## 2014-11-06 NOTE — Telephone Encounter (Signed)
See lab note.  

## 2014-11-17 ENCOUNTER — Other Ambulatory Visit: Payer: Self-pay | Admitting: Internal Medicine

## 2014-11-17 MED ORDER — ALPRAZOLAM 0.25 MG PO TABS: 0.1250 mg | ORAL_TABLET | Freq: Every evening | ORAL | Status: DC | PRN

## 2014-11-17 NOTE — Addendum Note (Signed)
Addended by: Kelle Darting A on: 11/17/2014 05:12 PM   Modules accepted: Orders

## 2014-11-17 NOTE — Telephone Encounter (Signed)
Caller name: Cerita Rabelo   Relationship to patient: Self   Can be reached: [phone]  Pharmacy:[id number] - [place], [address] [place]  Reason for call: pt is requesting a refill on her medication ALPRAZolam.

## 2014-11-17 NOTE — Telephone Encounter (Signed)
Notified pt. 

## 2014-11-17 NOTE — Telephone Encounter (Signed)
Okay #60 and one refill 

## 2014-11-17 NOTE — Telephone Encounter (Signed)
Pt is requesting refill on Alprazolam.  Last OV: 10/26/2013, CPE scheduled for 12/09/2014 at 0900 Last Fill: 07/09/2014 #30 and 1 RF UDS: 10/26/2013 Low risk  Pt needs UDS and contract at next OV.  Please advise.

## 2014-11-17 NOTE — Telephone Encounter (Signed)
Left refill on pharmacy voicemail, #30 x 1 refill since pt only taking 1/2 to 1 tablet at bedtime.

## 2014-12-09 ENCOUNTER — Encounter: Payer: BC Managed Care – PPO | Admitting: Internal Medicine

## 2014-12-25 ENCOUNTER — Other Ambulatory Visit: Payer: Self-pay | Admitting: Internal Medicine

## 2015-01-05 ENCOUNTER — Telehealth: Payer: Self-pay | Admitting: Internal Medicine

## 2015-01-05 ENCOUNTER — Other Ambulatory Visit: Payer: Self-pay | Admitting: Internal Medicine

## 2015-01-05 MED ORDER — ATORVASTATIN CALCIUM 20 MG PO TABS: 20.0000 mg | ORAL_TABLET | Freq: Every day | ORAL | Status: DC

## 2015-01-05 NOTE — Telephone Encounter (Signed)
Relation to [id number] Call back number:[phone] Pharmacy: [id number] - [place], [place] - [address] [phone] (Phone) [phone] (Fax)         Reason for call:  Patient requesting a refill atorvastatin (LIPITOR) 20 MG tablet

## 2015-01-05 NOTE — Telephone Encounter (Signed)
Pt has not seen Dr. Larose Kells since 09/2013, will send a 30 day supply however, she needs to make an appt to see him.

## 2015-01-07 NOTE — Telephone Encounter (Signed)
Patient informed, patient scheduled for 01/19/15 (Wed) 11:15 AM

## 2015-01-19 ENCOUNTER — Ambulatory Visit: Payer: BC Managed Care – PPO | Admitting: Internal Medicine

## 2015-01-20 ENCOUNTER — Ambulatory Visit (INDEPENDENT_AMBULATORY_CARE_PROVIDER_SITE_OTHER): Payer: BC Managed Care – PPO | Admitting: Medical

## 2015-01-20 ENCOUNTER — Encounter: Payer: Self-pay | Admitting: Medical

## 2015-01-20 ENCOUNTER — Other Ambulatory Visit: Payer: Self-pay | Admitting: Medical

## 2015-01-20 VITALS — BP 130/80 | HR 59 | Temp 97.9°F | Ht 66.0 in | Wt 209.0 lb

## 2015-01-20 DIAGNOSIS — E785 Hyperlipidemia, unspecified: Secondary | ICD-10-CM

## 2015-01-20 DIAGNOSIS — M5432 Sciatica, left side: Secondary | ICD-10-CM

## 2015-01-20 DIAGNOSIS — R0600 Dyspnea, unspecified: Secondary | ICD-10-CM | POA: Diagnosis not present

## 2015-01-20 DIAGNOSIS — I10 Essential (primary) hypertension: Secondary | ICD-10-CM

## 2015-01-20 MED ORDER — MELOXICAM 7.5 MG PO TABS: 7.5000 mg | ORAL_TABLET | Freq: Every day | ORAL | Status: DC

## 2015-01-20 MED ORDER — ALBUTEROL SULFATE HFA 108 (90 BASE) MCG/ACT IN AERS: 2.0000 | INHALATION_SPRAY | Freq: Four times a day (QID) | RESPIRATORY_TRACT | Status: DC | PRN

## 2015-01-20 MED ORDER — ATORVASTATIN CALCIUM 20 MG PO TABS: 20.0000 mg | ORAL_TABLET | Freq: Every day | ORAL | Status: DC

## 2015-01-20 MED ORDER — AMLODIPINE BESYLATE 5 MG PO TABS: 5.0000 mg | ORAL_TABLET | Freq: Every morning | ORAL | Status: DC

## 2015-01-20 NOTE — Progress Notes (Signed)
Subjective:    Patient ID: Veronica [name], female    DOB: [birth year], 64 y.o.   MRN: [id number]  HPI Pt speaks spanish and [place]. But she states person who she talked with earlier did not understand her. She is not sob now.   Pt states going to travel to [place]. Over in [place] is currently summer. At times when she is exposed to hot air she will feel weak.Then states will feel suffocated.(like she can't take full deep breath).  Pt is going to [place], 2014. She may be down there for 3 months. In past this happened a lot. She would go indoors with ac and her symptoms resoled quickly. She is concerned this could happen again. Also she states this happens in summer here. No hx of asthma and no hx of smoking.  Pt is not taking her blood pressure medicine for 4 months. Dr. Larose Kells told her to take bp daily. In past when was taking was 127/75.  She had no side effects from med just indicates did not think it was necessary.  Pt has 4 family member with CAD or stroke.Both parents and 3 siblings. So advised continue lipitor. No side effects reported from. No chest pain reported.   Review of Systems  Constitutional: Negative for fever, chills, diaphoresis, activity change and fatigue.  Respiratory: Positive for shortness of breath. Negative for cough, chest tightness and wheezing.        Suffocation sensation if very hot weather. .  Cardiovascular: Negative for chest pain, palpitations and leg swelling.  Gastrointestinal: Negative for nausea, vomiting and abdominal pain.  Musculoskeletal: Negative for neck pain and neck stiffness.       Lt side sciatica. Not new hx of per pt. Pcp has evaluated her for this per her report. Describes pain at time radiates from lt si area to left thigh/ hamstring area  Neurological: Negative for dizziness, weakness and headaches.  Psychiatric/Behavioral: Negative for behavioral problems, confusion and agitation. The patient is not nervous/anxious.        Objective:   Physical Exam  General  Mental Status - Alert. General Appearance - Well groomed. Not in acute distress.  Skin Rashes- No Rashes.  HEENT Head- Normal. Ear Auditory Canal - Left- Normal. Right - Normal.Tympanic Membrane- Left- Normal. Right- Normal. Eye Sclera/Conjunctiva- Left- Normal. Right- Normal. Nose & Sinuses Nasal Mucosa- Left-  Not oggy or Congested. Right-  Not  boggy or Congested. Mouth & Throat Lips: Upper Lip- Normal: no dryness, cracking, pallor, cyanosis, or vesicular eruption. Lower Lip-Normal: no dryness, cracking, pallor, cyanosis or vesicular eruption. Buccal Mucosa- Bilateral- No Aphthous ulcers. Oropharynx- No Discharge or Erythema. Tonsils: Characteristics- Bilateral- No Erythema or Congestion. Size/Enlargement- Bilateral- No enlargement. Discharge- bilateral-None.  Neck Neck- Supple. No Masses. No jvd.   Chest and Lung Exam Auscultation: Breath Sounds:- even and unlabored.lying supine no sob.  Cardiovascular Auscultation:Rythm- Regular, rate and rhythm. Murmurs & Other Heart Sounds:Ausculatation of the heart reveal- No Murmurs.  Lymphatic Head & Neck General Head & Neck Lymphatics: Bilateral: Description- No Localized lymphadenopathy.  Lower ext- no pedal edema. Negative homans signs. Symmetric calfs.       Assessment & Plan:  For your sensation of suffocation when you are exposed to very hot weather, I am making albuterol available to try since you will be traveling to [place] in January and you expect exposure to very hot weather. You state in past if this happens you will get in air conditioned room and your symptoms would  resolve. So do this again and keep hydrated. But can try albuterol as well. But if symptoms are not resolving quickly then be seen by provider in [place] ED or a physician. If any chest pain advise ED evaluation immediate.  For your bp I am refilling your bp med. You state you are not taking but I do recommend you at least have it on  hand in [place] in case you bp increases. If any bp over 140/90 definitely restart amlodipine.  For elevated cholesterol refilling our lipid med.  For sciatica rx meloxicam. Stop any other nsaid.  Follow up  In 3 months or sooner(when back from [place])

## 2015-01-20 NOTE — Patient Instructions (Addendum)
For your sensation of suffocation when you are exposed to very hot weather, I am making albuterol available to try since you will be traveling to [place] in January and you expect exposure to very hot weather. You state in past if this happens you will get in air conditioned room and your symptoms would resolve. So do this again and keep hydrated. But can try albuterol as well. But if symptoms are not resolving quickly then be seen by provider in [place] ED or a physician. If any chest pain advise ED evaluation immediate.  For your bp I am refilling your bp med. You state you are not taking but I do recommend you at least have it on hand in [place] in case you bp increases. If any bp over 140/90 definitely restart amlodipine.  For elevated cholesterol refilling our lipid med.  For sciatica rx meloxicam. Stop any other nsaid.  Follow up  In 3 months or sooner(when back from [place])  Also will order cxr. Ask MA to get pt to return for this next week before she leaves on her trip.

## 2015-01-20 NOTE — Telephone Encounter (Signed)
Please call pt. With her dyspnea symptoms reported on and off. i do want her to get cxr this coming week. I want next week. Since she is leaving for [place] in early January. i forgot to tell her this when she was in.

## 2015-01-20 NOTE — Progress Notes (Signed)
Pre visit review using our clinic review tool, if applicable. No additional management support is needed unless otherwise documented below in the visit note. 

## 2015-01-21 NOTE — Telephone Encounter (Signed)
Spoke with pt and she voices understanding about getting cxr next week.

## 2015-01-21 NOTE — Telephone Encounter (Signed)
Left message for pt to come in and have a cxr before leaving the country and to give the office a call if she has any questions.

## 2015-01-25 ENCOUNTER — Ambulatory Visit (HOSPITAL_BASED_OUTPATIENT_CLINIC_OR_DEPARTMENT_OTHER)
Admission: RE | Admit: 2015-01-25 | Discharge: 2015-01-25 | Disposition: A | Discharge status: Home / Self Care | Payer: BC Managed Care – PPO | Source: Ambulatory Visit | Attending: Medical | Admitting: Medical

## 2015-01-25 DIAGNOSIS — R0602 Shortness of breath: Secondary | ICD-10-CM | POA: Diagnosis not present

## 2015-01-25 DIAGNOSIS — R0600 Dyspnea, unspecified: Secondary | ICD-10-CM | POA: Diagnosis present

## 2015-04-29 ENCOUNTER — Ambulatory Visit (INDEPENDENT_AMBULATORY_CARE_PROVIDER_SITE_OTHER): Payer: BC Managed Care – PPO | Admitting: Internal Medicine

## 2015-04-29 ENCOUNTER — Encounter: Payer: Self-pay | Admitting: Internal Medicine

## 2015-04-29 VITALS — BP 92/68 | HR 67 | Temp 98.3°F | Ht 66.0 in | Wt 193.0 lb

## 2015-04-29 DIAGNOSIS — R0789 Other chest pain: Secondary | ICD-10-CM | POA: Diagnosis not present

## 2015-04-29 DIAGNOSIS — F411 Generalized anxiety disorder: Secondary | ICD-10-CM

## 2015-04-29 DIAGNOSIS — E785 Hyperlipidemia, unspecified: Secondary | ICD-10-CM

## 2015-04-29 DIAGNOSIS — I10 Essential (primary) hypertension: Secondary | ICD-10-CM | POA: Diagnosis not present

## 2015-04-29 DIAGNOSIS — Z09 Encounter for follow-up examination after completed treatment for conditions other than malignant neoplasm: Secondary | ICD-10-CM | POA: Insufficient documentation

## 2015-04-29 MED ORDER — ALPRAZOLAM 0.25 MG PO TABS: 0.1250 mg | ORAL_TABLET | Freq: Three times a day (TID) | ORAL | Status: DC | PRN

## 2015-04-29 MED ORDER — ESCITALOPRAM OXALATE 10 MG PO TABS: 10.0000 mg | ORAL_TABLET | Freq: Every day | ORAL | Status: DC

## 2015-04-29 NOTE — Patient Instructions (Signed)
GO TO THE FRONT DESK Schedule your next appointment for a  Follow up When? 4 weeks    Fasting?  No     Check the  blood pressure daily Be sure your blood pressure is between 110/65 and  145/85. If it is consistently higher or lower, let me know

## 2015-04-29 NOTE — Assessment & Plan Note (Signed)
HTN: Recently increased BP in the context of severe anxiety. BP today is low. Discontinue losartan, HCTZ and go back to amlodipine. Monitor BPs. Anxiety depression:  Severe stress lately, will dc clonazepam prescribed elsewhere, cont Xanax as needed, start Lexapro. Side effects discussed. Hyperlipidemia: Not taking Lipitor for the last 4 weeks, recommend to restart. CP-SOB: In the context of severe anxiety, recent echocardiogram essentially negative. Sx resolve. Likely due to anxiety, if symptoms resurface will need further eval. RTC 4 weeks

## 2015-04-29 NOTE — Progress Notes (Signed)
Pre visit review using our clinic review tool, if applicable. No additional management support is needed unless otherwise documented below in the visit note. 

## 2015-04-29 NOTE — Progress Notes (Signed)
Subjective:    Patient ID: Veronica [name], female    DOB: 08-22-1950, 65 y.o.   MRN: [id number]  DOS:  04/29/2015 Type of visit - description : Acute visit Interval history: She went to visit [place] few days ago, she visited the mountains at a very high area more than 3000 mt above [place] level, got  short of breath and BP was elevated. She also was extremely anxious, she actually was in a disaster area. Then she developed chest pain, anteriorly, some radiation to the neck, not associated with nausea, diaphoresis, no exertional. Pain was triggered by feeling anxious.  Went to see local doctors, labs, EKG and echocardiogram were done, see below. BP was elevated, they discontinue amlodipine and prescribed valsartan and HCTZ. They prescribe clonazepam as well. She is here for follow-up.  CBC was normal, creatinine 0.8, glucose 87 EKG normal sinus rhythm, at baseline. Echocardiogram showed  mild aortic insufficiency  Review of Systems  Currently feeling still very shaky and anxious. When asked, admits that she probably needs something more than Xanax as needed. No depression per se  No further chest pain or difficulty breathing No lower extremity edema No nausea vomiting No fever chills or cough.  Past Medical History  Diagnosis Date  . Allergic rhinitis   . Anxiety and depression   . Hemorrhoids   . Constipation, chronic   . Uterine fibroid   . Low back pain     w/ left side radiculopathy, s/p injection at L4-L5 forarmen 1/06  . Coronary artery disease     06-2010, had CP----> Mild nonobstructive CAD on cardiac CT   . Hypertension   . Leukoplakia of vagina   . Hyperlipidemia   . Anxiety   . Hip pain, bilateral     Past Surgical History  Procedure Laterality Date  . Tubal ligation      bilateral 1989  . Abdominal hysterectomy  11-2000    no oophorectomy per pt  . Leep      unknown date   . Cholecystectomy  12/08/2010    Procedure: LAPAROSCOPIC CHOLECYSTECTOMY WITH  INTRAOPERATIVE CHOLANGIOGRAM;  Surgeon: Pedro Earls, MD;  Location: [facility];  Service: General;  Laterality: N/A;  c-arm   . Colonoscopy with propofol N/A 02/02/2014    Procedure: COLONOSCOPY WITH PROPOFOL;  Surgeon: Garlan Fair, MD;  Location: [facility];  Service: Endoscopy;  Laterality: N/A;    Social History   Social History  . Marital Status: Divorced    Spouse Name: N/A  . Number of Children: 3  . Years of Education: N/A   Occupational History  . 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     Social History Main Topics  . Smoking status: Never Smoker   . Smokeless tobacco: Never Used  . Alcohol Use: Yes     Comment: rarely  . Drug Use: No  . Sexual Activity: No   Other Topics Concern  . Not on file   Social History Narrative   Divorced, lives w/ 1 of her children   Original from [place], living in [place] since age 43                          Medication List       This list is accurate as of: 04/29/15  6:41 PM.  Always use your most recent med list.  ALPRAZolam 0.25 MG tablet  Commonly known as:  XANAX  Take 0.5-1 tablets (0.125-0.25 mg total) by mouth 3 (three) times daily as needed for anxiety or sleep.     amLODipine 5 MG tablet  Commonly known as:  NORVASC  Take 1 tablet (5 mg total) by mouth every morning.     atorvastatin 20 MG tablet  Commonly known as:  LIPITOR  Take 1 tablet (20 mg total) by mouth at bedtime.     CASCARA SAGRADA PO  Take 3 capsules by mouth every other day. Reported on 04/29/2015     escitalopram 10 MG tablet  Commonly known as:  LEXAPRO  Take 1 tablet (10 mg total) by mouth daily.     vitamin B-12 1000 MCG tablet  Commonly known as:  CYANOCOBALAMIN  Take 1,000 mcg by mouth daily. Reported on 04/29/2015           Objective:   Physical Exam BP 92/68 mmHg  Pulse 67  Temp(Src) 98.3 F (36.8 C) (Oral)  Ht 5\' 6"  (1.676 m)  Wt 193 lb (87.544 kg)  BMI 31.17 kg/m2  SpO2 97% General:   Well  developed, well nourished . NAD.  HEENT:  Normocephalic . Face symmetric, atraumatic Lungs:  CTA B Normal respiratory effort, no intercostal retractions, no accessory muscle use. Heart: RRR,  no murmur.  No pretibial edema bilaterally  Skin: Not pale. Not jaundice Neurologic:  alert & oriented X3.  Speech normal, gait appropriate for age and unassisted Psych--  Cognition and judgment appear intact.  Cooperative with normal attention span and concentration.  Behavior appropriate. + anxious but no depressed appearing. Tearful from axiety      Assessment & Plan:   Assessment HTN Hyperlipidemia Anxiety depression Chronic constipation Chest pain 06/2010 cardiac CT ---Nonobstructive CAD Vaginal leukoplakia Hysterectomy  Plan: HTN: Recently increased BP in the context of severe anxiety. BP today is low. Discontinue losartan, HCTZ and go back to amlodipine. Monitor BPs. Anxiety :  Severe stress lately, will dc clonazepam prescribed elsewhere, cont Xanax as needed, start Lexapro. Side effects discussed. Hyperlipidemia: Not taking Lipitor for the last 4 weeks, recommend to restart. CP-SOB: In the context of severe anxiety, recent echocardiogram essentially negative. Sx resolve. Likely due to anxiety, if symptoms resurface will need further eval. RTC 4 weeks

## 2015-05-10 ENCOUNTER — Telehealth: Payer: Self-pay | Admitting: Internal Medicine

## 2015-05-10 NOTE — Telephone Encounter (Signed)
If it was increased only one time or once or twice a week it is okay, if it is persistently elevated let me know. Continue monitoring BPs. Follow-up by the end of the month as recommended.

## 2015-05-10 NOTE — Telephone Encounter (Signed)
Relation to [id number] Call back number:[phone]   Reason for call:  Patient was advised to call PCP when BP was over 145 patient checked BP this afternoon it was 147/76

## 2015-05-10 NOTE — Telephone Encounter (Signed)
Please advise 

## 2015-05-10 NOTE — Telephone Encounter (Signed)
Spoke w/ Pt, informed her of Dr. Larose Kells recommendations and scheduled appt for 05/23/2015 at [place]. Pt verbalized understanding.

## 2015-05-23 ENCOUNTER — Ambulatory Visit (INDEPENDENT_AMBULATORY_CARE_PROVIDER_SITE_OTHER): Payer: BC Managed Care – PPO | Admitting: Internal Medicine

## 2015-05-23 ENCOUNTER — Encounter: Payer: Self-pay | Admitting: Internal Medicine

## 2015-05-23 VITALS — BP 132/82 | HR 66 | Temp 97.9°F | Resp 16 | Ht 66.0 in | Wt 189.8 lb

## 2015-05-23 DIAGNOSIS — I10 Essential (primary) hypertension: Secondary | ICD-10-CM

## 2015-05-23 DIAGNOSIS — F411 Generalized anxiety disorder: Secondary | ICD-10-CM

## 2015-05-23 NOTE — Assessment & Plan Note (Signed)
HTN: cont Amlodipine, BP is in the  120s, very seldom sees a higher number. Continue monitoring. Continue with healthy lifestyle Anxiety: Self dc Lexapro due to insomnia --Panic attacks well controlled on Xanax only. Contract and UDS today. --she has chronic anxiety, characterized by inability to drive, recommend to see a counselor. Retry SSRI in the future? Chest pain, difficulty breathing: Resolved RTC 3 months, CPX

## 2015-05-23 NOTE — Progress Notes (Signed)
Pre visit review using our clinic review tool, if applicable. No additional management support is needed unless otherwise documented below in the visit note. 

## 2015-05-23 NOTE — Progress Notes (Signed)
Subjective:    Patient ID: Veronica [name], female    DOB: 1950/09/26, 65 y.o.   MRN: [id number]  DOS:  05/23/2015 Type of visit - description : Follow-up Interval history: Since the last office visit, she had to discontinue Lexapro due to insomnia, continue with Xanax as needed and all the other medications. Ambulatory BPs range from 120/69 to 150/80, the great majority of readings are in the 120s, only 1 time reached 150.     Review of Systems  denied chest pain, difficulty breathing No nausea, vomiting, diarrhea   Past Medical History  Diagnosis Date  . Allergic rhinitis   . Anxiety and depression   . Hemorrhoids   . Constipation, chronic   . Uterine fibroid   . Low back pain     w/ left side radiculopathy, s/p injection at L4-L5 [facility] 1/06  . Coronary artery disease     06-2010, had CP----> Mild nonobstructive CAD on cardiac CT   . Hypertension   . Leukoplakia of vagina   . Hyperlipidemia   . Anxiety   . Hip pain, bilateral     Past Surgical History  Procedure Laterality Date  . Tubal ligation      bilateral 1989  . Abdominal hysterectomy  11-2000    no oophorectomy per pt  . Leep      unknown date   . Cholecystectomy  12/08/2010    Procedure: LAPAROSCOPIC CHOLECYSTECTOMY WITH INTRAOPERATIVE CHOLANGIOGRAM;  Surgeon: Pedro Earls, MD;  Location: [facility];  Service: General;  Laterality: N/A;  c-arm   . Colonoscopy with propofol N/A 02/02/2014    Procedure: COLONOSCOPY WITH PROPOFOL;  Surgeon: Garlan Fair, MD;  Location: [facility];  Service: Endoscopy;  Laterality: N/A;    Social History   Social History  . Marital Status: Divorced    Spouse Name: N/A  . Number of Children: 3  . Years of Education: N/A   Occupational History  . 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     Social History Main Topics  . Smoking status: Never Smoker   . Smokeless tobacco: Never Used  . Alcohol Use: Yes     Comment: rarely  . Drug Use: No  . Sexual  Activity: No   Other Topics Concern  . Not on file   Social History Narrative   Divorced, lives w/ 1 of her children   Original from [place], living in [place] since age 5                          Medication List       This list is accurate as of: 05/23/15 12:50 PM.  Always use your most recent med list.               ALPRAZolam 0.25 MG tablet  Commonly known as:  XANAX  Take 0.5-1 tablets (0.125-0.25 mg total) by mouth 3 (three) times daily as needed for anxiety or sleep.     amLODipine 5 MG tablet  Commonly known as:  NORVASC  Take 1 tablet (5 mg total) by mouth every morning.     atorvastatin 20 MG tablet  Commonly known as:  LIPITOR  Take 1 tablet (20 mg total) by mouth at bedtime.     CASCARA SAGRADA PO  Take 3 capsules by mouth every other day. Reported on 04/29/2015     vitamin B-12 1000 MCG tablet  Commonly known as:  CYANOCOBALAMIN  Take 1,000 mcg by mouth daily. Reported on 04/29/2015           Objective:   Physical Exam BP 132/82 mmHg  Pulse 66  Temp(Src) 97.9 F (36.6 C) (Oral)  Resp 16  Ht 5\' 6"  (1.676 m)  Wt 189 lb 12.8 oz (86.093 kg)  BMI 30.65 kg/m2  SpO2 98% General:   Well developed, well nourished . NAD.  HEENT:  Normocephalic . Face symmetric, atraumatic Lungs:  CTA B Normal respiratory effort, no intercostal retractions, no accessory muscle use. Heart: RRR,  no murmur.  No pretibial edema bilaterally  Skin: Not pale. Not jaundice Neurologic:  alert & oriented X3.  Speech normal, gait appropriate for age and unassisted Psych--  Cognition and judgment appear intact.  Cooperative with normal attention span and concentration.  Behavior appropriate. No anxious or depressed appearing.      Assessment & Plan:   Assessment HTN Hyperlipidemia Anxiety: Chronic (can't drive) and acute panic attacks 03-2015. Depression Chronic constipation Chest pain 06/2010 cardiac CT ---Nonobstructive CAD Vaginal  leukoplakia Hysterectomy  Plan: HTN: cont Amlodipine, BP is in the  120s, very seldom sees a higher number. Continue monitoring. Continue with healthy lifestyle Anxiety: Self dc Lexapro due to insomnia --Panic attacks well controlled on Xanax only. Contract and UDS today. --she has chronic anxiety, characterized by inability to drive, recommend to see a counselor. Retry SSRI in the future? Chest pain, difficulty breathing: Resolved RTC 3 months, CPX

## 2015-05-23 NOTE — Patient Instructions (Signed)
Go to the lab: Provide a urine sample for a UDS   GO TO THE FRONT DESK Schedule your next appointment for a physical exam in 3 months, fasting  Continue exercising and eating healthy.  Consider see a counselor for anxiety

## 2015-05-27 ENCOUNTER — Telehealth: Payer: Self-pay | Admitting: Internal Medicine

## 2015-05-27 NOTE — Telephone Encounter (Signed)
Please schedule

## 2015-05-27 NOTE — Telephone Encounter (Signed)
Pt scheduled  

## 2015-05-27 NOTE — Telephone Encounter (Signed)
Please advise if okay to schedule new patient

## 2015-05-27 NOTE — Telephone Encounter (Signed)
Pt would like to which to switch to Dr Betty Martinique from Dr. Kathlene November due to transportation.  Can I schedule?

## 2015-05-27 NOTE — Telephone Encounter (Signed)
It is ok with me. ?BJ ?

## 2015-05-27 NOTE — Telephone Encounter (Signed)
Yes, thank you.

## 2015-07-28 ENCOUNTER — Encounter: Payer: Self-pay | Admitting: Internal Medicine

## 2015-07-29 ENCOUNTER — Encounter: Payer: Self-pay | Admitting: Family Medicine

## 2015-07-29 ENCOUNTER — Ambulatory Visit: Payer: BC Managed Care – PPO | Admitting: Family Medicine

## 2015-07-29 ENCOUNTER — Ambulatory Visit (INDEPENDENT_AMBULATORY_CARE_PROVIDER_SITE_OTHER): Payer: BC Managed Care – PPO | Admitting: Family Medicine

## 2015-07-29 VITALS — BP 120/70 | HR 66 | Temp 98.7°F | Resp 12 | Ht 66.0 in | Wt 188.0 lb

## 2015-07-29 DIAGNOSIS — I10 Essential (primary) hypertension: Secondary | ICD-10-CM

## 2015-07-29 DIAGNOSIS — F419 Anxiety disorder, unspecified: Secondary | ICD-10-CM

## 2015-07-29 MED ORDER — FLUOXETINE HCL 20 MG PO TABS: 20.0000 mg | ORAL_TABLET | Freq: Every day | ORAL | Status: DC

## 2015-07-29 NOTE — Patient Instructions (Signed)
A few things to remember from today's visit:   1. Essential hypertension   2. Anxiety disorder, unspecified  - FLUoxetine (PROZAC) 20 MG tablet; Take 1 tablet (20 mg total) by mouth daily.  Dispense: 30 tablet; Refill: 3   Stop Xanax. Continue Clonazepam 0.5 mg 1/2 tab daily as needed.  Fluoxetine 1/2 tab daily, can increase to 1 tab in 2 weeks.    If you sign-up for My chart, you can communicate easier with [place] in case you have any question or concern.

## 2015-07-29 NOTE — Progress Notes (Signed)
Pre visit review using our clinic review tool, if applicable. No additional management support is needed unless otherwise documented below in the visit note. 

## 2015-07-29 NOTE — Progress Notes (Signed)
HPI:   Ms.Xoie E Trost is a 65 y.o. female, who is here today to establish care with me.  Former PCP: Dr Larose Kells, who moved to [facility].  Last preventive routine visit: 09/2014. History of HLD, HTN, and anxiety.  Concerns today: HTN, she was to be sure her blood pressure is at goal. Recently she was in [place] and reporting elevated blood pressure, which was finally attributed to anxiety.   Hypertension: Long Hx. Currently she is on Amlodipine 5 mg. She taking medications as instructed, no side effects reported.  She has not noted unusual headache, visual changes, exertional chest pain, dyspnea,  focal weakness, or edema.  Lab Results  Component Value Date   CREATININE 0.77 10/24/2014   BUN 12 10/24/2014   NA 138 10/24/2014   K 3.8 10/24/2014   CL 103 10/24/2014   CO2 27 10/24/2014    BP readings at home 130's/60-70's    She exercises regularly and eats healthy.  Anxiety: Dx about 30 years.  She feels like her anxiety is getting worse for the past few months. She denies any Hx of depression. She denies suicidal ideations.   She is currently on Xanax 0.25 mg, which she tries to take as needed and has taken it for 2 years.  When she was in [place] she was started on Clonazepam 0.5 and it helps her sleep, 6 hours. She is tolerated well and denies side effects. She doesn't recall to be on any SSRIs in the past.   FHx positive for anxiety.  She lives alone, daughter and son live close by.    Review of Systems  Constitutional: Negative for fever, activity change, appetite change, fatigue and unexpected weight change.  HENT: Negative for mouth sores, nosebleeds and trouble swallowing.   Eyes: Negative for redness and visual disturbance.  Respiratory: Negative for cough, shortness of breath and wheezing.   Cardiovascular: Negative for chest pain, palpitations and leg swelling.  Gastrointestinal: Negative for nausea, vomiting and abdominal pain.       Negative  for changes in bowel habits.  Genitourinary: Negative for dysuria, hematuria, decreased urine volume and difficulty urinating.  Skin: Negative for color change and rash.  Neurological: Negative for seizures, syncope, weakness, numbness and headaches.  Psychiatric/Behavioral: Positive for sleep disturbance. Negative for hallucinations and confusion. The patient is nervous/anxious.       Current Outpatient Prescriptions on File Prior to Visit  Medication Sig Dispense Refill  . amLODipine (NORVASC) 5 MG tablet Take 1 tablet (5 mg total) by mouth every morning. 90 tablet 2  . atorvastatin (LIPITOR) 20 MG tablet Take 1 tablet (20 mg total) by mouth at bedtime. 90 tablet 0  . CASCARA SAGRADA PO Take 3 capsules by mouth every other day. Reported on 04/29/2015    . vitamin B-12 (CYANOCOBALAMIN) 1000 MCG tablet Take 1,000 mcg by mouth daily. Reported on 04/29/2015     No current facility-administered medications on file prior to visit.     Past Medical History  Diagnosis Date  . Allergic rhinitis   . Anxiety and depression   . Hemorrhoids   . Constipation, chronic   . Uterine fibroid   . Low back pain     w/ left side radiculopathy, s/p injection at L4-L5 forarmen 1/06  . Coronary artery disease     06-2010, had CP----> Mild nonobstructive CAD on cardiac CT   . Hypertension   . Leukoplakia of vagina   . Hyperlipidemia   .  Anxiety   . Hip pain, bilateral    No Known Allergies  Family History  Problem Relation Age of Onset  . Schizophrenia Son   . Schizophrenia Brother   . Schizophrenia Other     2 nieces  . Diabetes Neg Hx   . Heart attack Neg Hx   . Colon cancer Neg Hx   . Breast cancer Neg Hx     Social History   Social History  . Marital Status: Divorced    Spouse Name: N/A  . Number of Children: 3  . Years of Education: N/A   Occupational History  . 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     Social History Main Topics  . Smoking status: Never Smoker   .  Smokeless tobacco: Never Used  . Alcohol Use: Yes     Comment: rarely  . Drug Use: No  . Sexual Activity: No   Other Topics Concern  . None   Social History Narrative   Divorced, lives w/ 1 of her children   Original from [place], living in [place] since age 3                      Filed Vitals:   07/29/15 1407  BP: 120/70  Pulse: 66  Temp: 98.7 F (37.1 C)  Resp: 12    Body mass index is 30.36 kg/(m^2).  SpO2 Readings from Last 3 Encounters:  07/29/15 98%  05/23/15 98%  04/29/15 97%       Physical Exam  Constitutional: She is oriented to person, place, and time. She appears well-developed. No distress.  HENT:  Head: Atraumatic.  Mouth/Throat: Oropharynx is clear and moist and mucous membranes are normal.  Eyes: Conjunctivae and EOM are normal. Pupils are equal, round, and reactive to light.  Cardiovascular: Normal rate and regular rhythm.   No murmur heard. Pulses:      Dorsalis pedis pulses are 2+ on the right side, and 2+ on the left side.  Respiratory: Effort normal and breath sounds normal. No respiratory distress.  GI: Soft. She exhibits no mass. There is no tenderness.  Musculoskeletal: She exhibits no edema.  Lymphadenopathy:    She has no cervical adenopathy.  Neurological: She is alert and oriented to person, place, and time. She has normal strength. Coordination and gait normal.  Skin: Skin is warm. No erythema.  Psychiatric: Her mood appears anxious.  Well groomed, good eye contact.      ASSESSMENT AND PLAN:     Uarda was seen today for transfer from [place].  Diagnoses and all orders for this visit:  Essential hypertension  Adequately controlled. No changes in current management. Low salt diet recommended. Eye exam recommended annually, she is due. Lab work next [place].  Anxiety disorder, unspecified  Getting worse. She agrees with stopping Xanax, she will continue clonazepam 0.5 mg half tablet daily as needed (has tabs left from Rx in  [place]). I think she will benefit from an SSRI, she agrees to try fluoxetine. She is going to start with 10 mg and instructed to increase it to 20 mg in 2 weeks if it is well tolerated. We discussed some side effects of medications. She will follow in 3 weeks, before if needed.  -     FLUoxetine (PROZAC) 20 MG tablet; Take 1 tablet (20 mg total) by mouth daily.          Betty G. Martinique, MD  [facility] [place] office.

## 2015-08-26 ENCOUNTER — Ambulatory Visit (INDEPENDENT_AMBULATORY_CARE_PROVIDER_SITE_OTHER): Payer: BC Managed Care – PPO | Admitting: Family Medicine

## 2015-08-26 ENCOUNTER — Encounter: Payer: Self-pay | Admitting: Family Medicine

## 2015-08-26 VITALS — BP 120/70 | HR 70 | Temp 97.9°F | Ht 66.0 in | Wt 185.1 lb

## 2015-08-26 DIAGNOSIS — Z6829 Body mass index (BMI) 29.0-29.9, adult: Secondary | ICD-10-CM

## 2015-08-26 DIAGNOSIS — F419 Anxiety disorder, unspecified: Secondary | ICD-10-CM

## 2015-08-26 DIAGNOSIS — F5105 Insomnia due to other mental disorder: Secondary | ICD-10-CM | POA: Diagnosis not present

## 2015-08-26 MED ORDER — FLUOXETINE HCL 20 MG PO TABS: 20.0000 mg | ORAL_TABLET | Freq: Every day | ORAL | 1 refills | Status: DC

## 2015-08-26 NOTE — Patient Instructions (Signed)
A few things to remember from today's visit:   Anxiety disorder, unspecified - Plan: FLUoxetine (PROZAC) 20 MG tablet  Over-the-counter long extending Melatonin may also help with sleep.  No changes today I will see her back in November 2017. Physical.  Please be sure medication list is accurate. If a new problem present, please set up appointment sooner than planned today.

## 2015-08-26 NOTE — Progress Notes (Signed)
HPI:   Veronica [name] is a 65 y.o. female, who is here today to follow on anxiety and sleep.     Dx with anxiety about 30 years. I saw her last on 07/29/2015, at that time she was concerned about worsening anxiety.   She was started on fluoxetine 20 mg daily. Also last OV,  Xanax was discontinued and she agreed to continue with Clonazepam prn. She has tolerated medication well, today she is reporting a great deal of improvement, she denies any side effect. She is taking clonazepam 0.5 mg a quarter of a tablet at the time, she takes it at night and she has been able to sleep about 7 hours; she feels rested next day. She also started exercising since her last office visit, going to the [facility] a few times per week.    She denies depression or suicidal ideations. FHx positive for anxiety.   She lives alone, daughter and son live close by.   Concerns today: No weight loss since she started exercising. She is trying to follow a healthy diet.  She wonders if she needs a Pap smear as well as "liver and pancreas check." She denies any history of alcohol abuse, abnormal weight loss, abdominal pain, changes in bowel habits, blood in the stool, or melena. She is status post hysterectomy due to fibroid tumors, denies any vaginal bleeding or pelvic pain.     Review of Systems  Constitutional: Negative for activity change, appetite change, fatigue and unexpected weight change.  HENT: Negative for facial swelling, mouth sores and trouble swallowing.   Respiratory: Negative for cough, shortness of breath and wheezing.   Cardiovascular: Negative for chest pain, palpitations and leg swelling.  Gastrointestinal: Negative for abdominal pain, blood in stool, nausea and vomiting.       No changes in bowel habits.  Genitourinary: Negative for dysuria, hematuria, pelvic pain, vaginal bleeding and vaginal discharge.  Musculoskeletal: Negative for back pain, gait problem and myalgias.  Skin:  Negative for rash.  Neurological: Negative for dizziness, tremors, syncope, weakness, numbness and headaches.  Hematological: Negative for adenopathy. Does not bruise/bleed easily.  Psychiatric/Behavioral: Negative for confusion, hallucinations and sleep disturbance. The patient is nervous/anxious.       Current Outpatient Prescriptions on File Prior to Visit  Medication Sig Dispense Refill  . amLODipine (NORVASC) 5 MG tablet Take 1 tablet (5 mg total) by mouth every morning. 90 tablet 2  . atorvastatin (LIPITOR) 20 MG tablet Take 1 tablet (20 mg total) by mouth at bedtime. 90 tablet 0  . CASCARA SAGRADA PO Take 3 capsules by mouth every other day. Reported on 04/29/2015    . vitamin B-12 (CYANOCOBALAMIN) 1000 MCG tablet Take 1,000 mcg by mouth daily. Reported on 04/29/2015     No current facility-administered medications on file prior to visit.      Past Medical History:  Diagnosis Date  . Allergic rhinitis   . Anxiety   . Anxiety and depression   . Constipation, chronic   . Coronary artery disease    06-2010, had CP----> Mild nonobstructive CAD on cardiac CT   . Hemorrhoids   . Hip pain, bilateral   . Hyperlipidemia   . Hypertension   . Leukoplakia of vagina   . Low back pain    w/ left side radiculopathy, s/p injection at L4-L5 for[place] 1/06  . Uterine fibroid    No Known Allergies  Social History   Social History  . Marital status:  Divorced    Spouse name: N/A  . Number of children: 3  . Years of education: N/A   Occupational History  . 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  [employer]   Social History Main Topics  . Smoking status: Never Smoker  . Smokeless tobacco: Never Used  . Alcohol use Yes     Comment: rarely  . Drug use: No  . Sexual activity: No   Other Topics Concern  . None   Social History Narrative   Divorced, lives w/ 1 of her children   Original from [place], living in [place] since age 30                      Vitals:    08/26/15 1419  BP: 120/70  Pulse: 70  Temp: 97.9 F (36.6 C)   Body mass index is 29.88 kg/m.  Wt Readings from Last 3 Encounters:  08/26/15 185 lb 2 oz (84 kg)  07/29/15 188 lb (85.3 kg)  05/23/15 189 lb 12.8 oz (86.1 kg)     Physical Exam  Nursing note and vitals reviewed. Constitutional: She is oriented to person, place, and time. She appears well-developed. No distress.  HENT:  Mouth/Throat: Oropharynx is clear and moist and mucous membranes are normal.  Eyes: Conjunctivae and EOM are normal.  Cardiovascular: Normal rate and regular rhythm.   No murmur heard. Respiratory: Effort normal and breath sounds normal. No respiratory distress.  Lymphadenopathy:    She has no cervical adenopathy.  Neurological: She is alert and oriented to person, place, and time. She has normal strength. Coordination and gait normal.  Skin: Skin is warm. No rash noted. No erythema.  Psychiatric: Her speech is normal. Her mood appears anxious.  Well groomed, good eye contact.      ASSESSMENT AND PLAN:     Delinah was seen today for follow-up.  Diagnoses and all orders for this visit:  Insomnia secondary to anxiety  Improved. Continue clonazepam as needed at bedtime. Some side effects discussed. Good sleep hygiene. OTC melatonin extended release may also help. Follow-up in 4 months.   Adult BMI 29.0-29.9 kg/sq m  Actually she has lost about 3 pounds since her last visit on 07/29/2015. I explained consistency is very important, encouraged to continue healthy diet and regular exercise.  Anxiety disorder, unspecified  Improved. For now no changes in current management. Some side effects discussed. Follow-up in 4 months.   -     FLUoxetine (PROZAC) 20 MG tablet; Take 1 tablet (20 mg total) by mouth daily.      -We reviewed some current preventive guidelines, in November 2017 we are planning on doing a physical.      -Ms. VIANNA LOUDEN was advised to return sooner than  planned today if new concerns arise.       Ameli Sangiovanni G. Martinique, MD  [facility]. [place] office.

## 2015-08-26 NOTE — Progress Notes (Signed)
Pre visit review using our clinic review tool, if applicable. No additional management support is needed unless otherwise documented below in the visit note. 

## 2015-09-29 ENCOUNTER — Encounter: Payer: BC Managed Care – PPO | Admitting: Internal Medicine

## 2015-10-26 ENCOUNTER — Encounter: Payer: Self-pay | Admitting: Family Medicine

## 2015-10-26 ENCOUNTER — Ambulatory Visit (INDEPENDENT_AMBULATORY_CARE_PROVIDER_SITE_OTHER): Payer: BC Managed Care – PPO | Admitting: Family Medicine

## 2015-10-26 VITALS — BP 130/80 | HR 74 | Resp 12 | Ht 66.0 in | Wt 185.0 lb

## 2015-10-26 DIAGNOSIS — F419 Anxiety disorder, unspecified: Secondary | ICD-10-CM

## 2015-10-26 DIAGNOSIS — Z7189 Other specified counseling: Secondary | ICD-10-CM

## 2015-10-26 DIAGNOSIS — Z23 Encounter for immunization: Secondary | ICD-10-CM

## 2015-10-26 DIAGNOSIS — F5105 Insomnia due to other mental disorder: Secondary | ICD-10-CM | POA: Diagnosis not present

## 2015-10-26 DIAGNOSIS — Z7184 Encounter for health counseling related to travel: Secondary | ICD-10-CM

## 2015-10-26 MED ORDER — CLONAZEPAM 0.5 MG PO TABS: 0.2500 mg | ORAL_TABLET | Freq: Every day | ORAL | 1 refills | Status: DC | PRN

## 2015-10-26 MED ORDER — FLUOXETINE HCL 20 MG PO TABS: 20.0000 mg | ORAL_TABLET | Freq: Every day | ORAL | 1 refills | Status: DC

## 2015-10-26 NOTE — Progress Notes (Signed)
Pre visit review using our clinic review tool, if applicable. No additional management support is needed unless otherwise documented below in the visit note. 

## 2015-10-26 NOTE — Patient Instructions (Signed)
A few things to remember from today's visit:   Encounter for counseling for travel  Insomnia secondary to anxiety - Plan: clonazePAM (KLONOPIN) 0.5 MG tablet  Anxiety disorder, unspecified - Plan: clonazePAM (KLONOPIN) 0.5 MG tablet, FLUoxetine (PROZAC) 20 MG tablet  Today MMR and Hep vaccine. You Tdap is up to date, next one due 2026.  Drink water from reliable source, protection against muscular bites through insect repeller and clothing. Take you medications with you. Bush teet with bottle water. Wash hands.  Eat in safe/reliable places.   Please be sure medication list is accurate. If a new problem present, please set up appointment sooner than planned today.

## 2015-10-26 NOTE — Progress Notes (Signed)
HPI:  ACUTE VISIT:  Chief Complaint  Patient presents with  . Going out of country    [name] is a 65 y.o. female, who is here today for travel counseling. She is planning on travelling to [place] on 10/29/15 and will stay 5 days. She is going with her church for mission. This is her first time going but her church has been doing it for about 5 years, she was recommended to be sure she has Tdap and hepatitis A vaccination. According to pt, she will stay in a small town, not rural areas or places affected by recent hurricanes. She has Hx of anxiety, reporting great improvement with Prozac and using Clonazepam 0.5 mg 1/4-1/2 tab at bedtime to help with sleep.Anxiety is usually exacerbated by travel. She has been in other countries [place] before.  She does not take influenza vaccine. Not sure about MMR vaccination.   Review of Systems  Constitutional: Negative for activity change, appetite change, fatigue and fever.  HENT: Negative for congestion, facial swelling, mouth sores, nosebleeds, rhinorrhea, sore throat, trouble swallowing and voice change.   Eyes: Negative for discharge and redness.  Respiratory: Negative for cough, shortness of breath and wheezing.   Cardiovascular: Negative for chest pain, palpitations and leg swelling.  Gastrointestinal: Negative for abdominal pain, nausea and vomiting.       No changes in bowel habits.  Genitourinary: Negative for difficulty urinating and hematuria.  Musculoskeletal: Negative for gait problem.  Neurological: Negative for syncope, weakness, numbness and headaches.  Psychiatric/Behavioral: Negative for confusion and sleep disturbance. The patient is nervous/anxious.       Current Outpatient Prescriptions on File Prior to Visit  Medication Sig Dispense Refill  . amLODipine (NORVASC) 5 MG tablet Take 1 tablet (5 mg total) by mouth every morning. 90 tablet 2  . atorvastatin (LIPITOR) 20 MG tablet Take 1  tablet (20 mg total) by mouth at bedtime. 90 tablet 0  . CASCARA SAGRADA PO Take 3 capsules by mouth every other day. Reported on 04/29/2015    . vitamin B-12 (CYANOCOBALAMIN) 1000 MCG tablet Take 1,000 mcg by mouth daily. Reported on 04/29/2015     No current facility-administered medications on file prior to visit.      Past Medical History:  Diagnosis Date  . Allergic rhinitis   . Anxiety   . Anxiety and depression   . Constipation, chronic   . Coronary artery disease    06-2010, had CP----> Mild nonobstructive CAD on cardiac CT   . Hemorrhoids   . Hip pain, bilateral   . Hyperlipidemia   . Hypertension   . Leukoplakia of vagina   . Low back pain    w/ left side radiculopathy, s/p injection at L4-L5 for[place] 1/06  . Uterine fibroid    No Known Allergies  Social History   Social History  . Marital status: Divorced    Spouse name: N/A  . Number of children: 3  . Years of education: N/A   Occupational History  . 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  [employer]   Social History Main Topics  . Smoking status: Never Smoker  . Smokeless tobacco: Never Used  . Alcohol use Yes     Comment: rarely  . Drug use: No  . Sexual activity: No   Other Topics Concern  . None   Social History Narrative   Divorced, lives w/ 1 of her children   Original  from [place], living in [place] since age 86                      Vitals:   10/26/15 0937  BP: 130/80  Pulse: 74  Resp: 12   Body mass index is 29.86 kg/m.    Physical Exam  Nursing note and vitals reviewed. Constitutional: She is oriented to person, place, and time. She appears well-developed. No distress.  HENT:  Mouth/Throat: Oropharynx is clear and moist and mucous membranes are normal.  Eyes: Conjunctivae and EOM are normal.  Cardiovascular: Normal rate and regular rhythm.   No murmur heard. Respiratory: Effort normal and breath sounds normal. No respiratory distress.  Lymphadenopathy:    She  has no cervical adenopathy.  Neurological: She is alert and oriented to person, place, and time. She has normal strength. Coordination and gait normal.  Skin: Skin is warm. No erythema.  Psychiatric: Her speech is normal. Her mood appears anxious. Cognition and memory are normal.  Well groomed, good eye contact.      ASSESSMENT AND PLAN:     Veronica [name] was seen today for going out of country.  Diagnoses and all orders for this visit:  Encounter for counseling for travel  General safety recommendations discussed. Water and food supply from reliable source,hands wash, mosquito bite prevention measures. Take all her medications. Tdap is up to date, Hep A vaccination started today (should have be done earlier given the fact she is traveling in a few days), MMR recommended and given after verbal consent. We discussed other possible risk: traveller's diarrhea and typhoid but according to information she had received from her church these 2 are not a concern around the area she is visiting.   Insomnia secondary to anxiety  Stable. No changes in current management. Some side effects of Clonazepam discussed. F/U in 5-6 months.  -     clonazePAM (KLONOPIN) 0.5 MG tablet; Take 0.5-1 tablets (0.25-0.5 mg total) by mouth daily as needed for anxiety.  Anxiety disorder, unspecified  Stable. No changes in current management. F/U in 5-6 months, before if needed.  -     clonazePAM (KLONOPIN) 0.5 MG tablet; Take 0.5-1 tablets (0.25-0.5 mg total) by mouth daily as needed for anxiety. -     FLUoxetine (PROZAC) 20 MG tablet; Take 1 tablet (20 mg total) by mouth daily.      Return in about 5 months (around 03/27/2016) for anxiety,HTN,HLD.        Kekoa Fyock G. Martinique, MD  [facility]. [place] office.

## 2015-11-10 ENCOUNTER — Ambulatory Visit (INDEPENDENT_AMBULATORY_CARE_PROVIDER_SITE_OTHER): Payer: BC Managed Care – PPO | Admitting: Family Medicine

## 2015-11-10 ENCOUNTER — Encounter: Payer: Self-pay | Admitting: Family Medicine

## 2015-11-10 VITALS — BP 134/74 | HR 67 | Temp 97.9°F | Wt 185.0 lb

## 2015-11-10 DIAGNOSIS — R300 Dysuria: Secondary | ICD-10-CM

## 2015-11-10 LAB — POCT URINALYSIS DIPSTICK
BILIRUBIN UA: NEGATIVE
GLUCOSE UA: NEGATIVE
KETONES UA: NEGATIVE
Nitrite, UA: NEGATIVE
Protein, UA: NEGATIVE
RBC UA: NEGATIVE
Urobilinogen, UA: 0.2
pH, UA: 5.5

## 2015-11-10 MED ORDER — CEFDINIR 300 MG PO CAPS: 300.0000 mg | ORAL_CAPSULE | Freq: Two times a day (BID) | ORAL | 0 refills | Status: DC

## 2015-11-10 NOTE — Progress Notes (Signed)
Subjective:    Patient ID: Veronica [name], female    DOB: 1950-08-12, 65 y.o.   MRN: [id number]  HPI  Veronica [name] is a 65 year old female who presents today dysuria for 3 days. Associated symptom of suprapubic discomfort,  urinary frequency and urgency is present. Patient reports minimal amount of blood on the tissue after urination.  Denies fever, chills, sweats, flank pain, or N/V/D.   No recent antibiotic therapy; no history of recurrent UTIs.  No history of pyelonephritis No treatment at home; no aggravating or alleviating factors noted.   Review of Systems  Constitutional: Negative for chills, fatigue and fever.  Respiratory: Negative for cough, shortness of breath and wheezing.   Cardiovascular: Negative for chest pain and palpitations.  Gastrointestinal: Negative for abdominal pain, diarrhea, nausea and vomiting.  Genitourinary: Positive for dysuria, frequency and urgency. Negative for flank pain and vaginal discharge.  Musculoskeletal: Negative for myalgias.  Skin: Negative for rash.  Neurological: Negative for dizziness, light-headedness and headaches.   Past Medical History:  Diagnosis Date  . Allergic rhinitis   . Anxiety   . Anxiety and depression   . Constipation, chronic   . Coronary artery disease    06-2010, had CP----> Mild nonobstructive CAD on cardiac CT   . Hemorrhoids   . Hip pain, bilateral   . Hyperlipidemia   . Hypertension   . Leukoplakia of vagina   . Low back pain    w/ left side radiculopathy, s/p injection at L4-L5 forarmen 1/06  . Uterine fibroid      Social History   Social History  . Marital status: Divorced    Spouse name: N/A  . Number of children: 3  . Years of education: N/A   Occupational History  . 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  [employer]   Social History Main Topics  . Smoking status: Never Smoker  . Smokeless tobacco: Never Used  . Alcohol use Yes     Comment: rarely  . Drug use: No  . Sexual  activity: No   Other Topics Concern  . Not on file   Social History Narrative   Divorced, lives w/ 1 of her children   Original from [place], living in [place] since age 36                      Past Surgical History:  Procedure Laterality Date  . ABDOMINAL HYSTERECTOMY  11-2000   no oophorectomy per pt  . CHOLECYSTECTOMY  12/08/2010   Procedure: LAPAROSCOPIC CHOLECYSTECTOMY WITH INTRAOPERATIVE CHOLANGIOGRAM;  Surgeon: Pedro Earls, MD;  Location: [facility];  Service: General;  Laterality: N/A;  c-arm   . COLONOSCOPY WITH PROPOFOL N/A 02/02/2014   Procedure: COLONOSCOPY WITH PROPOFOL;  Surgeon: Garlan Fair, MD;  Location: [facility];  Service: Endoscopy;  Laterality: N/A;  . LEEP     unknown date   . TUBAL LIGATION     bilateral 1989    Family History  Problem Relation Age of Onset  . Schizophrenia Son   . Schizophrenia Brother   . Schizophrenia Other     2 nieces  . Diabetes Neg Hx   . Heart attack Neg Hx   . Colon cancer Neg Hx   . Breast cancer Neg Hx     No Known Allergies  Current Outpatient Prescriptions on File Prior to Visit  Medication Sig Dispense Refill  . amLODipine (NORVASC) 5 MG tablet Take 1  tablet (5 mg total) by mouth every morning. 90 tablet 2  . atorvastatin (LIPITOR) 20 MG tablet Take 1 tablet (20 mg total) by mouth at bedtime. 90 tablet 0  . CASCARA SAGRADA PO Take 3 capsules by mouth every other day. Reported on 04/29/2015    . clonazePAM (KLONOPIN) 0.5 MG tablet Take 0.5-1 tablets (0.25-0.5 mg total) by mouth daily as needed for anxiety. 30 tablet 1  . FLUoxetine (PROZAC) 20 MG tablet Take 1 tablet (20 mg total) by mouth daily. 90 tablet 1  . vitamin B-12 (CYANOCOBALAMIN) 1000 MCG tablet Take 1,000 mcg by mouth daily. Reported on 04/29/2015     No current facility-administered medications on file prior to visit.     BP 134/74 (BP Location: Right Arm, Patient Position: Sitting, Cuff Size: Normal)   Pulse 67   Temp 97.9 F (36.6 C) (Oral)    Wt 185 lb (83.9 kg)   SpO2 98%   BMI 29.86 kg/m       Objective:   Physical Exam  Constitutional: She is oriented to person, place, and time. She appears well-developed and well-nourished.  Eyes: Pupils are equal, round, and reactive to light. No scleral icterus.  Neck: Neck supple.  Cardiovascular: Normal rate and regular rhythm.   Pulmonary/Chest: Effort normal and breath sounds normal. She has no wheezes. She has no rales.  Abdominal: Soft. Bowel sounds are normal. She exhibits no distension. There is no tenderness. There is no CVA tenderness.  Suprapubic tenderness to palpation present  Lymphadenopathy:    She has no cervical adenopathy.  Neurological: She is alert and oriented to person, place, and time. Coordination normal.  Skin: Skin is warm and dry. No rash noted.          Assessment & Plan:  1. Dysuria UA + trace of leukocytes; negative for blood. Symptoms and exam are reassuring; do not suspect pyelonephritis. Suspect early developing cystitis with symptoms and trace of leukocytes present. Will treat with cefdinir and culture urine. Advised patient to follow up if symptoms do not improve, worsen, or she develops a fever >100. Patient voiced understanding and agreed with plan.  - POCT urinalysis dipstick - cefdinir (OMNICEF) 300 MG capsule; Take 1 capsule (300 mg total) by mouth 2 (two) times daily.  Dispense: 14 capsule; Refill: 0   Recommended follow up with Dr. Martinique for her cpe as scheduled.  Delano Metz, FNP-C

## 2015-11-10 NOTE — Progress Notes (Signed)
Pre visit review using our clinic review tool, if applicable. No additional management support is needed unless otherwise documented below in the visit note. 

## 2015-11-10 NOTE — Patient Instructions (Signed)
Please take medication as directed.  If symptoms do not improve, worsen, or you develop new symptoms, please follow up for further evaluation and treatment.  Dysuria Dysuria is pain or discomfort while urinating. The pain or discomfort may be felt in the tube that carries urine out of the bladder (urethra) or in the surrounding tissue of the genitals. The pain may also be felt in the groin area, lower abdomen, and lower back. You may have to urinate frequently or have the sudden feeling that you have to urinate (urgency). Dysuria can affect both men and women, but is more common in women. Dysuria can be caused by many different things, including:  Urinary tract infection in women.  Infection of the kidney or bladder.  Kidney stones or bladder stones.  Certain sexually transmitted infections (STIs), such as chlamydia.  Dehydration.  Inflammation of the vagina.  Use of certain medicines.  Use of certain soaps or scented products that cause irritation. HOME CARE INSTRUCTIONS Watch your dysuria for any changes. The following actions may help to reduce any discomfort you are feeling:  Drink enough fluid to keep your urine clear or pale yellow.  Empty your bladder often. Avoid holding urine for long periods of time.  After a bowel movement or urination, women should cleanse from front to back, using each tissue only once.  Empty your bladder after sexual intercourse.  Take medicines only as directed by your health care provider.  If you were prescribed an antibiotic medicine, finish it all even if you start to feel better.  Avoid caffeine, tea, and alcohol. They can irritate the bladder and make dysuria worse. In men, alcohol may irritate the prostate.  Keep all follow-up visits as directed by your health care provider. This is important.  If you had any tests done to find the cause of dysuria, it is your responsibility to obtain your test results. Ask the lab or department  performing the test when and how you will get your results. Talk with your health care provider if you have any questions about your results. SEEK MEDICAL CARE IF:  You develop pain in your back or sides.  You have a fever.  You have nausea or vomiting.  You have blood in your urine.  You are not urinating as often as you usually do. SEEK IMMEDIATE MEDICAL CARE IF:  You pain is severe and not relieved with medicines.  You are unable to hold down any fluids.  You or someone else notices a change in your mental function.  You have a rapid heartbeat at rest.  You have shaking or chills.  You feel extremely weak.   This information is not intended to replace advice given to you by your health care provider. Make sure you discuss any questions you have with your health care provider.   Document Released: 10/14/2003 Document Revised: 02/05/2014 Document Reviewed: 09/10/2013 Elsevier Interactive Patient Education [employer].

## 2015-11-11 LAB — URINE CULTURE: ORGANISM ID, BACTERIA: NO GROWTH

## 2015-12-08 ENCOUNTER — Other Ambulatory Visit: Payer: Self-pay | Admitting: Internal Medicine

## 2015-12-08 DIAGNOSIS — Z1231 Encounter for screening mammogram for malignant neoplasm of breast: Secondary | ICD-10-CM

## 2015-12-20 ENCOUNTER — Other Ambulatory Visit: Payer: BC Managed Care – PPO

## 2015-12-21 ENCOUNTER — Other Ambulatory Visit (INDEPENDENT_AMBULATORY_CARE_PROVIDER_SITE_OTHER): Payer: BC Managed Care – PPO

## 2015-12-21 DIAGNOSIS — Z0000 Encounter for general adult medical examination without abnormal findings: Secondary | ICD-10-CM | POA: Diagnosis not present

## 2015-12-21 LAB — CBC WITH DIFFERENTIAL/PLATELET
BASOS ABS: 0 10*3/uL (ref 0.0–0.1)
BASOS PCT: 0.4 % (ref 0.0–3.0)
EOS PCT: 2.6 % (ref 0.0–5.0)
Eosinophils Absolute: 0.2 10*3/uL (ref 0.0–0.7)
HEMATOCRIT: 37 % (ref 36.0–46.0)
Hemoglobin: 12.6 g/dL (ref 12.0–15.0)
LYMPHS ABS: 2.1 10*3/uL (ref 0.7–4.0)
LYMPHS PCT: 34.1 % (ref 12.0–46.0)
MCHC: 34.2 g/dL (ref 30.0–36.0)
MCV: 88 fl (ref 78.0–100.0)
MONOS PCT: 5.2 % (ref 3.0–12.0)
Monocytes Absolute: 0.3 10*3/uL (ref 0.1–1.0)
NEUTROS ABS: 3.5 10*3/uL (ref 1.4–7.7)
NEUTROS PCT: 57.7 % (ref 43.0–77.0)
PLATELETS: 220 10*3/uL (ref 150.0–400.0)
RBC: 4.2 Mil/uL (ref 3.87–5.11)
RDW: 13.8 % (ref 11.5–15.5)
WBC: 6.1 10*3/uL (ref 4.0–10.5)

## 2015-12-21 LAB — BASIC METABOLIC PANEL
BUN: 10 mg/dL (ref 6–23)
CHLORIDE: 104 meq/L (ref 96–112)
CO2: 28 meq/L (ref 19–32)
Calcium: 9.3 mg/dL (ref 8.4–10.5)
Creatinine, Ser: 0.69 mg/dL (ref 0.40–1.20)
GFR: 91.09 mL/min (ref >60.00)
GLUCOSE: 79 mg/dL (ref 70–99)
POTASSIUM: 4.1 meq/L (ref 3.5–5.1)
SODIUM: 140 meq/L (ref 135–145)

## 2015-12-21 LAB — POC URINALSYSI DIPSTICK (AUTOMATED)
BILIRUBIN UA: NEGATIVE
Blood, UA: NEGATIVE
GLUCOSE UA: NEGATIVE
KETONES UA: NEGATIVE
NITRITE UA: NEGATIVE
Protein, UA: NEGATIVE
Spec Grav, UA: 1.015
Urobilinogen, UA: 0.2
pH, UA: 5.5

## 2015-12-21 LAB — HEPATIC FUNCTION PANEL
ALBUMIN: 4.1 g/dL (ref 3.5–5.2)
ALT: 13 U/L (ref 0–35)
AST: 16 U/L (ref 0–37)
Alkaline Phosphatase: 72 U/L (ref 39–117)
Bilirubin, Direct: 0.2 mg/dL (ref 0.0–0.3)
TOTAL PROTEIN: 6.7 g/dL (ref 6.0–8.3)
Total Bilirubin: 0.9 mg/dL (ref 0.2–1.2)

## 2015-12-21 LAB — TSH: TSH: 0.88 u[IU]/mL (ref 0.35–4.50)

## 2015-12-21 LAB — LIPID PANEL
Cholesterol: 151 mg/dL (ref 0–200)
HDL: 52.4 mg/dL (ref >39.00)
LDL Cholesterol: 80 mg/dL (ref 0–99)
NonHDL: 98.78
TRIGLYCERIDES: 95 mg/dL (ref 0.0–149.0)
Total CHOL/HDL Ratio: 3
VLDL: 19 mg/dL (ref 0.0–40.0)

## 2015-12-27 ENCOUNTER — Encounter: Payer: Self-pay | Admitting: Family Medicine

## 2015-12-27 ENCOUNTER — Ambulatory Visit (INDEPENDENT_AMBULATORY_CARE_PROVIDER_SITE_OTHER): Payer: BC Managed Care – PPO | Admitting: Family Medicine

## 2015-12-27 VITALS — BP 138/80 | HR 70 | Resp 12 | Ht 66.0 in | Wt 195.0 lb

## 2015-12-27 DIAGNOSIS — E049 Nontoxic goiter, unspecified: Secondary | ICD-10-CM | POA: Diagnosis not present

## 2015-12-27 DIAGNOSIS — F411 Generalized anxiety disorder: Secondary | ICD-10-CM | POA: Diagnosis not present

## 2015-12-27 DIAGNOSIS — Z0000 Encounter for general adult medical examination without abnormal findings: Secondary | ICD-10-CM | POA: Diagnosis not present

## 2015-12-27 DIAGNOSIS — E785 Hyperlipidemia, unspecified: Secondary | ICD-10-CM

## 2015-12-27 NOTE — Patient Instructions (Signed)
A few things to remember from today's visit:   Routine general medical examination at a health care facility    At least 150 minutes of moderate exercise per week, daily brisk walking for 15-30 min is a good exercise option. Healthy diet low in saturated (animal) fats and sweets and consisting of fresh fruits and vegetables, lean meats such as fish and white chicken and whole grains.   - Vaccines:  Tdap vaccine every 10 years.  Shingles vaccine recommended at age 13, could be given after 63 years of age but not sure about insurance coverage.  Pneumonia vaccines:  Prevnar 13 at 65 and Pneumovax at 55.  Screening recommendations for low/normal risk women:  Screening for diabetes at age 57-45 and every 3 years.  Cervical cancer prevention:  S/P hysterectomy.   -Breast cancer: Screening is recommended until 63 years old but some women can continue screening depending of healthy issues.   Colon cancer screening: starts at 63 years old until 63 years old.  Cholesterol disorder screening at age 55 and every 3 years.    Please be sure medication list is accurate. If a new problem present, please set up appointment sooner than planned today.

## 2015-12-27 NOTE — Progress Notes (Signed)
Pre visit review using our clinic review tool, if applicable. No additional management support is needed unless otherwise documented below in the visit note. 

## 2015-12-27 NOTE — Progress Notes (Signed)
HPI:   Veronica [name] is a 65 y.o. female, who is here today for her routine physical.   She is exercising regularly 3/week , at the gym. She follows a healthy diet.  She lives alone, daughter and her family live close by.  Chronic medical problems: HTN and anxiety among some. Anxiety is well controlled on Fluoxetine and Clonazepam.  She was recently in [place] and states that hot weather seems to exacerbate anxiety , Clonazepam helped some, as well as pouring water on her face.    HLD: She is currently on Lipitor 20 mg, taking 1/2 tab. She follows a low fat diet. She wonders if she needs to continue taking medication. Denies tobacco use or Hx of CVD. She is tolerating medications well, no side effects reported.   Pap smear : s/p hysterectomy due to fibroids, denies malignancy on pathology report. Hx of abnormal pap smears: 1980's, LEEP. Hx of STD's: Denies. No sexually active.   Mammogram 09/2014 [facility] 1. Colonoscopy 01/2014. DEXA: 2013 normal.  FHx for gynecologic or colon cancer negative.    No major hearing loss. She does not drive because anxiety.  Hep C screening: denies risk factor. She had lab work done recently:   Lab Results  Component Value Date   CHOL 151 12/21/2015   HDL 52.40 12/21/2015   LDLCALC 80 12/21/2015   LDLDIRECT 161.6 06/03/2012   TRIG 95.0 12/21/2015   CHOLHDL 3 12/21/2015   Lab Results  Component Value Date   TSH 0.88 12/21/2015   Lab Results  Component Value Date   ALT 13 12/21/2015   AST 16 12/21/2015   ALKPHOS 72 12/21/2015   BILITOT 0.9 12/21/2015   Lab Results  Component Value Date   CREATININE 0.69 12/21/2015   BUN 10 12/21/2015   NA 140 12/21/2015   K 4.1 12/21/2015   CL 104 12/21/2015   CO2 28 12/21/2015   Lab Results  Component Value Date   WBC 6.1 12/21/2015   HGB 12.6 12/21/2015   HCT 37.0 12/21/2015   MCV 88.0 12/21/2015   PLT 220.0 12/21/2015      Review of Systems  Constitutional:  Negative for appetite change, fatigue, fever and unexpected weight change.  HENT: Negative for dental problem, mouth sores, nosebleeds, trouble swallowing and voice change.   Eyes: Negative for pain and visual disturbance.  Respiratory: Negative for cough, shortness of breath and wheezing.   Cardiovascular: Negative for chest pain and leg swelling.  Gastrointestinal: Negative for abdominal pain, blood in stool, nausea and vomiting.       No changes in bowel habits.  Endocrine: Negative for cold intolerance, heat intolerance, polydipsia, polyphagia and polyuria.  Genitourinary: Negative for decreased urine volume, difficulty urinating, dysuria, hematuria, vaginal bleeding and vaginal discharge.       No breast tenderness or nipple discharge.  Musculoskeletal: Positive for arthralgias. Negative for gait problem and neck pain.  Skin: Negative for color change and rash.  Neurological: Negative for syncope, weakness and headaches.  Hematological: Negative for adenopathy. Does not bruise/bleed easily.  Psychiatric/Behavioral: Negative for confusion, hallucinations and sleep disturbance. The patient is nervous/anxious.   All other systems reviewed and are negative.     Current Outpatient Prescriptions on File Prior to Visit  Medication Sig Dispense Refill  . amLODipine (NORVASC) 5 MG tablet Take 1 tablet (5 mg total) by mouth every morning. 90 tablet 2  . atorvastatin (LIPITOR) 20 MG tablet Take 1 tablet (20 mg total) by  mouth at bedtime. 90 tablet 0  . CASCARA SAGRADA PO Take 3 capsules by mouth every other day. Reported on 04/29/2015    . FLUoxetine (PROZAC) 20 MG tablet Take 1 tablet (20 mg total) by mouth daily. 90 tablet 1  . vitamin B-12 (CYANOCOBALAMIN) 1000 MCG tablet Take 1,000 mcg by mouth daily. Reported on 04/29/2015     No current facility-administered medications on file prior to visit.      Past Medical History:  Diagnosis Date  . Allergic rhinitis   . Anxiety   . Anxiety  and depression   . Constipation, chronic   . Coronary artery disease    06-2010, had CP----> Mild nonobstructive CAD on cardiac CT   . Hemorrhoids   . Hip pain, bilateral   . Hyperlipidemia   . Hypertension   . Leukoplakia of vagina   . Low back pain    w/ left side radiculopathy, s/p injection at L4-L5 for[facility]en 1/06  . Uterine fibroid     No Known Allergies  Family History  Problem Relation Age of Onset  . Schizophrenia Son   . Schizophrenia Brother   . Schizophrenia Other     2 nieces  . Diabetes Neg Hx   . Heart attack Neg Hx   . Colon cancer Neg Hx   . Breast cancer Neg Hx     Social History   Social History  . Marital status: Divorced    Spouse name: N/A  . Number of children: 3  . Years of education: N/A   Occupational History  . 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  [employer]   Social History Main Topics  . Smoking status: Never Smoker  . Smokeless tobacco: Never Used  . Alcohol use Yes     Comment: rarely  . Drug use: No  . Sexual activity: No   Other Topics Concern  . None   Social History Narrative   Divorced, lives w/ 1 of her children   Original from [place], living in [place] since age 75                       Vitals:   12/27/15 0902  BP: 138/80  Pulse: 70  Resp: 12   Body mass index is 31.47 kg/m.  O2 sat 98% at RA.   Wt Readings from Last 3 Encounters:  11/10/15 185 lb (83.9 kg)  10/26/15 185 lb (83.9 kg)  08/26/15 185 lb 2 oz (84 kg)    Physical Exam  Nursing note and vitals reviewed. Constitutional: She is oriented to person, place, and time. She appears well-developed. No distress.  HENT:  Head: Atraumatic.  Right Ear: Hearing, tympanic membrane, external ear and ear canal normal.  Left Ear: Hearing, tympanic membrane, external ear and ear canal normal.  Mouth/Throat: Uvula is midline, oropharynx is clear and moist and mucous membranes are normal.  Eyes: Conjunctivae and EOM are normal. Pupils are  equal, round, and reactive to light.  Neck: No JVD present. Thyromegaly (mild) present.  Cardiovascular: Normal rate and regular rhythm.   No murmur heard. Pulses:      Dorsalis pedis pulses are 2+ on the right side, and 2+ on the left side.       Posterior tibial pulses are 2+ on the right side, and 2+ on the left side.  Respiratory: Effort normal and breath sounds normal. No respiratory distress.  GI: Soft. She exhibits no mass. There is no  hepatomegaly. There is no tenderness.  Genitourinary: No breast swelling, tenderness or discharge.  Genitourinary Comments: Mild fibrocystic changes , L>R. No breast masses bilateral or nipple discharge.  Musculoskeletal: She exhibits no edema.  No major deformity or signs of synovitis appreciated.  Lymphadenopathy:    She has no cervical adenopathy.    She has no axillary adenopathy.       Right: No supraclavicular adenopathy present.       Left: No supraclavicular adenopathy present.  Neurological: She is alert and oriented to person, place, and time. She has normal strength. No cranial nerve deficit. Coordination and gait normal.  Reflex Scores:      Bicep reflexes are 2+ on the right side and 2+ on the left side.      Patellar reflexes are 2+ on the right side and 2+ on the left side. Skin: Skin is warm. No rash noted. No erythema.  Psychiatric: Her speech is normal. Her mood appears anxious. Cognition and memory are normal.  Well groomed, good eye contact.      ASSESSMENT AND PLAN:      Lesa was seen today for annual exam.  Diagnoses and all orders for this visit:    Routine general medical examination at a health care facility  We discussed the importance of regular physical activity and healthy diet for prevention of chronic illness and/or complications. Preventive guidelines reviewed. Vaccination up to date.  Ca++ and vit D supplementation recommended. Next CPE in 1-2 years.    Enlarged thyroid gland  Further  recommendations will be given after imaging report. Recent TSH in normal range.  -     US Soft Tissue Head/Neck; Future  Generalized anxiety disorder  Stable otherwise. No changes in current management.   Dyslipidemia  She could stopped Lipitor and continue low fat diet. We can re-check FLP in 5-6 months. We discussed benefits and side effects of stain medications.    -I will see her back in 6 months to follow on some of her chronic medical problems.      Return in 6 months (on 06/25/2016) for HTN,anxiety.          Keondre Markson G. Martinique, MD  [facility] [place] office.

## 2016-01-05 ENCOUNTER — Other Ambulatory Visit: Payer: BC Managed Care – PPO

## 2016-01-12 ENCOUNTER — Ambulatory Visit
Admission: RE | Admit: 2016-01-12 | Discharge: 2016-01-12 | Disposition: A | Discharge status: Home / Self Care | Payer: BC Managed Care – PPO | Source: Ambulatory Visit | Attending: Family Medicine | Admitting: Family Medicine

## 2016-01-12 DIAGNOSIS — E049 Nontoxic goiter, unspecified: Secondary | ICD-10-CM

## 2016-01-13 ENCOUNTER — Encounter: Payer: Self-pay | Admitting: Family Medicine

## 2016-01-13 ENCOUNTER — Ambulatory Visit
Admission: RE | Admit: 2016-01-13 | Discharge: 2016-01-13 | Disposition: A | Discharge status: Home / Self Care | Payer: BC Managed Care – PPO | Source: Ambulatory Visit | Attending: Internal Medicine | Admitting: Internal Medicine

## 2016-01-13 DIAGNOSIS — Z1231 Encounter for screening mammogram for malignant neoplasm of breast: Secondary | ICD-10-CM

## 2016-01-17 ENCOUNTER — Other Ambulatory Visit: Payer: Self-pay

## 2016-01-17 DIAGNOSIS — E041 Nontoxic single thyroid nodule: Secondary | ICD-10-CM

## 2016-01-31 NOTE — Telephone Encounter (Signed)
Pt is calling back to discuss thyroid report and to discuss bx.

## 2016-02-01 NOTE — Telephone Encounter (Signed)
Called patient back and spoke with her. She has already received all the of the information she needed for her referral. Nothing further is needed at this time.

## 2016-02-23 ENCOUNTER — Telehealth: Payer: Self-pay | Admitting: Family Medicine

## 2016-02-23 ENCOUNTER — Other Ambulatory Visit: Payer: Self-pay | Admitting: Surgery

## 2016-02-23 DIAGNOSIS — E041 Nontoxic single thyroid nodule: Secondary | ICD-10-CM

## 2016-02-23 NOTE — Telephone Encounter (Signed)
Pt would like a call back to discuss the her biopsy of her thyroid and other  Issues going on. Declined appt at this time.Marland Kitchen

## 2016-02-24 NOTE — Telephone Encounter (Signed)
Called and spoke with patient. Advised about message below, and she said that the the pain has to do with her stomach and it mainly occurs after she eats. Advised patient if she has any chest pain, palpitations, or feeling like she cannot breathe, that she needs to go to the ER. Patient verbalized understanding & will keep appointment for Wednesday.  Patient would like a referral to endocrinology to see Dr. Delrae Rend.

## 2016-02-24 NOTE — Telephone Encounter (Signed)
Called and spoke with patient. She has been having some abdominal pain on & off as well as some trouble breathing. She said that it is not anything major, but wants to get it checked out. The surgeon told her it didn't not have to do with her thyroid. She is wanting to know if she needs to see a endocrinologist. Patient does have an appointment for next Wednesday at 11am, and I advised that we would probably talk about the solutions then. Patient verbalized understanding, and I also advised her that if you had any ideas on what could help her that I would relay those back to her.

## 2016-02-24 NOTE — Telephone Encounter (Signed)
These symptoms I do not think are related to thyroid. Enlarged thyroid was an incidental finding and thyroid function was in normal range. "Some abdominal pain" does not seem to be serious, if worse or associated fever or vomiting she needs to seek immediate medical attention. "Some trouble breathing" can be serious (heart or lungs) or related to anxiety,congestion among some. If she can be here sooner it is advisable but if she prefers to wait until Wednesday she needs to monitor for associated chest pain, palpitations, or sudden worsening.  Thanks, BJ

## 2016-02-29 ENCOUNTER — Encounter: Payer: Self-pay | Admitting: Family Medicine

## 2016-02-29 ENCOUNTER — Ambulatory Visit (INDEPENDENT_AMBULATORY_CARE_PROVIDER_SITE_OTHER): Payer: BC Managed Care – PPO | Admitting: Family Medicine

## 2016-02-29 VITALS — BP 122/80 | HR 81 | Resp 12 | Ht 66.0 in | Wt 197.1 lb

## 2016-02-29 DIAGNOSIS — R0600 Dyspnea, unspecified: Secondary | ICD-10-CM | POA: Diagnosis not present

## 2016-02-29 DIAGNOSIS — F411 Generalized anxiety disorder: Secondary | ICD-10-CM | POA: Diagnosis not present

## 2016-02-29 DIAGNOSIS — E041 Nontoxic single thyroid nodule: Secondary | ICD-10-CM

## 2016-02-29 DIAGNOSIS — R1013 Epigastric pain: Secondary | ICD-10-CM | POA: Diagnosis not present

## 2016-02-29 DIAGNOSIS — K219 Gastro-esophageal reflux disease without esophagitis: Secondary | ICD-10-CM

## 2016-02-29 MED ORDER — OMEPRAZOLE 40 MG PO CPDR: 40.0000 mg | DELAYED_RELEASE_CAPSULE | Freq: Every day | ORAL | 0 refills | Status: DC

## 2016-02-29 NOTE — Progress Notes (Signed)
HPI:  ACUTE VISIT:  Chief Complaint  Patient presents with  . Abdominal Pain    [name] is a 66 y.o. female, who is here today with multiple symptoms: Shortness of breath, palpitations, heat intolerance and dizziness among some. She feels like "something is wrong with my body."  Last office visit during her routine physical incidentally I noted enlarged thyroid gland. Thyroid U/S showed some nodules but one met the criteria for Bx. She already met with surgeon and tomorrow she has appt for Bx.   She states that symptoms are not new, she has been having them for a while but in now she "understands why." She attributes all these symptoms to thyroid nodule and she is requesting a referral to endocrinologist, she would like to see Veronica [name].  For a while she has been trying to lose weight but has not been able to do so at the pace she would like to. She has noticed some weight loss with a healthy diet and regular exercise.  He is also concerned about the fact that she has poor tolerance to hot weather, which she has mentioned before when she travelled to [place] and [place]. She feels like she cannot breath, feels "anxious", alleviated with trying to "calm" herself down and by pouring water on her face. He denies tremor or diarrhea.    Dyspnea: He has been intermittently for years, "lack of O2", it seems to be getting worse, she denies associated chest pain or diaphoresis. She is exercising regularly, she has noted that she developed dyspnea and she is exercising and associated to dizziness.  She also has intermittent palpitations but not associated with dyspnea or dizziness. No smoker. Denies fever,chills, or wheezing. She has occasionally associated cough when she is exercising. Denies orthopnea or PND.  + Fatigue.  Lab Results  Component Value Date   WBC 6.1 12/21/2015   HGB 12.6 12/21/2015   HCT 37.0 12/21/2015   MCV 88.0 12/21/2015   PLT 220.0 12/21/2015   Lab  Results  Component Value Date   TSH 0.88 12/21/2015    11/08/10 Normal nuclear stress test.    Coronary CT 2012: 1.  Mild nonobstructive coronary artery disease.  The patient's total coronary artery calcium score is 12.62, which is 75th percentile for patient's matched age and gender. 2.  Nonobstructive calcified plaques at the origin of the LAD, the proximal LAD, and in the midportion of a large first diagonal/ramus branch. 3.  Right coronary artery dominance. 4.  Focal area of scarring and bronchiectasis medially in the right lower lobe.  No other significant non-cardiac findings.   09/2010 Chest CTA: Chest pain and SOB  1. No filling defect is identified in the pulmonary arterial tree to suggest pulmonary embolus. 2.  Linear subsegmental atelectasis in the lingula.   Abdominal pain: For years she has had intermittently epigastric abdominal pain, dull, no radiated, exacerbated by food intake. Usually it resolves spontaneously. Easy satiety. She has tried to avoid dairy products because they seem to aggravate problem but she is still having problems.  She denies dysphagia but is reporting some throat discomfort, pressure sensation, she points to anterior aspect of neck.  She has not noted heartburn, nausea, vomiting, changes in bowel habits, blood in the stools, or melena.  Colonoscopy 2016, 5 years f/u recommended.    Chemistry      Component Value Date/Time   NA 140 12/21/2015 0849   K 4.1 12/21/2015 0849   CL 104  12/21/2015 0849   CO2 28 12/21/2015 0849   BUN 10 12/21/2015 0849   CREATININE 0.69 12/21/2015 0849   CREATININE 0.77 10/24/2014 1654      Component Value Date/Time   CALCIUM 9.3 12/21/2015 0849   ALKPHOS 72 12/21/2015 0849   AST 16 12/21/2015 0849   ALT 13 12/21/2015 0849   BILITOT 0.9 12/21/2015 0849      She has history of anxiety disorder, she tells me that she has already stopped Clonazepam and currently she is taking fluoxetine as needed (since  12/2015). She denies any side effect from medications. She states that she was feeling "good", so she decided to start weaning off her medications. She denies suicidal thoughts.   Review of Systems  Constitutional: Positive for fatigue. Negative for appetite change, chills and fever.  HENT: Positive for postnasal drip. Negative for mouth sores, nosebleeds, sore throat, trouble swallowing and voice change.   Eyes: Negative for pain and visual disturbance.  Respiratory: Positive for cough and shortness of breath. Negative for wheezing.   Cardiovascular: Negative for chest pain and palpitations.  Gastrointestinal: Positive for abdominal pain. Negative for blood in stool, nausea and vomiting.  Endocrine: Positive for heat intolerance. Negative for cold intolerance, polydipsia, polyphagia and polyuria.  Genitourinary: Negative for frequency, hematuria and urgency.  Musculoskeletal: Positive for arthralgias. Negative for back pain, gait problem and myalgias.  Skin: Negative for rash.  Neurological: Negative for syncope, weakness and headaches.  Hematological: Negative for adenopathy. Does not bruise/bleed easily.  Psychiatric/Behavioral: Negative for confusion. The patient is nervous/anxious.       Current Outpatient Prescriptions on File Prior to Visit  Medication Sig Dispense Refill  . amLODipine (NORVASC) 5 MG tablet Take 1 tablet (5 mg total) by mouth every morning. 90 tablet 2  . atorvastatin (LIPITOR) 20 MG tablet Take 1 tablet (20 mg total) by mouth at bedtime. 90 tablet 0  . CASCARA SAGRADA PO Take 3 capsules by mouth every other day. Reported on 04/29/2015    . FLUoxetine (PROZAC) 20 MG tablet Take 1 tablet (20 mg total) by mouth daily. 90 tablet 1  . vitamin B-12 (CYANOCOBALAMIN) 1000 MCG tablet Take 1,000 mcg by mouth daily. Reported on 04/29/2015     No current facility-administered medications on file prior to visit.      Past Medical History:  Diagnosis Date  . Allergic  rhinitis   . Anxiety   . Anxiety and depression   . Constipation, chronic   . Coronary artery disease    06-2010, had CP----> Mild nonobstructive CAD on cardiac CT   . Hemorrhoids   . Hip pain, bilateral   . Hyperlipidemia   . Hypertension   . Leukoplakia of vagina   . Low back pain    w/ left side radiculopathy, s/p injection at L4-L5 forarmen 1/06  . Uterine fibroid    No Known Allergies  Social History   Social History  . Marital status: Divorced    Spouse name: N/A  . Number of children: 3  . Years of education: N/A   Occupational History  . 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  [employer]   Social History Main Topics  . Smoking status: Never Smoker  . Smokeless tobacco: Never Used  . Alcohol use Yes     Comment: rarely  . Drug use: No  . Sexual activity: No   Other Topics Concern  . None   Social History Narrative   Divorced, lives w/  1 of her children   Original from [place], living in [place] since age 18                      Vitals:   02/29/16 1128  BP: 122/80  Pulse: 81  Resp: 12   O2 sat 97% at RA.  Body mass index is 31.82 kg/m.   Physical Exam  Nursing note and vitals reviewed. Constitutional: She is oriented to person, place, and time. She appears well-developed. No distress.  HENT:  Head: Atraumatic.  Mouth/Throat: Oropharynx is clear and moist and mucous membranes are normal.  Eyes: Conjunctivae and EOM are normal. Pupils are equal, round, and reactive to light.  Neck: No tracheal deviation present. Thyromegaly (R>L) present.  Cardiovascular: Normal rate and regular rhythm.   No murmur heard. Pulses:      Dorsalis pedis pulses are 2+ on the right side, and 2+ on the left side.  Respiratory: Effort normal and breath sounds normal. No respiratory distress.  GI: Soft. Bowel sounds are normal. She exhibits no mass. There is no hepatomegaly. There is tenderness (mild) in the epigastric area. There is no rigidity, no  rebound, no guarding and no CVA tenderness.  Musculoskeletal: She exhibits no edema or tenderness.  Lymphadenopathy:    She has no cervical adenopathy.       Right: No supraclavicular adenopathy present.       Left: No supraclavicular adenopathy present.  Neurological: She is alert and oriented to person, place, and time. She has normal strength. Coordination normal.  Skin: Skin is warm. No rash noted. No erythema.  Psychiatric: Her mood appears anxious. Her affect is labile. Cognition and memory are normal. She expresses no suicidal ideation.  Well groomed, good eye contact.      ASSESSMENT AND PLAN:   Danaisha was seen today for abdominal pain.  Diagnoses and all orders for this visit:   Epigastric abdominal pain  We discussed possible causes, he seems to be chronic. Instructed about warning signs. Avoid food that triggers symptoms. I don't think lab work is needed at this time since she has had normal blood work in 11/2015.  Gastroesophageal reflux disease, esophagitis presence not specified  GERD precautions recommended. She agrees with trying omeprazole. Follow-up in 3-4 weeks.  -     omeprazole (PRILOSEC) 40 MG capsule; Take 1 capsule (40 mg total) by mouth daily.  Dyspnea, unspecified type  This also seems to be chronic. Lung examination today negative. Further recommendations would be given according to CXR results.  -     DG Chest 2 View; Future  Thyroid nodule  We discussed diagnosis, explained that I don't think all her symptoms are related to this problem. As requested referral to endocrinologist was placed to see Dr Buddy [name]. Tomorrow she is having thyroid Bx.  -     Ambulatory referral to Endocrinology  Generalized anxiety disorder  I think many of her symptoms are related to anxiety. She has had some workup in the past due to similar concerns and has been otherwise negative. I recommend starting Fluoxetine daily. Instructed about warning  signs. Follow-up in 3-4 weeks.    Return in about 4 weeks (around 03/28/2016) for abdominal pain.   -[name] was advised to return or notify a doctor immediately if symptoms worsen or new concerns arise.       Lynda Capistran G. Martinique, MD  [facility]. [place] office.

## 2016-02-29 NOTE — Patient Instructions (Addendum)
A few things to remember from today's visit:   Thyroid nodule - Plan: Ambulatory referral to Endocrinology  Epigastric abdominal pain - Plan: omeprazole (PRILOSEC) 40 MG capsule   Avoid foods that make your symptoms worse, for example coffee, chocolate,pepermeint,alcohol, and greasy food. Raising the head of your bed about 6 inches may help with nocturnal symptoms.  Avoid tobacco use. Weight loss (if you are overweight). Avoid lying down for 3 hours after eating.  Instead 3 large meals daily try small and more frequent meals during the day.  Every medication have side effects and medications for GERD are not the exception.At this time I think benefit is greater than risk.  There has been some concerns about dementia and medications like Omeprazole or Nexium (PPI) but recent studies do not show a relation. Also kidney function can be affected among some patients that take these type of medications, we will follow accordingly. Taking these medications for long term could increase risk of osteoporosis (some debate now), vitamin deficiencies (Vit D and B12 specialty), increases risk of pneumonia.     If symptoms are not resolved sometimes endoscopy is necessary.  Resume Fluoxetine daily.    Please be sure medication list is accurate. If a new problem present, please set up appointment sooner than planned today.

## 2016-02-29 NOTE — Progress Notes (Signed)
Pre visit review using our clinic review tool, if applicable. No additional management support is needed unless otherwise documented below in the visit note. 

## 2016-03-01 ENCOUNTER — Ambulatory Visit
Admission: RE | Admit: 2016-03-01 | Discharge: 2016-03-01 | Disposition: A | Discharge status: Home / Self Care | Payer: BC Managed Care – PPO | Source: Ambulatory Visit | Attending: Surgery | Admitting: Surgery

## 2016-03-01 ENCOUNTER — Other Ambulatory Visit (HOSPITAL_COMMUNITY)
Admission: RE | Admit: 2016-03-01 | Discharge: 2016-03-01 | Disposition: A | Discharge status: Home / Self Care | Payer: BC Managed Care – PPO | Source: Ambulatory Visit | Attending: Radiology | Admitting: Radiology

## 2016-03-01 DIAGNOSIS — E041 Nontoxic single thyroid nodule: Secondary | ICD-10-CM | POA: Insufficient documentation

## 2016-03-01 NOTE — Procedures (Signed)
PROCEDURE SUMMARY:  Using direct ultrasound guidance, 5 passes were made using 25 g needles into the nodule within the right lobe of the thyroid.   Ultrasound was used to confirm needle placements on all occasions.   Specimens were sent to Pathology for analysis.  Jessalynn Mccowan S Phyllis Abelson PA-C 03/01/2016 3:09 PM

## 2016-03-05 ENCOUNTER — Ambulatory Visit (INDEPENDENT_AMBULATORY_CARE_PROVIDER_SITE_OTHER)
Admission: RE | Admit: 2016-03-05 | Discharge: 2016-03-05 | Disposition: A | Discharge status: Home / Self Care | Payer: BC Managed Care – PPO | Source: Ambulatory Visit | Attending: Family Medicine | Admitting: Family Medicine

## 2016-03-05 ENCOUNTER — Encounter: Payer: Self-pay | Admitting: Family Medicine

## 2016-03-05 DIAGNOSIS — R0600 Dyspnea, unspecified: Secondary | ICD-10-CM | POA: Diagnosis not present

## 2016-04-24 ENCOUNTER — Other Ambulatory Visit: Payer: Self-pay | Admitting: Internal Medicine

## 2016-04-24 DIAGNOSIS — E042 Nontoxic multinodular goiter: Secondary | ICD-10-CM

## 2016-04-30 ENCOUNTER — Ambulatory Visit
Admission: RE | Admit: 2016-04-30 | Discharge: 2016-04-30 | Disposition: A | Discharge status: Home / Self Care | Payer: BC Managed Care – PPO | Source: Ambulatory Visit | Attending: Internal Medicine | Admitting: Internal Medicine

## 2016-04-30 DIAGNOSIS — E042 Nontoxic multinodular goiter: Secondary | ICD-10-CM

## 2016-05-14 ENCOUNTER — Ambulatory Visit (INDEPENDENT_AMBULATORY_CARE_PROVIDER_SITE_OTHER): Payer: BC Managed Care – PPO | Admitting: Family Medicine

## 2016-05-14 ENCOUNTER — Encounter: Payer: Self-pay | Admitting: Family Medicine

## 2016-05-14 VITALS — BP 130/64 | HR 94 | Temp 99.3°F | Resp 12 | Ht 66.0 in | Wt 198.4 lb

## 2016-05-14 DIAGNOSIS — J069 Acute upper respiratory infection, unspecified: Secondary | ICD-10-CM | POA: Diagnosis not present

## 2016-05-14 DIAGNOSIS — J309 Allergic rhinitis, unspecified: Secondary | ICD-10-CM

## 2016-05-14 LAB — POC INFLUENZA A&B (BINAX/QUICKVUE)
Influenza A, POC: NEGATIVE
Influenza B, POC: NEGATIVE

## 2016-05-14 MED ORDER — BENZONATATE 100 MG PO CAPS
200.0000 mg | ORAL_CAPSULE | Freq: Two times a day (BID) | ORAL | 0 refills | Status: AC | PRN
Start: 2016-05-14 — End: 2016-05-24

## 2016-05-14 MED ORDER — FLUTICASONE PROPIONATE 50 MCG/ACT NA SUSP: 1.0000 | Freq: Two times a day (BID) | NASAL | 3 refills | Status: DC

## 2016-05-14 MED ORDER — FEXOFENADINE HCL 180 MG PO TABS: 180.0000 mg | ORAL_TABLET | Freq: Every day | ORAL | 3 refills | Status: DC

## 2016-05-14 NOTE — Patient Instructions (Addendum)
A few things to remember from today's visit:   URI, acute - Plan: benzonatate (TESSALON) 100 MG capsule, POC Influenza A&B(BINAX/QUICKVUE)  Allergic rhinitis, unspecified seasonality, unspecified trigger - Plan: fluticasone (FLONASE) 50 MCG/ACT nasal spray, fexofenadine (ALLEGRA ALLERGY) 180 MG tablet  viral infections are self-limited and we treat each symptom depending of severity.  Over the counter medications as decongestants and cold medications usually help, they need to be taken with caution if there is a history of high blood pressure or palpitations. Tylenol and/or Ibuprofen also helps with most symptoms (headache, muscle aching, fever,etc) Plenty of fluids. Honey helps with cough. Steam inhalations helps with runny nose, nasal congestion, and may prevent sinus infections. Cough and nasal congestion could last a few days and sometimes weeks. Please follow in not any better in 10-14 days  Or if symptoms get worse.  Monitor for new symptoms.    Please be sure medication list is accurate. If a new problem present, please set up appointment sooner than planned today.

## 2016-05-14 NOTE — Progress Notes (Signed)
Pre visit review using our clinic review tool, if applicable. No additional management support is needed unless otherwise documented below in the visit note. 

## 2016-05-14 NOTE — Progress Notes (Signed)
HPI:  ACUTE VISIT  Chief Complaint  Patient presents with  . Headache  . Chills    [name] is a 66 y.o.female here today complaining of respiratory symptoms since yesterday. She started with sore throat, body aches,and last night chills. Subjective fever and frontal/retro ocular headache last night. She took Tylenol which helped. Symptoms started after attending church, attributed symptoms initially to dusty carpet.   Headache   This is a new problem. The current episode started yesterday. The problem has been gradually improving. The pain is located in the frontal region. The pain quality is not similar to prior headaches. The pain is at a severity of 6/10. Associated symptoms include coughing, drainage, eye watering, a fever, muscle aches, rhinorrhea, sinus pressure and a sore throat. Pertinent negatives include no abdominal pain, ear pain, eye redness, hearing loss, nausea, neck pain, swollen glands, visual change, vomiting or weakness. She has tried acetaminophen for the symptoms. The treatment provided significant relief. Her past medical history is significant for hypertension.  URI   This is a new problem. The current episode started yesterday. The problem has been unchanged. The fever has been present for less than 1 day. Associated symptoms include congestion, coughing, headaches, rhinorrhea and a sore throat. Pertinent negatives include no abdominal pain, chest pain, diarrhea, dysuria, ear pain, nausea, neck pain, rash, sneezing, swollen glands, vomiting or wheezing. She has tried acetaminophen for the symptoms. The treatment provided mild relief.    Non productive cough. + Nasal congestion, rhinorrhea,and post nasal drainage.  She has not noted chest pain, dyspnea, or wheezing.  No Hx of recent travel. Sick contact: Daughter had a cold last week. No known insect bite.  Hx of allergies: Yes. She just started Allegra yesterday, she would like a Rx for  [facility] and Allegra.  OTC medications for this problem: Tumeric tea and lemon with honey. No analgesic or antipyretic today.  Symptoms otherwise stable.   Review of Systems  Constitutional: Positive for activity change, appetite change, chills, fatigue and fever.  HENT: Positive for congestion, postnasal drip, rhinorrhea, sinus pressure and sore throat. Negative for ear pain, hearing loss, mouth sores, nosebleeds, sneezing, trouble swallowing and voice change.   Eyes: Negative for discharge, redness and visual disturbance.  Respiratory: Positive for cough. Negative for shortness of breath and wheezing.   Cardiovascular: Negative for chest pain and leg swelling.  Gastrointestinal: Negative for abdominal pain, diarrhea, nausea and vomiting.  Genitourinary: Negative for decreased urine volume, dysuria and hematuria.  Musculoskeletal: Positive for myalgias. Negative for neck pain.  Skin: Negative for rash.  Allergic/Immunologic: Positive for environmental allergies.  Neurological: Positive for headaches. Negative for syncope and weakness.  Hematological: Negative for adenopathy. Does not bruise/bleed easily.  Psychiatric/Behavioral: Negative for confusion. The patient is nervous/anxious.       Current Outpatient Prescriptions on File Prior to Visit  Medication Sig Dispense Refill  . amLODipine (NORVASC) 5 MG tablet Take 1 tablet (5 mg total) by mouth every morning. 90 tablet 2  . CASCARA SAGRADA PO Take 3 capsules by mouth every other day. Reported on 04/29/2015    . FLUoxetine (PROZAC) 20 MG tablet Take 1 tablet (20 mg total) by mouth daily. 90 tablet 1  . vitamin B-12 (CYANOCOBALAMIN) 1000 MCG tablet Take 1,000 mcg by mouth daily. Reported on 04/29/2015     No current facility-administered medications on file prior to visit.      Past Medical History:  Diagnosis Date  . Allergic  rhinitis   . Anxiety   . Anxiety and depression   . Constipation, chronic   . Coronary artery disease     06-2010, had CP----> Mild nonobstructive CAD on cardiac CT   . Hemorrhoids   . Hip pain, bilateral   . Hyperlipidemia   . Hypertension   . Leukoplakia of vagina   . Low back pain    w/ left side radiculopathy, s/p injection at L4-L5 forarmen 1/06  . Uterine fibroid    No Known Allergies  Social History   Social History  . Marital status: Divorced    Spouse name: N/A  . Number of children: 3  . Years of education: N/A   Occupational History  . 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  [employer]   Social History Main Topics  . Smoking status: Never Smoker  . Smokeless tobacco: Never Used  . Alcohol use Yes     Comment: rarely  . Drug use: No  . Sexual activity: No   Other Topics Concern  . None   Social History Narrative   Divorced, lives w/ 1 of her children   Original from [place], living in [place] since age 51                      Vitals:   05/14/16 1020  BP: 130/64  Pulse: 94  Resp: 12  Temp: 99.3 F (37.4 C)  O2 sat at RA 98%. Body mass index is 32.02 kg/m.  Physical Exam  Nursing note and vitals reviewed. Constitutional: She is oriented to person, place, and time. She appears well-developed. No distress.  HENT:  Head: Atraumatic.  Right Ear: Tympanic membrane, external ear and ear canal normal.  Left Ear: Tympanic membrane, external ear and ear canal normal.  Nose: Rhinorrhea present. Right sinus exhibits no maxillary sinus tenderness and no frontal sinus tenderness. Left sinus exhibits no maxillary sinus tenderness and no frontal sinus tenderness.  Mouth/Throat: Uvula is midline and mucous membranes are normal. Posterior oropharyngeal erythema (mild) present. No oropharyngeal exudate or posterior oropharyngeal edema.  Hypertrophic turbinates.  Eyes: Conjunctivae and EOM are normal.  Neck: No muscular tenderness present.  Cardiovascular: Normal rate and regular rhythm.   No murmur heard. Respiratory: Effort normal and breath sounds  normal. No stridor. No respiratory distress.  Lymphadenopathy:       Head (right side): No submandibular adenopathy present.       Head (left side): No submandibular adenopathy present.    She has no cervical adenopathy.  Neurological: She is alert and oriented to person, place, and time. She has normal strength. No cranial nerve deficit. Coordination and gait normal.  Skin: Skin is warm. No rash noted. No erythema.  Psychiatric: Her speech is normal. Her mood appears anxious.  Fairly groomed, good eye contact.    ASSESSMENT AND PLAN:   Cidney was seen today for headache and chills.  Diagnoses and all orders for this visit:  URI, acute  Symptoms suggests a viral etiology, I explained patient that symptomatic treatment is usually recommended in this case, so I do not think abx is needed at this time. Instructed to monitor for signs of complications,clearly instructed about warning signs. I also explained that cough and nasal congestion can last a few days and sometimes weeks. F/U as needed.   -     benzonatate (TESSALON) 100 MG capsule; Take 2 capsules (200 mg total) by mouth 2 (two) times daily as needed  for cough. -     POC Influenza A&B(BINAX/QUICKVUE)  Allergic rhinitis, unspecified seasonality, unspecified trigger  Some side effects of intranasal sterid discuss, instructed to avoid spraying directly on septum. Nasal irrigations with saline. Allegra 180 mg daily.  -     fluticasone (FLONASE) 50 MCG/ACT nasal spray; Place 1 spray into both nostrils 2 (two) times daily. -     fexofenadine (ALLEGRA ALLERGY) 180 MG tablet; Take 1 tablet (180 mg total) by mouth daily.     -Ms. BRENDALYN VALLELY advised to return or notify a doctor immediately if symptoms worsen or persist or new concerns arise.       Denine Brotz G. Martinique, MD  [facility]. [place] office.

## 2016-05-21 ENCOUNTER — Encounter: Payer: Self-pay | Admitting: Family Medicine

## 2016-05-21 ENCOUNTER — Ambulatory Visit (INDEPENDENT_AMBULATORY_CARE_PROVIDER_SITE_OTHER): Payer: BC Managed Care – PPO | Admitting: Family Medicine

## 2016-05-21 VITALS — BP 124/72 | HR 72 | Temp 98.3°F | Ht 66.0 in | Wt 198.2 lb

## 2016-05-21 DIAGNOSIS — R202 Paresthesia of skin: Secondary | ICD-10-CM | POA: Diagnosis not present

## 2016-05-21 DIAGNOSIS — M5432 Sciatica, left side: Secondary | ICD-10-CM

## 2016-05-21 MED ORDER — PREDNISONE 10 MG PO TABS: ORAL_TABLET | ORAL | 0 refills | Status: DC

## 2016-05-21 NOTE — Patient Instructions (Signed)
BEFORE YOU LEAVE: -follow up: with CP in 2-4 weeks -labs -sciatica back exercises  Call to set up follow up with your back specialist.  Take the prednisone as instructed.  Do the back exercises per instructions 3-4 days per week.  Heat, Topical menthol (tiger balm) and tylenol per instructions can help with pain as needed.  I hope you are feeling better soon! Seek care immediately if worsening, new concerns or you are not improving with treatment.  We have ordered labs or studies at this visit. It can take up to 1-2 weeks for results and processing. IF results require follow up or explanation, we will call you with instructions. Clinically stable results will be released to your [facility]. If you have not heard from [place] or cannot find your results in [facility] in 2 weeks please contact our office at [phone].  If you are not yet signed up for [facility], please consider signing up.

## 2016-05-21 NOTE — Progress Notes (Signed)
Pre visit review using our clinic review tool, if applicable. No additional management support is needed unless otherwise documented below in the visit note. 

## 2016-05-21 NOTE — Progress Notes (Signed)
HPI:  Note: patient showed up 30 minutes late, she thought appt was at a different time so we did work her in over lunch.  Acute visit for:  L leg paresthesia: -chronic, intermittent -flare the last 2 weeks -symptoms: pain/paresthesias with certain sleeping positions L buttock, lateral hip, lateral leg -reported hx DDD and similar issues and sees Dr. Sherwood Gambler in [facility] but feels may take long to get appt there so came here today -no fevers, malaise, bowel or bladder loss of function, weakness -occ feels tingling in finger tips as well - usually in the morning when first wakes up, admits doe shave anxiety chronically - no current symptoms  ROS: See pertinent positives and negatives per HPI.  Past Medical History:  Diagnosis Date  . Allergic rhinitis   . Anxiety   . Anxiety and depression   . Constipation, chronic   . Coronary artery disease    06-2010, had CP----> Mild nonobstructive CAD on cardiac CT   . Hemorrhoids   . Hip pain, bilateral   . Hyperlipidemia   . Hypertension   . Leukoplakia of vagina   . Low back pain    w/ left side radiculopathy, s/p injection at L4-L5 forarmen 1/06  . Uterine fibroid     Past Surgical History:  Procedure Laterality Date  . ABDOMINAL HYSTERECTOMY  11-2000   no oophorectomy per pt  . CHOLECYSTECTOMY  12/08/2010   Procedure: LAPAROSCOPIC CHOLECYSTECTOMY WITH INTRAOPERATIVE CHOLANGIOGRAM;  Surgeon: Pedro Earls, MD;  Location: [facility];  Service: General;  Laterality: N/A;  c-arm   . COLONOSCOPY WITH PROPOFOL N/A 02/02/2014   Procedure: COLONOSCOPY WITH PROPOFOL;  Surgeon: Garlan Fair, MD;  Location: [facility];  Service: Endoscopy;  Laterality: N/A;  . LEEP     unknown date   . TUBAL LIGATION     bilateral 1989    Family History  Problem Relation Age of Onset  . Schizophrenia Son   . Schizophrenia Brother   . Schizophrenia Other     2 nieces  . Diabetes Neg Hx   . Heart attack Neg Hx   . Colon cancer Neg Hx   . Breast  cancer Neg Hx     Social History   Social History  . Marital status: Divorced    Spouse name: N/A  . Number of children: 3  . Years of education: N/A   Occupational History  . 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  [place]   Social History Main Topics  . Smoking status: Never Smoker  . Smokeless tobacco: Never Used  . Alcohol use Yes     Comment: rarely  . Drug use: No  . Sexual activity: No   Other Topics Concern  . None   Social History Narrative   Divorced, lives w/ 1 of her children   Original from [place], living in [place] since age 59                       Current Outpatient Prescriptions:  .  amLODipine (NORVASC) 5 MG tablet, Take 1 tablet (5 mg total) by mouth every morning., Disp: 90 tablet, Rfl: 2 .  benzonatate (TESSALON) 100 MG capsule, Take 2 capsules (200 mg total) by mouth 2 (two) times daily as needed for cough., Disp: 45 capsule, Rfl: 0 .  CASCARA SAGRADA PO, Take 3 capsules by mouth every other day. Reported on 04/29/2015, Disp: , Rfl:  .  fexofenadine (ALLEGRA ALLERGY) 180  MG tablet, Take 1 tablet (180 mg total) by mouth daily., Disp: 90 tablet, Rfl: 3 .  FLUoxetine (PROZAC) 20 MG tablet, Take 1 tablet (20 mg total) by mouth daily., Disp: 90 tablet, Rfl: 1 .  fluticasone (FLONASE) 50 MCG/ACT nasal spray, Place 1 spray into both nostrils 2 (two) times daily., Disp: 16 g, Rfl: 3 .  vitamin B-12 (CYANOCOBALAMIN) 1000 MCG tablet, Take 1,000 mcg by mouth daily. Reported on 04/29/2015, Disp: , Rfl:  .  predniSONE (DELTASONE) 10 MG tablet, 50mg  (5 tabs) x 2 days, then 30mg  (3 tabs) x 2 days, then 20mg  (2 tabs) x 3 days, then 10mg  (1 tab) x 3 days, Disp: 25 tablet, Rfl: 0  EXAM:  Vitals:   05/21/16 1142  BP: 124/72  Pulse: 72  Temp: 98.3 F (36.8 C)    Body mass index is 31.99 kg/m.  GENERAL: vitals reviewed and listed above, alert, oriented, appears well hydrated and in no acute distress  HEENT: atraumatic, conjunttiva clear, no  obvious abnormalities on inspection of external nose and ears  NECK: no obvious masses on inspection  MS: moves all extremities without noticeable abnormality, normal gait Normal Gait Normal inspection of back, no obvious scoliosis or leg length descrepancy No bony TTP Soft tissue TTP at: R PSIS, L glutes -/+ tests: neg trendelenburg,-facet loading, -SLRT, -CLRT, -FABER, -FADIR Normal muscle strength, sensation to light touch and DTRs in LEs bilaterally except ? Mildly decreased L patellar reflex  PSYCH: pleasant and cooperative, no obvious depression or anxiety  ASSESSMENT AND PLAN:  Discussed the following assessment and plan:  Sciatica of left side Paresthesia - Plan: TSH, Vitamin B12 -we discussed possible serious and likely etiologies, workup and treatment, treatment risks and return precautions -likely sciatica -after this discussion, Mailynn opted for contacting her specialist, HEP, in interim trial steroid taper, will check b12 and tsh for finger paresthesias she mentioned at the end of this visit and will have her follow up with PCP in 2-4 weeks -follow up advised 2-4 weeks with PCP and when possible with specialist -of course, we advised Ellora  to return or notify a doctor immediately if symptoms worsen or persist or new concerns arise.   Patient Instructions  BEFORE YOU LEAVE: -follow up: with CP in 2-4 weeks -labs -sciatica back exercises  Call to set up follow up with your back specialist.  Take the prednisone as instructed.  Do the back exercises per instructions 3-4 days per week.  Heat, Topical menthol (tiger balm) and tylenol per instructions can help with pain as needed.  I hope you are feeling better soon! Seek care immediately if worsening, new concerns or you are not improving with treatment.  We have ordered labs or studies at this visit. It can take up to 1-2 weeks for results and processing. IF results require follow up or explanation, we will call you  with instructions. Clinically stable results will be released to your [facility]. If you have not heard from [place] or cannot find your results in [facility] in 2 weeks please contact our office at [phone].  If you are not yet signed up for [facility], please consider signing up.            Colin Benton R., DO

## 2016-05-22 ENCOUNTER — Telehealth: Payer: Self-pay | Admitting: Family Medicine

## 2016-05-22 NOTE — Telephone Encounter (Signed)
Pt dropped off on 05/21/16.  Filled out and given to Dr. Martinique 05/22/16.

## 2016-06-04 ENCOUNTER — Ambulatory Visit (HOSPITAL_COMMUNITY): Payer: BC Managed Care – PPO | Admitting: Licensed Clinical Social Worker

## 2016-06-11 ENCOUNTER — Ambulatory Visit: Payer: BC Managed Care – PPO | Admitting: Family Medicine

## 2016-07-11 ENCOUNTER — Other Ambulatory Visit: Payer: Self-pay | Admitting: Medical

## 2016-07-19 ENCOUNTER — Telehealth: Payer: Self-pay | Admitting: Family Medicine

## 2016-07-19 NOTE — Telephone Encounter (Signed)
° ° ° °  Pt request refill of the following:  amLODipine (NORVASC) 5 MG tablet  Dr Martinique has not refilled this for her before.    Phamacy: [facility]

## 2016-07-20 ENCOUNTER — Telehealth: Payer: Self-pay | Admitting: Family Medicine

## 2016-07-20 MED ORDER — AMLODIPINE BESYLATE 5 MG PO TABS: 5.0000 mg | ORAL_TABLET | Freq: Every morning | ORAL | 1 refills | Status: DC

## 2016-07-20 NOTE — Telephone Encounter (Signed)
Rx has already been sent in 

## 2016-07-20 NOTE — Telephone Encounter (Signed)
Pt need new Rx for amlodipine  Pharm:  [facility] [place] [facility]  Pt is out of her medication.

## 2016-07-20 NOTE — Telephone Encounter (Signed)
Rx sent 

## 2016-08-21 ENCOUNTER — Other Ambulatory Visit: Payer: Self-pay | Admitting: Family Medicine

## 2016-08-21 DIAGNOSIS — F419 Anxiety disorder, unspecified: Secondary | ICD-10-CM

## 2016-08-23 NOTE — Telephone Encounter (Signed)
Please advise on refill. Thanks. 

## 2016-08-24 NOTE — Telephone Encounter (Signed)
I sent script e-scribe to [facility] and left a voice message for pt with this information.  

## 2016-08-24 NOTE — Telephone Encounter (Signed)
Call in #[phone] with no rf  

## 2016-08-24 NOTE — Telephone Encounter (Signed)
Pt states she is completely out of her  FLUoxetine (PROZAC) 20 MG tablet  And would like enough to at least get through the weekend.  [facility] [place], [place]

## 2016-10-09 NOTE — Progress Notes (Signed)
HPI:   Veronica [name] is a 66 y.o. female, who is here today follow on some of her chronic medical problems. She is also requesting Rx for Xanax.  Last seen on 05/14/16 for acute visit.   02/29/16 she was referred to endocrinologists for thyroid nodules.she Has appointment December 2018.  Anxiety: She has been on Xanax 0.25 mg at night when she cannot sleep.  She denies side effects, she has been taking it for years and usually one or twice per week. She is also on Prozac 20 mg daily. 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  Obesity:  She has not been exercising regularly, she just started a few weeks ago. She is trying to follow a healthy diet. She has noted some weight gain.   Hypertension:   Currently on Amlodipine 5 mg daily.  She is taking medications as instructed, no side effects reported. Last eye exam a year ago. She is following low salt diet. She has not noted unusual headache, visual changes, exertional chest pain, dyspnea,  focal weakness, or edema.   Lab Results  Component Value Date   CREATININE 0.69 12/21/2015   BUN 10 12/21/2015   NA 140 12/21/2015   K 4.1 12/21/2015   CL 104 12/21/2015   CO2 28 12/21/2015     Review of Systems  Constitutional: Negative for activity change, appetite change, fatigue and fever.  HENT: Negative for mouth sores, nosebleeds and trouble swallowing.   Eyes: Negative for redness and visual disturbance.  Respiratory: Negative for cough, shortness of breath and wheezing.   Cardiovascular: Negative for chest pain, palpitations and leg swelling.  Gastrointestinal: Negative for abdominal pain, nausea and vomiting.       Negative for changes in bowel habits.  Endocrine: Negative for cold intolerance and heat intolerance.  Genitourinary: Negative for decreased urine volume and hematuria.    Allergic/Immunologic: Positive for environmental allergies.  Neurological: Negative for syncope, weakness and headaches.  Psychiatric/Behavioral: Positive for sleep disturbance. Negative for confusion and suicidal ideas. The patient is nervous/anxious.       Current Outpatient Prescriptions on File Prior to Visit  Medication Sig Dispense Refill  . amLODipine (NORVASC) 5 MG tablet Take 1 tablet (5 mg total) by mouth every morning. 90 tablet 1  . fexofenadine (ALLEGRA ALLERGY) 180 MG tablet Take 1 tablet (180 mg total) by mouth daily. 90 tablet 3  . FLUoxetine (PROZAC) 20 MG tablet TAKE 1 TABLET(20 MG) BY MOUTH DAILY 30 tablet 0  . fluticasone (FLONASE) 50 MCG/ACT nasal spray Place 1 spray into both nostrils 2 (two) times daily. 16 g 3   No current facility-administered medications on file prior to visit.      Past Medical History:  Diagnosis Date  . Allergic rhinitis   . Anxiety   . Anxiety and depression   . Constipation, chronic   . Coronary artery disease    06-2010, had CP----> Mild nonobstructive CAD on cardiac CT   . Hemorrhoids   . Hip pain, bilateral   . Hyperlipidemia   . Hypertension   . Leukoplakia of vagina   . Low back pain    w/ left side radiculopathy, s/p injection at L4-L5 forarmen 1/06  . Uterine fibroid    No Known Allergies  Social History   Social History  . Marital status: Divorced    Spouse name: N/A  .  Number of children: 3  . Years of education: N/A   Occupational History  . 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  [place]   Social History Main Topics  . Smoking status: Never Smoker  . Smokeless tobacco: Never Used  . Alcohol use Yes     Comment: rarely  . Drug use: No  . Sexual activity: No   Other Topics Concern  . None   Social History Narrative   Divorced, lives w/ 1 of her children   Original from [place], living in [place] since age 42                      Vitals:   10/10/16 1507  BP: 126/80  Pulse: 76   Resp: 12  SpO2: 98%   Body mass index is 32.68 kg/m.   Wt Readings from Last 3 Encounters:  10/10/16 202 lb 8 oz (91.9 kg)  05/21/16 198 lb 3.2 oz (89.9 kg)  05/14/16 198 lb 6.4 oz (90 kg)     Physical Exam  Nursing note and vitals reviewed. Constitutional: She is oriented to person, place, and time. She appears well-developed. No distress.  HENT:  Head: Normocephalic and atraumatic.  Mouth/Throat: Oropharynx is clear and moist and mucous membranes are normal.  Eyes: Pupils are equal, round, and reactive to light. Conjunctivae are normal.  Cardiovascular: Normal rate and regular rhythm.   No murmur heard. Pulses:      Dorsalis pedis pulses are 2+ on the right side, and 2+ on the left side.  Respiratory: Effort normal and breath sounds normal. No respiratory distress.  Musculoskeletal: She exhibits edema (Trace pitting edema LE, bilateral.). She exhibits no tenderness.  Lymphadenopathy:    She has no cervical adenopathy.  Neurological: She is alert and oriented to person, place, and time. She has normal strength. Coordination and gait normal.  Skin: Skin is warm. No rash noted. No erythema.  Psychiatric: Her mood appears anxious.  Well groomed, good eye contact.    ASSESSMENT AND PLAN:   [name] was seen today for anxiety and follow-up.  Diagnoses and all orders for this visit:  Insomnia secondary to anxiety  In general she has tolerating medication well, reporting is taking it once or twice per week and no side effects. We discussed some side effects. No changes in current management.  -     ALPRAZolam (XANAX) 0.25 MG tablet; Take 1 tablet (0.25 mg total) by mouth at bedtime as needed for anxiety.  Class 1 obesity without serious comorbidity with body mass index (BMI) of 32.0 to 32.9 in adult, unspecified obesity type  She has gained about 5 pounds since last OV. We discussed benefits of wt loss as well as adverse effects of obesity. Consistency with healthy  diet and physical activity recommended.  Essential hypertension  Well controlled. Continue low salt diet. At least annual eye exam recommended. I think we can hold on labs until next OV. Follow-up in 6 months.   Generalized anxiety disorder  Well controlled. No changes in current management. Follow-up in 6 months, before if needed.       Betty G. Martinique, MD  [facility]. [place] office.

## 2016-10-10 ENCOUNTER — Encounter: Payer: Self-pay | Admitting: Family Medicine

## 2016-10-10 ENCOUNTER — Ambulatory Visit (INDEPENDENT_AMBULATORY_CARE_PROVIDER_SITE_OTHER): Payer: BC Managed Care – PPO | Admitting: Family Medicine

## 2016-10-10 VITALS — BP 126/80 | HR 76 | Resp 12 | Ht 66.0 in | Wt 202.5 lb

## 2016-10-10 DIAGNOSIS — F411 Generalized anxiety disorder: Secondary | ICD-10-CM | POA: Diagnosis not present

## 2016-10-10 DIAGNOSIS — Z6832 Body mass index (BMI) 32.0-32.9, adult: Secondary | ICD-10-CM

## 2016-10-10 DIAGNOSIS — F5105 Insomnia due to other mental disorder: Secondary | ICD-10-CM

## 2016-10-10 DIAGNOSIS — E66811 Obesity, class 1: Secondary | ICD-10-CM

## 2016-10-10 DIAGNOSIS — E669 Obesity, unspecified: Secondary | ICD-10-CM

## 2016-10-10 DIAGNOSIS — I10 Essential (primary) hypertension: Secondary | ICD-10-CM

## 2016-10-10 DIAGNOSIS — F419 Anxiety disorder, unspecified: Secondary | ICD-10-CM | POA: Diagnosis not present

## 2016-10-10 DIAGNOSIS — Z6836 Body mass index (BMI) 36.0-36.9, adult: Secondary | ICD-10-CM

## 2016-10-10 MED ORDER — ALPRAZOLAM 0.25 MG PO TABS: 0.2500 mg | ORAL_TABLET | Freq: Every evening | ORAL | 1 refills | Status: DC | PRN

## 2016-10-10 NOTE — Patient Instructions (Signed)
A few things to remember from today's visit:   Insomnia secondary to anxiety - Plan: ALPRAZolam (XANAX) 0.25 MG tablet  Generalized anxiety disorder  Essential hypertension Alprazolam tablets What is this medicine? ALPRAZOLAM (al PRAY zoe lam) is a benzodiazepine. It is used to treat anxiety and panic attacks. This medicine may be used for other purposes; ask your health care provider or pharmacist if you have questions. COMMON BRAND NAME(S): Xanax What should I tell my health care provider before I take this medicine? They need to know if you have any of these conditions: -an alcohol or drug abuse problem -bipolar disorder, depression, psychosis or other mental health conditions -glaucoma -kidney or liver disease -lung or breathing disease -myasthenia gravis -Parkinson's disease -porphyria -seizures or a history of seizures -suicidal thoughts -an unusual or allergic reaction to alprazolam, other benzodiazepines, foods, dyes, or preservatives -pregnant or trying to get pregnant -breast-feeding How should I use this medicine? Take this medicine by mouth with a glass of water. Follow the directions on the prescription label. Take your medicine at regular intervals. Do not take it more often than directed. Do not stop taking except on your doctor's advice. A special MedGuide will be given to you by the pharmacist with each prescription and refill. Be sure to read this information carefully each time. Talk to your pediatrician regarding the use of this medicine in children. Special care may be needed. Overdosage: If you think you have taken too much of this medicine contact a poison control center or emergency room at once. NOTE: This medicine is only for you. Do not share this medicine with others. What if I miss a dose? If you miss a dose, take it as soon as you can. If it is almost time for your next dose, take only that dose. Do not take double or extra doses. What may interact  with this medicine? Do not take this medicine with any of the following medications: -certain antiviral medicines for HIV or AIDS like delavirdine, indinavir -certain medicines for fungal infections like ketoconazole and itraconazole -narcotic medicines for cough -sodium oxybate This medicine may also interact with the following medications: -alcohol -antihistamines for allergy, cough and cold -certain antibiotics like clarithromycin, erythromycin, isoniazid, rifampin, rifapentine, rifabutin, and troleandomycin -certain medicines for blood pressure, heart disease, irregular heart beat -certain medicines for depression, like amitriptyline, fluoxetine, sertraline -certain medicines for seizures like carbamazepine, oxcarbazepine, phenobarbital, phenytoin, primidone -cimetidine -cyclosporine -female hormones, like estrogens or progestins and birth control pills, patches, rings, or injections -general anesthetics like halothane, isoflurane, methoxyflurane, propofol -grapefruit juice -local anesthetics like lidocaine, pramoxine, tetracaine -medicines that relax muscles for surgery -narcotic medicines for pain -other antiviral medicines for HIV or AIDS -phenothiazines like chlorpromazine, mesoridazine, prochlorperazine, thioridazine This list may not describe all possible interactions. Give your health care provider a list of all the medicines, herbs, non-prescription drugs, or dietary supplements you use. Also tell them if you smoke, drink alcohol, or use illegal drugs. Some items may interact with your medicine. What should I watch for while using this medicine? Tell your doctor or health care professional if your symptoms do not start to get better or if they get worse. Do not stop taking except on your doctor's advice. You may develop a severe reaction. Your doctor will tell you how much medicine to take. You may get drowsy or dizzy. Do not drive, use machinery, or do anything that needs  mental alertness until you know how this medicine affects you. To reduce the  risk of dizzy and fainting spells, do not stand or sit up quickly, especially if you are an older patient. Alcohol may increase dizziness and drowsiness. Avoid alcoholic drinks. If you are taking another medicine that also causes drowsiness, you may have more side effects. Give your health care provider a list of all medicines you use. Your doctor will tell you how much medicine to take. Do not take more medicine than directed. Call emergency for help if you have problems breathing or unusual sleepiness. What side effects may I notice from receiving this medicine? Side effects that you should report to your doctor or health care professional as soon as possible: -allergic reactions like skin rash, itching or hives, swelling of the face, lips, or tongue -breathing problems -confusion -loss of balance or coordination -signs and symptoms of low blood pressure like dizziness; feeling faint or lightheaded, falls; unusually weak or tired -suicidal thoughts or other mood changes Side effects that usually do not require medical attention (report to your doctor or health care professional if they continue or are bothersome): -dizziness -dry mouth -nausea, vomiting -tiredness This list may not describe all possible side effects. Call your doctor for medical advice about side effects. You may report side effects to FDA at 1-800-FDA-1088. Where should I keep my medicine? Keep out of the reach of children. This medicine can be abused. Keep your medicine in a safe place to protect it from theft. Do not share this medicine with anyone. Selling or giving away this medicine is dangerous and against the law. Store at room temperature between 20 and 25 degrees C (68 and 77 degrees F). This medicine may cause accidental overdose and death if taken by other adults, children, or pets. Mix any unused medicine with a substance like cat litter or  coffee grounds. Then throw the medicine away in a sealed container like a sealed bag or a coffee can with a lid. Do not use the medicine after the expiration date. NOTE: This sheet is a summary. It may not cover all possible information. If you have questions about this medicine, talk to your doctor, pharmacist, or health care provider.  2018 Elsevier/Gold Standard (2014-10-14 13:47:25)   Please be sure medication list is accurate. If a new problem present, please set up appointment sooner than planned today.

## 2016-10-18 ENCOUNTER — Encounter: Payer: Self-pay | Admitting: Family Medicine

## 2016-12-17 ENCOUNTER — Encounter: Payer: Self-pay | Admitting: Adult Health

## 2016-12-17 ENCOUNTER — Ambulatory Visit: Payer: BC Managed Care – PPO | Admitting: Adult Health

## 2016-12-17 VITALS — BP 126/62 | Temp 98.6°F | Wt 209.0 lb

## 2016-12-17 DIAGNOSIS — J0140 Acute pansinusitis, unspecified: Secondary | ICD-10-CM

## 2016-12-17 DIAGNOSIS — R05 Cough: Secondary | ICD-10-CM | POA: Diagnosis not present

## 2016-12-17 DIAGNOSIS — R059 Cough, unspecified: Secondary | ICD-10-CM

## 2016-12-17 MED ORDER — DOXYCYCLINE HYCLATE 100 MG PO CAPS: 100.0000 mg | ORAL_CAPSULE | Freq: Two times a day (BID) | ORAL | 0 refills | Status: DC

## 2016-12-17 MED ORDER — BENZONATATE 200 MG PO CAPS: 200.0000 mg | ORAL_CAPSULE | Freq: Two times a day (BID) | ORAL | 1 refills | Status: DC | PRN

## 2016-12-17 NOTE — Progress Notes (Signed)
Subjective:    Patient ID: Veronica [name], female    DOB: [birth year], 66 y.o.   MRN: [id number]  URI   This is a new problem. The current episode started 1 to 4 weeks ago (two weeks ). The problem has been unchanged. There has been no fever. Associated symptoms include congestion, coughing (productive ), ear pain, headaches, a plugged ear sensation, sinus pain and a sore throat. Pertinent negatives include no rhinorrhea or wheezing. Treatments tried: "home remedies" The treatment provided mild relief.      Review of Systems  Constitutional: Positive for activity change. Negative for fever.  HENT: Positive for congestion, ear pain, sinus pressure, sinus pain and sore throat. Negative for postnasal drip and rhinorrhea.   Respiratory: Positive for cough (productive ) and chest tightness. Negative for wheezing.   Cardiovascular: Negative.   Neurological: Positive for headaches.  Hematological: Negative for adenopathy.   Past Medical History:  Diagnosis Date  . Allergic rhinitis   . Anxiety   . Anxiety and depression   . Constipation, chronic   . Coronary artery disease    06-2010, had CP----> Mild nonobstructive CAD on cardiac CT   . Hemorrhoids   . Hip pain, bilateral   . Hyperlipidemia   . Hypertension   . Leukoplakia of vagina   . Low back pain    w/ left side radiculopathy, s/p injection at L4-L5 forarmen 1/06  . Uterine fibroid     Social History   Socioeconomic History  . Marital status: Divorced    Spouse name: Not on file  . Number of children: 3  . Years of education: Not on file  . Highest education level: Not on file  Social Needs  . Financial resource strain: Not on file  . Food insecurity - worry: Not on file  . Food insecurity - inability: Not on file  . Transportation needs - medical: Not on file  . Transportation needs - non-medical: Not on file  Occupational History  . 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 Smoking status: Never Smoker  . Smokeless tobacco: Never Used  Substance and Sexual Activity  . Alcohol use: Yes    Comment: rarely  . Drug use: No  . Sexual activity: No    Birth control/protection: Surgical  Other Topics Concern  . Not on file  Social History Narrative   Divorced, lives w/ 1 of her children   Original from [place], living in [place] since age 35                      Past Surgical History:  Procedure Laterality Date  . ABDOMINAL HYSTERECTOMY  11-2000   no oophorectomy per pt  . COLONOSCOPY WITH PROPOFOL N/A 02/02/2014   Performed by Garlan Fair, MD at [facility]  . LAPAROSCOPIC CHOLECYSTECTOMY WITH INTRAOPERATIVE CHOLANGIOGRAM N/A 12/08/2010   Performed by Pedro Earls, MD at [facility]  . LEEP     unknown date   . TUBAL LIGATION     bilateral 1989    Family History  Problem Relation Age of Onset  . Schizophrenia Son   . Schizophrenia Brother   . Schizophrenia Other        2 nieces  . Diabetes Neg Hx   . Heart attack Neg Hx   . Colon cancer Neg Hx   . Breast cancer Neg Hx     No  Known Allergies  Current Outpatient Medications on File Prior to Visit  Medication Sig Dispense Refill  . ALPRAZolam (XANAX) 0.25 MG tablet Take 1 tablet (0.25 mg total) by mouth at bedtime as needed for anxiety. 20 tablet 1  . amLODipine (NORVASC) 5 MG tablet Take 1 tablet (5 mg total) by mouth every morning. 90 tablet 1  . fexofenadine (ALLEGRA ALLERGY) 180 MG tablet Take 1 tablet (180 mg total) by mouth daily. 90 tablet 3  . FLUoxetine (PROZAC) 20 MG tablet TAKE 1 TABLET(20 MG) BY MOUTH DAILY 30 tablet 0  . fluticasone (FLONASE) 50 MCG/ACT nasal spray Place 1 spray into both nostrils 2 (two) times daily. 16 g 3   No current facility-administered medications on file prior to visit.     BP 126/62 (BP Location: Left Arm)   Temp 98.6 F (37 C) (Oral)   Wt 209 lb (94.8 kg)   BMI 33.73 kg/m       Objective:   Physical Exam    Constitutional: She is oriented to person, place, and time. She appears well-developed and well-nourished. She appears distressed.  HENT:  Head: Normocephalic and atraumatic.  Right Ear: Hearing, tympanic membrane, external ear and ear canal normal.  Left Ear: External ear normal.  Nose: Mucosal edema and rhinorrhea present. Right sinus exhibits maxillary sinus tenderness and frontal sinus tenderness. Left sinus exhibits maxillary sinus tenderness and frontal sinus tenderness.  Mouth/Throat: Uvula is midline, oropharynx is clear and moist and mucous membranes are normal. No oropharyngeal exudate.  Eyes: Conjunctivae and EOM are normal. Pupils are equal, round, and reactive to light. Right eye exhibits no discharge. No scleral icterus.  Neck: Normal range of motion. Neck supple. No thyromegaly present.  Cardiovascular: Normal rate, regular rhythm, normal heart sounds and intact distal pulses. Exam reveals no gallop and no friction rub.  No murmur heard. Pulmonary/Chest: Effort normal and breath sounds normal. No respiratory distress. She has no wheezes. She has no rales. She exhibits no tenderness.  Lymphadenopathy:    She has no cervical adenopathy.  Neurological: She is alert and oriented to person, place, and time.  Skin: Skin is warm and dry. No rash noted. She is not diaphoretic. No erythema. No pallor.  Psychiatric: She has a normal mood and affect. Her behavior is normal. Judgment and thought content normal.  Nursing note and vitals reviewed.      Assessment & Plan:  1. Acute non-recurrent pansinusitis - Will treat for sinusitis due to time frame and symptoms.  - doxycycline (VIBRAMYCIN) 100 MG capsule; Take 1 capsule (100 mg total) 2 (two) times daily by mouth.  Dispense: 14 capsule; Refill: 0 - Add flonase  - Stay hydrated and rest  - Follow up if no improvement in the next 2-3 days   2. Cough  - benzonatate (TESSALON) 200 MG capsule; Take 1 capsule (200 mg total) 2 (two)  times daily as needed by mouth for cough.  Dispense: 20 capsule; Refill: 1   Dorothyann Peng, NP

## 2016-12-25 NOTE — Progress Notes (Signed)
HPI:   Veronica [name] is a 66 y.o. female, who is here today for her routine physical.  Last CPE: 12/27/15  Regular exercise 3 or more time per week: Not consistently. Following a healthy diet: Not consistently.  She lives alone but her daughter and her family live in town.   Chronic medical problems: Anxiety,HTN,HLD, and thyroid nodule (Follows with Dr Lindwood Qua).  HLD: She is on non pharmacologic treatment.  Pap smear: S/P hysterectomy due to fibroids Hx of abnormal pap smears: None in the past 20 years. LEEP in 1980's. Hx of STD's Denies.  Immunization History  Administered Date(s) Administered  . Hepatitis A, Adult 10/26/2015  . Influenza Whole 10/07/2009  . MMR 10/26/2015  . Tdap 10/24/2014  . Zoster 09/01/2012    Mammogram: 01/13/2016 [facility] 1. Colonoscopy: 02/02/2014, 5 years f/u recommended. DEXA: Normal in 08/2011  Hep C screening: She has not had it before,would like it done today.  Last eye exam over a year ago.  Concerns today:   Anxiety: She is curently on Prozac, which she has taking for a little over a year. She has had anxiety for 30 years. She is on Alprazolam 0.25 mg daily as needed. She is working part time, feels like stress is exacerbating anxiety and not sure if Prozac is helping.  She has not tried other anxiolytic medications.  She denies depressed mood or suicidal thoughts.   Review of Systems  Constitutional: Positive for fatigue. Negative for appetite change, fever and unexpected weight change.  HENT: Negative for dental problem, hearing loss, nosebleeds, sore throat, trouble swallowing and voice change.   Eyes: Negative for redness and visual disturbance.  Respiratory: Negative for cough, shortness of breath and wheezing.   Cardiovascular: Negative for chest pain and leg swelling.  Gastrointestinal: Negative for abdominal pain, blood in stool, nausea and vomiting.       No changes in bowel habits.  Endocrine: Negative for  cold intolerance, heat intolerance, polydipsia, polyphagia and polyuria.  Genitourinary: Negative for decreased urine volume, dysuria, hematuria, menstrual problem, vaginal bleeding and vaginal discharge.       No breast tenderness or nipple discharge.  Musculoskeletal: Positive for arthralgias. Negative for gait problem and myalgias.  Skin: Negative for pallor and rash.  Allergic/Immunologic: Positive for environmental allergies.  Neurological: Negative for syncope, weakness, numbness and headaches.  Hematological: Negative for adenopathy. Does not bruise/bleed easily.  Psychiatric/Behavioral: Negative for confusion and sleep disturbance. The patient is nervous/anxious.   All other systems reviewed and are negative.     Current Outpatient Medications on File Prior to Visit  Medication Sig Dispense Refill  . ALPRAZolam (XANAX) 0.25 MG tablet Take 1 tablet (0.25 mg total) by mouth at bedtime as needed for anxiety. 20 tablet 1  . amLODipine (NORVASC) 5 MG tablet Take 1 tablet (5 mg total) by mouth every morning. 90 tablet 1  . fexofenadine (ALLEGRA ALLERGY) 180 MG tablet Take 1 tablet (180 mg total) by mouth daily. 90 tablet 3  . fluticasone (FLONASE) 50 MCG/ACT nasal spray Place 1 spray into both nostrils 2 (two) times daily. (Patient taking differently: Place 1 spray into both nostrils as needed. ) 16 g 3   No current facility-administered medications on file prior to visit.      Past Medical History:  Diagnosis Date  . Allergic rhinitis   . Anxiety   . Anxiety and depression   . Constipation, chronic   . Coronary artery disease    06-2010,  had CP----> Mild nonobstructive CAD on cardiac CT   . Hemorrhoids   . Hip pain, bilateral   . Hyperlipidemia   . Hypertension   . Leukoplakia of vagina   . Low back pain    w/ left side radiculopathy, s/p injection at L4-L5 forarmen 1/06  . Uterine fibroid     Past Surgical History:  Procedure Laterality Date  . ABDOMINAL HYSTERECTOMY   11-2000   no oophorectomy per pt  . CHOLECYSTECTOMY  12/08/2010   Procedure: LAPAROSCOPIC CHOLECYSTECTOMY WITH INTRAOPERATIVE CHOLANGIOGRAM;  Surgeon: Pedro Earls, MD;  Location: [facility] ORS;  Service: General;  Laterality: N/A;  c-arm   . COLONOSCOPY WITH PROPOFOL N/A 02/02/2014   Procedure: COLONOSCOPY WITH PROPOFOL;  Surgeon: Garlan Fair, MD;  Location: [facility] END[facility]COPY;  Service: Endoscopy;  Laterality: N/A;  . LEEP     unknown date   . TUBAL LIGATION     bilateral 1989    No Known Allergies  Family History  Problem Relation Age of Onset  . Schizophrenia Son   . Schizophrenia Brother   . Schizophrenia Other        2 nieces  . Diabetes Neg Hx   . Heart attack Neg Hx   . Colon cancer Neg Hx   . Breast cancer Neg Hx     Social History   Socioeconomic History  . Marital status: Divorced    Spouse name: None  . Number of children: 3  . Years of education: None  . Highest education level: None  Social Needs  . Financial resource strain: None  . Food insecurity - worry: None  . Food insecurity - inability: None  . Transportation needs - medical: None  . Transportation needs - non-medical: None  Occupational History  . 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 Smoking status: Never Smoker  . Smokeless tobacco: Never Used  Substance and Sexual Activity  . Alcohol use: Yes    Comment: rarely  . Drug use: No  . Sexual activity: No    Birth control/protection: Surgical  Other Topics Concern  . None  Social History Narrative   Divorced, lives w/ 1 of her children   Original from [place], living in [place] since age 60                       Vitals:   12/26/16 0750  BP: 124/70  Pulse: 68  Resp: 12  Temp: 98.7 F (37.1 C)  SpO2: 97%   Body mass index is 34.56 kg/m.   Wt Readings from Last 3 Encounters:  12/26/16 214 lb 2 oz (97.1 kg)  12/17/16 209 lb (94.8 kg)  10/10/16 202 lb 8 oz (91.9 kg)     Physical Exam  Nursing note and vitals reviewed. Constitutional: She is oriented to person, place, and time. She appears well-developed. No distress.  HENT:  Head: Normocephalic and atraumatic.  Right Ear: Hearing, tympanic membrane, external ear and ear canal normal.  Left Ear: Hearing, tympanic membrane, external ear and ear canal normal.  Mouth/Throat: Uvula is midline, oropharynx is clear and moist and mucous membranes are normal.  Eyes: Conjunctivae and EOM are normal. Pupils are equal, round, and reactive to light.  Neck: No tracheal deviation present. Thyromegaly (mild) present.  Cardiovascular: Normal rate and regular rhythm.  No murmur heard. Pulses:      Dorsalis pedis pulses are 2+ on the right side,  and 2+ on the left side.  Respiratory: Effort normal and breath sounds normal. No respiratory distress.  GI: Soft. She exhibits no mass. There is no hepatomegaly. There is no tenderness.  Genitourinary:  Genitourinary Comments: Breast: No masses,skin changes,or nipple discharge.  Musculoskeletal: She exhibits no edema or tenderness.  No major deformity or signs of synovitis appreciated.  Lymphadenopathy:    She has no cervical adenopathy.    She has no axillary adenopathy.       Right: No supraclavicular adenopathy present.       Left: No supraclavicular adenopathy present.  Neurological: She is alert and oriented to person, place, and time. She has normal strength. No cranial nerve deficit. Coordination and gait normal.  Reflex Scores:      Bicep reflexes are 2+ on the right side and 2+ on the left side.      Patellar reflexes are 2+ on the right side and 2+ on the left side. Skin: Skin is warm. No rash noted. No erythema.  Psychiatric: Her mood appears anxious.  Well groomed, good eye contact.    ASSESSMENT AND PLAN:   Veronica [name] was seen today for annual exam.  Diagnoses and all orders for this visit:  Lab Results  Component Value Date   CHOL 194 12/26/2016    HDL 42.20 12/26/2016   LDLCALC 125 (H) 12/26/2016   LDLDIRECT 161.6 06/03/2012   TRIG 135.0 12/26/2016   CHOLHDL 5 12/26/2016   Lab Results  Component Value Date   CREATININE 0.67 12/26/2016   BUN 11 12/26/2016   NA 139 12/26/2016   K 4.2 12/26/2016   CL 107 12/26/2016   CO2 27 12/26/2016   Lab Results  Component Value Date   ALT 26 12/26/2016   AST 26 12/26/2016   ALKPHOS 65 12/26/2016   BILITOT 0.6 12/26/2016    Routine general medical examination at a health care facility  We discussed the importance of regular physical activity and healthy diet for prevention of chronic illness and/or complications. Preventive guidelines reviewed. Vaccination up to date.Refsued flu vaccine.  Ca++ and vit D supplementation recommended. Next CPE in a year.  The 10-year ASCVD risk score Mikey Bussing DC Brooke Bonito., et al., 2013) is: 7%   Values used to calculate the score:     Age: 13 years     Sex: Female     Is Non-Hispanic African American: No     Diabetic: No     Tobacco smoker: No     Systolic Blood Pressure: 798 mmHg     Is BP treated: Yes     HDL Cholesterol: 42.2 mg/dL     Total Cholesterol: 194 mg/dL  Dyslipidemia  Continue non pharmacologic treatment for now. Further recommendations will be given according to lab results. F/U in 6-12 months.  -     Lipid panel  Encounter for HCV screening test for high risk patient -     Hepatitis C antibody screen  Diabetes mellitus screening -     Comprehensive metabolic panel  Generalized anxiety disorder  Worsening. Other pharmacologic options discussed,she agrees with trying Sertraline 50 mg.Explained that she can change from Prozac to Sertraline without weaning off the former one. I am not expecting withdrawal symptoms but is she does we may need to wean off Prozac first.  Instructed about warning signs. F/U in 6-8 weeks.   -     sertraline (ZOLOFT) 50 MG tablet; Take 1 tablet (50 mg total) by mouth daily.  Return in 2  months (on 02/25/2017) for anxiety.      Adoni Greenough G. Martinique, MD  [facility]. [place] office.

## 2016-12-26 ENCOUNTER — Ambulatory Visit (INDEPENDENT_AMBULATORY_CARE_PROVIDER_SITE_OTHER): Payer: BC Managed Care – PPO | Admitting: Family Medicine

## 2016-12-26 ENCOUNTER — Encounter: Payer: Self-pay | Admitting: Family Medicine

## 2016-12-26 VITALS — BP 124/70 | HR 68 | Temp 98.7°F | Resp 12 | Ht 66.0 in | Wt 214.1 lb

## 2016-12-26 DIAGNOSIS — Z1159 Encounter for screening for other viral diseases: Secondary | ICD-10-CM | POA: Diagnosis not present

## 2016-12-26 DIAGNOSIS — E785 Hyperlipidemia, unspecified: Secondary | ICD-10-CM | POA: Diagnosis not present

## 2016-12-26 DIAGNOSIS — Z0001 Encounter for general adult medical examination with abnormal findings: Secondary | ICD-10-CM | POA: Diagnosis not present

## 2016-12-26 DIAGNOSIS — Z9189 Other specified personal risk factors, not elsewhere classified: Secondary | ICD-10-CM | POA: Diagnosis not present

## 2016-12-26 DIAGNOSIS — Z131 Encounter for screening for diabetes mellitus: Secondary | ICD-10-CM | POA: Diagnosis not present

## 2016-12-26 DIAGNOSIS — F411 Generalized anxiety disorder: Secondary | ICD-10-CM | POA: Diagnosis not present

## 2016-12-26 DIAGNOSIS — Z0000 Encounter for general adult medical examination without abnormal findings: Secondary | ICD-10-CM

## 2016-12-26 LAB — LIPID PANEL
Cholesterol: 194 mg/dL (ref 0–200)
HDL: 42.2 mg/dL (ref >39.00)
LDL Cholesterol: 125 mg/dL — ABNORMAL HIGH (ref 0–99)
NonHDL: 151.92
TRIGLYCERIDES: 135 mg/dL (ref 0.0–149.0)
Total CHOL/HDL Ratio: 5
VLDL: 27 mg/dL (ref 0.0–40.0)

## 2016-12-26 LAB — COMPREHENSIVE METABOLIC PANEL
ALK PHOS: 65 U/L (ref 39–117)
ALT: 26 U/L (ref 0–35)
AST: 26 U/L (ref 0–37)
Albumin: 4.2 g/dL (ref 3.5–5.2)
BILIRUBIN TOTAL: 0.6 mg/dL (ref 0.2–1.2)
BUN: 11 mg/dL (ref 6–23)
CALCIUM: 9.4 mg/dL (ref 8.4–10.5)
CO2: 27 mEq/L (ref 19–32)
Chloride: 107 mEq/L (ref 96–112)
Creatinine, Ser: 0.67 mg/dL (ref 0.40–1.20)
GFR: 93.93 mL/min (ref >60.00)
Glucose, Bld: 93 mg/dL (ref 70–99)
Potassium: 4.2 mEq/L (ref 3.5–5.1)
Sodium: 139 mEq/L (ref 135–145)
TOTAL PROTEIN: 6.9 g/dL (ref 6.0–8.3)

## 2016-12-26 MED ORDER — SERTRALINE HCL 50 MG PO TABS: 50.0000 mg | ORAL_TABLET | Freq: Every day | ORAL | 1 refills | Status: DC

## 2016-12-26 NOTE — Patient Instructions (Signed)
A few things to remember from today's visit:   Dyslipidemia - Plan: Lipid panel  Encounter for HCV screening test for high risk patient - Plan: Hepatitis C antibody screen  Diabetes mellitus screening - Plan: Comprehensive metabolic panel  Routine general medical examination at a health care facility  Generalized anxiety disorder - Plan: sertraline (ZOLOFT) 50 MG tablet  Fluoxetine to stop and Sertraline to start.  Today you have you routine preventive visit.  At least 150 minutes of moderate exercise per week, daily brisk walking for 15-30 min is a good exercise option. Healthy diet low in saturated (animal) fats and sweets and consisting of fresh fruits and vegetables, lean meats such as fish and white chicken and whole grains.  These are some of recommendations for screening depending of age and risk factors:   - Vaccines:  Tdap vaccine every 10 years.  Shingles vaccine recommended at age 51, could be given after 64 years of age but not sure about insurance coverage.   Pneumonia vaccines:  Prevnar 13 at 65 and Pneumovax at 98. Sometimes Pneumovax is giving earlier if history of smoking, lung disease,diabetes,kidney disease among some.    Screening for diabetes at age 44 and every 3 years.  Cervical cancer prevention:  Pap smear starts at 64 years of age and continues periodically until 64 years old in low risk women. Pap smear every 3 years between 66 and 29 years old. Pap smear every 3-5 years between women 72 and older if pap smear negative and HPV screening negative.   -Breast cancer: Mammogram: Please schedule appt.   Colon cancer screening: starts at 64 years old until 64 years old.   Also recommended:  1. Dental visit- Brush and floss your teeth twice daily; visit your dentist twice a year. 2. Eye doctor- Get an eye exam at least every 2 years. 3. Helmet use- Always wear a helmet when riding a bicycle, motorcycle, rollerblading or skateboarding. 4. Safe  sex- If you may be exposed to sexually transmitted infections, use a condom. 5. Seat belts- Seat belts can save your live; always wear one. 6. Smoke/Carbon Monoxide detectors- These detectors need to be installed on the appropriate level of your home. Replace batteries at least once a year. 7. Skin cancer- When out in the sun please cover up and use sunscreen 15 SPF or higher. 8. Violence- If anyone is threatening or hurting you, please tell your healthcare provider.  9. Drink alcohol in moderation- Limit alcohol intake to one drink or less per day. Never drink and drive.  Please be sure medication list is accurate. If a new problem present, please set up appointment sooner than planned today.

## 2016-12-27 LAB — HEPATITIS C ANTIBODY
HEP C AB: NONREACTIVE
SIGNAL TO CUT-OFF: 0.02 (ref <1.00)

## 2016-12-31 ENCOUNTER — Encounter: Payer: Self-pay | Admitting: Family Medicine

## 2017-02-21 ENCOUNTER — Other Ambulatory Visit: Payer: Self-pay | Admitting: Family Medicine

## 2017-02-21 ENCOUNTER — Other Ambulatory Visit: Payer: Self-pay | Admitting: Internal Medicine

## 2017-02-21 DIAGNOSIS — Z1231 Encounter for screening mammogram for malignant neoplasm of breast: Secondary | ICD-10-CM

## 2017-02-26 ENCOUNTER — Other Ambulatory Visit: Payer: Self-pay | Admitting: Surgery

## 2017-02-26 DIAGNOSIS — E041 Nontoxic single thyroid nodule: Secondary | ICD-10-CM

## 2017-03-06 ENCOUNTER — Ambulatory Visit
Admission: RE | Admit: 2017-03-06 | Discharge: 2017-03-06 | Disposition: A | Discharge status: Home / Self Care | Payer: BC Managed Care – PPO | Source: Ambulatory Visit | Attending: Surgery | Admitting: Surgery

## 2017-03-06 DIAGNOSIS — E041 Nontoxic single thyroid nodule: Secondary | ICD-10-CM

## 2017-03-12 ENCOUNTER — Ambulatory Visit
Admission: RE | Admit: 2017-03-12 | Discharge: 2017-03-12 | Disposition: A | Discharge status: Home / Self Care | Payer: Medicare Other | Source: Ambulatory Visit | Attending: Family Medicine | Admitting: Family Medicine

## 2017-03-12 DIAGNOSIS — Z1231 Encounter for screening mammogram for malignant neoplasm of breast: Secondary | ICD-10-CM

## 2017-04-19 ENCOUNTER — Other Ambulatory Visit: Payer: Self-pay | Admitting: Family Medicine

## 2017-04-24 ENCOUNTER — Other Ambulatory Visit: Payer: Self-pay | Admitting: Family Medicine

## 2017-04-24 DIAGNOSIS — F411 Generalized anxiety disorder: Secondary | ICD-10-CM

## 2017-04-25 ENCOUNTER — Other Ambulatory Visit: Payer: Self-pay | Admitting: Family Medicine

## 2017-04-25 DIAGNOSIS — F411 Generalized anxiety disorder: Secondary | ICD-10-CM

## 2017-05-24 ENCOUNTER — Other Ambulatory Visit: Payer: Self-pay | Admitting: Family Medicine

## 2017-05-24 DIAGNOSIS — F411 Generalized anxiety disorder: Secondary | ICD-10-CM

## 2017-05-27 NOTE — Telephone Encounter (Signed)
Patient informed and scheduled follow-up for 05/29/17.

## 2017-05-29 ENCOUNTER — Ambulatory Visit: Payer: Medicare Other | Admitting: Family Medicine

## 2017-06-11 NOTE — Progress Notes (Signed)
HPI:   Veronica [name] is a 67 y.o. female, who is here today for 6 months follow up.   She was last seen in 11/2016 for her CPE.  Anxiety, currently she is on Alprazolam 0.25 mg daily as needed and sertraline 50 mg daily.  Sertraline was started in 11/2016, she was recommended to have 6 to 8 weeks follow-up.   She is reporting great improvement in anxiety, better that she did with Fluoxetine. She is taking Alprazolam at bedtime to help her sleep prn.  Negative for depressed mood.     Today she is also complaining of lower back pain radiated to left lower extremity, numbness. She would like to use [employer] transportation, she brought form today. She does not drive because anxiety, working 20 hours per week. She is using public transportation but still from bus stop she has to walk long distance to get to work/home.This is aggravating back pain.   She is following with orthopedist, according to patient surgery was recommended but she is reluctant to doing so. She has received epidural injections but has not helped. Pain is getting worse. Exacerbated by walking and alleviated by rest. It is also worse at night when lying on her back,improves with sleeping on her side.  Occasionally she feels achy sensation on perineal area. She denies saddle anesthesia or urine/bowel incontinence.  She is interested in seeing another provider, DR Wynetta Emery. States that he is treating one of her co-workers,she would like a second opinion.  She is also concerned about wt gain. She is walking to work a few times per week. She has not been consistent with a healthy diet.  HTN: She is on Amlodipine 5 mg daily. Denies severe/frequent headache, visual changes, chest pain, dyspnea, palpitation, claudication, focal weakness, or edema. Lab Results  Component Value Date   CREATININE 0.67 12/26/2016   BUN 11 12/26/2016   NA 139 12/26/2016   K 4.2 12/26/2016   CL 107 12/26/2016   CO2 27  12/26/2016     Review of Systems  Constitutional: Negative for activity change, appetite change and fatigue.  Respiratory: Negative for shortness of breath and wheezing.   Cardiovascular: Negative for chest pain, palpitations and leg swelling.  Gastrointestinal: Negative for abdominal pain, nausea and vomiting.       No changes in bowel habits,  Endocrine: Negative for cold intolerance and heat intolerance.  Genitourinary: Negative for decreased urine volume, dysuria and hematuria.  Musculoskeletal: Positive for back pain. Negative for gait problem.  Skin: Negative for rash.  Neurological: Positive for numbness. Negative for tremors and headaches.  Psychiatric/Behavioral: Negative for confusion, hallucinations, sleep disturbance and suicidal ideas. The patient is nervous/anxious.      Current Outpatient Medications on File Prior to Visit  Medication Sig Dispense Refill  . ALPRAZolam (XANAX) 0.25 MG tablet Take 1 tablet (0.25 mg total) by mouth at bedtime as needed for anxiety. 20 tablet 1  . amLODipine (NORVASC) 5 MG tablet TAKE 1 TABLET(5 MG) BY MOUTH EVERY MORNING 90 tablet 0  . fexofenadine (ALLEGRA ALLERGY) 180 MG tablet Take 1 tablet (180 mg total) by mouth daily. 90 tablet 3  . fluticasone (FLONASE) 50 MCG/ACT nasal spray Place 1 spray into both nostrils 2 (two) times daily. (Patient taking differently: Place 1 spray into both nostrils as needed. ) 16 g 3   No current facility-administered medications on file prior to visit.      Past Medical History:  Diagnosis Date  .  Allergic rhinitis   . Anxiety   . Anxiety and depression   . Constipation, chronic   . Coronary artery disease    06-2010, had CP----> Mild nonobstructive CAD on cardiac CT   . Hemorrhoids   . Hip pain, bilateral   . Hyperlipidemia   . Hypertension   . Leukoplakia of vagina   . Low back pain    w/ left side radiculopathy, s/p injection at L4-L5 forarmen 1/06  . Uterine fibroid    No Known  Allergies  Social History   Socioeconomic History  . Marital status: Divorced    Spouse name: Not on file  . Number of children: 3  . Years of education: Not on file  . Highest education level: Not on file  Occupational History  . 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 Financial resource strain: Not on file  . Food insecurity:    Worry: Not on file    Inability: Not on file  . Transportation needs:    Medical: Not on file    Non-medical: Not on file  Tobacco Use  . Smoking status: Never Smoker  . Smokeless tobacco: Never Used  Substance and Sexual Activity  . Alcohol use: Yes    Comment: rarely  . Drug use: No  . Sexual activity: Never    Birth control/protection: Surgical  Lifestyle  . Physical activity:    Days per week: Not on file    Minutes per session: Not on file  . Stress: Not on file  Relationships  . Social connections:    Talks on phone: Not on file    Gets together: Not on file    Attends religious service: Not on file    Active member of club or organization: Not on file    Attends meetings of clubs or organizations: Not on file    Relationship status: Not on file  Other Topics Concern  . Not on file  Social History Narrative   Divorced, lives w/ 1 of her children   Original from [place], living in [place] since age 72                      Vitals:   06/12/17 1418  BP: 140/82  Pulse: 70  Resp: 12  Temp: 98.3 F (36.8 C)  SpO2: 95%   Body mass index is 36.05 kg/m.  Wt Readings from Last 3 Encounters:  06/12/17 223 lb 6 oz (101.3 kg)  12/26/16 214 lb 2 oz (97.1 kg)  12/17/16 209 lb (94.8 kg)     Physical Exam  Nursing note and vitals reviewed. Constitutional: She is oriented to person, place, and time. She appears well-developed. No distress.  HENT:  Head: Normocephalic.  Mouth/Throat: Oropharynx is clear and moist and mucous membranes are normal.  Eyes: Pupils are  equal, round, and reactive to light. Conjunctivae are normal.  Cardiovascular: Normal rate and regular rhythm.  No murmur heard. Respiratory: Effort normal and breath sounds normal. No respiratory distress.  GI: Soft. There is no tenderness.  Musculoskeletal: She exhibits no edema.       Lumbar back: She exhibits no tenderness and no bony tenderness.  Neurological: She is alert and oriented to person, place, and time. She has normal strength. A sensory deficit (lateral aspect left hip) is present. Gait normal.  Reflex Scores:      Patellar reflexes are 2+ on the right side  and 2+ on the left side. RLS negative bilateral.  Skin: Skin is warm. No rash noted. No erythema.  Psychiatric: Her mood appears anxious. Cognition and memory are normal.  Well-groomed, good eye contact.      ASSESSMENT AND PLAN:   Ms. ASNA MULDROW was seen today for 6 months follow-up.  1. Generalized anxiety disorder  Improved with Sertraline. No changes in current dose. Continue Alprazolam 0.25 mg daily prn. F/U in 5-6 months.  - sertraline (ZOLOFT) 50 MG tablet; TAKE 1 TABLET(50 MG) BY MOUTH DAILY  Dispense: 90 tablet; Refill: 2  2. Essential hypertension  Adequately controlled. No changes in current management. DASH diet recommended. Eye exam recommended annually. F/U in 6 months, before if needed.  3. Lumbar back pain with radiculopathy affecting left lower extremity  She is not sure about name of provider she would like to see, so she will call with information. After discussion of pharmacologic treatment options,she agrees with trying Gabapentin,titrate dose from 100 mg to 300 mg. Some side effects discussed. Continue following with neurosurgeon. Clearly instructed about warning signs.  - gabapentin (NEURONTIN) 100 MG capsule; Take 3 capsules (300 mg total) by mouth at bedtime.  Dispense: 90 capsule; Refill: 1  4. Class 2 obesity due to excess calories without serious comorbidity with body  mass index (BMI) of 36.0 to 36.9 in adult  We discussed benefits of wt loss as well as adverse effects of obesity. Consistency with healthy diet and physical activity recommended.    -Ms. MAHRUKH SEGUIN was advised to return sooner than planned today if new concerns arise.       Betty G. Martinique, MD  [facility]. [place] office.

## 2017-06-12 ENCOUNTER — Encounter: Payer: Self-pay | Admitting: Family Medicine

## 2017-06-12 ENCOUNTER — Ambulatory Visit: Payer: Medicare Other | Admitting: Family Medicine

## 2017-06-12 VITALS — BP 140/82 | HR 70 | Temp 98.3°F | Resp 12 | Ht 66.0 in | Wt 223.4 lb

## 2017-06-12 DIAGNOSIS — Z6836 Body mass index (BMI) 36.0-36.9, adult: Secondary | ICD-10-CM | POA: Diagnosis not present

## 2017-06-12 DIAGNOSIS — E6609 Other obesity due to excess calories: Secondary | ICD-10-CM

## 2017-06-12 DIAGNOSIS — I10 Essential (primary) hypertension: Secondary | ICD-10-CM

## 2017-06-12 DIAGNOSIS — F411 Generalized anxiety disorder: Secondary | ICD-10-CM

## 2017-06-12 DIAGNOSIS — M5416 Radiculopathy, lumbar region: Secondary | ICD-10-CM | POA: Diagnosis not present

## 2017-06-12 MED ORDER — GABAPENTIN 100 MG PO CAPS: 300.0000 mg | ORAL_CAPSULE | Freq: Every day | ORAL | 1 refills | Status: DC

## 2017-06-12 MED ORDER — SERTRALINE HCL 50 MG PO TABS: ORAL_TABLET | ORAL | 2 refills | Status: DC

## 2017-06-12 NOTE — Patient Instructions (Signed)
A few things to remember from today's visit:   Essential hypertension  Generalized anxiety disorder - Plan: sertraline (ZOLOFT) 50 MG tablet  Lumbar back pain with radiculopathy affecting left lower extremity - Plan: gabapentin (NEURONTIN) 100 MG capsule  Please call to let me know which doctor you want to see follow back. Start gabapentin 1 capsule daily, can increase to 2 capsules in 4 days, and then 3 capsules in another 4 days.   Please call and let me know if he is tolerating 3 capsules at bedtime, so we can change to 300 mg capsule.  Please be sure medication list is accurate. If a new problem present, please set up appointment sooner than planned today.

## 2017-06-18 ENCOUNTER — Telehealth: Payer: Self-pay | Admitting: Family Medicine

## 2017-06-18 NOTE — Telephone Encounter (Signed)
Message sent to Dr. Jordan for review. 

## 2017-06-18 NOTE — Telephone Encounter (Signed)
Most insurances do not  require referral to podiatrist in order to see a provider.  She might be able to schedule appointment herself. If her insurance requires referral,it can be placed.  Thanks, BJ

## 2017-06-18 NOTE — Telephone Encounter (Signed)
Left message to give clinic a call back concerning referral. 

## 2017-06-18 NOTE — Telephone Encounter (Signed)
Copied from [place] [id number]. Topic: General - Other >> Jun 18, 2017  9:29 AM Yvette Rack wrote: Reason for CRM: patient calling about a referral for a nail surgeon provider wanted to know the Doctors name Eustace Moore at [address]

## 2017-06-20 NOTE — Telephone Encounter (Signed)
Spoke with patient and gave instructions per Dr. Martinique, patient verbalized understanding.

## 2017-07-23 ENCOUNTER — Other Ambulatory Visit: Payer: Self-pay | Admitting: Family Medicine

## 2017-07-23 NOTE — Telephone Encounter (Signed)
Sent to the pharmacy by e-scribe for 6 months.  Last seen 06/12/17 and advised to follow up in 6 months.  No further action required.

## 2017-08-05 ENCOUNTER — Telehealth: Payer: Self-pay | Admitting: Family Medicine

## 2017-08-05 DIAGNOSIS — Z0279 Encounter for issue of other medical certificate: Secondary | ICD-10-CM

## 2017-08-05 NOTE — Telephone Encounter (Signed)
Copied from [place] [id number]. Topic: Quick Communication - See Telephone Encounter >> Aug 05, 2017 10:06 AM Rutherford Nail, NT wrote: CRM for notification. See Telephone encounter for: 08/05/17. Joann Allred, advocate for patient, calling to inform office that Almedia Balls, coordinator with [place] [employer], will be sending over a fax stating that she needs a diagnosis for the patient's application for the [employer] bus. Would like a response back today, if possible. Please advise.   CB#: [phone]

## 2017-08-05 NOTE — Telephone Encounter (Signed)
Patient brought in papers from [facility] about her sciatic nerves to put with the fax for [employer].   She wants to make sure that [employer] knows the diagnosis.   I have put the papers with the fax and put in the Dr's [facility]

## 2017-08-06 NOTE — Telephone Encounter (Signed)
Form put on doctor's desk for completion.

## 2017-08-09 NOTE — Telephone Encounter (Signed)
Called patient and informed her that her forms were completed by Dr. Martinique and are ready for pick up. Patient has a section that she need to sign and patient aware of charge on forms.

## 2017-10-25 ENCOUNTER — Ambulatory Visit: Payer: Medicare Other | Admitting: Adult Health

## 2017-10-25 ENCOUNTER — Encounter: Payer: Self-pay | Admitting: Adult Health

## 2017-10-25 DIAGNOSIS — J302 Other seasonal allergic rhinitis: Secondary | ICD-10-CM

## 2017-10-25 MED ORDER — FLUTICASONE PROPIONATE 50 MCG/ACT NA SUSP: 2.0000 | Freq: Every day | NASAL | 6 refills | Status: DC

## 2017-10-25 MED ORDER — AZITHROMYCIN 250 MG PO TABS: ORAL_TABLET | ORAL | 0 refills | Status: DC

## 2017-10-25 NOTE — Progress Notes (Signed)
Subjective:    Patient ID: Veronica [name], female    DOB: 05/28/1950, 66 y.o.   MRN: [id number]  URI   This is a new problem. The current episode started yesterday. The problem has been unchanged. There has been no fever. Associated symptoms include headaches, rhinorrhea (clear), sinus pain and sneezing. Pertinent negatives include no congestion, coughing, ear pain, neck pain, plugged ear sensation, sore throat or wheezing. Abdominal pain: Flonase. Treatments tried: claritin  The treatment provided no relief.   Review of Systems  Constitutional: Negative.   HENT: Positive for rhinorrhea (clear), sinus pain and sneezing. Negative for congestion, ear pain and sore throat.   Eyes: Positive for discharge (clear) and itching.  Respiratory: Negative for cough and wheezing.   Cardiovascular: Negative.   Gastrointestinal: Abdominal pain: Flonase.  Musculoskeletal: Negative for neck pain.  Neurological: Positive for headaches.   Past Medical History:  Diagnosis Date  . Allergic rhinitis   . Anxiety   . Anxiety and depression   . Constipation, chronic   . Coronary artery disease    06-2010, had CP----> Mild nonobstructive CAD on cardiac CT   . Hemorrhoids   . Hip pain, bilateral   . Hyperlipidemia   . Hypertension   . Leukoplakia of vagina   . Low back pain    w/ left side radiculopathy, s/p injection at L4-L5 forarmen 1/06  . Uterine fibroid     Social History   Socioeconomic History  . Marital status: Divorced    Spouse name: Not on file  . Number of children: 3  . Years of education: Not on file  . Highest education level: Not on file  Occupational History  . 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 Financial resource strain: Not on file  . Food insecurity:    Worry: Not on file    Inability: Not on file  . Transportation needs:    Medical: Not on file    Non-medical: Not on file  Tobacco Use  . Smoking  status: Never Smoker  . Smokeless tobacco: Never Used  Substance and Sexual Activity  . Alcohol use: Yes    Comment: rarely  . Drug use: No  . Sexual activity: Never    Birth control/protection: Surgical  Lifestyle  . Physical activity:    Days per week: Not on file    Minutes per session: Not on file  . Stress: Not on file  Relationships  . Social connections:    Talks on phone: Not on file    Gets together: Not on file    Attends religious service: Not on file    Active member of club or organization: Not on file    Attends meetings of clubs or organizations: Not on file    Relationship status: Not on file  . Intimate partner violence:    Fear of current or ex partner: Not on file    Emotionally abused: Not on file    Physically abused: Not on file    Forced sexual activity: Not on file  Other Topics Concern  . Not on file  Social History Narrative   Divorced, lives w/ 1 of her children   Original from [place], living in [place] since age 3                      Past Surgical History:  Procedure Laterality Date  . ABDOMINAL HYSTERECTOMY  11-2000   no oophorectomy per pt  . CHOLECYSTECTOMY  12/08/2010   Procedure: LAPAROSCOPIC CHOLECYSTECTOMY WITH INTRAOPERATIVE CHOLANGIOGRAM;  Surgeon: Pedro Earls, MD;  Location: [facility];  Service: General;  Laterality: N/A;  c-arm   . COLONOSCOPY WITH PROPOFOL N/A 02/02/2014   Procedure: COLONOSCOPY WITH PROPOFOL;  Surgeon: Garlan Fair, MD;  Location: [facility];  Service: Endoscopy;  Laterality: N/A;  . LEEP     unknown date   . TUBAL LIGATION     bilateral 1989    Family History  Problem Relation Age of Onset  . Schizophrenia Son   . Schizophrenia Brother   . Schizophrenia Other        2 nieces  . Diabetes Neg Hx   . Heart attack Neg Hx   . Colon cancer Neg Hx   . Breast cancer Neg Hx     No Known Allergies  Current Outpatient Medications on File Prior to Visit  Medication Sig Dispense Refill  . amLODipine  (NORVASC) 5 MG tablet TAKE 1 TABLET(5 MG) BY MOUTH EVERY MORNING 90 tablet 1  . fexofenadine (ALLEGRA ALLERGY) 180 MG tablet Take 1 tablet (180 mg total) by mouth daily. 90 tablet 3  . fluticasone (FLONASE) 50 MCG/ACT nasal spray Place 1 spray into both nostrils 2 (two) times daily. (Patient taking differently: Place 1 spray into both nostrils as needed. ) 16 g 3  . gabapentin (NEURONTIN) 100 MG capsule Take 3 capsules (300 mg total) by mouth at bedtime. 90 capsule 1  . sertraline (ZOLOFT) 50 MG tablet TAKE 1 TABLET(50 MG) BY MOUTH DAILY 90 tablet 2   No current facility-administered medications on file prior to visit.     There were no vitals taken for this visit.      Objective:   Physical Exam  Constitutional: She is oriented to person, place, and time. She appears well-developed and well-nourished.  HENT:  Right Ear: Hearing, tympanic membrane, external ear and ear canal normal.  Left Ear: Hearing, tympanic membrane, external ear and ear canal normal.  Nose: Rhinorrhea present. No mucosal edema. Right sinus exhibits maxillary sinus tenderness and frontal sinus tenderness. Left sinus exhibits maxillary sinus tenderness and frontal sinus tenderness.  Mouth/Throat: Uvula is midline, oropharynx is clear and moist and mucous membranes are normal.  erythematous nasal turbinates  Eyes: Pupils are equal, round, and reactive to light. Conjunctivae and EOM are normal.  Cardiovascular: Normal rate, regular rhythm, normal heart sounds and intact distal pulses.  Pulmonary/Chest: Effort normal and breath sounds normal.  Neurological: She is alert and oriented to person, place, and time.  Skin: Skin is warm and dry.  Nursing note and vitals reviewed.     Assessment & Plan:  1. Seasonal allergies - Exam consistent with seasonal allergies.  - Will send in [place] paper prescription for Z pack going into the weekend. If her symptoms last longer than 7 days or she develops a fever ok to  take abx.  - Follow up as needed  Dorothyann Peng, NP

## 2018-01-03 ENCOUNTER — Encounter: Payer: Medicare Other | Admitting: Family Medicine

## 2018-01-06 ENCOUNTER — Ambulatory Visit (INDEPENDENT_AMBULATORY_CARE_PROVIDER_SITE_OTHER): Payer: Medicare Other | Admitting: Family Medicine

## 2018-01-06 ENCOUNTER — Encounter: Payer: Self-pay | Admitting: Family Medicine

## 2018-01-06 VITALS — BP 130/75 | HR 64 | Temp 97.7°F | Resp 12 | Ht 66.0 in | Wt 229.0 lb

## 2018-01-06 DIAGNOSIS — Z0000 Encounter for general adult medical examination without abnormal findings: Secondary | ICD-10-CM | POA: Diagnosis not present

## 2018-01-06 DIAGNOSIS — E042 Nontoxic multinodular goiter: Secondary | ICD-10-CM | POA: Diagnosis not present

## 2018-01-06 DIAGNOSIS — Z130 Encounter for screening for diseases of the blood and blood-forming organs and certain disorders involving the immune mechanism: Secondary | ICD-10-CM

## 2018-01-06 DIAGNOSIS — F411 Generalized anxiety disorder: Secondary | ICD-10-CM | POA: Diagnosis not present

## 2018-01-06 DIAGNOSIS — I10 Essential (primary) hypertension: Secondary | ICD-10-CM | POA: Diagnosis not present

## 2018-01-06 DIAGNOSIS — E785 Hyperlipidemia, unspecified: Secondary | ICD-10-CM | POA: Diagnosis not present

## 2018-01-06 DIAGNOSIS — Z23 Encounter for immunization: Secondary | ICD-10-CM | POA: Diagnosis not present

## 2018-01-06 DIAGNOSIS — Z780 Asymptomatic menopausal state: Secondary | ICD-10-CM

## 2018-01-06 DIAGNOSIS — Z13228 Encounter for screening for other metabolic disorders: Secondary | ICD-10-CM | POA: Diagnosis not present

## 2018-01-06 DIAGNOSIS — Z1329 Encounter for screening for other suspected endocrine disorder: Secondary | ICD-10-CM

## 2018-01-06 LAB — COMPREHENSIVE METABOLIC PANEL
ALBUMIN: 4.4 g/dL (ref 3.5–5.2)
ALK PHOS: 66 U/L (ref 39–117)
ALT: 12 U/L (ref 0–35)
AST: 15 U/L (ref 0–37)
BILIRUBIN TOTAL: 0.6 mg/dL (ref 0.2–1.2)
BUN: 15 mg/dL (ref 6–23)
CALCIUM: 9.3 mg/dL (ref 8.4–10.5)
CO2: 22 meq/L (ref 19–32)
CREATININE: 0.68 mg/dL (ref 0.40–1.20)
Chloride: 105 mEq/L (ref 96–112)
GFR: 92.05 mL/min (ref >60.00)
Glucose, Bld: 91 mg/dL (ref 70–99)
Potassium: 3.9 mEq/L (ref 3.5–5.1)
Sodium: 137 mEq/L (ref 135–145)
Total Protein: 7.2 g/dL (ref 6.0–8.3)

## 2018-01-06 LAB — LIPID PANEL
CHOLESTEROL: 213 mg/dL — AB (ref 0–200)
HDL: 45.5 mg/dL (ref >39.00)
LDL CALC: 145 mg/dL — AB (ref 0–99)
NonHDL: 167.49
TRIGLYCERIDES: 111 mg/dL (ref 0.0–149.0)
Total CHOL/HDL Ratio: 5
VLDL: 22.2 mg/dL (ref 0.0–40.0)

## 2018-01-06 LAB — TSH: TSH: 0.88 u[IU]/mL (ref 0.35–4.50)

## 2018-01-06 MED ORDER — PNEUMOCOCCAL 13-VAL CONJ VACC IM SUSP
0.5000 mL | INTRAMUSCULAR | Status: AC
  Administered 2018-01-06: 0.5 mL via INTRAMUSCULAR

## 2018-01-06 NOTE — Progress Notes (Signed)
HPI:   Veronica [name] is a 67 y.o. female, who is here today for her routine physical.  Last CPE: 12/27/15  Regular exercise 3 or more time per week: Walking 30 min 3 times per week and swimming. Following a healthy diet: Not consistently, she loves bread.  She lives alone. Her son lives in town, she baby sits her 3 grandchildren a few times per week.  She works part time. She does not drive because she is afraid to do so. She uses the scat for transportation.  Chronic medical problems: Anxiety, insomnia, hypertension, allergies,thyroid nodules,hyperlipidemia, and chronic back pain ambulance home.  She has not followed with Dr Buddy Duty in almost a year,she wants thyroid function test done today. HTN,she does not check BP regularly. She is on  HLD on non pharmacologic treatment.She has not been consistent with low fat diet.   Pap smear status post hysterectomy due to fibroids.  According to patient pathology did not show malignancy. Hx of abnormal pap smears: LEEP done in the 1980s.   Immunization History  Administered Date(s) Administered  . Hepatitis A, Adult 10/26/2015  . Influenza Whole 10/07/2009  . MMR 10/26/2015  . Pneumococcal Conjugate-13 01/06/2018  . Tdap 10/24/2014  . Zoster 09/01/2012    Mammogram: 03/12/2017 Bi-Rads 1 Colonoscopy: 01/2014, due in 2021 DEXA: 2013, normal  Hep C screening: 11/2016.  She has no concerns today.  Independent ADL's and IADL's.  Functional Status Survey: Is the patient deaf or have difficulty hearing?: Yes(sometimes) Does the patient have difficulty seeing, even when wearing glasses/contacts?: No Does the patient have difficulty concentrating, remembering, or making decisions?: No Does the patient have difficulty walking or climbing stairs?: No Does the patient have difficulty dressing or bathing?: No Does the patient have difficulty doing errands alone such as visiting a doctor's office or shopping?:  No  Occasionally she feels like her balance is "off", attributed to back pain with radiculopathy. She is not longer following with ortho,she is not interested in  Surgical procedure. She is   Fall Risk  01/06/2018  Falls in the past year? 0  Number falls in past yr: 0  Injury with Fall? 0  Follow up Education provided    Providers she sees regularly:  Eye care provider: Dr Peter,optometrist. She had her last eye exam a year ago.   Depression screen [facility] 2/9 01/06/2018  Decreased Interest 0  Down, Depressed, Hopeless 0  PHQ - 2 Score 0    Mini-Cog - 01/06/18 0750    Normal clock drawing test?  yes    How many words correct?  3        Hearing Screening   '125Hz'$  '250Hz'$  '500Hz'$  '1000Hz'$  '2000Hz'$  '3000Hz'$  '4000Hz'$  '6000Hz'$  '8000Hz'$   Right ear:   Pass Pass Pass  Pass    Left ear:   Pass Pass Pass  Pass      Visual Acuity Screening   Right eye Left eye Both eyes  Without correction:     With correction: 20/25 20/200 20/20     Review of Systems  Constitutional: Negative for appetite change, fatigue and fever.  HENT: Negative for dental problem, hearing loss, mouth sores, sore throat and trouble swallowing.   Eyes: Negative for redness and visual disturbance.  Respiratory: Negative for cough, shortness of breath and wheezing.   Cardiovascular: Negative for chest pain and leg swelling.  Gastrointestinal: Negative for abdominal pain, nausea and vomiting.       No changes in bowel habits.  Endocrine: Negative for cold intolerance, heat intolerance, polydipsia, polyphagia and polyuria.  Genitourinary: Negative for decreased urine volume, dysuria, hematuria, vaginal bleeding and vaginal discharge.  Musculoskeletal: Positive for arthralgias and back pain. Negative for gait problem.  Skin: Negative for color change and rash.  Allergic/Immunologic: Positive for environmental allergies.  Neurological: Negative for syncope, weakness and headaches.  Hematological: Negative for adenopathy. Does not  bruise/bleed easily.  Psychiatric/Behavioral: Negative for confusion and sleep disturbance. The patient is nervous/anxious.   All other systems reviewed and are negative.     Current Outpatient Medications on File Prior to Visit  Medication Sig Dispense Refill  . amLODipine (NORVASC) 5 MG tablet TAKE 1 TABLET(5 MG) BY MOUTH EVERY MORNING 90 tablet 1  . fexofenadine (ALLEGRA ALLERGY) 180 MG tablet Take 1 tablet (180 mg total) by mouth daily. 90 tablet 3  . fluticasone (FLONASE) 50 MCG/ACT nasal spray Place 1 spray into both nostrils 2 (two) times daily. (Patient taking differently: Place 1 spray into both nostrils as needed. ) 16 g 3  . fluticasone (FLONASE) 50 MCG/ACT nasal spray Place 2 sprays into both nostrils daily. 16 g 6  . gabapentin (NEURONTIN) 100 MG capsule Take 3 capsules (300 mg total) by mouth at bedtime. 90 capsule 1  . sertraline (ZOLOFT) 50 MG tablet TAKE 1 TABLET(50 MG) BY MOUTH DAILY 90 tablet 2   No current facility-administered medications on file prior to visit.      Past Medical History:  Diagnosis Date  . Allergic rhinitis   . Anxiety   . Anxiety and depression   . Constipation, chronic   . Coronary artery disease    06-2010, had CP----> Mild nonobstructive CAD on cardiac CT   . Hemorrhoids   . Hip pain, bilateral   . Hyperlipidemia   . Hypertension   . Leukoplakia of vagina   . Low back pain    w/ left side radiculopathy, s/p injection at L4-L5 forarmen 1/06  . Uterine fibroid     Past Surgical History:  Procedure Laterality Date  . ABDOMINAL HYSTERECTOMY  11-2000   no oophorectomy per pt  . CHOLECYSTECTOMY  12/08/2010   Procedure: LAPAROSCOPIC CHOLECYSTECTOMY WITH INTRAOPERATIVE CHOLANGIOGRAM;  Surgeon: Pedro Earls, MD;  Location: [facility];  Service: General;  Laterality: N/A;  c-arm   . COLONOSCOPY WITH PROPOFOL N/A 02/02/2014   Procedure: COLONOSCOPY WITH PROPOFOL;  Surgeon: Garlan Fair, MD;  Location: [facility];  Service: Endoscopy;   Laterality: N/A;  . LEEP     unknown date   . TUBAL LIGATION     bilateral 1989    No Known Allergies  Family History  Problem Relation Age of Onset  . Schizophrenia Son   . Schizophrenia Brother   . Schizophrenia Other        2 nieces  . Diabetes Neg Hx   . Heart attack Neg Hx   . Colon cancer Neg Hx   . Breast cancer Neg Hx     Social History   Socioeconomic History  . Marital status: Divorced    Spouse name: Not on file  . Number of children: 3  . Years of education: Not on file  . Highest education level: Not on file  Occupational History  . 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 Financial resource strain: Not on file  . Food insecurity:    Worry: Not on file    Inability: Not on file  .  Transportation needs:    Medical: Not on file    Non-medical: Not on file  Tobacco Use  . Smoking status: Never Smoker  . Smokeless tobacco: Never Used  Substance and Sexual Activity  . Alcohol use: Yes    Comment: rarely  . Drug use: No  . Sexual activity: Never    Birth control/protection: Surgical  Lifestyle  . Physical activity:    Days per week: Not on file    Minutes per session: Not on file  . Stress: Not on file  Relationships  . Social connections:    Talks on phone: Not on file    Gets together: Not on file    Attends religious service: Not on file    Active member of club or organization: Not on file    Attends meetings of clubs or organizations: Not on file    Relationship status: Not on file  Other Topics Concern  . Not on file  Social History Narrative   Divorced, lives w/ 1 of her children   Original from [place], living in [place] since age 36                       Vitals:   01/06/18 0715  BP: 130/75  Pulse: 64  Resp: 12  Temp: 97.7 F (36.5 C)  SpO2: 97%   Body mass index is 36.96 kg/m.   Wt Readings from Last 3 Encounters:  01/06/18 229 lb (103.9 kg)  06/12/17 223  lb 6 oz (101.3 kg)  12/26/16 214 lb 2 oz (97.1 kg)    Physical Exam  Nursing note and vitals reviewed. Constitutional: She is oriented to person, place, and time. She appears well-developed. No distress.  HENT:  Head: Normocephalic and atraumatic.  Right Ear: Hearing, tympanic membrane, external ear and ear canal normal.  Left Ear: Hearing, tympanic membrane, external ear and ear canal normal.  Mouth/Throat: Uvula is midline, oropharynx is clear and moist and mucous membranes are normal.  Eyes: Pupils are equal, round, and reactive to light. Conjunctivae and EOM are normal.  Neck: No tracheal deviation present. Thyromegaly present.  Cardiovascular: Normal rate and regular rhythm.  No murmur heard. Pulses:      Dorsalis pedis pulses are 2+ on the right side, and 2+ on the left side.  Respiratory: Effort normal and breath sounds normal. No respiratory distress.  GI: Soft. She exhibits no mass. There is no hepatomegaly. There is no tenderness.  Genitourinary:  Genitourinary Comments: Breast: No masses, skin abnormalities, or nipple discharge appreciated bilateral.  Musculoskeletal: She exhibits no edema.  No major deformity or signs of synovitis appreciated.  Lymphadenopathy:    She has no cervical adenopathy.       Right: No supraclavicular adenopathy present.       Left: No supraclavicular adenopathy present.  Neurological: She is alert and oriented to person, place, and time. She has normal strength. No cranial nerve deficit. Coordination and gait normal.  Reflex Scores:      Bicep reflexes are 2+ on the right side and 2+ on the left side.      Patellar reflexes are 2+ on the right side and 2+ on the left side. Skin: Skin is warm. No rash noted. No erythema.  Psychiatric: She has a normal mood and affect. Cognition and memory are normal.  Well groomed, good eye contact.      ASSESSMENT AND PLAN:  Veronica [name] was here today annual physical examination.  Orders  Placed This Encounter  Procedures  . DG Bone Density  . Comprehensive metabolic panel  . TSH  . Lipid panel   Lab Results  Component Value Date   TSH 0.88 01/06/2018   Lab Results  Component Value Date   ALT 12 01/06/2018   AST 15 01/06/2018   ALKPHOS 66 01/06/2018   BILITOT 0.6 01/06/2018   Lab Results  Component Value Date   CREATININE 0.68 01/06/2018   BUN 15 01/06/2018   NA 137 01/06/2018   K 3.9 01/06/2018   CL 105 01/06/2018   CO2 22 01/06/2018   Lab Results  Component Value Date   CHOL 213 (H) 01/06/2018   HDL 45.50 01/06/2018   LDLCALC 145 (H) 01/06/2018   LDLDIRECT 161.6 06/03/2012   TRIG 111.0 01/06/2018   CHOLHDL 5 01/06/2018     Welcome to Medicare preventive visit We discussed the importance of staying active, physically and mentally, as well as the benefits of a healthy/balance diet. Low impact exercise that involve stretching and strengthing are ideal.  We discussed preventive screening for the next 5-10 years, summery of recommendations given in AVS:   Colonoscopy due in 2021. Eye exam and glaucoma screening q 1-2 years Annual diabetes screening. Fall prevention.  Advance directives and end of life discussed and encouraged. Advance directives package given.    Routine general medical examination at a health care facility We discussed the importance of regular physical activity and healthy diet for prevention of chronic illness and/or complications. Preventive guidelines reviewed. Vaccination updated. Refused flu vaccine.  Ca++ and vit D supplementation recommended. Next CPE in a year.  The 10-year ASCVD risk score Mikey Bussing DC Brooke Bonito., et al., 2013) is: 8.6%   Values used to calculate the score:     Age: 42 years     Sex: Female     Is Non-Hispanic African American: No     Diabetic: No     Tobacco smoker: No     Systolic Blood Pressure: 378 mmHg     Is BP treated: Yes     HDL Cholesterol: 45.5 mg/dL     Total Cholesterol: 213  mg/dL  Dyslipidemia Low fat diet recommended for now. Further recommendations will be given according to lipid panel results.  -     Lipid panel  Essential hypertension Adequately controlled. No changes in current management. DASH diet recommended. Eye exam recommended annually. F/U in 6 months, before if needed.  -     Comprehensive metabolic panel  Generalized anxiety disorder Stable. No changes in Sertraline 50 mg daily. F/U in 6 months.  Multiple thyroid nodules Stable. She has not followed with endocrinologist as recommended. Further recommendations according TSH results.  -     TSH  Screening for endocrine, metabolic and immunity disorder -     Comprehensive metabolic panel  Asymptomatic postmenopausal estrogen deficiency -     DG Bone Density; Future  Need for pneumococcal vaccine -     pneumococcal 13-valent conjugate vaccine (PREVNAR 13) injection 0.5 mL     Return in 6 months (on 07/08/2018) for HTN,anxiety.       Dustina Scoggin G. [place], MD  [facility]. [place] office.

## 2018-01-06 NOTE — Patient Instructions (Addendum)
A few things to remember from today's visit:   Welcome to Medicare preventive visit  Dyslipidemia - Plan: Lipid panel  Essential hypertension - Plan: Comprehensive metabolic panel  Generalized anxiety disorder  Multiple thyroid nodules - Plan: TSH  Routine general medical examination at a health care facility  Screening for endocrine, metabolic and immunity disorder - Plan: Comprehensive metabolic panel  Asymptomatic postmenopausal estrogen deficiency - Plan: DG Bone Density   A few tips:  -As we age balance is not as good as it was, so there is a higher risks for falls. Please remove small rugs and furniture that is "in your way" and could increase the risk of falls. Stretching exercises may help with fall prevention: Yoga and Tai Chi are some examples. Low impact exercise is better, so you are not very achy the next day.  -Sun screen and avoidance of direct sun light recommended. Caution with dehydration, if working outdoors be sure to drink enough fluids.  - Some medications are not safe as we age, increases the risk of side effects and can potentially interact with other medication you are also taken;  including some of over the counter medications. Be sure to let me know when you start a new medication even if it is a dietary/vitamin supplement.   -Healthy diet low in red meet/animal fat and sugar + regular physical activity is recommended.       Screening schedule for the next 5-10 years:  Colonoscopy due in 2021  Glaucoma screening/eye exam every 1-2 years.  Mammogram for breast cancer screening annually.  Flu vaccine annually.  Diabetes screening every 1-3 years DEXA ordered. Prevnar 13 today and next year pneumovax. Fall prevention   Advance directives:  Please see a lawyer and/or go to this website to help you with advanced directives and designating a health care power of attorney so that your wishes will be followed should you become too ill to make  your own medical decisions.  [email or web address]       Please be sure medication list is accurate. If a new problem present, please set up appointment sooner than planned today.

## 2018-01-09 ENCOUNTER — Encounter: Payer: Self-pay | Admitting: Family Medicine

## 2018-01-14 ENCOUNTER — Other Ambulatory Visit: Payer: Self-pay | Admitting: *Deleted

## 2018-01-14 MED ORDER — LOVASTATIN 20 MG PO TABS: 20.0000 mg | ORAL_TABLET | Freq: Every day | ORAL | 3 refills | Status: DC

## 2018-02-19 ENCOUNTER — Ambulatory Visit: Payer: Medicare Other | Admitting: Family Medicine

## 2018-02-19 ENCOUNTER — Encounter: Payer: Self-pay | Admitting: Family Medicine

## 2018-02-19 VITALS — BP 118/60 | HR 71 | Temp 98.4°F | Resp 12 | Ht 66.0 in | Wt 228.1 lb

## 2018-02-19 DIAGNOSIS — E042 Nontoxic multinodular goiter: Secondary | ICD-10-CM

## 2018-02-19 DIAGNOSIS — M25561 Pain in right knee: Secondary | ICD-10-CM

## 2018-02-19 DIAGNOSIS — E559 Vitamin D deficiency, unspecified: Secondary | ICD-10-CM | POA: Diagnosis not present

## 2018-02-19 DIAGNOSIS — M2550 Pain in unspecified joint: Secondary | ICD-10-CM | POA: Insufficient documentation

## 2018-02-19 DIAGNOSIS — Z6836 Body mass index (BMI) 36.0-36.9, adult: Secondary | ICD-10-CM

## 2018-02-19 LAB — SEDIMENTATION RATE: Sed Rate: 14 mm/hr (ref 0–30)

## 2018-02-19 LAB — C-REACTIVE PROTEIN: CRP: 0.3 mg/dL — AB (ref 0.5–20.0)

## 2018-02-19 LAB — VITAMIN D 25 HYDROXY (VIT D DEFICIENCY, FRACTURES): VITD: 33.4 ng/mL (ref 30.00–100.00)

## 2018-02-19 NOTE — Assessment & Plan Note (Signed)
We discussed benefits of wt loss as well as adverse effects of obesity. Consistency with healthy diet and physical activity recommended.  

## 2018-02-19 NOTE — Assessment & Plan Note (Signed)
Will review recent TSH results as well as last thyroid US. No further follow-up with thyroid US is recommended. If she is concerned about thyroid nodules and/or function despite of recent lab/imaging results, recommend following with endocrinologist (as recommended last visit).

## 2018-02-19 NOTE — Patient Instructions (Addendum)
A few things to remember from today's visit:   Multiple thyroid nodules  Vitamin D deficiency, unspecified - Plan: VITAMIN D 25 Hydroxy (Vit-D Deficiency, Fractures)  Class 2 severe obesity due to excess calories with serious comorbidity and body mass index (BMI) of 36.0 to 36.9 in adult [facility])  Polyarthralgia - Plan: C-reactive protein, Sedimentation rate  Again noted is a hypoechoic nodule in the superior left thyroid lobe measuring up to 0.8 x 0.4 x 0.5 cm and previously measured 0.6 cm in the transverse dimension. This nodule does not meet criteria for biopsy or dedicated follow-up. Please be sure medication list is accurate. If a new problem present, please set up appointment sooner than planned today.    Osteoarthritis  Osteoarthritis is a type of arthritis that affects tissue that covers the ends of bones in joints (cartilage). Cartilage acts as a cushion between the bones and helps them move smoothly. Osteoarthritis results when cartilage in the joints gets worn down. Osteoarthritis is sometimes called "wear and tear" arthritis. Osteoarthritis is the most common form of arthritis. It often occurs in older people. It is a condition that gets worse over time (a progressive condition). Joints that are most often affected by this condition are in:  Fingers.  Toes.  Hips.  Knees.  Spine, including neck and lower back. What are the causes? This condition is caused by age-related wearing down of cartilage that covers the ends of bones. What increases the risk? The following factors may make you more likely to develop this condition:  Older age.  Being overweight or obese.  Overuse of joints, such as in athletes.  Past injury of a joint.  Past surgery on a joint.  Family history of osteoarthritis. What are the signs or symptoms? The main symptoms of this condition are pain, swelling, and stiffness in the joint. The joint may lose its shape over time. Small pieces of  bone or cartilage may break off and float inside of the joint, which may cause more pain and damage to the joint. Small deposits of bone (osteophytes) may grow on the edges of the joint. Other symptoms may include:  A grating or scraping feeling inside the joint when you move it.  Popping or creaking sounds when you move. Symptoms may affect one or more joints. Osteoarthritis in a major joint, such as your knee or hip, can make it painful to walk or exercise. If you have osteoarthritis in your hands, you might not be able to grip items, twist your hand, or control small movements of your hands and fingers (fine motor skills). How is this diagnosed? This condition may be diagnosed based on:  Your medical history.  A physical exam.  Your symptoms.  X-rays of the affected joint(s).  Blood tests to rule out other types of arthritis. How is this treated? There is no cure for this condition, but treatment can help to control pain and improve joint function. Treatment plans may include:  A prescribed exercise program that allows for rest and joint relief. You may work with a physical therapist.  A weight control plan.  Pain relief techniques, such as: ? Applying heat and cold to the joint. ? Electric pulses delivered to nerve endings under the skin (transcutaneous electrical nerve stimulation, or TENS). ? Massage. ? Certain nutritional supplements.  NSAIDs or prescription medicines to help relieve pain.  Medicine to help relieve pain and inflammation (corticosteroids). This can be given by mouth (orally) or as an injection.  Assistive devices,  such as a brace, wrap, splint, specialized glove, or cane.  Surgery, such as: ? An osteotomy. This is done to reposition the bones and relieve pain or to remove loose pieces of bone and cartilage. ? Joint replacement surgery. You may need this surgery if you have very bad (advanced) osteoarthritis. Follow these instructions at  home: Activity  Rest your affected joints as directed by your health care provider.  Do not drive or use heavy machinery while taking prescription pain medicine.  Exercise as directed. Your health care provider or physical therapist may recommend specific types of exercise, such as: ? Strengthening exercises. These are done to strengthen the muscles that support joints that are affected by arthritis. They can be performed with weights or with exercise bands to add resistance. ? Aerobic activities. These are exercises, such as brisk walking or water aerobics, that get your heart pumping. ? Range-of-motion activities. These keep your joints easy to move. ? Balance and agility exercises. Managing pain, stiffness, and swelling      If directed, apply heat to the affected area as often as told by your health care provider. Use the heat source that your health care provider recommends, such as a moist heat pack or a heating pad. ? If you have a removable assistive device, remove it as told by your health care provider. ? Place a towel between your skin and the heat source. If your health care provider tells you to keep the assistive device on while you apply heat, place a towel between the assistive device and the heat source. ? Leave the heat on for 20-30 minutes. ? Remove the heat if your skin turns bright red. This is especially important if you are unable to feel pain, heat, or cold. You may have a greater risk of getting burned.  If directed, put ice on the affected joint: ? If you have a removable assistive device, remove it as told by your health care provider. ? Put ice in a plastic bag. ? Place a towel between your skin and the bag. If your health care provider tells you to keep the assistive device on during icing, place a towel between the assistive device and the bag. ? Leave the ice on for 20 minutes, 2-3 times a day. General instructions  Take over-the-counter and prescription  medicines only as told by your health care provider.  Maintain a healthy weight. Follow instructions from your health care provider for weight control. These may include dietary restrictions.  Do not use any products that contain nicotine or tobacco, such as cigarettes and e-cigarettes. These can delay bone healing. If you need help quitting, ask your health care provider.  Use assistive devices as directed by your health care provider.  Keep all follow-up visits as told by your health care provider. This is important. Where to find more information  [employer] of Arthritis and Musculoskeletal and Skin Diseases: www.niams.SouthExposed.es  [employer] on Aging: http://kim-miller.com/  American College of Rheumatology: www.rheumatology.org Contact a health care provider if:  Your skin turns red.  You develop a rash.  You have pain that gets worse.  You have a fever along with joint or muscle aches. Get help right away if:  You lose a lot of weight.  You suddenly lose your appetite.  You have night sweats. Summary  Osteoarthritis is a type of arthritis that affects tissue covering the ends of bones in joints (cartilage).  This condition is caused by age-related wearing down of cartilage  that covers the ends of bones.  The main symptom of this condition is pain, swelling, and stiffness in the joint.  There is no cure for this condition, but treatment can help to control pain and improve joint function. This information is not intended to replace advice given to you by your health care provider. Make sure you discuss any questions you have with your health care provider. Document Released: 01/15/2005 Document Revised: 10/22/2016 Document Reviewed: 09/19/2015 Elsevier Interactive Patient Education  2019 [employer].

## 2018-02-19 NOTE — Progress Notes (Signed)
ACUTE VISIT   HPI:  Chief Complaint  Patient presents with  . Right leg pain    pain in joints of knee area x 1 week    Veronica [name] is a 68 y.o. female, who is here today complaining of a weeks right knee pain, intermittently, sharp, 7/10. It is exacerbated by palpation and at night when sleeping on her side, knees are in contact. Yesterday she applied topical medication she uses for back pain and it seemed to help. No history of trauma.  No Hx of trauma. She has not noted knee edema or erythema. No limitation of ROM.  She is also having intermittent pain of elbows,left knee, hips, ankles,MCP,PIP joint. And pretibial myalgias.  History of lower back pain, which has improved since [place]. Morning stiffness. She has not noted joint erythema or edema.  She would like vit D check,reports Hx of vit D deficiency. She is not on vit D supplementation.  She is also concerned about weight gain, she wonders if this is related with her thyroid problems. She wonders if she needs to go back to endocrinologist, who recommended to follow annually but she did not think it was necessary.  She has history of thyroid nodules, she wonders if she needs another thyroid US.  Thyroid US last done on 03/07/17: Multinodular thyroid again demonstrated. The right thyroid nodule labeled 1 has been previously biopsied with [facility] category 2 pathology. No other thyroid nodule meets criteria for biopsy or surveillance, as designated by the newly established ACR TI-RADS criteria. Lab Results  Component Value Date   TSH 0.88 01/06/2018    She has not been consistent with a healthy diet, high in carbs intake. She just started exercising a few days ago,walks around the gym 2 times per week.   Review of Systems  Constitutional: Negative for activity change, appetite change, fatigue and fever.  HENT: Negative for mouth sores, nosebleeds, sore throat and trouble  swallowing.   Respiratory: Negative for shortness of breath and wheezing.   Cardiovascular: Negative for chest pain, palpitations and leg swelling.  Gastrointestinal: Negative for abdominal pain, nausea and vomiting.       Negative for changes in bowel habits.  Endocrine: Negative for cold intolerance and heat intolerance.  Genitourinary: Negative for decreased urine volume and hematuria.  Musculoskeletal: Positive for arthralgias and back pain. Negative for gait problem and joint swelling.  Skin: Negative for pallor and rash.  Neurological: Negative for syncope, weakness and numbness.  Psychiatric/Behavioral: Negative for confusion. The patient is nervous/anxious.       Current Outpatient Medications on File Prior to Visit  Medication Sig Dispense Refill  . amLODipine (NORVASC) 5 MG tablet TAKE 1 TABLET(5 MG) BY MOUTH EVERY MORNING 90 tablet 1  . fexofenadine (ALLEGRA ALLERGY) 180 MG tablet Take 1 tablet (180 mg total) by mouth daily. 90 tablet 3  . fluticasone (FLONASE) 50 MCG/ACT nasal spray Place 1 spray into both nostrils 2 (two) times daily. (Patient taking differently: Place 1 spray into both nostrils as needed. ) 16 g 3  . gabapentin (NEURONTIN) 100 MG capsule Take 3 capsules (300 mg total) by mouth at bedtime. 90 capsule 1  . lovastatin (MEVACOR) 20 MG tablet Take 1 tablet (20 mg total) by mouth at bedtime. 90 tablet 3  . sertraline (ZOLOFT) 50 MG tablet TAKE 1 TABLET(50 MG) BY MOUTH DAILY 90 tablet 2   No current facility-administered medications on file prior to  visit.      Past Medical History:  Diagnosis Date  . Allergic rhinitis   . Anxiety   . Anxiety and depression   . Constipation, chronic   . Coronary artery disease    06-2010, had CP----> Mild nonobstructive CAD on cardiac CT   . Hemorrhoids   . Hip pain, bilateral   . Hyperlipidemia   . Hypertension   . Leukoplakia of vagina   . Low back pain    w/ left side radiculopathy, s/p injection at L4-L5 forarmen  1/06  . Uterine fibroid    No Known Allergies  Social History   Socioeconomic History  . Marital status: Divorced    Spouse name: Not on file  . Number of children: 3  . Years of education: Not on file  . Highest education level: Not on file  Occupational History  . 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 Financial resource strain: Not on file  . Food insecurity:    Worry: Not on file    Inability: Not on file  . Transportation needs:    Medical: Not on file    Non-medical: Not on file  Tobacco Use  . Smoking status: Never Smoker  . Smokeless tobacco: Never Used  Substance and Sexual Activity  . Alcohol use: Yes    Comment: rarely  . Drug use: No  . Sexual activity: Never    Birth control/protection: Surgical  Lifestyle  . Physical activity:    Days per week: Not on file    Minutes per session: Not on file  . Stress: Not on file  Relationships  . Social connections:    Talks on phone: Not on file    Gets together: Not on file    Attends religious service: Not on file    Active member of club or organization: Not on file    Attends meetings of clubs or organizations: Not on file    Relationship status: Not on file  Other Topics Concern  . Not on file  Social History Narrative   Divorced, lives w/ 1 of her children   Original from [place], living in [place] since age 12                      Vitals:   02/19/18 1358  BP: 118/60  Pulse: 71  Resp: 12  Temp: 98.4 F (36.9 C)  SpO2: 97%   Body mass index is 36.82 kg/m.  Wt Readings from Last 3 Encounters:  02/19/18 228 lb 2 oz (103.5 kg)  01/06/18 229 lb (103.9 kg)  06/12/17 223 lb 6 oz (101.3 kg)     Physical Exam  Nursing note and vitals reviewed. Constitutional: She is oriented to person, place, and time. She appears well-developed. No distress.  HENT:  Head: Normocephalic and atraumatic.  Eyes: Conjunctivae are normal.  Neck: No  tracheal deviation present. Thyromegaly present.  Cardiovascular: Normal rate and regular rhythm.  No murmur heard. Pulses:      Dorsalis pedis pulses are 2+ on the right side and 2+ on the left side.  Respiratory: Effort normal and breath sounds normal. No respiratory distress.  GI: Soft. She exhibits no mass. There is no hepatomegaly. There is no abdominal tenderness.  Musculoskeletal:        General: No edema.     Right elbow: She exhibits normal range of motion, no swelling and no deformity. No  radial head tenderness noted.     Left elbow: She exhibits normal range of motion, no swelling and no deformity.     Thoracic back: She exhibits no tenderness and no bony tenderness.     Lumbar back: She exhibits no tenderness and no bony tenderness.     Comments: Right knee: on inspection no effusion, erythema, or deformities. Valgus and varus stress normal, anterior and posterior drawer test negative. Patellar apprehension test negative. Pain is localized inferomedial right knee joint.  Knee crepitus bilateral.  No signs of synovitis. Tenderness upon palpation of some MCP and PIP joints bilateral, no deformity.   Lymphadenopathy:    She has no cervical adenopathy.  Neurological: She is alert and oriented to person, place, and time. She has normal strength. No cranial nerve deficit. Gait normal.  Skin: Skin is warm. No rash noted. No erythema.  Psychiatric: Her mood appears anxious.  Well groomed, good eye contact.      ASSESSMENT AND PLAN:   Ms. Roshawna was seen today for right leg pain.  Diagnoses and all orders for this visit:  Right knee pain, unspecified chronicity ? Pes anserine bursitis. We discussed treatment options, she is not interested in ortho referral. Local ice may help.   Vitamin D deficiency, unspecified She is not on vitamin D supplementation. Further recommendation will be given according to lab results.   Class 2 obesity with body mass index (BMI) of  36.0 to 36.9 in adult We discussed benefits of wt loss as well as adverse effects of obesity. Consistency with healthy diet and physical activity recommended.    Multiple thyroid nodules Will review recent TSH results as well as last thyroid US. No further follow-up with thyroid US is recommended. If she is concerned about thyroid nodules and/or function despite of recent lab/imaging results, recommend following with endocrinologist (as recommended last visit).  Polyarthralgia We discussed possible etiologies. Physical examination does not suggest a serious rheumatologic disorder. Most likely OA, educated about diagnosis and treatment options. Weight loss may help with knee pain. Further recommendation will be given according to lab results.   We discussed some side effects of lovastatin, which could be contributing or causing some of symptoms reported today.  Recommend holding the medication for 3 to 4 weeks and monitor for myalgias and arthralgias changes.    Return for Keep next appt.     Dayveon Halley G. Martinique, MD  [facility]. [place] office.

## 2018-02-19 NOTE — Assessment & Plan Note (Signed)
We discussed possible etiologies. Physical examination does not suggest a serious rheumatologic disorder. Most likely OA, educated about diagnosis and treatment options. Weight loss may help with knee pain. Further recommendation will be given according to lab results.

## 2018-02-19 NOTE — Assessment & Plan Note (Signed)
She is not on vitamin D supplementation. Further recommendation will be given according to lab results.

## 2018-02-21 ENCOUNTER — Ambulatory Visit: Payer: Medicare Other | Admitting: Family Medicine

## 2018-02-22 ENCOUNTER — Encounter: Payer: Self-pay | Admitting: Family Medicine

## 2018-03-06 ENCOUNTER — Ambulatory Visit
Admission: RE | Admit: 2018-03-06 | Discharge: 2018-03-06 | Disposition: A | Discharge status: Home / Self Care | Payer: Medicare Other | Source: Ambulatory Visit | Attending: Family Medicine | Admitting: Family Medicine

## 2018-03-06 DIAGNOSIS — Z780 Asymptomatic menopausal state: Secondary | ICD-10-CM

## 2018-03-07 ENCOUNTER — Other Ambulatory Visit: Payer: Medicare Other

## 2018-03-10 ENCOUNTER — Encounter: Payer: Self-pay | Admitting: Family Medicine

## 2018-03-26 ENCOUNTER — Encounter: Payer: Self-pay | Admitting: Family Medicine

## 2018-03-26 ENCOUNTER — Ambulatory Visit: Payer: Medicare Other | Admitting: Family Medicine

## 2018-03-26 VITALS — BP 130/70 | HR 70 | Temp 98.6°F | Resp 12 | Ht 66.0 in | Wt 223.2 lb

## 2018-03-26 DIAGNOSIS — R0600 Dyspnea, unspecified: Secondary | ICD-10-CM

## 2018-03-26 DIAGNOSIS — R002 Palpitations: Secondary | ICD-10-CM | POA: Diagnosis not present

## 2018-03-26 DIAGNOSIS — R0609 Other forms of dyspnea: Secondary | ICD-10-CM

## 2018-03-26 DIAGNOSIS — G5603 Carpal tunnel syndrome, bilateral upper limbs: Secondary | ICD-10-CM | POA: Diagnosis not present

## 2018-03-26 DIAGNOSIS — R519 Headache, unspecified: Secondary | ICD-10-CM

## 2018-03-26 DIAGNOSIS — R51 Headache: Secondary | ICD-10-CM | POA: Diagnosis not present

## 2018-03-26 DIAGNOSIS — I10 Essential (primary) hypertension: Secondary | ICD-10-CM

## 2018-03-26 NOTE — Progress Notes (Signed)
ACUTE VISIT   HPI:  Chief Complaint  Patient presents with  . Numbness    numbness in hands x 1 month  . Headache    x 1 day    [name] is a 68 y.o. female, who is here today complaining of above problems and well as chest discomfort. A couple of days of cough, mostly non productive, occasionally she has clear sputum. Denies hemoptysis.  Nasal congestion and rhinorrhea.  Hx of allergic rhinitis. She takes Allegra 180 mg daily.  She is concerned about chest pain related to cardiac disease.  Concerned about strong FHx of CAD. Requesting cardiology referral.  C/O exertional dyspnea for the past month, this is a new problem. Sometimes associated with palpitations,no chest pain.  Hand numbness,intermittent,for a while and getting worse for the past 3 months. Bilateral, R>L. It is worse at night,wakes her up. Alleviated by shaking hands. Difficulty with opening jars.  Negative for neck pain or UE weakness. She has not tried OTC medications.  Lab Results  Component Value Date   TSH 0.88 01/06/2018   Parietal pressure/sharp headache,intermittent that started last night. 5/10. She has had headache intermittent ,about 3 times per week. Took Aspirin x 1 and helped. She wakes up with headache, better as the day goes.  No associated visual changes,nasuea,vomiting,or focal neurologist deficit. She has not identified exacerbating or alleviating factors.  BP has been high, 155/73 today at 6 am. She is on Amlodipine 5 mg daily.  Review of Systems  Constitutional: Positive for fatigue. Negative for activity change, appetite change and fever.  HENT: Positive for congestion and rhinorrhea. Negative for mouth sores, nosebleeds and sore throat.   Eyes: Negative for redness and visual disturbance.  Respiratory: Positive for cough and shortness of breath. Negative for wheezing.   Cardiovascular: Positive for chest pain and palpitations. Negative for leg  swelling.  Gastrointestinal: Negative for abdominal pain, nausea and vomiting.       Negative for changes in bowel habits.  Genitourinary: Negative for decreased urine volume and hematuria.  Musculoskeletal: Positive for arthralgias.  Neurological: Positive for numbness and headaches. Negative for syncope and weakness.  Psychiatric/Behavioral: Negative for confusion. The patient is nervous/anxious.     Current Outpatient Medications on File Prior to Visit  Medication Sig Dispense Refill  . amLODipine (NORVASC) 5 MG tablet TAKE 1 TABLET(5 MG) BY MOUTH EVERY MORNING 90 tablet 1  . fexofenadine (ALLEGRA ALLERGY) 180 MG tablet Take 1 tablet (180 mg total) by mouth daily. 90 tablet 3  . fluticasone (FLONASE) 50 MCG/ACT nasal spray Place 1 spray into both nostrils 2 (two) times daily. (Patient taking differently: Place 1 spray into both nostrils as needed. ) 16 g 3  . gabapentin (NEURONTIN) 100 MG capsule Take 3 capsules (300 mg total) by mouth at bedtime. 90 capsule 1  . lovastatin (MEVACOR) 20 MG tablet Take 1 tablet (20 mg total) by mouth at bedtime. 90 tablet 3  . sertraline (ZOLOFT) 50 MG tablet TAKE 1 TABLET(50 MG) BY MOUTH DAILY 90 tablet 2   No current facility-administered medications on file prior to visit.      Past Medical History:  Diagnosis Date  . Allergic rhinitis   . Anxiety   . Anxiety and depression   . Constipation, chronic   . Coronary artery disease    06-2010, had CP----> Mild nonobstructive CAD on cardiac CT   . Hemorrhoids   . Hip pain, bilateral   . Hyperlipidemia   .  Hypertension   . Leukoplakia of vagina   . Low back pain    w/ left side radiculopathy, s/p injection at L4-L5 forarmen 1/06  . Uterine fibroid    No Known Allergies  Social History   Socioeconomic History  . Marital status: Divorced    Spouse name: Not on file  . Number of children: 3  . Years of education: Not on file  . Highest education level: Not on file  Occupational History  .  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 Financial resource strain: Not on file  . Food insecurity:    Worry: Not on file    Inability: Not on file  . Transportation needs:    Medical: Not on file    Non-medical: Not on file  Tobacco Use  . Smoking status: Never Smoker  . Smokeless tobacco: Never Used  Substance and Sexual Activity  . Alcohol use: Yes    Comment: rarely  . Drug use: No  . Sexual activity: Never    Birth control/protection: Surgical  Lifestyle  . Physical activity:    Days per week: Not on file    Minutes per session: Not on file  . Stress: Not on file  Relationships  . Social connections:    Talks on phone: Not on file    Gets together: Not on file    Attends religious service: Not on file    Active member of club or organization: Not on file    Attends meetings of clubs or organizations: Not on file    Relationship status: Not on file  Other Topics Concern  . Not on file  Social History Narrative   Divorced, lives w/ 1 of her children   Original from [place], living in [place] since age 32                      Vitals:   03/26/18 1451  BP: 130/70  Pulse: 70  Resp: 12  Temp: 98.6 F (37 C)  SpO2: 97%   Body mass index is 36.03 kg/m.  Physical Exam  Nursing note and vitals reviewed. Constitutional: She is oriented to person, place, and time. She appears well-developed. No distress.  HENT:  Head: Normocephalic and atraumatic.  Mouth/Throat: Oropharynx is clear and moist and mucous membranes are normal.  Eyes: Pupils are equal, round, and reactive to light. Conjunctivae are normal.  Cardiovascular: Normal rate and regular rhythm.  No murmur heard. Pulses:      Dorsalis pedis pulses are 2+ on the right side and 2+ on the left side.  Respiratory: Effort normal and breath sounds normal. No respiratory distress. She exhibits no tenderness.  GI: Soft. She exhibits no mass. There is no  hepatomegaly. There is no abdominal tenderness.  Musculoskeletal:        General: No edema.     Comments: Positive Tinel's right. Negative Phalen bilateral.  Lymphadenopathy:    She has no cervical adenopathy.  Neurological: She is alert and oriented to person, place, and time. She has normal strength. No cranial nerve deficit. Gait normal.  Skin: Skin is warm. No rash noted. No erythema.  Psychiatric: Her mood appears anxious.  Well groomed, good eye contact.    ASSESSMENT AND PLAN:  Ms. Jarielys was seen today for numbness and headache.  Diagnoses and all orders for this visit:  Exertional dyspnea ? Deconditioning,obesity,respiratory,and cardiac among some possible etiologies. Cardiology  referral placed. Instructed about warning signs.  -     EKG 12-Lead -     Ambulatory referral to Cardiology  Palpitations Possible causes discussed. ? Anxiety. EKG today SR,normal axis and intervals,no signs of ischemia. No significant changes when compared with EKG 09/2014. Instructed about warning signs.  -     EKG 12-Lead -     Ambulatory referral to Cardiology  Carpal tunnel syndrome on both sides Educated about Dx and treatment options. Recommend wrist splint at night.  Headache, unspecified headache type ? Tension headache. Instructed about warning signs. I do not think head imaging is needed at this time. Instructed about warning signs. F/U in 3 months.  Essential hypertension Today BP adequately controlled. For now no changes in current management. Continue monitoring BP. Low salt diet recommended. F/U in 3 months.     Return in about 3 months (around 06/24/2018) for HTN,anxiety.     Davette Nugent G. Martinique, MD  [facility]. [place] office.

## 2018-03-26 NOTE — Patient Instructions (Addendum)
A few things to remember from today's visit:   Palpitations - Plan: EKG 12-Lead  Exertional dyspnea - Plan: EKG 12-Lead  Carpal tunnel syndrome on both sides  Headache, unspecified headache type  Essential hypertension  Headache could be related with tension headache. Continue monitoring blood pressure.  [place] de [employer] en los adultos Wrist Splint, Adult Una frula en la mueca mantiene la [place] en una posicin fija de modo que no se [place]. Mantiene inmvil la [place], pero no es tan rgida como un yeso (es flexible). Puede quitrsela o aflojarla. Tal vez deba usar una frula si se lesiona o se le hincha la [place]. Una frula puede hacer lo siguiente:  Water quality scientist.  Proteger la mueca cuando se lastima (lesiona).  Ayudar a que no se vuelva a Scientist, research (physical sciences).  Mantener la [place] y sin movimiento.  Disminuir Conservation officer, historic buildings.  Ayudar a la curacin de la [place]. Es importante que use la frula como se lo haya indicado el mdico. Esto ayuda a asegurar que la mueca sane rpidamente. Cules son los riesgos? Si [place] la frula demasiado ajustada o la hinchazn es importante, es posible que la sangre no llegue a la mueca o la [place]. Si esto sucede, puede tener una afeccin llamada sndrome compartimental. Este puede ser peligroso y provocar daos permanentes. Los sntomas son los siguientes:  Dolor en la mueca que [place].  Hormigueo.  No sentir la mueca o la mano. Esto se llama entumecimiento.  Cambios en el color de la piel. La piel puede verse demasiado clara (plida) o azulada.  Dedos fros. El uso de una frula conlleva otros riesgos que pueden ser los siguientes:  Rigidez en la [place].  Debilidad en la mueca.  Irritacin de la piel que puede causar lo siguiente: ? Picazn. ? Erupcin cutnea. ? Llagas. ? Infeccin. Cmo usar la frula en la [employer] La frula debe estar bien ajustada como para mantener inmvil la [place]. No debe bloquear la  irrigacin de sangre a la mano o la [place]. El Viacom dir cmo usar la frula en la [place] y durante cunto [place]. Uso de la frula  Use la frula segn las indicaciones del mdico. Qutesela solamente como se lo haya indicado el mdico.  Afloje la frula si los dedos se le adormecen, siente hormigueos o se le enfran y se tornan de Optician, dispensing.  Mantenga la frula limpia.  Si la frula no es impermeable: ? No deje que se moje. ? Cbrala con un envoltorio hermtico cuando tome un bao de inmersin o [place].  No introduzca nada adentro de la frula para rascarse la piel. Esto puede aumentar el riesgo de contraer una infeccin.  Controle la piel debajo de la frula cada vez que se la saca. Verifique si tiene enrojecimiento o ampollas. Comunquele al mdico cualquier problema que observe en la piel. Control del dolor, la rigidez y la hinchazn   Si se lo indican, aplique hielo sobre la zona de la lesin. ? Si tiene una frula que puede sacarse, squesela como se lo haya indicado el mdico. ? Ponga el hielo en una bolsa plstica. ? Coloque una [employer] piel y la bolsa de hielo. ? Coloque el hielo durante 78minutos, de 2 a 3veces por da.  Mueva los dedos con frecuencia para evitar la rigidez y reducir la hinchazn.  Cuando est sentado o acostado, levante (eleve) la zona lesionada por encima del nivel del corazn. Actividad  Retome sus actividades habituales como  se lo haya indicado el mdico. Pregntele al mdico qu actividades son seguras para usted.  Haga los ejercicios como se lo haya indicado el mdico.  Consulte al mdico cundo es seguro conducir si tiene una frula en la [place]. Instrucciones generales  No apoye el peso de su cuerpo NIKE extremidad [employer] que el mdico lo autorice.  No ejerza presin en ninguna parte de la frula hasta que se haya endurecido por completo. Esto puede tardar muchas horas.  No consuma ningn producto que contenga  nicotina o tabaco, como cigarrillos y Psychologist, sport and exercise. Si necesita ayuda para dejar de fumar, consulte al [employer].  [employer]phi de venta libre y los recetados solamente como se lo haya indicado el mdico.  Consulting civil engineer a todas las visitas de control como se lo haya indicado el mdico. Esto es importante. Solicite ayuda si:  Tiene dolor o hinchazn de mueca que no desaparecen.  La piel alrededor o debajo de la frula se enrojece, le pica o est hmeda.  Tiene escalofros o fiebre.  La frula est muy ajustada o muy floja.  La frula se rompe. Solicite ayuda de inmediato si:  Siente un dolor que [place].  Tiene hormigueo o entumecimiento.  Tiene cambios en el color de la piel, tal como un color plido o azulado.  Tiene fros los dedos. Resumen  Una frula de [place] es un dispositivo flexible que brinda soporte a la [place] e impide que mueva la [place].  Es importante que use la frula como se lo haya indicado el mdico. Esto ayuda a asegurar que la mueca sane correctamente.  Aplicar hielo, mover los dedos y Lexicographer la mueca por arriba del nivel del corazn lo ayudarn a Financial controller, el entumecimiento y la hinchazn.  La frula debe estar bien ajustada como para mantener inmvil la [place]. No debe bloquear la irrigacin de [place].  Solicite ayuda de inmediato si siente hormigueo o entumecimiento en los dedos, o si se ponen fros y azules. Afloje inmediatamente la frula. Esta informacin no tiene Marine scientist el consejo del mdico. Asegrese de hacerle al mdico cualquier pregunta que tenga. Document Released: 04/13/2008 Document Revised: 08/17/2016 Document Reviewed: 08/17/2016 Elsevier Interactive Patient Education  2019 Elsevier Inc.  Wrist the splint to wear at night for a few weeks, if numbness does not get better we can either refer you to orthopedist or order a nerve test.   Sndrome del tnel carpiano Carpal Tunnel Syndrome  El  sndrome del tnel carpiano es una afeccin que causa dolor en la mano y en el brazo. El tnel carpiano es un rea estrecha que se encuentra en el lado palmar de la [place]. Los movimientos repetidos de la mueca o determinadas enfermedades pueden causar hinchazn en el tnel. Esta hinchazn puede comprimir el nervio principal de la mueca (nervio mediano). Cules son las causas? Esta afeccin puede ser causada por lo siguiente:  Movimientos repetidos de [employer].  Lesiones en la [place].  Artritis.  Un saco lleno de lquido (quiste) o un crecimiento anormal (tumor) en el tnel carpiano.  Acumulacin de lquido Solicitor. Algunas veces, la causa no se conoce. Qu incrementa el riesgo? Los siguientes factores pueden hacer que sea ms propenso a Emergency planning/management officer afeccin:  Tener un trabajo que exige mover la [place] del mismo modo muchas veces. Esto incluye trabajos como el de carnicero o el de cajero.  Ser mujer.  Tener otras afecciones, por ejemplo: ? Diabetes. ? Obesidad. ? Una glndula tiroidea  que no es lo suficientemente activa (hipotiroidismo). ? Insuficiencia renal. Cules son los signos o sntomas? Los sntomas de esta afeccin incluyen:  Sensacin de hormigueo en los dedos.  Hormigueo o prdida de la sensibilidad (adormecimiento) en la mano.  Dolor en todo el brazo. Este dolor puede empeorar al flexionar la [place] y el codo durante [place].  Dolor en la mueca que sube por el brazo hasta el hombro.  Dolor que baja hasta la palma de la mano o los dedos.  Sensacin de [employer]. Puede resultarle difcil tomar y Licensed conveyancer. Es posible que se sienta peor por la noche. Cmo se diagnostica? Esta afeccin se diagnostica mediante los antecedentes mdicos y un examen fsico. Tambin pueden hacerle estudios, por ejemplo:  Una electromiograma (EMG). Este estudio comprueba las seales que los nervios envan a los msculos.  Estudio de  [employer]. Este estudio comprueba si las seales pasan correctamente por los nervios.  Estudios de diagnstico por imgenes, como radiografas, una ecografa y Public house manager (RM). Estos estudios Chiropodist cul podra ser la causa de su afeccin. Cmo se trata? El tratamiento de esta afeccin puede incluir:  Cambios en el estilo de vida. Se le pedir que deje o cambie la actividad que caus el problema.  Hacer ejercicio y actividades para que los msculos y los huesos se vuelvan ms fuertes (fisioterapia).  Aprender a Control and instrumentation engineer nuevamente (terapia ocupacional).  Medicamentos para tratar Conservation officer, historic buildings y la hinchazn (inflamacin). Es posible que le apliquen inyecciones en la [place].  Una frula para la [place].  Clementeen Hoof. Siga estas indicaciones en su casa: Si tiene una frula:  Use la frula como se lo haya indicado el mdico. Quteselo solamente como se lo haya indicado el mdico.  Afloje la frula si los dedos: ? Hormiguean. ? Pierden la sensibilidad (se adormecen). ? Se tornan fros y de [employer].  Mantenga la frula limpia.  Si la frula no es impermeable: ? No deje que se moje. ? Cbralo con un envoltorio hermtico cuando tome un bao de inmersin o [place]. Control del dolor, la rigidez y la hinchazn   Si se lo indican, aplique hielo sobre la zona dolorida: ? Si tiene una frula desmontable, qutesela como se lo haya indicado el mdico. ? Ponga el hielo en una bolsa plstica. ? Coloque una [employer] piel y Therapist, nutritional. ? Coloque el hielo durante 18minutos, 2a3veces al da. Indicaciones generales  Delphi de venta libre y los recetados solamente como se lo haya indicado el mdico.  Descanse la [place] de cualquier actividad que le cause dolor. Si es necesario, hable con su jefe en el [employer] cambios que pueden ayudar a la curacin de la [place].  Haga los ejercicios como se lo hayan indicado el mdico,  el fisioterapeuta o el terapeuta ocupacional.  Concurra a todas las visitas de seguimiento como se lo haya indicado el mdico. Esto es importante. Comunquese con un mdico si:  Aparecen nuevos sntomas.  Los medicamentos no Forensic psychologist.  Sus sntomas empeoran. Solicite ayuda de inmediato si:  Tiene adormecimiento u hormigueo muy intensos en la mueca o la [place]. Resumen  El sndrome del tnel carpiano es una afeccin que causa dolor en la mano y en el brazo.  Suele deberse a movimientos repetidos de Advertising copywriter.  Este problema se trata mediante cambios en el estilo de vida y medicamentos. La ciruga puede ser necesaria en casos muy  graves.  Siga las indicaciones del mdico sobre el uso de una frula, el reposo de la [place], la asistencia a las consultas de control y Solicitor para pedir ayuda. Esta informacin no tiene Marine scientist el consejo del mdico. Asegrese de hacerle al mdico cualquier pregunta que tenga. Document Released: 01/04/2011 Document Revised: 06/20/2017 Document Reviewed: 06/20/2017 Elsevier Interactive Patient Education  2019 [employer].  Please be sure medication list is accurate. If a new problem present, please set up appointment sooner than planned today.

## 2018-03-29 ENCOUNTER — Encounter: Payer: Self-pay | Admitting: Family Medicine

## 2018-04-01 ENCOUNTER — Ambulatory Visit: Payer: Self-pay

## 2018-04-01 ENCOUNTER — Other Ambulatory Visit: Payer: Self-pay

## 2018-04-01 ENCOUNTER — Encounter (HOSPITAL_COMMUNITY): Payer: Self-pay

## 2018-04-01 ENCOUNTER — Emergency Department (HOSPITAL_COMMUNITY)
Admission: EM | Admit: 2018-04-01 | Discharge: 2018-04-01 | Disposition: A | Discharge status: Home / Self Care | Payer: Medicare Other | Attending: Emergency Medicine | Admitting: Emergency Medicine

## 2018-04-01 ENCOUNTER — Emergency Department (HOSPITAL_COMMUNITY): Payer: Medicare Other

## 2018-04-01 DIAGNOSIS — I10 Essential (primary) hypertension: Secondary | ICD-10-CM | POA: Diagnosis not present

## 2018-04-01 DIAGNOSIS — R079 Chest pain, unspecified: Secondary | ICD-10-CM | POA: Diagnosis present

## 2018-04-01 DIAGNOSIS — R0789 Other chest pain: Secondary | ICD-10-CM

## 2018-04-01 DIAGNOSIS — I2510 Atherosclerotic heart disease of native coronary artery without angina pectoris: Secondary | ICD-10-CM | POA: Insufficient documentation

## 2018-04-01 DIAGNOSIS — Z79899 Other long term (current) drug therapy: Secondary | ICD-10-CM | POA: Diagnosis not present

## 2018-04-01 DIAGNOSIS — T466X5A Adverse effect of antihyperlipidemic and antiarteriosclerotic drugs, initial encounter: Secondary | ICD-10-CM | POA: Insufficient documentation

## 2018-04-01 DIAGNOSIS — F4321 Adjustment disorder with depressed mood: Secondary | ICD-10-CM

## 2018-04-01 DIAGNOSIS — M791 Myalgia, unspecified site: Secondary | ICD-10-CM | POA: Diagnosis not present

## 2018-04-01 LAB — CBC
HCT: 38.1 % (ref 36.0–46.0)
Hemoglobin: 12.3 g/dL (ref 12.0–15.0)
MCH: 28.6 pg (ref 26.0–34.0)
MCHC: 32.3 g/dL (ref 30.0–36.0)
MCV: 88.6 fL (ref 80.0–100.0)
NRBC: 0 % (ref 0.0–0.2)
Platelets: 247 10*3/uL (ref 150–400)
RBC: 4.3 MIL/uL (ref 3.87–5.11)
RDW: 13.2 % (ref 11.5–15.5)
WBC: 6.7 10*3/uL (ref 4.0–10.5)

## 2018-04-01 LAB — BASIC METABOLIC PANEL
ANION GAP: 8 (ref 5–15)
BUN: 8 mg/dL (ref 8–23)
CO2: 24 mmol/L (ref 22–32)
Calcium: 9.1 mg/dL (ref 8.9–10.3)
Chloride: 106 mmol/L (ref 98–111)
Creatinine, Ser: 0.62 mg/dL (ref 0.44–1.00)
GFR calc non Af Amer: >60 mL/min (ref >60)
Glucose, Bld: 107 mg/dL — ABNORMAL HIGH (ref 70–99)
Potassium: 3.7 mmol/L (ref 3.5–5.1)
Sodium: 138 mmol/L (ref 135–145)

## 2018-04-01 LAB — I-STAT TROPONIN, ED
Troponin i, poc: 0 ng/mL (ref 0.00–0.08)
Troponin i, poc: 0 ng/mL (ref 0.00–0.08)

## 2018-04-01 MED ORDER — SODIUM CHLORIDE 0.9% FLUSH: 3.0000 mL | Freq: Once | INTRAVENOUS | Status: DC

## 2018-04-01 MED ORDER — ALPRAZOLAM 0.5 MG PO TABS: 0.5000 mg | ORAL_TABLET | Freq: Two times a day (BID) | ORAL | 0 refills | Status: DC | PRN

## 2018-04-01 NOTE — Telephone Encounter (Signed)
Will send to Dr Martinique to make aware. Will also hold for follow up of ED visit

## 2018-04-01 NOTE — ED Notes (Signed)
ED Provider at bedside. 

## 2018-04-01 NOTE — ED Triage Notes (Signed)
Pt reports chest pain that started 3 days ago. Also reports hypertension. Strong family hx with cardiac problems. Skin warm and dry, no distress noted.

## 2018-04-01 NOTE — Telephone Encounter (Signed)
Appointment with cardiologist is pending. If chest pain is persistent, radiated to left upper extremity/neck/jaw, diaphoresis, associated palpitations and/or dyspnea, she needs to seek immediate medical attention. Thanks, BJ

## 2018-04-01 NOTE — ED Provider Notes (Signed)
[facility] EMERGENCY DEPARTMENT Provider Note   CSN: [id number] Arrival date & time: 04/01/18  1406    History   Chief Complaint Chief Complaint  Patient presents with  . Chest Pain    HPI Veronica [name] is a 68 y.o. female.     Pt presents to the ED today with chest pain.  The pt said it has been intermittent for 3 days.  One of her close friends from work passed away suddenly a few days ago and she feels like she's been under a lot of stress over that.  Walking or exerting herself does not cause CP.  She has no CP now.  She denies sob.  No n/v.  She does take a statin and c/o leg pain.  Her pcp has referred her to cards, but she has not had an appt yet.     Past Medical History:  Diagnosis Date  . Allergic rhinitis   . Anxiety   . Anxiety and depression   . Constipation, chronic   . Coronary artery disease    06-2010, had CP----> Mild nonobstructive CAD on cardiac CT   . Hemorrhoids   . Hip pain, bilateral   . Hyperlipidemia   . Hypertension   . Leukoplakia of vagina   . Low back pain    w/ left side radiculopathy, s/p injection at L4-L5 forarmen 1/06  . Uterine fibroid     Patient Active Problem List   Diagnosis Date Noted  . Vitamin D deficiency, unspecified 02/19/2018  . Polyarthralgia 02/19/2018  . Multiple thyroid nodules 01/06/2018  . Class 2 obesity with body mass index (BMI) of 36.0 to 36.9 in adult 10/10/2016  . Insomnia secondary to anxiety 08/26/2015  . PCP NOTES >>>>>>>>>>>> 04/29/2015  . Encounter for routine gynecological examination 05/10/2014  . GERD (gastroesophageal reflux disease) 10/25/2010  . HTN (hypertension) 09/15/2010  . Lumbar back pain with radiculopathy affecting left lower extremity 10/07/2009  . HIP PAIN, BILATERAL 10/08/2008  . Dyslipidemia 05/19/2008  . LEUKOPLAKIA OF VAGINA 05/18/2008  . Anxiety disorder 01/24/2006  . Allergic rhinitis 01/24/2006  . CONSTIPATION, CHRONIC 01/24/2006    Past Surgical  History:  Procedure Laterality Date  . ABDOMINAL HYSTERECTOMY  11-2000   no oophorectomy per pt  . CHOLECYSTECTOMY  12/08/2010   Procedure: LAPAROSCOPIC CHOLECYSTECTOMY WITH INTRAOPERATIVE CHOLANGIOGRAM;  Surgeon: Pedro Earls, MD;  Location: [facility] ORS;  Service: General;  Laterality: N/A;  c-arm   . COLONOSCOPY WITH PROPOFOL N/A 02/02/2014   Procedure: COLONOSCOPY WITH PROPOFOL;  Surgeon: Garlan Fair, MD;  Location: [facility] ENDOSCOPY;  Service: Endoscopy;  Laterality: N/A;  . LEEP     unknown date   . TUBAL LIGATION     bilateral 1989     OB History   No obstetric history on file.      Home Medications    Prior to Admission medications   Medication Sig Start Date End Date Taking? Authorizing Provider  ALPRAZolam Duanne Moron) 0.5 MG tablet Take 1 tablet (0.5 mg total) by mouth 2 (two) times daily as needed for anxiety. 04/01/18   Isla Pence, MD  amLODipine (NORVASC) 5 MG tablet TAKE 1 TABLET(5 MG) BY MOUTH EVERY MORNING 07/23/17   Martinique, Betty G, MD  fexofenadine [facility] ALLERGY) 180 MG tablet Take 1 tablet (180 mg total) by mouth daily. 05/14/16   Martinique, Betty G, MD  fluticasone (FLONASE) 50 MCG/ACT nasal spray Place 1 spray into both nostrils 2 (two) times daily. Patient taking differently:  Place 1 spray into both nostrils as needed.  05/14/16   Martinique, Betty G, MD  gabapentin (NEURONTIN) 100 MG capsule Take 3 capsules (300 mg total) by mouth at bedtime. 06/12/17   Martinique, Betty G, MD  lovastatin (MEVACOR) 20 MG tablet Take 1 tablet (20 mg total) by mouth at bedtime. 01/14/18   Martinique, Betty G, MD  sertraline (ZOLOFT) 50 MG tablet TAKE 1 TABLET(50 MG) BY MOUTH DAILY 06/12/17   Martinique, Betty G, MD    Family History Family History  Problem Relation Age of Onset  . Schizophrenia Son   . Schizophrenia Brother   . Schizophrenia Other        2 nieces  . Diabetes Neg Hx   . Heart attack Neg Hx   . Colon cancer Neg Hx   . Breast cancer Neg Hx     Social History Social History    Tobacco Use  . Smoking status: Never Smoker  . Smokeless tobacco: Never Used  Substance Use Topics  . Alcohol use: Yes    Comment: rarely  . Drug use: No     Allergies   Patient has no known allergies.   Review of Systems Review of Systems  Cardiovascular: Positive for chest pain.  All other systems reviewed and are negative.    Physical Exam Updated Vital Signs BP 138/85 (BP Location: Left Arm)   Pulse 64   Temp 98.3 F (36.8 C) (Oral)   Resp 11   SpO2 99%   Physical Exam Vitals signs and nursing note reviewed.  Constitutional:      Appearance: She is well-developed.  HENT:     Head: Normocephalic and atraumatic.  Eyes:     Extraocular Movements: Extraocular movements intact.     Pupils: Pupils are equal, round, and reactive to light.  Neck:     Musculoskeletal: Normal range of motion and neck supple.  Cardiovascular:     Rate and Rhythm: Normal rate and regular rhythm.     Heart sounds: Normal heart sounds.  Pulmonary:     Effort: Pulmonary effort is normal.     Breath sounds: Normal breath sounds.  Abdominal:     General: Bowel sounds are normal.     Palpations: Abdomen is soft.  Musculoskeletal: Normal range of motion.  Skin:    General: Skin is warm and dry.     Capillary Refill: Capillary refill takes less than 2 seconds.  Neurological:     General: No focal deficit present.     Mental Status: She is alert and oriented to person, place, and time.  Psychiatric:        Mood and Affect: Mood is anxious.        Behavior: Behavior normal.      ED Treatments / Results  Labs (all labs ordered are listed, but only abnormal results are displayed) Labs Reviewed  BASIC METABOLIC PANEL - Abnormal; Notable for the following components:      Result Value   Glucose, Bld 107 (*)    All other components within normal limits  CBC  I-STAT TROPONIN, ED  I-STAT TROPONIN, ED    EKG EKG Interpretation  Date/Time:  Tuesday April 01 2018 14:13:25  EST Ventricular Rate:  70 PR Interval:  168 QRS Duration: 74 QT Interval:  402 QTC Calculation: 434 R Axis:   41 Text Interpretation:  Normal sinus rhythm Low voltage QRS Borderline ECG No significant change since last tracing Confirmed by Isla Pence [phone]) on 04/01/2018 6:28:12  PM   Radiology Dg Chest 2 View  Result Date: 04/01/2018 CLINICAL DATA:  Chest tightness for the past 3 days. EXAM: CHEST - 2 VIEW COMPARISON:  03/05/2016. FINDINGS: Normal sized heart. Clear lungs. The lungs remain mildly hyperexpanded with stable mild diffuse peribronchial thickening and accentuation of the interstitial markings. Stable small amount of linear scarring in the lingula. Mild thoracic spine degenerative changes. IMPRESSION: No acute disease. Stable mild changes of COPD and chronic bronchitis. Electronically Signed   By: Claudie Revering M.D.   On: 04/01/2018 15:22    Procedures Procedures (including critical care time)  Medications Ordered in ED Medications  sodium chloride flush (NS) 0.9 % injection 3 mL (has no administration in time range)     Initial Impression / Assessment and Plan / ED Course  I have reviewed the triage vital signs and the nursing notes.  Pertinent labs & imaging results that were available during my care of the patient were reviewed by me and considered in my medical decision making (see chart for details).       Pt encouraged to tell her doctor that the statin causes leg pain.  CP is atypical in nature and 2 troponins several hours apart are nl.  EKG nl.  Pt may be having an acute grief reaction, so I gave her 10 0.5 mg Xanax pills.  She knows to take them only if needed and they can cause addiction, so they are not good in the long term.  She is already on a SSRI.    She knows to return if worse and to f/u with cards/pcp.  Final Clinical Impressions(s) / ED Diagnoses   Final diagnoses:  Atypical chest pain  Myalgia due to statin  Grief reaction    ED  Discharge Orders         Ordered    ALPRAZolam (XANAX) 0.5 MG tablet  2 times daily PRN     04/01/18 1929           Isla Pence, MD 04/01/18 1931

## 2018-04-01 NOTE — Telephone Encounter (Signed)
Pt c/o chest pressure to left side of chest for 6 days. Pt stated pain was mild, but has worsened in intensity for the past 2 days. No radiation,. Pt feels fatigued . Pt is experience anxiety and grief because a close friend died.  Pt denies SOB or other symptoms.  Care advice giver, pt advised to go to the ED at [facility].    Reason for Disposition . Chest pain lasts > 5 minutes (Exceptions: chest pain occurring > 3 days ago and now asymptomatic; same as previously diagnosed heartburn and has accompanying sour taste in mouth)  Answer Assessment - Initial Assessment Questions 1. LOCATION: "Where does it hurt?"       Chest pressure left breast 2. RADIATION: "Does the pain go anywhere else?" (e.g., into neck, jaw, arms, back)     no 3. ONSET: "When did the chest pain begin?" (Minutes, hours or days)      6 days ago- worse today 4. PATTERN "Does the pain come and go, or has it been constant since it started?"  "Does it get worse with exertion?"      constant 5. DURATION: "How long does it last" (e.g., seconds, minutes, hours)     constnant 6. SEVERITY: "How bad is the pain?"  (e.g., Scale 1-10; mild, moderate, or severe)    - MILD (1-3): doesn't interfere with normal activities     - MODERATE (4-7): interferes with normal activities or awakens from sleep    - SEVERE (8-10): excruciating pain, unable to do any normal activities       mild 7. CARDIAC RISK FACTORS: "Do you have any history of heart problems or risk factors for heart disease?" (e.g., prior heart attack, angina; high blood pressure, diabetes, being overweight, high cholesterol, smoking, or strong family history of heart disease)     Strong family family history of heartdisease  142/81 146/77 , HTN, overweight, high cholesterol, 8. PULMONARY RISK FACTORS: "Do you have any history of lung disease?"  (e.g., blood clots in lung, asthma, emphysema, birth control pills)     no 9. CAUSE: "What do you think is causing the chest pain?"     Pt doesn't know- friend died last week 69. OTHER SYMPTOMS: "Do you have any other symptoms?" (e.g., dizziness, nausea, vomiting, sweating, fever, difficulty breathing, cough)       no 11. PREGNANCY: "Is there any chance you are pregnant?" "When was your last menstrual period?"       n/a  Protocols used: CHEST PAIN-A-AH

## 2018-04-01 NOTE — ED Notes (Signed)
Patient verbalizes understanding of discharge instructions. Opportunity for questioning and answers were provided. Armband removed by staff, pt discharged from ED. Pt ambulatory to lobby.  

## 2018-04-01 NOTE — Telephone Encounter (Signed)
Pt is being seen at ED currently.  Will let Dr Martinique know

## 2018-04-16 ENCOUNTER — Ambulatory Visit: Payer: Medicare Other | Admitting: Cardiology

## 2018-06-27 ENCOUNTER — Other Ambulatory Visit: Payer: Self-pay

## 2018-06-27 ENCOUNTER — Ambulatory Visit (INDEPENDENT_AMBULATORY_CARE_PROVIDER_SITE_OTHER): Payer: Medicare Other | Admitting: Family Medicine

## 2018-06-27 ENCOUNTER — Encounter: Payer: Self-pay | Admitting: Family Medicine

## 2018-06-27 VITALS — BP 140/90 | HR 84 | Temp 98.1°F | Resp 12 | Ht 66.0 in | Wt 216.2 lb

## 2018-06-27 DIAGNOSIS — E785 Hyperlipidemia, unspecified: Secondary | ICD-10-CM

## 2018-06-27 DIAGNOSIS — N949 Unspecified condition associated with female genital organs and menstrual cycle: Secondary | ICD-10-CM | POA: Diagnosis not present

## 2018-06-27 DIAGNOSIS — M545 Low back pain, unspecified: Secondary | ICD-10-CM

## 2018-06-27 DIAGNOSIS — R102 Pelvic and perineal pain: Secondary | ICD-10-CM

## 2018-06-27 LAB — CBC
HCT: 38.5 % (ref 36.0–46.0)
Hemoglobin: 13 g/dL (ref 12.0–15.0)
MCHC: 33.7 g/dL (ref 30.0–36.0)
MCV: 88.5 fl (ref 78.0–100.0)
Platelets: 228 10*3/uL (ref 150.0–400.0)
RBC: 4.35 Mil/uL (ref 3.87–5.11)
RDW: 14.2 % (ref 11.5–15.5)
WBC: 5.5 10*3/uL (ref 4.0–10.5)

## 2018-06-27 LAB — POCT URINALYSIS DIPSTICK
Bilirubin, UA: NEGATIVE
Blood, UA: NEGATIVE
Glucose, UA: NEGATIVE
Ketones, UA: NEGATIVE
Leukocytes, UA: NEGATIVE
Nitrite, UA: NEGATIVE
Protein, UA: NEGATIVE
Spec Grav, UA: 1.025 (ref 1.010–1.025)
Urobilinogen, UA: 0.2 E.U./dL
pH, UA: 5.5 (ref 5.0–8.0)

## 2018-06-27 LAB — LIPID PANEL
Cholesterol: 211 mg/dL — ABNORMAL HIGH (ref 0–200)
HDL: 43.5 mg/dL (ref >39.00)
LDL Cholesterol: 136 mg/dL — ABNORMAL HIGH (ref 0–99)
NonHDL: 167.82
Total CHOL/HDL Ratio: 5
Triglycerides: 161 mg/dL — ABNORMAL HIGH (ref 0.0–149.0)
VLDL: 32.2 mg/dL (ref 0.0–40.0)

## 2018-06-27 MED ORDER — PREDNISONE 20 MG PO TABS: ORAL_TABLET | ORAL | 0 refills | Status: AC

## 2018-06-27 NOTE — Patient Instructions (Addendum)
A few things to remember from today's visit:   Low back pain without sciatica, unspecified back pain laterality, unspecified chronicity - Plan: POC Urinalysis Dipstick  Perineal pain in female - Plan: predniSONE (DELTASONE) 20 MG tablet, CBC  Hyperlipidemia, unspecified hyperlipidemia type - Plan: Lipid panel   Dolor radicular Radicular Pain El dolor radicular es un tipo de dolor que se extiende desde la espalda o el cuello a lo largo del nervio raqudeo. Los nervios raqudeos se originan en la mdula espinal y llegan a los msculos. El dolor radicular a veces se conoce como radiculopata, radiculitis o pinzamiento de un nervio. Si tiene este tipo de Social research officer, government, tambin puede sentir debilidad, adormecimiento u hormigueo en la zona del cuerpo a la que llega ese nervio. El dolor puede ser agudo y sentirse como un ardor. Segn el nervio raqudeo que est afectado, el dolor puede aparecer en los siguientes lugares:  Zona del cuello (dolor radicular cervical). Tambin puede Education officer, environmental, adormecimiento, debilidad u hormigueo [employer].  Zona de la mitad de la columna (dolor radicular torcico). Se siente en la espalda y en el pecho. Este tipo es poco frecuente.  Zona inferior de la espalda (dolor radicular lumbar). Se siente un dolor lumbar. Tambin puede Education officer, environmental, adormecimiento, debilidad u hormigueo en las nalgas o en las piernas. La citica es un tipo de dolor radicular lumbar que se extiende por la parte de atrs de la pierna. El dolor radicular se produce cuando uno de los nervios espinales se irrita o se aprieta (comprime). A menudo se debe a algo que ejerce presin sobre un nervio espinal, como uno de los huesos de la columna vertebral (vrtebras) o una de las almohadillas redondas que estn entre las vrtebras (discos intervertebrales). Esto puede deberse a lo siguiente:  Una lesin.  El desgaste o el envejecimiento de un disco.  El crecimiento de un espoln seo que aprieta el  [place]. El dolor radicular suele desaparecer cuando se siguen las indicaciones del mdico sobre cmo aliviarlo en la casa. Siga estas indicaciones en su casa: Control del dolor      Si se lo indican, aplique hielo sobre la zona afectada: ? Ponga el hielo en una bolsa plstica. ? Coloque una [employer] piel y Therapist, nutritional. ? Coloque el hielo durante 60minutos, 2 a 3veces por da.  Si se lo indican, aplique calor en la zona afectada con la frecuencia que le haya indicado el mdico. Use la fuente de calor que el mdico le recomiende, como una compresa de calor hmedo o una almohadilla trmica. ? Coloque una [employer] piel y la fuente de Freight forwarder. ? Aplique calor durante 20 a 34minutos. ? Retire la fuente de calor si la piel se pone de color rojo brillante. Esto es especialmente importante si no puede sentir dolor, calor o fro. Puede correr un riesgo mayor de sufrir quemaduras. Actividad   No permanezca sentado o acostado durante largos perodos.  Intente mantenerse lo ms activo posible. Pregntele al mdico qu tipo de ejercicio o actividad es mejor para usted.  Evite las actividades que empeoren el dolor, como doblarse y Lexicographer objetos.  No levante ningn objeto que pese ms de 10libras (4,5kg) o el lmite de peso que le hayan indicado, hasta que el mdico le diga que puede [place].  Use una tcnica adecuada al levantar objetos. La tcnica adecuada para levantar objetos consiste en doblar las rodillas y levantarse.  Haga ejercicios de fuerza y flexibilidad solamente como se lo  haya indicado el mdico o el fisioterapeuta. Indicaciones generales  [employer] de venta libre y los recetados solamente como se lo haya indicado el mdico.  Est atento a cualquier [employer] sntomas.  Concurra a todas las visitas de seguimiento como se lo haya indicado el mdico. Esto es importante. ? El mdico lo puede enviar a un fisioterapeuta para Advertising copywriter. Comunquese con un mdico si:  El dolor y otros sntomas empeoran.  El medicamento no Production designer, theatre/television/film.  El dolor no mejora despus de unas semanas de cuidado Financial planner.  Tiene fiebre. Solicite ayuda inmediatamente si:  Scientist, clinical (histocompatibility and immunogenetics), adormecimiento o debilidad intensos.  Tiene dificultad con el control de la vejiga o los intestinos. Resumen  El dolor radicular es un tipo de dolor que se extiende desde la espalda o el cuello a lo largo del nervio raqudeo.  Si tiene dolor radicular, tambin puede sentir debilidad, adormecimiento u hormigueo en la zona del cuerpo a la que llega ese nervio.  El dolor puede ser agudo o sentirse como ardor.  El dolor radicular puede tratarse con hielo, calor, medicamentos o fisioterapia. Esta informacin no tiene Marine scientist el consejo del mdico. Asegrese de hacerle al mdico cualquier pregunta que tenga. Document Released: 01/15/2005 Document Revised: 09/18/2017 Document Reviewed: 09/18/2017 Elsevier Interactive Patient Education  2019 [employer].  Please be sure medication list is accurate. If a new problem present, please set up appointment sooner than planned today.

## 2018-06-27 NOTE — Progress Notes (Signed)
ACUTE VISIT   HPI:  Chief Complaint  Patient presents with  . Back Pain    sx started 1 week ago, getting worse over last two days  . Bladder pain    [name] is a 68 y.o. female, who is here today complaining of a weeks of sharp lower back  pain. 7/10. She has Hx of lower back pain with radiation to LLE,occasional tingling.But this back pain is different.  Bilateral back pain is not radiated but she also mentions sharp perineal and suprapubic pain;she is not sure if these symptoms are related. Perianal "pinching" sensation. Gradual onset and getting worse.  Pain is exacerbated by walking and alleviated by rest. Denies saddle anesthesia or urine/bowel incontinence.  She thinks symptoms are caused by UTI. Denies dysuria,increased urinary frequency, gross hematuria,or decreased urine output.  She denies fever, chills, change in appetite, unusual fatigue, changes in bowel habits, nausea, vomiting, vaginal bleeding, or vaginal discharge.  She has taking cranberry pills.   She is also requesting to have FLP done today. HLD,she is on Lovastatin 20 mg daily. Tolerating medication well.  She is trying to be consistent with following low fat diet.  Component     Latest Ref Rng & Units 01/06/2018  Cholesterol     0 - 200 mg/dL 213 (H)  Triglycerides     0.0 - 149.0 mg/dL 111.0  HDL Cholesterol     >39.00 mg/dL 45.50  VLDL     0.0 - 40.0 mg/dL 22.2  LDL (calc)     0 - 99 mg/dL 145 (H)  Total CHOL/HDL Ratio      5  NonHDL      167.49   Review of Systems  Constitutional: Negative for appetite change, fatigue and fever.  Gastrointestinal: Negative for blood in stool, nausea and vomiting.       No changes in bowel habits.  Genitourinary: Negative for vaginal bleeding and vaginal discharge.  Musculoskeletal: Positive for back pain. Negative for gait problem and neck pain.  Skin: Negative for rash.  Neurological: Negative for weakness.   Psychiatric/Behavioral: The patient is nervous/anxious.    Rest ROS pertinent positives and negatives in HPI.   Current Outpatient Medications on File Prior to Visit  Medication Sig Dispense Refill  . ALPRAZolam (XANAX) 0.5 MG tablet Take 1 tablet (0.5 mg total) by mouth 2 (two) times daily as needed for anxiety. 10 tablet 0  . amLODipine (NORVASC) 5 MG tablet TAKE 1 TABLET(5 MG) BY MOUTH EVERY MORNING 90 tablet 1  . fexofenadine (ALLEGRA ALLERGY) 180 MG tablet Take 1 tablet (180 mg total) by mouth daily. 90 tablet 3  . fluticasone (FLONASE) 50 MCG/ACT nasal spray Place 1 spray into both nostrils 2 (two) times daily. (Patient taking differently: Place 1 spray into both nostrils as needed. ) 16 g 3  . gabapentin (NEURONTIN) 100 MG capsule Take 3 capsules (300 mg total) by mouth at bedtime. 90 capsule 1  . lovastatin (MEVACOR) 20 MG tablet Take 1 tablet (20 mg total) by mouth at bedtime. 90 tablet 3  . sertraline (ZOLOFT) 50 MG tablet TAKE 1 TABLET(50 MG) BY MOUTH DAILY 90 tablet 2   No current facility-administered medications on file prior to visit.      Past Medical History:  Diagnosis Date  . Allergic rhinitis   . Anxiety   . Anxiety and depression   . Constipation, chronic   . Coronary artery disease    06-2010, had CP---->  Mild nonobstructive CAD on cardiac CT   . Hemorrhoids   . Hip pain, bilateral   . Hyperlipidemia   . Hypertension   . Leukoplakia of vagina   . Low back pain    w/ left side radiculopathy, s/p injection at L4-L5 forarmen 1/06  . Uterine fibroid    No Known Allergies  Social History   Socioeconomic History  . Marital status: Divorced    Spouse name: Not on file  . Number of children: 3  . Years of education: Not on file  . Highest education level: Not on file  Occupational History  . 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 Financial resource strain: Not on file  . Food insecurity:     Worry: Not on file    Inability: Not on file  . Transportation needs:    Medical: Not on file    Non-medical: Not on file  Tobacco Use  . Smoking status: Never Smoker  . Smokeless tobacco: Never Used  Substance and Sexual Activity  . Alcohol use: Yes    Comment: rarely  . Drug use: No  . Sexual activity: Never    Birth control/protection: Surgical  Lifestyle  . Physical activity:    Days per week: Not on file    Minutes per session: Not on file  . Stress: Not on file  Relationships  . Social connections:    Talks on phone: Not on file    Gets together: Not on file    Attends religious service: Not on file    Active member of club or organization: Not on file    Attends meetings of clubs or organizations: Not on file    Relationship status: Not on file  Other Topics Concern  . Not on file  Social History Narrative   Divorced, lives w/ 1 of her children   Original from [place], living in [place] since age 29                      Vitals:   06/27/18 0913  BP: 140/90  Pulse: 84  Resp: 12  Temp: 98.1 F (36.7 C)  SpO2: 95%   Body mass index is 34.9 kg/m.   Physical Exam  Nursing note and vitals reviewed. Constitutional: She is oriented to person, place, and time. She appears well-developed. No distress.  HENT:  Head: Normocephalic and atraumatic.  Eyes: Conjunctivae are normal.  Cardiovascular: Normal rate and regular rhythm.  Respiratory: Effort normal and breath sounds normal. No respiratory distress.  GI: Soft. She exhibits no mass. There is abdominal tenderness. There is no rigidity, no rebound, no guarding and no CVA tenderness.    Tenderness upon deep palpation of suprapubic area.  Genitourinary: There is no rash or tenderness on the right labia. There is no rash or tenderness on the left labia.    No vaginal discharge or erythema.  No erythema in the vagina.  Musculoskeletal:        General: No edema.     Lumbar back: She exhibits no tenderness.   Lymphadenopathy:    She has no cervical adenopathy.  Neurological: She is alert and oriented to person, place, and time. Gait normal.  Patellar DTRs 1-2+, symmetric.  Skin: Skin is warm. No rash noted. No erythema.  Psychiatric: Her speech is normal. Her mood appears anxious.  Well groomed,good eye contact.     ASSESSMENT AND PLAN:  Veronica [name] was seen today for back pain and bladder pain.  Diagnoses and all orders for this visit:  Lab Results  Component Value Date   CHOL 211 (H) 06/27/2018   HDL 43.50 06/27/2018   LDLCALC 136 (H) 06/27/2018   LDLDIRECT 161.6 06/03/2012   TRIG 161.0 (H) 06/27/2018   CHOLHDL 5 06/27/2018   Lab Results  Component Value Date   CREATININE 0.62 04/01/2018   BUN 8 04/01/2018   NA 138 04/01/2018   K 3.7 04/01/2018   CL 106 04/01/2018   CO2 24 04/01/2018    Low back pain without sciatica, unspecified back pain laterality, unspecified chronicity Could be related to her already chronic back pain. U/A is otherwise normal.  She wold like urine send for Cx. Adequate hydration. Instructed about warning signs.  -     POC Urinalysis Dipstick  Perineal pain in female ? Radicular pain. After discussing some side effecra,she agrees with taking  Prednisone taper. Instructed about warning sings. If pain is not better will consider lumbar MRI.  -     predniSONE (DELTASONE) 20 MG tablet; 3 tabs for 3 days, 2 tabs for 3 days, 1 tabs for 3 days, and 1/2 tab for 3 days. Take tables together with breakfast. -     CBC -     Culture, Urine  Hyperlipidemia, unspecified hyperlipidemia type No changes in current management, will follow labs done today and will give further recommendations accordingly.  -     Lipid panel        Return if symptoms worsen or fail to improve.     -Veronica [name] was advised to seek immediate medical attention if sudden worsening symptoms or to follow if they persist or if new concerns arise.         G. Martinique, MD  [facility]. [place] office.

## 2018-06-29 LAB — URINE CULTURE
MICRO NUMBER:: 519012
SPECIMEN QUALITY:: ADEQUATE

## 2018-06-30 ENCOUNTER — Encounter: Payer: Self-pay | Admitting: Family Medicine

## 2018-07-04 ENCOUNTER — Other Ambulatory Visit: Payer: Self-pay | Admitting: Family Medicine

## 2018-07-04 DIAGNOSIS — E785 Hyperlipidemia, unspecified: Secondary | ICD-10-CM

## 2018-07-04 MED ORDER — PRAVASTATIN SODIUM 40 MG PO TABS: 40.0000 mg | ORAL_TABLET | Freq: Every day | ORAL | 1 refills | Status: DC

## 2018-07-07 ENCOUNTER — Encounter: Payer: Self-pay | Admitting: *Deleted

## 2018-07-15 ENCOUNTER — Other Ambulatory Visit: Payer: Self-pay | Admitting: Family Medicine

## 2018-07-15 DIAGNOSIS — F411 Generalized anxiety disorder: Secondary | ICD-10-CM

## 2018-08-12 ENCOUNTER — Ambulatory Visit (INDEPENDENT_AMBULATORY_CARE_PROVIDER_SITE_OTHER): Payer: Medicare Other | Admitting: Family Medicine

## 2018-08-12 ENCOUNTER — Telehealth: Payer: Self-pay | Admitting: Family Medicine

## 2018-08-12 ENCOUNTER — Encounter: Payer: Self-pay | Admitting: Family Medicine

## 2018-08-12 ENCOUNTER — Other Ambulatory Visit: Payer: Self-pay

## 2018-08-12 DIAGNOSIS — M5416 Radiculopathy, lumbar region: Secondary | ICD-10-CM

## 2018-08-12 DIAGNOSIS — F411 Generalized anxiety disorder: Secondary | ICD-10-CM | POA: Diagnosis not present

## 2018-08-12 MED ORDER — GABAPENTIN 300 MG PO CAPS: 300.0000 mg | ORAL_CAPSULE | Freq: Every day | ORAL | 0 refills | Status: DC

## 2018-08-12 NOTE — Assessment & Plan Note (Signed)
She is not interested in Cymbalta because she would like to continue taking sertraline for anxiety. Gabapentin dose increased from 100 mg to 300 mg at bedtime. Side effect discussed. Scat form will be completed.

## 2018-08-12 NOTE — Assessment & Plan Note (Addendum)
Problem is well controlled with sertraline 50 mg daily. She would like to continue same medication. Also continue Xanax 0.5 mg bid prn. Some side effect discussed.

## 2018-08-12 NOTE — Telephone Encounter (Signed)
Patient came by and dropped off forms that needs to be completed for SCAT transportation. Forms will be in provider's folder at the front. Please call patient once forms are completed

## 2018-08-12 NOTE — Progress Notes (Signed)
Virtual Visit via Video Note   I connected with Veronica [name] on 08/12/18 at  9:00 AM EDT by a video enabled telemedicine application and verified that I am speaking with the correct person using two identifiers.  Location patient: home Location provider:work office Persons participating in the virtual visit: patient, provider  I discussed the limitations of evaluation and management by telemedicine and the availability of in person appointments. The patient expressed understanding and agreed to proceed.   HPI: Veronica [name] is a 68 yo female with Hx of HTN and anxiety who is complaining of constant lower back pain radiated to her left lower extremity. Intermittent numbness of left thigh. She has not noted a rash. No recent trauma. Problem is chronic, exacerbated by prolonged walking/standing. It is alleviated by rest. She is taking OTC acetaminophen. Gabapentin 100 mg at bedtime, which she has been tolerating well.  She denies saddle anesthesia, urine/bowel incontinence, or lower extremity focal weakness.  She denies fever, chills, unusual body aches, skin rash, abdominal pain, nausea, vomiting, changes in bowel habits, dysuria, gross hematuria, or decreased urine output.  She would like to continue SCAT service.She had service within this past year and helped to prevent back pain exacerbations. She works part time,she does not have transportation (afraid of driving). 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  She is currently on sertraline 50 mg daily to treat anxiety. She is tolerating medication well. She denies depressed mood or suicidal thoughts. She also takes alprazolam 0.5 mg twice daily as needed.   ROS: See pertinent positives and negatives per HPI.  Past Medical History:  Diagnosis Date  . Allergic rhinitis   . Anxiety   . Anxiety and depression   . Constipation, chronic   . Coronary artery disease    06-2010, had  CP----> Mild nonobstructive CAD on cardiac CT   . Hemorrhoids   . Hip pain, bilateral   . Hyperlipidemia   . Hypertension   . Leukoplakia of vagina   . Low back pain    w/ left side radiculopathy, s/p injection at L4-L5 forarmen 1/06  . Uterine fibroid     Past Surgical History:  Procedure Laterality Date  . ABDOMINAL HYSTERECTOMY  11-2000   no oophorectomy per pt  . CHOLECYSTECTOMY  12/08/2010   Procedure: LAPAROSCOPIC CHOLECYSTECTOMY WITH INTRAOPERATIVE CHOLANGIOGRAM;  Surgeon: Pedro Earls, MD;  Location: [facility];  Service: General;  Laterality: N/A;  c-arm   . COLONOSCOPY WITH PROPOFOL N/A 02/02/2014   Procedure: COLONOSCOPY WITH PROPOFOL;  Surgeon: Garlan Fair, MD;  Location: [facility];  Service: Endoscopy;  Laterality: N/A;  . LEEP     unknown date   . TUBAL LIGATION     bilateral 1989    Family History  Problem Relation Age of Onset  . Schizophrenia Son   . Schizophrenia Brother   . Schizophrenia Other        2 nieces  . Diabetes Neg Hx   . Heart attack Neg Hx   . Colon cancer Neg Hx   . Breast cancer Neg Hx     Social History   Socioeconomic History  . Marital status: Divorced    Spouse name: Not on file  . Number of children: 3  . Years of education: Not on file  . Highest education level: Not on file  Occupational History  . 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 Financial resource strain: Not on file  . Food insecurity    Worry: Not on file    Inability: Not on file  . Transportation needs    Medical: Not on file    Non-medical: Not on file  Tobacco Use  . Smoking status: Never Smoker  . Smokeless tobacco: Never Used  Substance and Sexual Activity  . Alcohol use: Yes    Comment: rarely  . Drug use: No  . Sexual activity: Never    Birth control/protection: Surgical  Lifestyle  . Physical activity    Days per week: Not on file    Minutes per session: Not on file  .  Stress: Not on file  Relationships  . Social Herbalist on phone: Not on file    Gets together: Not on file    Attends religious service: Not on file    Active member of club or organization: Not on file    Attends meetings of clubs or organizations: Not on file    Relationship status: Not on file  . Intimate partner violence    Fear of current or ex partner: Not on file    Emotionally abused: Not on file    Physically abused: Not on file    Forced sexual activity: Not on file  Other Topics Concern  . Not on file  Social History Narrative   Divorced, lives w/ 1 of her children   Original from [place], living in [place] since age 76                       Current Outpatient Medications:  .  ALPRAZolam (XANAX) 0.5 MG tablet, Take 1 tablet (0.5 mg total) by mouth 2 (two) times daily as needed for anxiety., Disp: 10 tablet, Rfl: 0 .  amLODipine (NORVASC) 5 MG tablet, TAKE 1 TABLET(5 MG) BY MOUTH EVERY MORNING, Disp: 90 tablet, Rfl: 1 .  fexofenadine (ALLEGRA ALLERGY) 180 MG tablet, Take 1 tablet (180 mg total) by mouth daily., Disp: 90 tablet, Rfl: 3 .  fluticasone (FLONASE) 50 MCG/ACT nasal spray, Place 1 spray into both nostrils 2 (two) times daily. (Patient taking differently: Place 1 spray into both nostrils as needed. ), Disp: 16 g, Rfl: 3 .  gabapentin (NEURONTIN) 300 MG capsule, Take 1 capsule (300 mg total) by mouth at bedtime., Disp: 90 capsule, Rfl: 0 .  pravastatin (PRAVACHOL) 40 MG tablet, Take 1 tablet (40 mg total) by mouth daily., Disp: 90 tablet, Rfl: 1 .  sertraline (ZOLOFT) 50 MG tablet, TAKE 1 TABLET BY MOUTH DAILY, Disp: 90 tablet, Rfl: 0  EXAM:  VITALS per patient if applicable: N/A  GENERAL: alert, oriented, appears well and in no acute distress  HEENT: atraumatic, conjunctiva clear, no obvious facial abnormalities on inspection.  LUNGS: on inspection no signs of respiratory distress, breathing rate appears normal, no obvious gross SOB, gasping or  wheezing  CV: no obvious cyanosis  Veronica: moves all visible extremities without noticeable abnormality  PSYCH/NEURO: pleasant and cooperative, no obvious depression or anxiety, speech and thought processing grossly intact  ASSESSMENT AND PLAN:  Discussed the following assessment and plan:  Anxiety disorder Problem is well controlled with sertraline 50 mg daily. She would like to continue same medication. Also continue Xanax 0.5 mg bid prn. Some side effect discussed.  Lumbar back pain with radiculopathy affecting left lower extremity She is not interested in Cymbalta because she would  like to continue taking sertraline for anxiety. Gabapentin dose increased from 100 mg to 300 mg at bedtime. Side effect discussed. Scat form will be completed.  She will drop off SCAT form to be completed.    I discussed the assessment and treatment plan with the patient. She was provided an opportunity to ask questions and all were answered.She agreed with the plan and demonstrated an understanding of the instructions.    Return in about 6 months (around 02/12/2019).    Nandi Tonnesen Martinique, MD

## 2018-08-15 DIAGNOSIS — Z0279 Encounter for issue of other medical certificate: Secondary | ICD-10-CM

## 2018-08-15 NOTE — Telephone Encounter (Signed)
Form given to provider for completion

## 2018-08-18 NOTE — Telephone Encounter (Signed)
Pt came in and picked up form and paid the $29.00 form fee by credit card

## 2018-08-18 NOTE — Telephone Encounter (Signed)
Called patient and informed them that form is completed and ready for pick up. Patient aware of the $ 29 fee for form. Patient stated that she will call number when she is outside.

## 2018-09-18 ENCOUNTER — Other Ambulatory Visit: Payer: Self-pay | Admitting: Family Medicine

## 2018-10-02 ENCOUNTER — Telehealth: Payer: Self-pay | Admitting: Family Medicine

## 2018-10-02 ENCOUNTER — Telehealth: Payer: Self-pay

## 2018-10-02 DIAGNOSIS — M5416 Radiculopathy, lumbar region: Secondary | ICD-10-CM

## 2018-10-02 NOTE — Telephone Encounter (Signed)
Veronica [name] from [facility] called requesting PT prescription. Veronica [name] is schedule to see patient this afternoon 9/3.  Veronica [name] call back [phone]

## 2018-10-02 NOTE — Telephone Encounter (Signed)
Verbal PT order given. Veronica [name] reports he will need a rx for this by next week. Please advise

## 2018-10-02 NOTE — Telephone Encounter (Signed)
Pt calling to check on the status of this referral.  Informed her it could take one to two weeks to process.  Pt states she needs this done immediately and would like to have Dr. Doug Sou nurse call her back.

## 2018-10-02 NOTE — Telephone Encounter (Signed)
Copied from [place] [id number]. Topic: Referral - Request for Referral >> Oct 02, 2018 10:10 AM Ivar Drape wrote: Has patient seen PCP for this complaint? YES *If NO, is insurance requiring patient see PCP for this issue before PCP can refer them? Referral for which specialty:  Ortho Preferred provider/office: [facility] in [facility] Reason for referral:  Lower Back problems

## 2018-10-03 NOTE — Telephone Encounter (Signed)
Referral can be placed as requested by pt. [I do not think this is a high priority message,she has a long hx of back pain] unless she is having saddle anesthesia (numbness or pain in perineal area) or new onset of fecal/urine incontinence,in which case she needs to be evaluated in the ER.  BJ

## 2018-10-03 NOTE — Telephone Encounter (Signed)
Do not see a referral placed for ortho. Reaching out to PCP for okay.

## 2018-10-03 NOTE — Telephone Encounter (Signed)
HH? She was asking for ortho referral,which is going to be placed. If she also wants referral for PT this can be placed but I recommend waiting for ortho evaluation.  Thanks, BJ

## 2018-10-07 ENCOUNTER — Encounter: Payer: Self-pay | Admitting: Family Medicine

## 2018-10-07 ENCOUNTER — Ambulatory Visit: Payer: Medicare Other | Admitting: Family Medicine

## 2018-10-07 ENCOUNTER — Other Ambulatory Visit: Payer: Self-pay

## 2018-10-07 VITALS — BP 130/78 | HR 67 | Temp 98.3°F | Resp 12 | Ht 66.0 in | Wt 217.2 lb

## 2018-10-07 DIAGNOSIS — R209 Unspecified disturbances of skin sensation: Secondary | ICD-10-CM | POA: Diagnosis not present

## 2018-10-07 DIAGNOSIS — E785 Hyperlipidemia, unspecified: Secondary | ICD-10-CM

## 2018-10-07 DIAGNOSIS — M549 Dorsalgia, unspecified: Secondary | ICD-10-CM | POA: Diagnosis not present

## 2018-10-07 DIAGNOSIS — M5416 Radiculopathy, lumbar region: Secondary | ICD-10-CM | POA: Diagnosis not present

## 2018-10-07 LAB — LIPID PANEL
Cholesterol: 170 mg/dL (ref 0–200)
HDL: 46 mg/dL (ref >39.00)
LDL Cholesterol: 91 mg/dL (ref 0–99)
NonHDL: 124.44
Total CHOL/HDL Ratio: 4
Triglycerides: 166 mg/dL — ABNORMAL HIGH (ref 0.0–149.0)
VLDL: 33.2 mg/dL (ref 0.0–40.0)

## 2018-10-07 MED ORDER — TIZANIDINE HCL 4 MG PO TABS: 4.0000 mg | ORAL_TABLET | Freq: Every day | ORAL | 0 refills | Status: AC

## 2018-10-07 MED ORDER — PRAVASTATIN SODIUM 40 MG PO TABS: 40.0000 mg | ORAL_TABLET | Freq: Every day | ORAL | 2 refills | Status: DC

## 2018-10-07 MED ORDER — PREDNISONE 20 MG PO TABS: ORAL_TABLET | ORAL | 0 refills | Status: AC

## 2018-10-07 NOTE — Patient Instructions (Signed)
A few things to remember from today's visit:   Lumbar back pain with radiculopathy affecting left lower extremity  Upper back pain on left side - Plan: predniSONE (DELTASONE) 20 MG tablet, tiZANidine (ZANAFLEX) 4 MG tablet  Skin sensation disturbance - Plan: predniSONE (DELTASONE) 20 MG tablet  Take gabapentin 300 mg twice daily. Take prednisone with food. Pending appointment with orthopedist as requested.  Please be sure medication list is accurate. If a new problem present, please set up appointment sooner than planned today.

## 2018-10-07 NOTE — Telephone Encounter (Signed)
[facility] is calling again because they do not have an order for patient to have PT.  Please call to clarify at [phone]

## 2018-10-07 NOTE — Progress Notes (Signed)
HPI:  Chief Complaint  Patient presents with  . Back Pain    Ms.BREAH DEFUSCO is a 68 y.o. female, who is here today complaining of left upper back pain, which she reported as a new problem. She has history of lower back pain radiates to left lower extremity. She recently requested referral to orthopedist, appointment is pending.  Left upper back pain and burning/sharp sensation is started about 10 days ago.  Pain is radiated to left shoulder, constant,moderate burning like sensation. No history of trauma or unusual physical activity. Pain is exacerbated by certain movements. She has not identified alleviating factors.  No rash or edema on area, fever, chills, or abnormal wt loss.  Lower back pain is not associated LE numbness, tingling, urinary incontinence or retention, stool incontinence, or saddle anesthesia. It is constant, exacerbated by prolonged walking and standing. Alleviated by rest.  Currently she is on gabapentin 300 mg, which is supposed to be taken 3 times daily but taking as needed.  She is undergoing PT, which has helped with lower back pain.  She also needs SCAT form reviewed because it was not accepted.  She does not drive, she requested scat service because she has a long walk from bus stop to work if she uses regular transportation   OTC medications: Not taking any at this time.  Hyperlipidemia, recently lovastatin was changed in 05/2018 to pravastatin 40 mg due to side effects (myalgias). She states that she is still receiving lovastatin at her pharmacy. She would like to have lipid panel checked today.  Denies severe/frequent headache, chest pain, dyspnea, palpitation, claudication, focal weakness, or edema.  Lab Results  Component Value Date   CHOL 211 (H) 06/27/2018   HDL 43.50 06/27/2018   LDLCALC 136 (H) 06/27/2018   LDLDIRECT 161.6 06/03/2012   TRIG 161.0 (H) 06/27/2018   CHOLHDL 5 06/27/2018    Review of Systems   Constitutional: Negative for activity change and appetite change.  Respiratory: Negative for cough, shortness of breath and wheezing.   Gastrointestinal: Negative for abdominal pain, nausea and vomiting.       No changes in bowel habits.  Genitourinary: Negative for decreased urine volume and hematuria.  Musculoskeletal: Positive for arthralgias. Negative for gait problem.  Neurological: Negative for syncope and facial asymmetry.  Psychiatric/Behavioral: Negative for confusion. The patient is nervous/anxious.   Rest see pertinent positives and negatives per HPI.   Current Outpatient Medications on File Prior to Visit  Medication Sig Dispense Refill  . ALPRAZolam (XANAX) 0.5 MG tablet Take 1 tablet (0.5 mg total) by mouth 2 (two) times daily as needed for anxiety. (Patient not taking: Reported on 10/07/2018) 10 tablet 0  . amLODipine (NORVASC) 5 MG tablet TAKE 1 TABLET(5 MG) BY MOUTH EVERY MORNING (Patient not taking: Reported on 10/07/2018) 90 tablet 1  . fexofenadine (ALLEGRA ALLERGY) 180 MG tablet Take 1 tablet (180 mg total) by mouth daily. (Patient not taking: Reported on 10/07/2018) 90 tablet 3  . fluticasone (FLONASE) 50 MCG/ACT nasal spray Place 1 spray into both nostrils 2 (two) times daily. (Patient not taking: Reported on 10/07/2018) 16 g 3  . gabapentin (NEURONTIN) 300 MG capsule Take 1 capsule (300 mg total) by mouth at bedtime. (Patient not taking: Reported on 10/07/2018) 90 capsule 0  . sertraline (ZOLOFT) 50 MG tablet TAKE 1 TABLET BY MOUTH DAILY (Patient not taking: Reported on 10/07/2018) 90 tablet 0   No current facility-administered medications on file prior to visit.  Past Medical History:  Diagnosis Date  . Allergic rhinitis   . Anxiety   . Anxiety and depression   . Constipation, chronic   . Coronary artery disease    06-2010, had CP----> Mild nonobstructive CAD on cardiac CT   . Hemorrhoids   . Hip pain, bilateral   . Hyperlipidemia   . Hypertension   . Leukoplakia  of vagina   . Low back pain    w/ left side radiculopathy, s/p injection at L4-L5 forarmen 1/06  . Uterine fibroid    No Known Allergies  Social History   Socioeconomic History  . Marital status: Divorced    Spouse name: Not on file  . Number of children: 3  . Years of education: Not on file  . Highest education level: Not on file  Occupational History  . 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 Financial resource strain: Not on file  . Food insecurity    Worry: Not on file    Inability: Not on file  . Transportation needs    Medical: Not on file    Non-medical: Not on file  Tobacco Use  . Smoking status: Never Smoker  . Smokeless tobacco: Never Used  Substance and Sexual Activity  . Alcohol use: Yes    Comment: rarely  . Drug use: No  . Sexual activity: Never    Birth control/protection: Surgical  Lifestyle  . Physical activity    Days per week: Not on file    Minutes per session: Not on file  . Stress: Not on file  Relationships  . Social Herbalist on phone: Not on file    Gets together: Not on file    Attends religious service: Not on file    Active member of club or organization: Not on file    Attends meetings of clubs or organizations: Not on file    Relationship status: Not on file  Other Topics Concern  . Not on file  Social History Narrative   Divorced, lives w/ 1 of her children   Original from [place], living in [place] since age 59                      Vitals:   10/07/18 1014  BP: 130/78  Pulse: 67  Resp: 12  Temp: 98.3 F (36.8 C)  SpO2: 94%   Body mass index is 35.06 kg/m.  Wt Readings from Last 3 Encounters:  10/07/18 217 lb 3.2 oz (98.5 kg)  06/27/18 216 lb 4 oz (98.1 kg)  03/26/18 223 lb 4 oz (101.3 kg)    Physical Exam  Nursing note and vitals reviewed. Constitutional: She is oriented to person, place, and time. She appears well-developed. No distress.   HENT:  Head: Normocephalic and atraumatic.  Eyes: Conjunctivae are normal.  Cardiovascular: Normal rate and regular rhythm.  No murmur heard. Pulses:      Dorsalis pedis pulses are 2+ on the right side and 2+ on the left side.  Respiratory: Effort normal and breath sounds normal. No respiratory distress.  GI: Soft. She exhibits no mass. There is no hepatomegaly. There is no abdominal tenderness.  Musculoskeletal:        General: No edema.     Right shoulder: She exhibits normal range of motion.     Left shoulder: She exhibits normal range of motion.       Back:  Comments: Abduction of left shoulder elicits left upper back pain.  Lymphadenopathy:    She has no cervical adenopathy.  Neurological: She is alert and oriented to person, place, and time. She has normal strength. No cranial nerve deficit. Gait normal.  Skin: Skin is warm. No rash noted. No erythema.  Psychiatric: Her mood appears anxious.  Well groomed, good eye contact.   ASSESSMENT AND PLAN:  Ms. Edie was seen today for back pain.   Diagnoses and all orders for this visit: Lab Results  Component Value Date   CHOL 170 10/07/2018   HDL 46.00 10/07/2018   LDLCALC 91 10/07/2018   LDLDIRECT 161.6 06/03/2012   TRIG 166.0 (H) 10/07/2018   CHOLHDL 4 10/07/2018    Upper back pain on left side She is reporting this is a new problem. We discussed some side effects of Zanaflex. Because of associated burning sensation and radiation to left shoulder, I recommended prednisone taper. Local massage/ice may also help. No history of trauma, so I do not think imaging is needed at this time.  -     predniSONE (DELTASONE) 20 MG tablet; 3 tabs for 3 days, 2 tabs for 3 days, 1 tabs for 3 days, and 1/2 tab for 3 days. Take tables together with breakfast. -     tiZANidine (ZANAFLEX) 4 MG tablet; Take 1 tablet (4 mg total) by mouth at bedtime.  Lumbar back pain with radiculopathy affecting left lower extremity This is a chronic  problem. Recommend taking gabapentin 300 mg twice daily, she can increase dose to 3 times daily if well-tolerated. Ortho appointment is pending.  Skin sensation disturbance ? Radicular pain. We discussed other possible etiologies. After discussion of side effects of prednisone, she agrees with prednisone taper. Recommend taking medication with food. Instructed about warning signs.  -     predniSONE (DELTASONE) 20 MG tablet; 3 tabs for 3 days, 2 tabs for 3 days, 1 tabs for 3 days, and 1/2 tab for 3 days. Take tables together with breakfast.  Hyperlipidemia, unspecified hyperlipidemia type Pravastatin 40 mg to take daily, prescription sent to her pharmacy. We will  follow labs done today and will give further recommendations accordingly.  -     Lipid panel -     pravastatin (PRAVACHOL) 40 MG tablet; Take 1 tablet (40 mg total) by mouth daily.  Return if symptoms worsen or fail to improve, for Keep f/u appt.     Debarah Mccumbers G. [place], MD  [facility] [place] office.

## 2018-10-08 NOTE — Telephone Encounter (Signed)
Lm for pt as an update

## 2018-10-08 NOTE — Telephone Encounter (Signed)
Referral has been placed. 

## 2018-10-09 NOTE — Telephone Encounter (Signed)
Orders have been faxed to [facility].

## 2018-11-05 ENCOUNTER — Other Ambulatory Visit: Payer: Self-pay

## 2018-11-05 ENCOUNTER — Encounter: Payer: Self-pay | Admitting: Family Medicine

## 2018-11-05 ENCOUNTER — Ambulatory Visit: Payer: Medicare Other | Admitting: Family Medicine

## 2018-11-05 VITALS — BP 130/80 | HR 64 | Temp 97.8°F | Resp 12 | Ht 66.0 in | Wt 218.2 lb

## 2018-11-05 DIAGNOSIS — I10 Essential (primary) hypertension: Secondary | ICD-10-CM | POA: Diagnosis not present

## 2018-11-05 DIAGNOSIS — F411 Generalized anxiety disorder: Secondary | ICD-10-CM

## 2018-11-05 DIAGNOSIS — M5416 Radiculopathy, lumbar region: Secondary | ICD-10-CM | POA: Diagnosis not present

## 2018-11-05 MED ORDER — SERTRALINE HCL 50 MG PO TABS: ORAL_TABLET | ORAL | 0 refills | Status: DC

## 2018-11-05 MED ORDER — DULOXETINE HCL 30 MG PO CPEP: 30.0000 mg | ORAL_CAPSULE | Freq: Every day | ORAL | 1 refills | Status: DC

## 2018-11-05 MED ORDER — GABAPENTIN 300 MG PO CAPS: 300.0000 mg | ORAL_CAPSULE | Freq: Every day | ORAL | 3 refills | Status: DC

## 2018-11-05 NOTE — Patient Instructions (Signed)
A few things to remember from today's visit:   Lumbar back pain with radiculopathy affecting left lower extremity - Plan: DULoxetine (CYMBALTA) 30 MG capsule, gabapentin (NEURONTIN) 300 MG capsule  Essential hypertension  Generalized anxiety disorder - Plan: sertraline (ZOLOFT) 50 MG tablet  Today Cymbalta 30 mg added and we will start weaning off Sertraline.  Please be sure medication list is accurate. If a new problem present, please set up appointment sooner than planned today.

## 2018-11-05 NOTE — Progress Notes (Signed)
HPI:  Chief Complaint  Patient presents with  . Back Pain    sciatic nerve pain    [name] is a 68 y.o. female, who is here today complaining of lower back pain. This is a chronic problem , getting worse. She was last seen on 10/07/18, when she was c/o new onset of left upper burning like pain, treated with Prednisone taper.This has improved. She was also referred to orthopedist as her request.  Back pain is bilateral but left-sided pain is getting worse.  Exacerbated by prolonged walking and standing. This is the reason she is requesting SCAT transportation. She doe snot drive and need transportation to be able to work part time.  + Stiffness. Pain is radiated to left thigh with burning and intermittent numbness sensation that is exacerbated by prolonged walking. Left thigh numbness was resolved when she was using SCAT transportation, decreasing the need for prolonged walking.  Symptoms alleviated by rest. Pain is severe, 7-8/10.  Denies saddle anesthesia and bladder/bowel dysfunction.  She is having ongoing PT and it has helped.  She is also requesting another SCAT forms to be completed, this is the 3rd one, it has not been approved. She brings a paper with a list of symptoms she has with prolonged walking,she would like for me to write this "word by word" on SCAT form, hoping this time it would be approved.  She is on Gabapentin 300 mg at bedtime, which has helped with LLE numbness and pain.  Concerned about BP readings at home. Sometimes BP 150's/80's. Elevated BP's exacerbated by high salt intake,cheese, and when she eats out. Currently she is on Amlodipine 5 mg daily. Denies severe/frequent headache, visual changes, chest pain, dyspnea, palpitation, claudication, focal weakness, or edema.  Anxiety: She is on Sertraline 50 mg and Alprazolam 0.5 mg bid prn. She feels like medications are helping with symptom. No suicidal thoughts or depressed mood.  Review of Systems  Constitutional: Negative for activity change, appetite change, fatigue, fever and unexpected weight change.  HENT: Negative for mouth sores, nosebleeds and trouble swallowing.   Eyes: Negative for redness and visual disturbance.  Respiratory: Negative for cough and wheezing.   Gastrointestinal: Negative for abdominal pain, nausea and vomiting.       Negative for changes in bowel habits.  Genitourinary: Negative for decreased urine volume, dysuria and hematuria.  Skin: Negative for rash and wound.  Neurological: Negative for syncope and facial asymmetry.  Psychiatric/Behavioral: Negative for confusion. The patient is nervous/anxious.   Rest see pertinent positives and negatives per HPI.   Current Outpatient Medications on File Prior to Visit  Medication Sig Dispense Refill  . ALPRAZolam (XANAX) 0.5 MG tablet Take 1 tablet (0.5 mg total) by mouth 2 (two) times daily as needed for anxiety. 10 tablet 0  . amLODipine (NORVASC) 5 MG tablet TAKE 1 TABLET(5 MG) BY MOUTH EVERY MORNING 90 tablet 1  . fexofenadine (ALLEGRA ALLERGY) 180 MG tablet Take 1 tablet (180 mg total) by mouth daily. (Patient taking differently: Take 180 mg by mouth as needed. ) 90 tablet 3  . fluticasone (FLONASE) 50 MCG/ACT nasal spray Place 1 spray into both nostrils 2 (two) times daily. (Patient taking differently: Place 1 spray into both nostrils as needed. ) 16 g 3  . pravastatin (PRAVACHOL) 40 MG tablet Take 1 tablet (40 mg total) by mouth daily. 90 tablet 2   No current facility-administered medications on file prior to visit.  Past Medical History:  Diagnosis Date  . Allergic rhinitis   . Anxiety   . Anxiety and depression   . Constipation, chronic   . Coronary artery disease    06-2010, had CP----> Mild nonobstructive CAD on cardiac CT   . Hemorrhoids   . Hip pain, bilateral   . Hyperlipidemia   . Hypertension   . Leukoplakia of vagina   . Low back pain    w/ left side  radiculopathy, s/p injection at L4-L5 forarmen 1/06  . Uterine fibroid    No Known Allergies  Social History   Socioeconomic History  . Marital status: Divorced    Spouse name: Not on file  . Number of children: 3  . Years of education: Not on file  . Highest education level: Not on file  Occupational History  . 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 Financial resource strain: Not on file  . Food insecurity    Worry: Not on file    Inability: Not on file  . Transportation needs    Medical: Not on file    Non-medical: Not on file  Tobacco Use  . Smoking status: Never Smoker  . Smokeless tobacco: Never Used  Substance and Sexual Activity  . Alcohol use: Yes    Comment: rarely  . Drug use: No  . Sexual activity: Never    Birth control/protection: Surgical  Lifestyle  . Physical activity    Days per week: Not on file    Minutes per session: Not on file  . Stress: Not on file  Relationships  . Social Herbalist on phone: Not on file    Gets together: Not on file    Attends religious service: Not on file    Active member of club or organization: Not on file    Attends meetings of clubs or organizations: Not on file    Relationship status: Not on file  Other Topics Concern  . Not on file  Social History Narrative   Divorced, lives w/ 1 of her children   Original from [place], living in [place] since age 26                      Vitals:   11/05/18 1056  BP: 130/80  Pulse: 64  Resp: 12  Temp: 97.8 F (36.6 C)  SpO2: 98%   Body mass index is 35.23 kg/m.  Physical Exam  Nursing note and vitals reviewed. Constitutional: She is oriented to person, place, and time. She appears well-developed. No distress.  HENT:  Head: Normocephalic and atraumatic.  Mouth/Throat: Oropharynx is clear and moist and mucous membranes are normal.  Eyes: Pupils are equal, round, and reactive to light.  Conjunctivae are normal.  Cardiovascular: Normal rate and regular rhythm.  No murmur heard. Pulses:      Dorsalis pedis pulses are 2+ on the right side and 2+ on the left side.  Respiratory: Effort normal and breath sounds normal. No respiratory distress.  GI: Soft. She exhibits no mass. There is no hepatomegaly. There is no abdominal tenderness.  Musculoskeletal:        General: No edema.     Lumbar back: She exhibits tenderness. She exhibits no bony tenderness.       Back:  Lymphadenopathy:    She has no cervical adenopathy.  Neurological: She is alert and oriented to person, place, and time. She  has normal strength. No cranial nerve deficit.  Antalgic gait, not assisted. SLR negative,bilateral.  Skin: Skin is warm. No rash noted. No erythema.  Psychiatric: Her mood appears anxious.  Well groomed, good eye contact.    ASSESSMENT AND PLAN:  Ms. Addiley was seen today for back pain.  Diagnoses and all orders for this visit:  Lumbar back pain with radiculopathy affecting left lower extremity Problem is getting worse. She is not interested in invasive treatments at this time. Problem improves when she avoid prolonged walking or standing. PT has helped,so to continue. Pending appt with ortho.  After discussion of some side effects,she agrees with trying Cymbalta 30 mg. Continue Gabapentin 300 mg at bedtime.  -     DULoxetine (CYMBALTA) 30 MG capsule; Take 1 capsule (30 mg total) by mouth daily. -     gabapentin (NEURONTIN) 300 MG capsule; Take 1 capsule (300 mg total) by mouth at bedtime.  Essential hypertension BP today adequately controlled. Low salt diet recommended. For now no changes in current management. Continue monitoring BP, goal < 140/90.  Generalized anxiety disorder Well controlled but because Cymbalta 30 mg was started,recommended starting weaning Sertralin off. Sertraline decreased from 50 mg to 25 mg. Instructed about warning signs. No changes in  Alprazolam.  -     sertraline (ZOLOFT) 50 MG tablet; TAKE 1/2 TABLET BY MOUTH DAILY   Return in about 2 months (around 01/05/2019) for Back pain and anxiety.    Vivan Agostino G. [place], MD  [facility] [place] office.

## 2018-11-09 ENCOUNTER — Encounter: Payer: Self-pay | Admitting: Family Medicine

## 2018-11-10 ENCOUNTER — Other Ambulatory Visit: Payer: Self-pay | Admitting: Family Medicine

## 2018-11-10 ENCOUNTER — Encounter: Payer: Self-pay | Admitting: Family Medicine

## 2018-11-10 DIAGNOSIS — I10 Essential (primary) hypertension: Secondary | ICD-10-CM

## 2018-11-10 MED ORDER — LOSARTAN POTASSIUM 25 MG PO TABS: 25.0000 mg | ORAL_TABLET | Freq: Every day | ORAL | 3 refills | Status: DC

## 2018-11-17 ENCOUNTER — Telehealth: Payer: Self-pay | Admitting: *Deleted

## 2018-11-17 NOTE — Telephone Encounter (Signed)
Copied from [place] [id number]. Topic: General - Other >> Nov 17, 2018 12:08 PM Celene Kras A wrote: Reason for CRM: Almedia Balls, from [employer], called stating she is needing to have some questions answered for pts paperwork for scat transportation services. Please advise.

## 2018-11-28 NOTE — Telephone Encounter (Signed)
Left message to return phone call.

## 2018-12-01 NOTE — Telephone Encounter (Signed)
Copied from [place] [id number]. Topic: General - Other >> Dec 01, 2018 12:36 PM [name] wrote: Reason for CRM: Patient called to to speak to Dr Nicki Reaper nurse in reference to her SCAT application that was sent in. Please call patient at Ph# [phone] >> Dec 01, 2018  1:02 PM Cox, Melburn Hake, CMA wrote: Patient of [place] not [place]

## 2018-12-01 NOTE — Telephone Encounter (Signed)
Copied from [place] [phone]. Topic: General - Other >> Dec 01, 2018 12:36 PM [name] wrote: Reason for CRM: Patient called to to speak to Dr Nicki Reaper nurse in reference to her SCAT application that was sent in. Please call patient at Ph# [phone] >> Dec 01, 2018  3:36 PM [name] wrote: Patient is returning [place] call Call back [phone] >> Dec 01, 2018  1:02 PM Cox, Melburn Hake, CMA wrote: Patient of [name]

## 2018-12-01 NOTE — Telephone Encounter (Signed)
Left message for patient to call back  

## 2018-12-02 NOTE — Telephone Encounter (Addendum)
Application was incomplete. It stated that the diagnosis was not provided.  Patient has until Nov. 16 to complete this.  Patient is going to drop the form off tomorrow.

## 2018-12-05 NOTE — Telephone Encounter (Signed)
Dx is correct: Lower back pain with radicular pain("lower back pain radiated to left leg."  I have completed her SCAT forms 3 times. Last time I did write every thing she wanted me to write, word by word as she requested.  With all due respect I think forms were completed correctly, based on hx and clinical finding, I do not think I can add more at this time.  Thanks, BJ

## 2018-12-08 NOTE — Telephone Encounter (Signed)
Spoke with patient. Informed her of Dr. Doug Sou message. Patient verbalized understanding. Patient verbalized she will let SCAT know if they have anymore questions they can contact the office.

## 2018-12-11 ENCOUNTER — Ambulatory Visit: Payer: Medicare Other | Admitting: Cardiology

## 2018-12-12 ENCOUNTER — Ambulatory Visit: Payer: Medicare Other | Admitting: Cardiology

## 2018-12-12 ENCOUNTER — Other Ambulatory Visit: Payer: Self-pay

## 2018-12-12 ENCOUNTER — Encounter: Payer: Self-pay | Admitting: Cardiology

## 2018-12-12 VITALS — BP 127/79 | HR 70 | Ht 65.0 in | Wt 217.0 lb

## 2018-12-12 DIAGNOSIS — E782 Mixed hyperlipidemia: Secondary | ICD-10-CM

## 2018-12-12 DIAGNOSIS — I10 Essential (primary) hypertension: Secondary | ICD-10-CM | POA: Diagnosis not present

## 2018-12-12 DIAGNOSIS — Z6836 Body mass index (BMI) 36.0-36.9, adult: Secondary | ICD-10-CM

## 2018-12-12 DIAGNOSIS — Z8249 Family history of ischemic heart disease and other diseases of the circulatory system: Secondary | ICD-10-CM | POA: Diagnosis not present

## 2018-12-12 NOTE — Patient Instructions (Signed)
Medication Instructions:  NO CHANGES   Lab Work: NOT NEEDED  Testing/Procedures: CT coronary calcium score. This test is done at [address]. This is $150 out of pocket.   Coronary CalciumScan A coronary calcium scan is an imaging test used to look for deposits of calcium and other fatty materials (plaques) in the inner lining of the blood vessels of the heart (coronary arteries). These deposits of calcium and plaques can partly clog and narrow the coronary arteries without producing any symptoms or warning signs. This puts a person at risk for a heart attack. This test can detect these deposits before symptoms develop. Tell a health care provider about:  Any allergies you have.  All medicines you are taking, including vitamins, herbs, eye drops, creams, and over-the-counter medicines.  Any problems you or family members have had with anesthetic medicines.  Any blood disorders you have.  Any surgeries you have had.  Any medical conditions you have.  Whether you are pregnant or may be pregnant. What are the risks? Generally, this is a safe procedure. However, problems may occur, including:  Harm to a pregnant woman and her unborn baby. This test involves the use of radiation. Radiation exposure can be dangerous to a pregnant woman and her unborn baby. If you are pregnant, you generally should not have this procedure done.  Slight increase in the risk of cancer. This is because of the radiation involved in the test. What happens before the procedure? No preparation is needed for this procedure. What happens during the procedure?  You will undress and remove any jewelry around your neck or chest.  You will put on a hospital gown.  Sticky electrodes will be placed on your chest. The electrodes will be connected to an electrocardiogram (ECG) machine to record a tracing of the electrical activity of your heart.  A CT scanner will take pictures of your heart.  During this time, you will be asked to lie still and hold your breath for 2-3 seconds while a picture of your heart is being taken. The procedure may vary among health care providers and hospitals. What happens after the procedure?  You can get dressed.  You can return to your normal activities.  It is up to you to get the results of your test. Ask your health care provider, or the department that is doing the test, when your results will be ready. Summary  A coronary calcium scan is an imaging test used to look for deposits of calcium and other fatty materials (plaques) in the inner lining of the blood vessels of the heart (coronary arteries).  Generally, this is a safe procedure. Tell your health care provider if you are pregnant or may be pregnant.  No preparation is needed for this procedure.  A CT scanner will take pictures of your heart.  You can return to your normal activities after the scan is done. This information is not intended to replace advice given to you by your health care provider. Make sure you discuss any questions you have with your health care provider. Document Released: 07/14/2007 Document Revised: 12/05/2015 Document Reviewed: 12/05/2015 Elsevier Interactive Patient Education  2017 [place]: At [facility], you and your health needs are our priority.  As part of our continuing mission to provide you with exceptional heart care, we have created designated Provider Care Teams.  These Care Teams include your primary Cardiologist (physician) and Advanced Practice Providers (APPs -  Physician  Assistants and Nurse Practitioners) who all work together to provide you with the care you need, when you need it.  Your next appointment:    2 months   The format for your next appointment:   In Person  Provider:   Glenetta Hew, MD  Other Instructions

## 2018-12-12 NOTE — Progress Notes (Signed)
Primary Care Provider: Martinique, Betty G, MD Cardiologist: No primary care provider on file. Electrophysiologist:   Clinic Note: Chief Complaint  Patient presents with   New Patient (Initial Visit)    Pt Requested Cardiology C/s    HPI:    Veronica [name] is a 68 y.o. female who is being seen today for the evaluation of HTN & Family H/o HEART DISEASE at the request of Martinique, Malka So, MD.  Veronica [name] was apparently referred earlier this year, but due to COVID-19 was delayed until now.  Not currently having symptoms.  Recent Hospitalizations: None  Reviewed  CV studies:    The following studies were reviewed today: (if available, images/films reviewed: From Epic Chart or Care Everywhere)  Cardiac CT Angio - 2012: Coronary calcium score 12.  Mild nonobstructive disease.  Ostial LAD and first D1/RI.  Right dominant.  Focal scarring and bronchiectasis of medial right lower lobe.  Nuclear stress test October 2012: Nonischemic   Interval History:   Veronica [name] requested cardiology referral because of her family history of cardiac disease (both parents had heart attacks presumably in the 50s, she had 2 brothers with heart valve surgery).  She is concerned because of her blood pressure recording at her PCPs office.  The first thing she mentions to me when I walk in the room was that her legs hurt her when she is walking, but hurt at the beginning of walking and get better after walking.  It does not stop her from walking.  She also has leg pain at rest.--Notably she has a history of low back pain with radiculopathy.  She has had a couple readings of blood pressures in the 150s, but currently has normal blood pressure.  She says that her blood pressure goes up when she has headaches and gets dizzy when she stands up quickly.  She also notes that her blood pressure goes up at night sometimes.  I could not elicit from her any true symptoms of having chest pain or pressure or dyspnea  with rest or exertion.  She says that she tries to do exercise, walking and other activities.  When she does this she denies any chest pain or pressure.  Some of the times her legs hurt and sometimes adult.  CV Review of Symptoms (Summary)  positive for - irregular heartbeat and Leg discomfort and fleeting episodes of discomfort in her chest with or without exertion as well as rare skipped beats. negative for - chest pain, dyspnea on exertion, edema, orthopnea, palpitations, paroxysmal nocturnal dyspnea, rapid heart rate, shortness of breath or Syncope/near syncope, TIA/amaurosis fugax, claudication.  The patient does not have symptoms concerning for COVID-19 infection (fever, chills, cough, or new shortness of breath).  The patient is practicing social distancing. ++ Masking.  ++ Groceries/shopping.   REVIEWED OF SYSTEMS   A comprehensive ROS was performed. Review of Systems  Constitutional: Negative for malaise/fatigue and weight loss.  HENT: Negative for congestion and nosebleeds.   Respiratory: Negative for shortness of breath.   Gastrointestinal: Negative for abdominal pain, blood in stool, heartburn and melena.  Genitourinary: Negative for hematuria.  Neurological: Positive for dizziness (Associate with headaches) and headaches. Negative for focal weakness and weakness.  Psychiatric/Behavioral: Negative for depression. The patient is nervous/anxious.   All other systems reviewed and are negative.  I have reviewed and (if needed) personally updated the patient's problem list, medications, allergies, past medical and surgical history, social and family history.   PAST  MEDICAL HISTORY   Past Medical History:  Diagnosis Date   Allergic rhinitis    Anxiety and depression    Constipation, chronic    Coronary artery calcification seen on CAT scan 06/2010   Mild nonobstructive CAD on cardiac CT    Hemorrhoids    Hip pain, bilateral    Hyperlipidemia    Hypertension     Leukoplakia of vagina    Low back pain    w/ left side radiculopathy, s/p injection at L4-L5 forarmen 1/06   Uterine fibroid      PAST SURGICAL HISTORY   Past Surgical History:  Procedure Laterality Date   ABDOMINAL HYSTERECTOMY  11-2000   no oophorectomy per pt   CHOLECYSTECTOMY  12/08/2010   Procedure: LAPAROSCOPIC CHOLECYSTECTOMY WITH INTRAOPERATIVE CHOLANGIOGRAM;  Surgeon: Pedro Earls, MD;  Location: [facility];  Service: General;  Laterality: N/A;  c-arm    COLONOSCOPY WITH PROPOFOL N/A 02/02/2014   Procedure: COLONOSCOPY WITH PROPOFOL;  Surgeon: Garlan Fair, MD;  Location: [facility];  Service: Endoscopy;  Laterality: N/A;   LEEP     unknown date    TUBAL LIGATION     bilateral 1989     MEDICATIONS/ALLERGIES   Current Meds  Medication Sig   ALPRAZolam (XANAX) 0.5 MG tablet Take 1 tablet (0.5 mg total) by mouth 2 (two) times daily as needed for anxiety.   amLODipine (NORVASC) 5 MG tablet TAKE 1 TABLET(5 MG) BY MOUTH EVERY MORNING   DULoxetine (CYMBALTA) 30 MG capsule Take 1 capsule (30 mg total) by mouth daily.   gabapentin (NEURONTIN) 300 MG capsule Take 1 capsule (300 mg total) by mouth at bedtime.   losartan (COZAAR) 25 MG tablet Take 1 tablet (25 mg total) by mouth daily.   pravastatin (PRAVACHOL) 40 MG tablet Take 1 tablet (40 mg total) by mouth daily.   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    Family History family history includes AAA (abdominal aortic aneurysm) (age of onset: 20) in her  father; CVA in her brother; Heart attack (age of onset: 61) in her mother; Heart disease in her brother; Hyperlipidemia in her father and mother; Hypertension in her father and mother; Schizophrenia in her brother, son, and another family member; Valvular heart disease in her brother.   OBJCTIVE -PE, EKG, labs   Wt Readings from Last 3 Encounters:  12/12/18 217 lb (98.4 kg)  11/05/18 218 lb 4 oz (99 kg)  10/07/18 217 lb 3.2 oz (98.5 kg)    Physical Exam: BP 127/79    Pulse 70    Ht 5\' 5"  (1.651 m)    Wt 217 lb (98.4 kg)    SpO2 97%    BMI 36.11 kg/m  Physical Exam  Constitutional: She is oriented to person, place, and time. She appears well-developed and well-nourished. No distress.  Healthy-appearing woman.  Well-groomed.  No acute distress  HENT:  Head: Normocephalic and atraumatic.  Eyes: Pupils are  equal, round, and reactive to light. Conjunctivae are normal.  Neck: Normal range of motion. Neck supple. No hepatojugular reflux and no JVD present. Carotid bruit is not present.  Cardiovascular: Normal rate, regular rhythm, normal heart sounds and normal pulses.  No extrasystoles are present. PMI is not displaced. Exam reveals no gallop and no friction rub.  No murmur heard. Pulmonary/Chest: Effort normal and breath sounds normal. No respiratory distress. She has no wheezes. She has no rales.  Abdominal: Soft. Bowel sounds are normal. She exhibits no distension. There is no abdominal tenderness.  No HSM  Musculoskeletal: Normal range of motion.  Neurological: She is alert and oriented to person, place, and time. No cranial nerve deficit.  Skin: Skin is dry.  Psychiatric: She has a normal mood and affect. Her behavior is normal. Judgment and thought content normal.  Vitals reviewed.   Adult ECG Report Rate: 64 ;  Rhythm: normal sinus rhythm and normal axis, intervals & durations. Low Voltage;   Narrative Interpretation: borderline normal   Recent Labs:  Lab Results  Component  Value Date   CHOL 170 10/07/2018   HDL 46.00 10/07/2018   LDLCALC 91 10/07/2018   LDLDIRECT 161.6 06/03/2012   TRIG 166.0 (H) 10/07/2018   CHOLHDL 4 10/07/2018   Lab Results  Component Value Date   CREATININE 0.62 04/01/2018   BUN 8 04/01/2018   NA 138 04/01/2018   K 3.7 04/01/2018   CL 106 04/01/2018   CO2 24 04/01/2018    ASSESSMENT/PLAN   Emmely does not seem to have any significant cardiac symptoms.  Her blood pressure is pretty well controlled on amlodipine and losartan.  She is on statin and his lipids look relatively well controlled with LDL of roughly 90.  She does have family history of CAD but it was in her parents at an older age in their 51s.  Her brothers both had valvular disease and she has no abnormal findings on cardiac exam to suspect valvular disease.  She was concerned about higher blood pressures, but situational hypertension is not abnormal. Her leg pain does not sound like claudication sounds more like deconditioning.  No swelling.  No limiting leg pain.  At this point with risk factors of hyperlipidemia obesity and hypertension as well as family history, we will do a baseline screening evaluation.  She had a coronary calcium score of roughly 12-13 back in October 2012 with a negative Myoview.  In the absence of symptoms, I see no reason to do a stress test or coronary CT angiogram.  For step would be to do a coronary calcium score to determine if there is been any progression of disease.  Based on this result, would potentially recommend titrating further statin dose.  Also a significant coronary calcium score may warrant further testing.  Problem List Items Addressed This Visit    Class 2 obesity with body mass index (BMI) of 36.0 to 36.9 in adult (Chronic)   Relevant Orders   EKG 12-Lead   CORONARY CALCIUM SCORING---CT CARDIAC SCORING   Hyperlipidemia (Chronic)   Essential hypertension (Chronic)   Relevant Orders   CORONARY CALCIUM SCORING---CT CARDIAC  SCORING   Family history of heart disease - Primary (Chronic)   Relevant Orders   EKG 12-Lead   CORONARY CALCIUM SCORING---CT CARDIAC SCORING      COVID-19 Education: The signs and symptoms of COVID-19 were discussed with the patient and how to seek care for testing (follow up with PCP or arrange E-visit).  The importance of social distancing was discussed today.  I spent a total of 20 minutes with the patient and chart review. >  50% of the time was spent in direct patient consultation.  Additional time spent with chart review (studies, outside notes, etc): 10 Total Time: 30 min   Current medicines are reviewed at length with the patient today.  (+/- concerns) none   Patient Instructions / Medication Changes & Studies & Tests Ordered   Patient Instructions  Medication Instructions:  NO CHANGES   Lab Work: NOT NEEDED  Testing/Procedures: CT coronary calcium score. This test is done at [address]. This is $150 out of pocket.  Follow-Up: At [facility], you and your health needs are our priority.  As part of our continuing mission to provide you with exceptional heart care, we have created designated Provider Care Teams.  These Care Teams include your primary Cardiologist (physician) and Advanced Practice Providers (APPs -  Physician Assistants and Nurse Practitioners) who all work together to provide you with the care you need, when you need it.  Your next appointment:    2 months   The format for your next appointment:   In Person  Provider:   Glenetta Hew, MD  Other Instructions     Studies Ordered:   Orders Placed This Encounter  Procedures   CORONARY CALCIUM SCORING---CT CARDIAC SCORING   EKG 12-Lead     Glenetta Hew, M.D., M.S. Interventional Cardiologist   Pager # [phone] Phone # [phone] [address] [place], [place] [address]   Thank you for choosing Heartcare at [facility]!!

## 2018-12-14 ENCOUNTER — Encounter: Payer: Self-pay | Admitting: Cardiology

## 2019-01-02 ENCOUNTER — Other Ambulatory Visit: Payer: Self-pay

## 2019-01-02 ENCOUNTER — Ambulatory Visit (INDEPENDENT_AMBULATORY_CARE_PROVIDER_SITE_OTHER)
Admission: RE | Admit: 2019-01-02 | Discharge: 2019-01-02 | Disposition: A | Discharge status: Home / Self Care | Payer: Medicare Other | Source: Ambulatory Visit | Attending: Cardiology | Admitting: Cardiology

## 2019-01-02 DIAGNOSIS — Z6836 Body mass index (BMI) 36.0-36.9, adult: Secondary | ICD-10-CM

## 2019-01-02 DIAGNOSIS — Z8249 Family history of ischemic heart disease and other diseases of the circulatory system: Secondary | ICD-10-CM

## 2019-01-02 DIAGNOSIS — I10 Essential (primary) hypertension: Secondary | ICD-10-CM

## 2019-01-09 ENCOUNTER — Encounter: Payer: Self-pay | Admitting: Family Medicine

## 2019-01-09 ENCOUNTER — Telehealth (INDEPENDENT_AMBULATORY_CARE_PROVIDER_SITE_OTHER): Payer: Medicare Other | Admitting: Family Medicine

## 2019-01-09 VITALS — BP 138/70 | Ht 65.0 in

## 2019-01-09 DIAGNOSIS — E785 Hyperlipidemia, unspecified: Secondary | ICD-10-CM | POA: Diagnosis not present

## 2019-01-09 DIAGNOSIS — F411 Generalized anxiety disorder: Secondary | ICD-10-CM | POA: Diagnosis not present

## 2019-01-09 DIAGNOSIS — I700 Atherosclerosis of aorta: Secondary | ICD-10-CM

## 2019-01-09 DIAGNOSIS — M5416 Radiculopathy, lumbar region: Secondary | ICD-10-CM

## 2019-01-09 DIAGNOSIS — I10 Essential (primary) hypertension: Secondary | ICD-10-CM

## 2019-01-09 MED ORDER — SERTRALINE HCL 25 MG PO TABS: ORAL_TABLET | ORAL | 0 refills | Status: DC

## 2019-01-09 MED ORDER — DULOXETINE HCL 60 MG PO CPEP: 60.0000 mg | ORAL_CAPSULE | Freq: Every day | ORAL | 1 refills | Status: DC

## 2019-01-09 MED ORDER — PRAVASTATIN SODIUM 40 MG PO TABS: 40.0000 mg | ORAL_TABLET | Freq: Every day | ORAL | 2 refills | Status: DC

## 2019-01-09 NOTE — Progress Notes (Signed)
Virtual Visit via Video Note   I connected with Veronica [name] on 01/09/19 by a video enabled telemedicine application and verified that I am speaking with the correct person using two identifiers.  Location patient: home Location provider:work office Persons participating in the virtual visit: patient, provider  I discussed the limitations of evaluation and management by telemedicine and the availability of in person appointments. The patient expressed understanding and agreed to proceed.   HPI: Veronica [name] is a 68 yo female with Hx of HTN,anxiety,HLD,and chronic back pain following on last visit. Since her last visit she has seen cardiologist and had coronary calcium scoring, she would like to go through results. Concerned about aortic atherosclerosis, she does not understand meaning.  Coronary calcifications on the LAD and LCx.  Coronary calcium score of 34. This was 68th percentile for age and sex matched control. She is on Aspirin 81 mg daily. No CP,SOB,palpitations,or diaphoresis. HTN on Amlodipine 5 mg and Losartan 25 mg daily. Home BP readings 130's/70-80's.  Last seen on 11/05/18. Last visit Cymbalta 30 mg was started. She completed PT for lower back pain. Reporting great improvement of back pain. Pain is occasionally radiated to left thigh. She is also on Gabapentin 300 mg at bedtime.  She has tolerated medication well,no side effects reported.  Anxiety: Sertraline was decreased from 50 mg to 25 mg daily last visit. Stable anxiety symptoms. No depression.  HLD:She is not taking Pravastatin. Following low fat diet.  Lab Results  Component Value Date   CHOL 170 10/07/2018   HDL 46.00 10/07/2018   LDLCALC 91 10/07/2018   LDLDIRECT 161.6 06/03/2012   TRIG 166.0 (H) 10/07/2018   CHOLHDL 4 10/07/2018    ROS: See pertinent positives and negatives per HPI.  Past Medical History:  Diagnosis Date  . Allergic rhinitis   . Anxiety and depression   . Constipation, chronic    . Coronary artery calcification seen on CAT scan 06/2010   Mild nonobstructive CAD on cardiac CT   . Hemorrhoids   . Hip pain, bilateral   . Hyperlipidemia   . Hypertension   . Leukoplakia of vagina   . Low back pain    w/ left side radiculopathy, s/p injection at L4-L5 forarmen 1/06  . Uterine fibroid     Past Surgical History:  Procedure Laterality Date  . ABDOMINAL HYSTERECTOMY  11-2000   no oophorectomy per pt  . CHOLECYSTECTOMY  12/08/2010   Procedure: LAPAROSCOPIC CHOLECYSTECTOMY WITH INTRAOPERATIVE CHOLANGIOGRAM;  Surgeon: Pedro Earls, MD;  Location: [facility];  Service: General;  Laterality: N/A;  c-arm   . COLONOSCOPY WITH PROPOFOL N/A 02/02/2014   Procedure: COLONOSCOPY WITH PROPOFOL;  Surgeon: Garlan Fair, MD;  Location: [facility];  Service: Endoscopy;  Laterality: N/A;  . LEEP     unknown date   . TUBAL LIGATION     bilateral 1989    Family History  Problem Relation Age of Onset  . Schizophrenia Son   . Schizophrenia Brother   . CVA Brother   . Valvular heart disease Brother        Valve Surgery  . Schizophrenia Other        2 nieces  . Hypertension Mother   . Hyperlipidemia Mother   . Heart attack Mother 54  . Hypertension Father   . Hyperlipidemia Father   . AAA (abdominal aortic aneurysm) Father 28  . Heart disease Brother        Vavle surgery  . Diabetes Neg Hx   .  Colon cancer Neg Hx   . Breast cancer Neg Hx     Social History   Socioeconomic History  . Marital status: Divorced    Spouse name: Not on file  . Number of children: 3  . Years of education: Not on file  . Highest education level: Not on file  Occupational History  . 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 Smoking status: Never Smoker  . Smokeless tobacco: Never Used  Substance and Sexual Activity  . Alcohol use: Yes    Comment: rarely  . Drug use: No  . Sexual activity: Never    Birth  control/protection: Surgical  Other Topics Concern  . 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   Social Determinants of Health   Financial Resource Strain:   . Difficulty of Paying Living Expenses: Not on file  Food Insecurity:   . Worried About Charity fundraiser in the Last Year: Not on file  . Ran Out of Food in the Last Year: Not on file  Transportation Needs:   . Lack of Transportation (Medical): Not on file  . Lack of Transportation (Non-Medical): Not on file  Physical Activity:   . Days of Exercise per Week: Not on file  . Minutes of Exercise per Session: Not on file  Stress:   . Feeling of Stress : Not on file  Social Connections:   . Frequency of Communication with Friends and Family: Not on file  . Frequency of Social Gatherings with Friends and Family: Not on file  . Attends Religious Services: Not on file  . Active Member of Clubs or Organizations: Not on file  . Attends Archivist Meetings: Not on file  . Marital Status: Not on file  Intimate Partner Violence:   . Fear of Current or Ex-Partner: Not on file  . Emotionally Abused: Not on file  . Physically Abused: Not on file  . Sexually Abused: Not on file    Current Outpatient Medications:  .  ALPRAZolam (XANAX) 0.5 MG tablet, Take 1 tablet (0.5 mg total) by mouth 2 (two) times daily as needed for anxiety., Disp: 10 tablet, Rfl: 0 .  amLODipine (NORVASC) 5 MG tablet, TAKE 1 TABLET(5 MG) BY MOUTH EVERY MORNING, Disp: 90 tablet, Rfl: 1 .  gabapentin (NEURONTIN) 300 MG capsule, Take 1 capsule (300 mg total) by mouth at bedtime., Disp: 30 capsule, Rfl: 3 .  losartan (COZAAR) 25 MG tablet, Take 1 tablet (25 mg total) by mouth daily., Disp: 30 tablet, Rfl: 3 .  pravastatin (PRAVACHOL) 40 MG tablet, Take 1 tablet (40 mg total) by mouth daily., Disp: 90 tablet, Rfl:  2 .  DULoxetine (CYMBALTA) 60 MG capsule, Take 1 capsule (60 mg total) by mouth daily., Disp: 90 capsule, Rfl: 1 .  fexofenadine (ALLEGRA ALLERGY) 180 MG tablet, Take 1 tablet (180 mg total) by mouth daily. (Patient not taking: Reported on 12/12/2018), Disp: 90 tablet, Rfl: 3 .  fluticasone (FLONASE) 50 MCG/ACT nasal spray, Place 1 spray into both nostrils 2 (two) times daily. (Patient not taking: Reported on 12/12/2018), Disp: 16 g, Rfl: 3 .  sertraline (ZOLOFT) 25 MG tablet, Continue weaning off:1/2 tab daily for 10 days, then q 2 days for 10 day,and every 3  days for 10 days then d/c., Disp: 30 tablet, Rfl: 0  EXAM:  VITALS per patient if applicable:BP [id number]   Ht 5\' 5"  (1.651 m)   BMI 36.11 kg/m   GENERAL: alert, oriented, appears well and in no acute distress  HEENT: atraumatic, conjunctiva clear, no obvious abnormalities on inspection.  NECK: normal movements of the head and neck  LUNGS: on inspection no signs of respiratory distress, breathing rate appears normal, no obvious gross SOB, gasping or wheezing  CV: no obvious cyanosis  Veronica: moves all visible extremities without noticeable abnormality  PSYCH/NEURO: pleasant and cooperative, no obvious depression or anxiety, speech and thought processing grossly intact  ASSESSMENT AND PLAN:  Discussed the following assessment and plan:  Lumbar back pain with radiculopathy affecting left lower extremity Improved. Cymbalta increased from 30 mg to 60 mg daily. Continue Gabapentin 300 mg qh.  Generalized anxiety disorder Stable. Continue weaning off Sertraline. Cymbalta increased to 60 mg daily.  Hyperlipidemia, unspecified hyperlipidemia type - Plan: pravastatin (PRAVACHOL) 40 MG tablet Resume Pravastatin 40 mg daily. Continue low fat diet. Benefits of statin discussed.  Essential hypertension BP adequately controlled. No changes in current management. Continue low salt diet.  Aortic atherosclerosis (House) Dx'ed  discussed. Continue Aspirin 81 mg daily and resume statin medication. Adequate BP control. Has f/u appt with cardio in 01/2019.   I discussed the assessment and treatment plan with the patient. She was provided an opportunity to ask questions and all were answered. She agreed with the plan and demonstrated an understanding of the instructions.    Return in about 4 months (around 05/10/2019) for HTN,HLD,back pain.    Veronica Vuncannon Martinique, MD

## 2019-01-10 DIAGNOSIS — I700 Atherosclerosis of aorta: Secondary | ICD-10-CM | POA: Insufficient documentation

## 2019-01-12 ENCOUNTER — Telehealth: Payer: Medicare Other | Admitting: Family Medicine

## 2019-01-12 NOTE — Progress Notes (Signed)
Patient is scheduled for 05/11/2019 at 2 PM

## 2019-02-12 ENCOUNTER — Ambulatory Visit: Payer: Medicare PPO | Admitting: Cardiology

## 2019-02-12 ENCOUNTER — Other Ambulatory Visit: Payer: Self-pay

## 2019-02-12 ENCOUNTER — Encounter: Payer: Self-pay | Admitting: Cardiology

## 2019-02-12 VITALS — BP 146/76 | HR 71 | Ht 65.0 in | Wt 225.0 lb

## 2019-02-12 DIAGNOSIS — Z8249 Family history of ischemic heart disease and other diseases of the circulatory system: Secondary | ICD-10-CM

## 2019-02-12 DIAGNOSIS — I10 Essential (primary) hypertension: Secondary | ICD-10-CM

## 2019-02-12 DIAGNOSIS — E782 Mixed hyperlipidemia: Secondary | ICD-10-CM

## 2019-02-12 DIAGNOSIS — I700 Atherosclerosis of aorta: Secondary | ICD-10-CM | POA: Diagnosis not present

## 2019-02-12 NOTE — Patient Instructions (Signed)
Medication Instructions:  No changes *If you need a refill on your cardiac medications before your next appointment, please call your pharmacy*  Lab Work: Not needed  Testing/Procedures: Not needed  Follow-Up: At [facility], you and your health needs are our priority.  As part of our continuing mission to provide you with exceptional heart care, we have created designated Provider Care Teams.  These Care Teams include your primary Cardiologist (physician) and Advanced Practice Providers (APPs -  Physician Assistants and Nurse Practitioners) who all work together to provide you with the care you need, when you need it.  Your next appointment:   12 month(s) Jan 2022  The format for your next appointment:   In Person  Provider:   Glenetta Hew, MD  Other Instructions

## 2019-02-12 NOTE — Progress Notes (Signed)
Primary Care Provider: Martinique, Betty G, MD Cardiologist: No primary care provider on file. Electrophysiologist:   Clinic Note: Chief Complaint  Patient presents with  . Follow-up    Test results    HPI:    Veronica [name] is a 69 y.o. female with a Significant Family History of CAD who presents today for 68-month follow-up  Veronica [name] was seen for initial consultation at the request of Betty Martinique, MD on December 14, 2018.  She has had a cardiac CT angiogram back in 2012 with a very low coronary calcium score and minimal disease as well as an negative stress test done at that time.   Recent Hospitalizations: none  Reviewed  CV studies:    The following studies were reviewed today: (if available, images/films reviewed: From Epic Chart or Care Everywhere) . Coronary calcium score (January 02, 2019) Cor Calcium Score 34.  LOW Risk.  Mild aortic atherosclerosis noted.   Interval History:   Veronica [name] returns here today to discuss the results of her study.  She is doing very well overall from cardiac standpoint.  She seems to be tolerating pravastatin fairly well.  She tells me that her blood pressures at home are much more within the normal range and they are today.  The last couple days have been 128/70 mmHg and 130/76 mmHg.  She denies any active cardiac symptoms of chest pain or pressure or dyspnea with rest or exertion.  May be if she increases her exertion (for instance carrying something upstairs) she will get short of breath, but not with routine activity or walking.    CV Review of Symptoms (Summary) Cardiovascular ROS: no chest pain or dyspnea on exertion negative for - edema, irregular heartbeat, orthopnea, palpitations, paroxysmal nocturnal dyspnea, rapid heart rate, shortness of breath or Syncope/near syncope, TIA/amaurosis fugax.  Claudication  The patient DOES NOT have symptoms concerning for COVID-19 infection (fever, chills, cough, or new shortness of breath).   The patient is practicing social distancing & Masking.  Tries to minimize going shopping, but when she does she goes during "off-hours "   REVIEWED OF SYSTEMS   A ROS was performed. Review of Systems  Constitutional: Negative for malaise/fatigue and weight loss.  HENT: Negative for congestion and nosebleeds.   Respiratory: Negative for shortness of breath.   Gastrointestinal: Negative for blood in stool and melena.  Genitourinary: Negative for hematuria.  Musculoskeletal: Negative for joint pain.  Neurological: Negative for dizziness, focal weakness and weakness.  Psychiatric/Behavioral: The patient is nervous/anxious (Off and on, but relatively controlled.).    I have reviewed and (if needed) personally updated the patient's problem list, medications, allergies, past medical and surgical history, social and family history.   PAST MEDICAL HISTORY   Past Medical History:  Diagnosis Date  . Allergic rhinitis   . Anxiety and depression   . Constipation, chronic   . Coronary artery calcification seen on CAT scan 06/2010   Mild nonobstructive CAD on cardiac CT  -as of 2021, coronary calcium score 34  . Hemorrhoids   . Hip pain, bilateral   . Hyperlipidemia   . Hypertension   . Leukoplakia of vagina   . Low back pain    w/ left side radiculopathy, s/p injection at L4-L5 forarmen 1/06  . Uterine fibroid     PAST SURGICAL HISTORY   Past Surgical History:  Procedure Laterality Date  . ABDOMINAL HYSTERECTOMY  11-2000   no oophorectomy per pt  . CHOLECYSTECTOMY  12/08/2010   Procedure: LAPAROSCOPIC CHOLECYSTECTOMY WITH INTRAOPERATIVE CHOLANGIOGRAM;  Surgeon: Pedro Earls, MD;  Location: [facility];  Service: General;  Laterality: N/A;  c-arm   . COLONOSCOPY WITH PROPOFOL N/A 02/02/2014   Procedure: COLONOSCOPY WITH PROPOFOL;  Surgeon: Garlan Fair, MD;  Location: [facility];  Service: Endoscopy;  Laterality: N/A;  . CORONARY CALCIUM SCORE  12/2018   Coronary calcium score-34   . CORONARY CT ANGIOGRAM  06/2010    Coronary calcium score 12.  Mild nonobstructive disease.  Ostial LAD and first D1/RI.  Right dominant.  Focal scarring and bronchiectasis of medial right lower lobe.  Marland Kitchen LEEP     unknown date   . NM MYOVIEW LTD  10/2010   Nonischemic.  Normal EF.  . TUBAL LIGATION     bilateral 1989    Cardiac CT Angio - 2012: Coronary calcium score 12.  Mild nonobstructive disease.  Ostial LAD and first D1/RI.  Right dominant.  Focal scarring and bronchiectasis of medial right lower lobe.  Nuclear stress test October 2012: Nonischemic  MEDICATIONS/ALLERGIES   Current Meds  Medication Sig  . ALPRAZolam (XANAX) 0.5 MG tablet Take 1 tablet (0.5 mg total) by mouth 2 (two) times daily as needed for anxiety.  Marland Kitchen amLODipine (NORVASC) 5 MG tablet TAKE 1 TABLET(5 MG) BY MOUTH EVERY MORNING  . DULoxetine (CYMBALTA) 60 MG capsule Take 1 capsule (60 mg total) by mouth daily.  . pravastatin (PRAVACHOL) 40 MG tablet Take 1 tablet (40 mg total) by mouth daily.  Now off of Zoloft   No Known Allergies   SOCIAL HISTORY/FAMILY HISTORY   Social History   Tobacco Use  . Smoking status: Never Smoker  . Smokeless tobacco: Never Used  Substance Use Topics  . Alcohol use: Yes    Comment: rarely  . 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    Family History family history includes AAA (abdominal aortic aneurysm) (age of onset: 43) in her father; CVA in her brother; Heart attack (age of onset: 48) in her mother; Heart disease in her brother; Hyperlipidemia in her father and mother; Hypertension in her father and mother; Schizophrenia in her brother, son, and another family member; Valvular heart disease in her brother.   OBJCTIVE -PE, EKG, labs   Wt Readings from Last 3 Encounters:  02/12/19 225 lb (102.1 kg)    12/12/18 217 lb (98.4 kg)  11/05/18 218 lb 4 oz (99 kg)    Physical Exam: BP (!) 146/76   Pulse 71   Ht 5\' 5"  (1.651 m)   Wt 225 lb (102.1 kg)   SpO2 96%   BMI 37.44 kg/m  Physical Exam  Constitutional: She is oriented to person, place, and time. She appears well-developed and well-nourished. No distress.  Healthy-appearing.  Moderate to severely obese  HENT:  Head: Normocephalic and atraumatic.  Neck: No hepatojugular reflux and no JVD present. Carotid bruit is not present.  Cardiovascular: Normal rate, regular rhythm, normal heart sounds and normal pulses.  No extrasystoles are present. PMI is not displaced. Exam reveals no gallop and no friction rub.  No murmur heard. Pulmonary/Chest: Effort normal and breath sounds normal. No respiratory distress. She has no wheezes. She has no rales.  Musculoskeletal:        General: No  edema. Normal range of motion.     Cervical back: Normal range of motion and neck supple.  Neurological: She is alert and oriented to person, place, and time.  Psychiatric: She has a normal mood and affect. Her behavior is normal. Judgment and thought content normal.  Vitals reviewed.   Adult ECG Report n/a  Recent Labs:  Due for labs soon with PCP.   Lab Results  Component Value Date   CHOL 170 10/07/2018   HDL 46.00 10/07/2018   LDLCALC 91 10/07/2018   LDLDIRECT 161.6 06/03/2012   TRIG 166.0 (H) 10/07/2018   CHOLHDL 4 10/07/2018   Lab Results  Component Value Date   CREATININE 0.62 04/01/2018   BUN 8 04/01/2018   NA 138 04/01/2018   K 3.7 04/01/2018   CL 106 04/01/2018   CO2 24 04/01/2018    ASSESSMENT/PLAN    Problem List Items Addressed This Visit    Hyperlipidemia (Chronic)    I think she still needs to stay on pravastatin which she sees with tolerating well with her current dose of 40 mg.  Lipids look pretty stable for her extent of cardiac risk.      Essential hypertension (Chronic)    Her blood pressure has been pretty well  controlled on amlodipine.  Low but high today, but she tells me at home it was 120/70 yesterday and then 130/76 this morning.  I suspect that she is a little bit anxious here today.  Would continue to monitor with PCP.      Family history of heart disease - Primary (Chronic)    Very reassuring follow-up coronary calcium score of 34 which is only minimally elevated from 12 that was seen back in 2012.  She is not having any active anginal symptoms.  For now would simply just continue to monitor and treat risk factors.      Aortic atherosclerosis (HCC) (Chronic)    Noted on coronary CT angiogram as well as coronary calcium score.  She clearly has atherosclerotic disease we should therefore work on respect modification.  LDL is currently 91 on current dose of statin.  Blood pressure seems to be better controlled at home than here.  Would continue to monitor and treat accordingly.         COVID-19 Education: The signs and symptoms of COVID-19 were discussed with the patient and how to seek care for testing (follow up with PCP or arrange E-visit).   The importance of social distancing was discussed today.  I spent a total of 10 minutes with the patient and chart review. >  50% of the time was spent in direct patient consultation.  Additional time spent with chart review (studies, outside notes, etc): 6 Total Time: 16 min   Current medicines are reviewed at length with the patient today.  (+/- concerns) n/a   Patient Instructions / Medication Changes & Studies & Tests Ordered   Patient Instructions  Medication Instructions:  No changes *If you need a refill on your cardiac medications before your next appointment, please call your pharmacy*  Lab Work: Not needed  Testing/Procedures: Not needed  Follow-Up: At [facility], you and your health needs are our priority.  As part of our continuing mission to provide you with exceptional heart care, we have created designated Provider  Care Teams.  These Care Teams include your primary Cardiologist (physician) and Advanced Practice Providers (APPs -  Physician Assistants and Nurse Practitioners) who all work together to provide you with the  care you need, when you need it.  Your next appointment:   12 month(s) Jan 2022  The format for your next appointment:   In Person  Provider:   Glenetta Hew, MD  Other Instructions     Studies Ordered:   No orders of the defined types were placed in this encounter.    Glenetta Hew, M.D., M.S. Interventional Cardiologist   Pager # [phone] Phone # [phone] [address]. [place], [place] [address]   Thank you for choosing Heartcare at [facility]!!

## 2019-02-15 ENCOUNTER — Encounter: Payer: Self-pay | Admitting: Cardiology

## 2019-02-15 NOTE — Assessment & Plan Note (Signed)
Very reassuring follow-up coronary calcium score of 34 which is only minimally elevated from 12 that was seen back in 2012.  She is not having any active anginal symptoms.  For now would simply just continue to monitor and treat risk factors.

## 2019-02-15 NOTE — Assessment & Plan Note (Signed)
I think she still needs to stay on pravastatin which she sees with tolerating well with her current dose of 40 mg.  Lipids look pretty stable for her extent of cardiac risk.

## 2019-02-15 NOTE — Assessment & Plan Note (Signed)
Her blood pressure has been pretty well controlled on amlodipine.  Low but high today, but she tells me at home it was 120/70 yesterday and then 130/76 this morning.  I suspect that she is a little bit anxious here today.  Would continue to monitor with PCP.

## 2019-02-15 NOTE — Assessment & Plan Note (Signed)
Noted on coronary CT angiogram as well as coronary calcium score.  She clearly has atherosclerotic disease we should therefore work on respect modification.  LDL is currently 91 on current dose of statin.  Blood pressure seems to be better controlled at home than here.  Would continue to monitor and treat accordingly.

## 2019-02-16 ENCOUNTER — Encounter: Payer: Self-pay | Admitting: Family Medicine

## 2019-02-17 ENCOUNTER — Ambulatory Visit: Payer: Medicare PPO | Admitting: Family Medicine

## 2019-02-17 ENCOUNTER — Encounter: Payer: Self-pay | Admitting: Family Medicine

## 2019-02-17 ENCOUNTER — Other Ambulatory Visit: Payer: Self-pay

## 2019-02-17 VITALS — BP 124/70 | HR 82 | Resp 16 | Ht 65.0 in | Wt 224.0 lb

## 2019-02-17 DIAGNOSIS — R0789 Other chest pain: Secondary | ICD-10-CM | POA: Diagnosis not present

## 2019-02-17 DIAGNOSIS — I10 Essential (primary) hypertension: Secondary | ICD-10-CM | POA: Diagnosis not present

## 2019-02-17 DIAGNOSIS — H8113 Benign paroxysmal vertigo, bilateral: Secondary | ICD-10-CM

## 2019-02-17 DIAGNOSIS — Z6836 Body mass index (BMI) 36.0-36.9, adult: Secondary | ICD-10-CM | POA: Diagnosis not present

## 2019-02-17 MED ORDER — MECLIZINE HCL 25 MG PO TABS: 25.0000 mg | ORAL_TABLET | Freq: Two times a day (BID) | ORAL | 0 refills | Status: DC | PRN

## 2019-02-17 MED ORDER — PHENTERMINE HCL 37.5 MG PO TABS: 37.5000 mg | ORAL_TABLET | Freq: Every day | ORAL | 1 refills | Status: DC

## 2019-02-17 NOTE — Patient Instructions (Addendum)
A few things to remember from today's visit:   Benign paroxysmal positional vertigo due to bilateral vestibular disorder - Plan: meclizine (ANTIVERT) 25 MG tablet  Sensation of chest pressure  Class 2 severe obesity due to excess calories with serious comorbidity and body mass index (BMI) of 36.0 to 36.9 in adult [facility]), Chronic Wt Readings from Last 3 Encounters:  02/17/19 224 lb (101.6 kg)  02/12/19 225 lb (102.1 kg)  12/12/18 217 lb (98.4 kg)     Dizziness is a perception of movement, it is sometimes difficult to describe and can be  caused by different problems, most benign but others can be life threaten.  Vertigo is the most common cause of dizziness, usually related with inner ear and can be associated with nausea, vomiting, and unbalance sensation. It can be complicated by falls due to lose of balance; so fall precautions are very important.  Most of the time dizziness is benign, usually intermittent, last a few seconds at the time and aggravated by certain positions. It usually resolves in a few weeks without residual effect but it could be recurrent.  Sometimes blood work is ordered to evaluate for other possible causes, we do not need any today.  Dizziness can also be caused by certain medications, dehydration, migraines, and strokes.  Medication prescribed for vertigo, Meclizine, causes drowsiness/sleepiness, so frequently I recommended taking it at bedtime. I also recommend what we called vestibular exercise, sometimes can be done at home (Modified Semont maneuvers) other times I refer patients to vestibular rehabilitation.   Seek immediate medical attention if: New severe headache, dobble vision, fever (100 F or more), associated numbness/tingling, focal weakness, persistent vomiting, not able to walk, or sudden worsening symptoms.  Please be sure medication list is accurate. If a new problem present, please set up appointment sooner than planned today.

## 2019-02-17 NOTE — Progress Notes (Signed)
ACUTE VISIT   HPI:  Chief Complaint  Patient presents with  . Dizziness    [name] is a 69 y.o. female, who is here today complaining of 2 days of spinning-like sensation that happens when she turns in bed, head movement, and bending over. Problem is alleviated by being still and closing her eyes for a few seconds.  Episode last a few seconds. Associated nausea. She remembers having similar problem years ago.  History of intermittent bilateral tinnitus, stable. No changes in hearing.  She has not taking OTC medications.  She tried home remedy, green tea with ginger, lemon, apple cider vinegar, and honey It seems to be better today.  Sometimes she feels mid, no radiated chest pressure sensation that last a few more seconds after spinning sensation is resolved. She has seen cardiologist recently due to chest pain.  According to patient, cardiac work-up was negative.  She does not have chest pain with exertion.  Negative for recent URI, new medication, or travel.  Hypertension: Currently she is on amlodipine 5 mg daily.  Negative for unusual headache, fever, chills, more fatigue than usual, visual changes, hearing loss, sore throat, dyspnea, palpitations, diaphoresis, or focal weakness.   She has gained some wt since her last visit.  She is now driving to work, so decreased physical activity. She does not think she is eating more than usual.  Review of Systems  Constitutional: Negative for activity change, appetite change and unexpected weight change.  HENT: Negative for congestion, mouth sores, nosebleeds and trouble swallowing.   Respiratory: Negative for cough and wheezing.   Cardiovascular: Negative for leg swelling.  Gastrointestinal: Negative for abdominal pain and vomiting.       Negative for changes in bowel habits.  Genitourinary: Negative for decreased urine volume and hematuria.  Neurological: Negative for syncope, facial asymmetry and  numbness.  Rest see pertinent positives and negatives per HPI.  Current Outpatient Medications on File Prior to Visit  Medication Sig Dispense Refill  . ALPRAZolam (XANAX) 0.5 MG tablet Take 1 tablet (0.5 mg total) by mouth 2 (two) times daily as needed for anxiety. 10 tablet 0  . amLODipine (NORVASC) 5 MG tablet TAKE 1 TABLET(5 MG) BY MOUTH EVERY MORNING 90 tablet 1  . DULoxetine (CYMBALTA) 60 MG capsule Take 1 capsule (60 mg total) by mouth daily. 90 capsule 1  . pravastatin (PRAVACHOL) 40 MG tablet Take 1 tablet (40 mg total) by mouth daily. 90 tablet 2   No current facility-administered medications on file prior to visit.    Past Medical History:  Diagnosis Date  . Allergic rhinitis   . Anxiety and depression   . Constipation, chronic   . Coronary artery calcification seen on CAT scan 06/2010   Mild nonobstructive CAD on cardiac CT  -as of 2021, coronary calcium score 34  . Hemorrhoids   . Hip pain, bilateral   . Hyperlipidemia   . Hypertension   . Leukoplakia of vagina   . Low back pain    w/ left side radiculopathy, s/p injection at L4-L5 forarmen 1/06  . Uterine fibroid    No Known Allergies  Social History   Socioeconomic History  . Marital status: Divorced    Spouse name: Not on file  . Number of children: 3  . Years of education: Not on file  . Highest education level: Not on file  Occupational History  . 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 Smoking status: Never Smoker  . Smokeless tobacco: Never Used  Substance and Sexual Activity  . Alcohol use: Yes    Comment: rarely  . Drug use: No  . Sexual activity: Never    Birth control/protection: Surgical  Other Topics Concern  . 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   Social Determinants of  Health   Financial Resource Strain:   . Difficulty of Paying Living Expenses: Not on file  Food Insecurity:   . Worried About Charity fundraiser in the Last Year: Not on file  . Ran Out of Food in the Last Year: Not on file  Transportation Needs:   . Lack of Transportation (Medical): Not on file  . Lack of Transportation (Non-Medical): Not on file  Physical Activity:   . Days of Exercise per Week: Not on file  . Minutes of Exercise per Session: Not on file  Stress:   . Feeling of Stress : Not on file  Social Connections:   . Frequency of Communication with Friends and Family: Not on file  . Frequency of Social Gatherings with Friends and Family: Not on file  . Attends Religious Services: Not on file  . Active Member of Clubs or Organizations: Not on file  . Attends Archivist Meetings: Not on file  . Marital Status: Not on file    Vitals:   02/17/19 1507  BP: 124/70  Pulse: 82  Resp: 16  SpO2: 99%   Wt Readings from Last 3 Encounters:  02/17/19 224 lb (101.6 kg)  02/12/19 225 lb (102.1 kg)  12/12/18 217 lb (98.4 kg)   Body mass index is 37.28 kg/m.  Physical Exam  Nursing note and vitals reviewed. Constitutional: She is oriented to person, place, and time. She appears well-developed. She does not appear ill. No distress.  HENT:  Head: Atraumatic.  Right Ear: Hearing, tympanic membrane, external ear and ear canal normal.  Left Ear: Hearing, tympanic membrane, external ear and ear canal normal.  Mouth/Throat: Oropharynx is clear and moist and mucous membranes are normal.  Apley maneuver positive, bilateral, R>L. Nystagmus present.  Eyes: Pupils are equal, round, and reactive to light. Conjunctivae are normal.  Neck: Carotid bruit is not present.  Cardiovascular: Normal rate and regular rhythm.  No murmur heard. Respiratory: Effort normal and breath sounds normal. No respiratory distress.  GI: Soft. She exhibits no mass. There is no hepatomegaly. There is  no abdominal tenderness.  Musculoskeletal:        General: No edema.  Lymphadenopathy:    She has no cervical adenopathy.  Neurological: She is alert and oriented to person, place, and time. No cranial nerve deficit. She displays a negative Romberg sign. Coordination and gait normal.  Reflex Scores:      Patellar reflexes are 2+ on the right side and 2+ on the left side. Skin: Skin is warm. No rash noted. No cyanosis or erythema.  Psychiatric: Her mood appears anxious.  Well groomed, good eye contact.   ASSESSMENT AND PLAN:  Ms. Veronica [name] was seen today for dizziness.  Diagnoses and all orders for this visit:  Benign paroxysmal positional vertigo due to bilateral vestibular disorder We discussed other possible etiologies of dizziness, Hx and examination today suggest benign vertigo. I do not  think further work-up is necessary at this time but needs to be consider if worsening or persient symptoms for more that 2 weeks; in this case ENT will be considered. Explained that problem can be recurrent. Fall prevention. Vestibular exercises recommended, handout with Semont maneuvers given. Meclizine 25 mg tid prn, some side effects discussed. Instructed about warning signs. F/U as needed.  -     meclizine (ANTIVERT) 25 MG tablet; Take 1 tablet (25 mg total) by mouth 2 (two) times daily as needed for dizziness.  Sensation of chest pressure Possible causes discussed. Coronary calcium score of 34.  68th percentile for age and sex matched controls. Instructed about warning signs. Continue following with cardiologist.  Class 2 severe obesity due to excess calories with serious comorbidity and body mass index (BMI) of 36.0 to 36.9 in adult [facility]) Since 10/2018 she gained about 6 pounds. We discussed benefits of wt loss as well as adverse effects of obesity. Consistency with healthy diet and physical activity recommended. The discussion of pharmacologic options, she would like to try  phentermine. We discussed some side effects including elevated BP, tachycardia, and constipation among some.  Recommend starting 1/2 tablet before breakfast for 10 days and increase to the whole tablet if well-tolerated. Follow-up in 4 weeks.  -     phentermine (ADIPEX-P) 37.5 MG tablet; Take 1 tablet (37.5 mg total) by mouth daily before breakfast. Start 1/2 tab for 10 days,then increase to whole tab.  Essential hypertension Problem is adequately controlled. Recommend monitoring BP regularly, phentermine side effect discussed. No changes in current management.  Return in about 4 weeks (around 03/17/2019), or if symptoms worsen or fail to improve, for wt,vertigo.    Kilani Joffe G. Martinique, MD  [facility]. [place] office.

## 2019-04-10 ENCOUNTER — Ambulatory Visit: Payer: Medicare PPO | Attending: Internal Medicine

## 2019-04-10 DIAGNOSIS — Z23 Encounter for immunization: Secondary | ICD-10-CM

## 2019-04-10 NOTE — Progress Notes (Signed)
   Covid-19 Vaccination Clinic  Name:  Veronica [name]    MRN: [id number] DOB: 28-Jun-1950  04/10/2019  [name] was observed post Covid-19 immunization for 15 minutes without incident. She was provided with Vaccine Information Sheet and instruction to access the V-Safe system.   [name] was instructed to call 911 with any severe reactions post vaccine: Marland Kitchen Difficulty breathing  . Swelling of face and throat  . A fast heartbeat  . A bad rash all over body  . Dizziness and weakness   Immunizations Administered    Name Date Dose VIS Date Route   Pfizer COVID-19 Vaccine 04/10/2019  1:24 PM 0.3 mL 01/09/2019 Intramuscular   Manufacturer: [place]   Lot: [id number]   [place]: [id number]

## 2019-04-14 ENCOUNTER — Other Ambulatory Visit: Payer: Self-pay | Admitting: Family Medicine

## 2019-05-05 ENCOUNTER — Ambulatory Visit: Payer: Medicare PPO | Attending: Internal Medicine

## 2019-05-05 DIAGNOSIS — Z23 Encounter for immunization: Secondary | ICD-10-CM

## 2019-05-05 NOTE — Progress Notes (Signed)
   Covid-19 Vaccination Clinic  Name:  Veronica [name]    MRN: [id number] DOB: 07-22-1950  05/05/2019  [name] was observed post Covid-19 immunization for 15 minutes without incident. She was provided with Vaccine Information Sheet and instruction to access the V-Safe system.   [name] was instructed to call 911 with any severe reactions post vaccine: Marland Kitchen Difficulty breathing  . Swelling of face and throat  . A fast heartbeat  . A bad rash all over body  . Dizziness and weakness   Immunizations Administered    Name Date Dose VIS Date Route   Pfizer COVID-19 Vaccine 05/05/2019  1:11 PM 0.3 mL 01/09/2019 Intramuscular   Manufacturer: [place]   Lot: [id number]   [place]: [id number]

## 2019-05-06 ENCOUNTER — Encounter: Payer: Medicare PPO | Admitting: Family Medicine

## 2019-05-11 ENCOUNTER — Telehealth: Payer: Medicare PPO | Admitting: Family Medicine

## 2019-08-19 ENCOUNTER — Encounter: Payer: Self-pay | Admitting: Family Medicine

## 2019-08-19 ENCOUNTER — Other Ambulatory Visit: Payer: Self-pay

## 2019-08-24 MED ORDER — ALPRAZOLAM 0.5 MG PO TABS: 0.2500 mg | ORAL_TABLET | Freq: Every day | ORAL | 0 refills | Status: DC | PRN

## 2019-08-24 NOTE — Telephone Encounter (Signed)
Pt is calling in to check the status of her refill of alprazolam Duanne Moron) pt is aware that we ask for 24-72 hrs for refills.  Pt is aware that it is on the providers desktop for approval.

## 2019-08-28 ENCOUNTER — Ambulatory Visit (INDEPENDENT_AMBULATORY_CARE_PROVIDER_SITE_OTHER): Payer: Medicare PPO | Admitting: Family Medicine

## 2019-08-28 ENCOUNTER — Encounter: Payer: Self-pay | Admitting: Family Medicine

## 2019-08-28 ENCOUNTER — Other Ambulatory Visit: Payer: Self-pay

## 2019-08-28 VITALS — BP 128/80 | HR 82 | Temp 98.0°F | Resp 16 | Ht 65.0 in | Wt 216.0 lb

## 2019-08-28 DIAGNOSIS — E785 Hyperlipidemia, unspecified: Secondary | ICD-10-CM

## 2019-08-28 DIAGNOSIS — Z23 Encounter for immunization: Secondary | ICD-10-CM

## 2019-08-28 DIAGNOSIS — Z6836 Body mass index (BMI) 36.0-36.9, adult: Secondary | ICD-10-CM | POA: Diagnosis not present

## 2019-08-28 DIAGNOSIS — I10 Essential (primary) hypertension: Secondary | ICD-10-CM

## 2019-08-28 DIAGNOSIS — K645 Perianal venous thrombosis: Secondary | ICD-10-CM | POA: Diagnosis not present

## 2019-08-28 DIAGNOSIS — F411 Generalized anxiety disorder: Secondary | ICD-10-CM | POA: Diagnosis not present

## 2019-08-28 DIAGNOSIS — Z0000 Encounter for general adult medical examination without abnormal findings: Secondary | ICD-10-CM

## 2019-08-28 LAB — LIPID PANEL
Cholesterol: 209 mg/dL — ABNORMAL HIGH (ref <200)
HDL: 55 mg/dL (ref >=50)
LDL Cholesterol (Calc): 132 mg/dL (calc) — ABNORMAL HIGH
Non-HDL Cholesterol (Calc): 154 mg/dL (calc) — ABNORMAL HIGH (ref <130)
Total CHOL/HDL Ratio: 3.8 (calc) (ref <5.0)
Triglycerides: 111 mg/dL (ref <150)

## 2019-08-28 LAB — COMPREHENSIVE METABOLIC PANEL
AG Ratio: 1.5 (calc) (ref 1.0–2.5)
ALT: 14 U/L (ref 6–29)
AST: 18 U/L (ref 10–35)
Albumin: 4.4 g/dL (ref 3.6–5.1)
Alkaline phosphatase (APISO): 77 U/L (ref 37–153)
BUN: 10 mg/dL (ref 7–25)
CO2: 25 mmol/L (ref 20–32)
Calcium: 9.3 mg/dL (ref 8.6–10.4)
Chloride: 102 mmol/L (ref 98–110)
Creat: 0.78 mg/dL (ref 0.50–0.99)
Globulin: 3 g/dL (calc) (ref 1.9–3.7)
Glucose, Bld: 89 mg/dL (ref 65–99)
Potassium: 4.2 mmol/L (ref 3.5–5.3)
Sodium: 136 mmol/L (ref 135–146)
Total Bilirubin: 0.7 mg/dL (ref 0.2–1.2)
Total Protein: 7.4 g/dL (ref 6.1–8.1)

## 2019-08-28 MED ORDER — HYDROCORTISONE (PERIANAL) 2.5 % EX CREA: 1.0000 "application " | TOPICAL_CREAM | Freq: Two times a day (BID) | CUTANEOUS | 1 refills | Status: DC | PRN

## 2019-08-28 NOTE — Patient Instructions (Addendum)
A few things to remember from today's visit:   Routine general medical examination at a health care facility  Essential hypertension - Plan: Comprehensive metabolic panel  Dyslipidemia - Plan: Lipid panel, Comprehensive metabolic panel  External hemorrhoid, thrombosed - Plan: hydrocortisone (ANUSOL-HC) 2.5 % rectal cream  Class 2 severe obesity due to excess calories with serious comorbidity and body mass index (BMI) of 36.0 to 36.9 in adult [facility])  Generalized anxiety disorder  If you need refills please call your pharmacy. Do not use My Chart to request refills or for acute issues that need immediate attention.    Please be sure medication list is accurate. If a new problem present, please set up appointment sooner than planned today.  Please arrange mammogram. Pneumovax vaccine today. No cambios en sus medicamentos.   A few tips:  -As we age balance is not as good as it was, so there is a higher risks for falls. Please remove small rugs and furniture that is "in your way" and could increase the risk of falls. Stretching exercises may help with fall prevention: Yoga and Tai Chi are some examples. Low impact exercise is better, so you are not very achy the next day.  -Sun screen and avoidance of direct sun light recommended. Caution with dehydration, if working outdoors be sure to drink enough fluids.  - Some medications are not safe as we age, increases the risk of side effects and can potentially interact with other medication you are also taken;  including some of over the counter medications. Be sure to let me know when you start a new medication even if it is a dietary/vitamin supplement.   -Healthy diet low in red meet/animal fat and sugar + regular physical activity is recommended.     Hemorroides Hemorrhoids Las hemorroides son venas inflamadas adentro o alrededor del recto o del ano. Hay dos tipos de hemorroides:  Hemorroides internas. Se forman en las venas del  interior del recto. Pueden abultarse hacia afuera, irritarse y doler.  Hemorroides externas. Se producen en las venas externas del ano y pueden sentirse como un bulto o zona hinchada, dura y dolorosa cerca del ano. [place] hemorroides no causan problemas graves y se Engineer, petroleum con tratamientos caseros [employer] cambios en la dieta y el estilo de vida. Si los tratamientos caseros no ayudan con los sntomas, se pueden Optometrist procedimientos para reducir o extirpar las hemorroides. Cules son las causas? La causa de esta afeccin es el aumento de la presin en la zona anal. Esta presin puede ser causada por distintos factores, por ejemplo:  Estreimiento.  Hacer un gran esfuerzo para defecar.  Diarrea.  Embarazo.  Obesidad.  Estar sentado durante largos perodos de [place].  Levantar objetos pesados u otras actividades que impliquen esfuerzo.  Sexo anal.  Andar en bicicleta por un largo perodo de tiempo. Cules son los signos o los sntomas? Los sntomas de esta afeccin incluyen los siguientes:  Social research officer, government.  Picazn o irritacin anal.  Sangrado rectal.  Prdida de materia fecal (heces).  Inflamacin anal.  Uno o ms bultos alrededor del ano. Cmo se diagnostica? Esta afeccin se diagnostica frecuentemente a travs de un examen visual. Posiblemente le realicen otros tipos de pruebas o estudios, como los siguientes:  Un examen que implica palpar el rea rectal con la mano enguantada (examen rectal digital).  Un examen del canal anal que se realiza utilizando un pequeo tubo (anoscopio).  Anlisis de sangre si ha perdido una cantidad significativa de  sangre.  Una prueba que consiste en la observacin del interior del colon utilizando un tubo flexible con una cmara en el extremo (sigmoidoscopia o colonoscopa). Cmo se trata? Esta afeccin generalmente se puede tratar en el hogar. Sin embargo, se pueden TEFL teacher procedimientos si los cambios en la  dieta, en el estilo de vida y otros tratamientos caseros no [employer] sntomas. Estos procedimientos pueden ayudar a reducir o [place] hemorroides completamente. Algunos de estos procedimientos son quirrgicos y otros no. Algunos de los procedimientos ms frecuentes son los siguientes:  Ligadura con Forensic psychologist. Las bandas elsticas se colocan en la base de las hemorroides para interrumpir su irrigacin de [place].  Escleroterapia. Se inyecta un medicamento en las hemorroides para reducir su tamao.  Coagulacin con luz infrarroja. Se utiliza un tipo de energa lumnica para eliminar las hemorroides.  Hemorroidectoma. Las hemorroides se extirpan con [place] y las venas que las [place] se IT consultant.  Hemorroidopexia con grapas. El cirujano engrapa la base de las hemorroides a la pared del recto. Siga estas indicaciones en su casa: Comida y bebida   Consuma alimentos con alto contenido de [place], como cereales integrales, porotos, frutos secos, frutas y verduras.  Pregntele a su mdico acerca de tomar productos con fibra aadida en ellos (complementos de fibra).  Disminuya la cantidad de grasa de la dieta. Esto se puede lograr consumiendo productos lcteos con bajo contenido de grasas, ingiriendo menor cantidad de carnes rojas y evitando los alimentos procesados.  Beba suficiente lquido como para Theatre manager la orina de color amarillo plido. Control del dolor y la hinchazn   Tome baos de asiento tibios durante 20 minutos, 3 o 4 veces por da para Glass blower/designer y las [place]. Puede hacer esto en una baera o usar un dispositivo porttil para bao de asiento que se coloca sobre el inodoro.  Si se lo indican, aplique hielo en la zona afectada. Usar compresas de [employer] baos de asiento puede ser [place]. ? Ponga el hielo en una bolsa plstica. ? Coloque una [employer] piel y Therapist, nutritional. ? Coloque el hielo durante 80mnutos, 2 a 3veces por da. Indicaciones  generales  TDelphide venta libre y los recetados solamente como se lo haya indicado el mdico.  Aplquese los medicamentos, cremas o supositorios como se lo hayan indicado.  Haga ejercicio con regularidad. Consulte al mdico qu cantidad y qu tipo de ejercicio es mejor para usted. En general, debe realizar al menos 354mutos de ejercicio moderado la maHartford Financiale la semana (150 minutos cada semana). Esto puede incluir acTarget Corporationandar en bicicleta o practicar yoga.  Vaya al bao cuando sienta la necesidad de defecar. No espere.  Evite hacer fuerza en las deposiciones.  Mantenga la zona anal limpia y seca. Use papel higinico hmedo o toallitas humedecidas despus de las deposiciones.  No pase mucho tiempo sentado en el inodoro. Esto aumenta la afluencia de sangre y elConservation officer, historic buildings Concurra a todas las visitas de seguimiento como se lo haya indicado el mdico. Esto es importante. Comunquese con un mdico si tiene:  Aumento del dolor y la hinchazn que no puede controlar con medicamentos o trClinical research associate No puede defecar o lo hace con dificultad.  Dolor o tiene inflamacin fuera de la zona de las hemorroides. Solicite ayuda inmediatamente si tiene:  Hemorragia descontrolada en el recto. Resumen  Las hemorroides son venas inflamadas adentro o alrededor del recto o del ano.  LaBenito Mccreedy  de las hemorroides se pueden controlar con tratamientos caseros como cambios en la dieta y el estilo de vida.  Tomar baos de asiento con agua tibia puede ayudar a Best boy y las [place].  En los casos graves, se pueden realizar procedimientos o Ardelia Mems ciruga para reducir o [place] hemorroides. Esta informacin no tiene Marine scientist el consejo del mdico. Asegrese de hacerle al mdico cualquier pregunta que tenga. Document Revised: 07/25/2017 Document Reviewed: 07/25/2017 Elsevier Patient Education  [place].

## 2019-08-28 NOTE — Progress Notes (Signed)
HPI: Veronica [name] is a 69 y.o. female, who is here today for her routine physical.  Last CPE: 01/06/18.  Regular exercise 3 or more time per week: Not consistently, started swimming "sometimes." She is frustrate because obesity. She took Phentermine in 01/2019 but did not follow as recommended.  Following a healthy diet: She is trying to do better. She lives with her son now.  Chronic medical problems: HTN,anxiety,HLD,chronic back pain, and OA among some. Chronic back pain, Cymbalta has helped. Pain is sometimes radiated to LE's.  Negative for saddle anesthesia and bowel/bladder dysfunction.  Immunization History  Administered Date(s) Administered  . Hepatitis A, Adult 10/26/2015  . Influenza Whole 10/07/2009  . MMR 10/26/2015  . PFIZER SARS-COV-2 Vaccination 04/10/2019, 05/05/2019  . Pneumococcal Conjugate-13 01/06/2018  . Pneumococcal Polysaccharide-23 08/28/2019  . Tdap 10/24/2014  . Zoster 09/01/2012   Mammogram: 03/12/17. Colonoscopy: 02/02/14. DEXA: 03/06/18 normal. Hep C screening : 12/26/16 NR.  She has some concerns today.  HTN: She is on Amlodipine 5 mg daily. Component     Latest Ref Rng & Units 04/01/2018  Sodium     135 - 146 mmol/L 138  Potassium     3.5 - 5.3 mmol/L 3.7  Chloride     98 - 110 mmol/L 106  CO2     20 - 32 mmol/L 24  Glucose     65 - 99 mg/dL 107 (H)  BUN     7 - 25 mg/dL 8  Creatinine     0.50 - 0.99 mg/dL 0.62  Calcium     8.6 - 10.4 mg/dL 9.1  GFR, Est Non African American     >60 mL/min >60  GFR, Est African American     >60 mL/min >60  Anion gap     5 - 15 8   Anxiety: Getting worse. She is on Cymbalta 60 mg daily, which has helped. Her son is having severe anxiety, now living with her, this is worsening her anxiety. She takes Alprazolam 0.5 mg at bedtime is needed.  HLD: She is on Pravastatin 40 mg daily. Tolerating medication well.  Component     Latest Ref Rng & Units 10/07/2018  Cholesterol     <200 mg/dL  170  Triglycerides     <150 mg/dL 166.0 (H)  HDL Cholesterol     > OR = 50 mg/dL 46.00  VLDL     0.0 - 40.0 mg/dL 33.2  LDL (calc)     0 - 99 mg/dL 91  Total CHOL/HDL Ratio     <5.0 (calc) 4  NonHDL      124.44   "Hemorrhoids": Constipation, sometimes she needs to strain.She feels like perianal area is irritated, no blood after defecation. + Dyschezia.  She takes OTC fiber supplement. Has daily bowel movements. No associated abdominal pain.  Review of Systems  Constitutional: Negative for appetite change, fatigue and fever.  HENT: Negative for dental problem, hearing loss, mouth sores and sore throat.   Eyes: Negative for redness and visual disturbance.  Respiratory: Negative for cough, shortness of breath and wheezing.   Cardiovascular: Negative for chest pain and leg swelling.  Gastrointestinal: Negative for abdominal pain, nausea and vomiting.       No changes in bowel habits.  Endocrine: Negative for cold intolerance, heat intolerance, polydipsia, polyphagia and polyuria.  Genitourinary: Negative for decreased urine volume, dysuria, hematuria, vaginal bleeding and vaginal discharge.  Musculoskeletal: Negative for arthralgias, gait problem and myalgias.  Skin: Negative  for color change and rash.  Allergic/Immunologic: Positive for environmental allergies.  Neurological: Negative for syncope, weakness and headaches.  Hematological: Negative for adenopathy. Does not bruise/bleed easily.  Psychiatric/Behavioral: Positive for sleep disturbance. Negative for confusion. The patient is nervous/anxious.   All other systems reviewed and are negative.  Current Outpatient Medications on File Prior to Visit  Medication Sig Dispense Refill  . ALPRAZolam (XANAX) 0.5 MG tablet Take 0.5-1 tablets (0.25-0.5 mg total) by mouth daily as needed for anxiety. 15 tablet 0  . amLODipine (NORVASC) 5 MG tablet TAKE 1 TABLET(5 MG) BY MOUTH EVERY MORNING 90 tablet 1  . DULoxetine (CYMBALTA) 60 MG  capsule Take 1 capsule (60 mg total) by mouth daily. 90 capsule 1  . meclizine (ANTIVERT) 25 MG tablet Take 1 tablet (25 mg total) by mouth 2 (two) times daily as needed for dizziness. 30 tablet 0  . pravastatin (PRAVACHOL) 40 MG tablet Take 1 tablet (40 mg total) by mouth daily. 90 tablet 2   No current facility-administered medications on file prior to visit.   Past Medical History:  Diagnosis Date  . Allergic rhinitis   . Anxiety and depression   . Constipation, chronic   . Coronary artery calcification seen on CAT scan 06/2010   Mild nonobstructive CAD on cardiac CT  -as of 2021, coronary calcium score 34  . Hemorrhoids   . Hip pain, bilateral   . Hyperlipidemia   . Hypertension   . Leukoplakia of vagina   . Low back pain    w/ left side radiculopathy, s/p injection at L4-L5 forarmen 1/06  . Uterine fibroid     Past Surgical History:  Procedure Laterality Date  . ABDOMINAL HYSTERECTOMY  11-2000   no oophorectomy per pt  . CHOLECYSTECTOMY  12/08/2010   Procedure: LAPAROSCOPIC CHOLECYSTECTOMY WITH INTRAOPERATIVE CHOLANGIOGRAM;  Surgeon: [name], MD;  Location: [facility];  Service: General;  Laterality: N/A;  c-arm   . COLONOSCOPY WITH PROPOFOL N/A 02/02/2014   Procedure: COLONOSCOPY WITH PROPOFOL;  Surgeon: Charolett Bumpers, MD;  Location: [facility];  Service: Endoscopy;  Laterality: N/A;  . CORONARY CALCIUM SCORE  12/2018   Coronary calcium score-34  . CORONARY CT ANGIOGRAM  06/2010    Coronary calcium score 12.  Mild nonobstructive disease.  Ostial LAD and first D1/RI.  Right dominant.  Focal scarring and bronchiectasis of medial right lower lobe.  Marland Kitchen LEEP     unknown date   . NM MYOVIEW LTD  10/2010   Nonischemic.  Normal EF.  . TUBAL LIGATION     bilateral 1989    No Known Allergies  Family History  Problem Relation Age of Onset  . Schizophrenia Son   . Schizophrenia Brother   . CVA Brother   . Valvular heart disease Brother        Valve Surgery  .  Schizophrenia Other        2 nieces  . Hypertension Mother   . Hyperlipidemia Mother   . Heart attack Mother 35  . Hypertension Father   . Hyperlipidemia Father   . AAA (abdominal aortic aneurysm) Father 34  . Heart disease Brother        Vavle surgery  . Diabetes Neg Hx   . Colon cancer Neg Hx   . Breast cancer Neg Hx     Social History   Socioeconomic History  . Marital status: Divorced    Spouse name: Not on file  . Number of children: 3  .  Years of education: Not on file  . Highest education level: Not on file  Occupational History  . 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 Smoking status: Never Smoker  . Smokeless tobacco: Never Used  Substance and Sexual Activity  . Alcohol use: Yes    Comment: rarely  . Drug use: No  . Sexual activity: Never    Birth control/protection: Surgical  Other Topics Concern  . 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   Social Determinants of Health   Financial Resource Strain:   . Difficulty of Paying Living Expenses:   Food Insecurity:   . Worried About Charity fundraiser in the Last Year:   . Arboriculturist in the Last Year:   Transportation Needs:   . Film/video editor (Medical):   Marland Kitchen Lack of Transportation (Non-Medical):   Physical Activity:   . Days of Exercise per Week:   . Minutes of Exercise per Session:   Stress:   . Feeling of Stress :   Social Connections:   . Frequency of Communication with Friends and Family:   . Frequency of Social Gatherings with Friends and Family:   . Attends Religious Services:   . Active Member of Clubs or Organizations:   . Attends Archivist Meetings:   Marland Kitchen Marital Status:      Vitals:   08/28/19 0947  BP: 128/80  Pulse: 82  Resp: 16  Temp: 98 F (36.7 C)   SpO2: 94%   Body mass index is 35.94 kg/m.  Wt Readings from Last 3 Encounters:  08/28/19 (!) 216 lb (98 kg)  02/17/19 224 lb (101.6 kg)  02/12/19 225 lb (102.1 kg)   Physical Exam Vitals and nursing note reviewed.  Constitutional:      General: She is not in acute distress.    Appearance: She is well-developed.  HENT:     Head: Normocephalic and atraumatic.     Right Ear: Hearing, tympanic membrane, ear canal and external ear normal.     Left Ear: Hearing, tympanic membrane, ear canal and external ear normal.     Mouth/Throat:     Mouth: Mucous membranes are moist.     Pharynx: Oropharynx is clear. Uvula midline.  Eyes:     Conjunctiva/sclera: Conjunctivae normal.     Pupils: Pupils are equal, round, and reactive to light.  Neck:     Thyroid: No thyromegaly.     Trachea: No tracheal deviation.  Cardiovascular:     Rate and Rhythm: Normal rate and regular rhythm.     Pulses:          Dorsalis pedis pulses are 2+ on the right side and 2+ on the left side.     Heart sounds: No murmur heard.   Pulmonary:     Effort: Pulmonary effort is normal. No respiratory distress.     Breath sounds: Normal breath sounds.  Abdominal:     Palpations: Abdomen is soft. There is no hepatomegaly or mass.     Tenderness: There is no abdominal tenderness.  Genitourinary:    Comments: Breast: No masses,skin changes,or nipple discharge. Skin tag and thrombosed hemorrhoid x 1 between 3-4 O'clock.  Musculoskeletal:     Comments: No major deformity  or signs of synovitis appreciated.  Lymphadenopathy:     Cervical: No cervical adenopathy.     Upper Body:     Right upper body: No supraclavicular adenopathy.     Left upper body: No supraclavicular adenopathy.  Skin:    General: Skin is warm.     Findings: No erythema or rash.  Neurological:     General: No focal deficit present.     Mental Status: She is alert and oriented to person, place, and time.     Cranial Nerves: No cranial nerve  deficit.     Coordination: Coordination normal.     Gait: Gait normal.     Deep Tendon Reflexes:     Reflex Scores:      Bicep reflexes are 2+ on the right side and 2+ on the left side.      Patellar reflexes are 2+ on the right side and 2+ on the left side. Psychiatric:        Mood and Affect: Mood is anxious. Affect is labile.        Speech: Speech normal.     Comments: Well groomed, good eye contact.    ASSESSMENT AND PLAN:  Veronica [name] was here today annual physical examination.  Orders Placed This Encounter  Procedures  . Pneumococcal polysaccharide vaccine 23-valent greater than or equal to 2yo subcutaneous/IM  . Lipid panel  . Comprehensive metabolic panel  . Amb Ref to Medical Weight Management    Lab Results  Component Value Date   CHOL 209 (H) 08/28/2019   HDL 55 08/28/2019   LDLCALC 132 (H) 08/28/2019   LDLDIRECT 161.6 06/03/2012   TRIG 111 08/28/2019   CHOLHDL 3.8 08/28/2019   Lab Results  Component Value Date   CREATININE 0.78 08/28/2019   BUN 10 08/28/2019   NA 136 08/28/2019   K 4.2 08/28/2019   CL 102 08/28/2019   CO2 25 08/28/2019   Lab Results  Component Value Date   ALT 14 08/28/2019   AST 18 08/28/2019   ALKPHOS 66 01/06/2018   BILITOT 0.7 08/28/2019   Routine general medical examination at a health care facility We discussed the importance of regular physical activity and healthy diet for prevention of chronic illness and/or complications. Preventive guidelines reviewed. Vaccination updated. Ca++ and vit D supplementation to continue. Next CPE in a year.  The 10-year ASCVD risk score Mikey Bussing DC Brooke Bonito., et al., 2013) is: 9.4%   Values used to calculate the score:     Age: 57 years     Sex: Female     Is Non-Hispanic African American: No     Diabetic: No     Tobacco smoker: No     Systolic Blood Pressure: 631 mmHg     Is BP treated: Yes     HDL Cholesterol: 55 mg/dL     Total Cholesterol: 209 mg/dL  Essential hypertension BP  adequately controlled. No changes in current management. Low salt diet.  Dyslipidemia No changes in current management, will follow FLP done today and will give further recommendations accordingly.  External hemorrhoid, thrombosed Sitz bath with warm water a few times per week. Adequate fiber and fluid intake.  -     hydrocortisone (ANUSOL-HC) 2.5 % rectal cream; Place 1 application rectally 2 (two) times daily as needed for hemorrhoids.  Class 2 severe obesity due to excess calories with serious comorbidity and body mass index (BMI) of 36.0 to 36.9 in adult [facility]) She has lost some  wt since 01/2019. We discussed benefits of wt loss as well as adverse effects of obesity. Consistency with healthy diet and physical activity recommended.  She would like referral to wt loss clinic, referral placed.  -     Amb Ref to Medical Weight Management  Generalized anxiety disorder She is able to deal with stress. Some side effects of Alprazolam discussed.  Cymbalta has helped, so no changes.  Other orders -     Pneumococcal polysaccharide vaccine 23-valent greater than or equal to 2yo subcutaneous/IM   Return in 6 months (on 02/28/2020) for f/u.   Larz Mark G. [place], MD  [facility]. [place] office.    A few things to remember from today's visit:   Routine general medical examination at a health care facility  Essential hypertension - Plan: Comprehensive metabolic panel  Dyslipidemia - Plan: Lipid panel, Comprehensive metabolic panel  External hemorrhoid, thrombosed - Plan: hydrocortisone (ANUSOL-HC) 2.5 % rectal cream  Class 2 severe obesity due to excess calories with serious comorbidity and body mass index (BMI) of 36.0 to 36.9 in adult [facility])  Generalized anxiety disorder  If you need refills please call your pharmacy. Do not use My Chart to request refills or for acute issues that need immediate attention.    Please be sure medication list is accurate. If a  new problem present, please set up appointment sooner than planned today.  Please arrange mammogram. Pneumovax vaccine today. No cambios en sus medicamentos.   A few tips:  -As we age balance is not as good as it was, so there is a higher risks for falls. Please remove small rugs and furniture that is "in your way" and could increase the risk of falls. Stretching exercises may help with fall prevention: Yoga and Tai Chi are some examples. Low impact exercise is better, so you are not very achy the next day.  -Sun screen and avoidance of direct sun light recommended. Caution with dehydration, if working outdoors be sure to drink enough fluids.  - Some medications are not safe as we age, increases the risk of side effects and can potentially interact with other medication you are also taken;  including some of over the counter medications. Be sure to let me know when you start a new medication even if it is a dietary/vitamin supplement.   -Healthy diet low in red meet/animal fat and sugar + regular physical activity is recommended.     Hemorroides Hemorrhoids Las hemorroides son venas inflamadas adentro o alrededor del recto o del ano. Hay dos tipos de hemorroides:  Hemorroides internas. Se forman en las venas del interior del recto. Pueden abultarse hacia afuera, irritarse y doler.  Hemorroides externas. Se producen en las venas externas del ano y pueden sentirse como un bulto o zona hinchada, dura y dolorosa cerca del ano. La mayora de las hemorroides no causan problemas graves y se Sports coach con tratamientos caseros [employer] cambios en la dieta y el estilo de vida. Si los tratamientos caseros no ayudan con los sntomas, se pueden Education officer, environmental procedimientos para reducir o extirpar las hemorroides. Cules son las causas? La causa de esta afeccin es el aumento de la presin en la zona anal. Esta presin puede ser causada por distintos factores, por  ejemplo:  Estreimiento.  Hacer un gran esfuerzo para defecar.  Diarrea.  Embarazo.  Obesidad.  Estar sentado durante largos perodos de [place].  Levantar objetos pesados u otras actividades que impliquen esfuerzo.  Sexo anal.  Andar en  bicicleta por un largo perodo de [place]. Cules son los signos o los sntomas? Los sntomas de esta afeccin incluyen los siguientes:  Social research officer, government.  Picazn o irritacin anal.  Sangrado rectal.  Prdida de materia fecal (heces).  Inflamacin anal.  Uno o ms bultos alrededor del ano. Cmo se diagnostica? Esta afeccin se diagnostica frecuentemente a travs de un examen visual. Posiblemente le realicen otros tipos de pruebas o estudios, como los siguientes:  Un examen que implica palpar el rea rectal con la mano enguantada (examen rectal digital).  Un examen del canal anal que se realiza utilizando un pequeo tubo (anoscopio).  Anlisis de sangre si ha perdido [place] cantidad significativa de [place].  Una prueba que consiste en la observacin del interior del colon utilizando un tubo flexible con una cmara en el extremo (sigmoidoscopia o colonoscopa). Cmo se trata? Esta afeccin generalmente se puede tratar en el hogar. Sin embargo, se pueden TEFL teacher procedimientos si los cambios en la dieta, en el estilo de vida y otros tratamientos caseros no [employer] sntomas. Estos procedimientos pueden ayudar a reducir o [place] hemorroides completamente. Algunos de estos procedimientos son quirrgicos y otros no. Algunos de los procedimientos ms frecuentes son los siguientes:  Ligadura con Forensic psychologist. Las bandas elsticas se colocan en la base de las hemorroides para interrumpir su irrigacin de [place].  Escleroterapia. Se inyecta un medicamento en las hemorroides para reducir su tamao.  Coagulacin con luz infrarroja. Se utiliza un tipo de energa lumnica para eliminar las hemorroides.  Hemorroidectoma. Las hemorroides  se extirpan con [place] y las venas que las [place] se IT consultant.  Hemorroidopexia con grapas. El cirujano engrapa la base de las hemorroides a la pared del recto. Siga estas indicaciones en su casa: Comida y bebida   Consuma alimentos con alto contenido de [place], como cereales integrales, porotos, frutos secos, frutas y verduras.  Pregntele a su mdico acerca de tomar productos con fibra aadida en ellos (complementos de fibra).  Disminuya la cantidad de grasa de la dieta. Esto se puede lograr consumiendo productos lcteos con bajo contenido de grasas, ingiriendo menor cantidad de carnes rojas y evitando los alimentos procesados.  Beba suficiente lquido como para Theatre manager la orina de color amarillo plido. Control del dolor y la hinchazn   Tome baos de asiento tibios durante 20 minutos, 3 o 4 veces por da para Glass blower/designer y las [place]. Puede hacer esto en una baera o usar un dispositivo porttil para bao de asiento que se coloca sobre el inodoro.  Si se lo indican, aplique hielo en la zona afectada. Usar compresas de [employer] baos de asiento puede ser [place]. ? Ponga el hielo en una bolsa plstica. ? Coloque una [employer] piel y Therapist, nutritional. ? Coloque el hielo durante 72mnutos, 2 a 3veces por da. Indicaciones generales  TDelphide venta libre y los recetados solamente como se lo haya indicado el mdico.  Aplquese los medicamentos, cremas o supositorios como se lo hayan indicado.  Haga ejercicio con regularidad. Consulte al mdico qu cantidad y qu tipo de ejercicio es mejor para usted. En general, debe realizar al menos 366mutos de ejercicio moderado la maHartford Financiale la semana (150 minutos cada semana). Esto puede incluir acTarget Corporationandar en bicicleta o practicar yoga.  Vaya al bao cuando sienta la necesidad de defecar. No espere.  Evite hacer fuerza en las deposiciones.  Mantenga la zona anal limpia y seca. Use  papel  higinico hmedo o toallitas humedecidas despus de las deposiciones.  No pase mucho tiempo sentado en el inodoro. Esto aumenta la afluencia de sangre y Conservation officer, historic buildings.  Concurra a todas las visitas de seguimiento como se lo haya indicado el mdico. Esto es importante. Comunquese con un mdico si tiene:  Aumento del dolor y la hinchazn que no puede controlar con medicamentos o Clinical research associate.  No puede defecar o lo hace con dificultad.  Dolor o tiene inflamacin fuera de la zona de las hemorroides. Solicite ayuda inmediatamente si tiene:  Hemorragia descontrolada en el recto. Resumen  Las hemorroides son venas inflamadas adentro o alrededor del recto o del ano.  La mayora de las hemorroides se pueden controlar con tratamientos caseros como cambios en la dieta y el estilo de vida.  Tomar baos de asiento con agua tibia puede ayudar a Best boy y las [place].  En los casos graves, se pueden realizar procedimientos o Ardelia Mems ciruga para reducir o [place] hemorroides. Esta informacin no tiene Marine scientist el consejo del mdico. Asegrese de hacerle al mdico cualquier pregunta que tenga. Document Revised: 07/25/2017 Document Reviewed: 07/25/2017 Elsevier Patient Education  [place].

## 2019-08-31 ENCOUNTER — Telehealth: Payer: Self-pay | Admitting: Family Medicine

## 2019-08-31 NOTE — Telephone Encounter (Signed)
Pt returned called. Can be reached at [phone]

## 2019-09-01 MED ORDER — PRAVASTATIN SODIUM 80 MG PO TABS
80.0000 mg | ORAL_TABLET | Freq: Every day | ORAL | 3 refills | Status: DC
Start: 2019-09-01 — End: 2020-06-08

## 2019-09-01 NOTE — Telephone Encounter (Signed)
See result note.  

## 2019-09-01 NOTE — Telephone Encounter (Signed)
Pt returned the call to Judson Roch

## 2019-09-22 ENCOUNTER — Ambulatory Visit (INDEPENDENT_AMBULATORY_CARE_PROVIDER_SITE_OTHER): Payer: Medicare PPO | Admitting: Family Medicine

## 2019-09-22 ENCOUNTER — Encounter (INDEPENDENT_AMBULATORY_CARE_PROVIDER_SITE_OTHER): Payer: Self-pay | Admitting: Family Medicine

## 2019-09-22 ENCOUNTER — Other Ambulatory Visit: Payer: Self-pay

## 2019-09-22 VITALS — BP 133/74 | HR 64 | Temp 98.0°F | Ht 64.0 in | Wt 213.0 lb

## 2019-09-22 DIAGNOSIS — R5383 Other fatigue: Secondary | ICD-10-CM

## 2019-09-22 DIAGNOSIS — Z1331 Encounter for screening for depression: Secondary | ICD-10-CM

## 2019-09-22 DIAGNOSIS — R739 Hyperglycemia, unspecified: Secondary | ICD-10-CM | POA: Diagnosis not present

## 2019-09-22 DIAGNOSIS — Z6836 Body mass index (BMI) 36.0-36.9, adult: Secondary | ICD-10-CM | POA: Diagnosis not present

## 2019-09-22 DIAGNOSIS — Z0289 Encounter for other administrative examinations: Secondary | ICD-10-CM

## 2019-09-22 DIAGNOSIS — R0602 Shortness of breath: Secondary | ICD-10-CM | POA: Diagnosis not present

## 2019-09-22 DIAGNOSIS — E785 Hyperlipidemia, unspecified: Secondary | ICD-10-CM

## 2019-09-22 NOTE — Progress Notes (Signed)
Chief Complaint:   OBESITY Veronica [name] (MR# [id number]) is a 69 y.o. female who presents for evaluation and treatment of obesity and related comorbidities. Current BMI is Body mass index is 36.56 kg/m. Veronica [name] has been struggling with her weight for many years and has been unsuccessful in either losing weight, maintaining weight loss, or reaching her healthy weight goal.  Veronica [name] is currently in the action stage of change and ready to dedicate time achieving and maintaining a healthier weight. Veronica [name] is interested in becoming our patient and working on intensive lifestyle modifications including (but not limited to) diet and exercise for weight loss.  Veronica [name]'s habits were reviewed today and are as follows: she thinks her family will eat healthier with her, she struggles with family and or coworkers weight loss sabotage, her desired weight loss is 43 lbs, she started gaining weight when she moved to [place] in 1996, her heaviest weight ever was 240 pounds, she is a picky eater and doesn't like to eat healthier foods, she has significant food cravings issues, she snacks frequently in the evenings, she skips meals frequently, she frequently makes poor food choices, she frequently eats larger portions than normal, she has binge eating behaviors and she struggles with emotional eating.  Depression Screen Veronica [name]'s Food and Mood (modified PHQ-9) score was 3.  Depression screen Veronica [name] 2/9 09/22/2019  Decreased Interest 1  Down, Depressed, Hopeless 1  PHQ - 2 Score 2  Altered sleeping 1  Tired, decreased energy 0  Change in appetite 0  Feeling bad or failure about yourself  0  Trouble concentrating 0  Moving slowly or fidgety/restless 0  Suicidal thoughts 0  PHQ-9 Score 3  Difficult doing work/chores Not difficult at all   Subjective:   1. Other fatigue Veronica [name] admits to daytime somnolence and admits to waking up still tired. Patent has a history of symptoms of daytime fatigue and morning headache.  Veronica [name] generally gets 6 hours of sleep per night, and states that she has nightime awakenings. Snoring is present. Apneic episodes are not present. Epworth Sleepiness Score is 10.  2. SOB (shortness of breath) on exertion Veronica [name] notes increasing shortness of breath with exercising and seems to be worsening over time with weight gain. She notes getting out of breath sooner with activity than she used to. This has not gotten worse recently. Veronica [name] denies shortness of breath at rest or orthopnea.  3. Hyperlipidemia, unspecified hyperlipidemia type Veronica [name] is on pravastatin, and she would like to improve with diet and weight loss.  4. Hyperglycemia Veronica [name] has a history of some elevated fasting glucose readings, and she notes polyphagia. She has no recent A1c.  Assessment/Plan:   1. Other fatigue Veronica [name] does feel that her weight is causing her energy to be lower than it should be. Fatigue may be related to obesity, depression or many other causes. Labs will be ordered, and in the meanwhile, Veronica [name] will focus on self care including making healthy food choices, increasing physical activity and focusing on stress reduction.  - EKG 12-Lead - CBC with Differential/Platelet - Comprehensive metabolic panel - TSH - VITAMIN D 25 Hydroxy (Vit-D Deficiency, Fractures) - T3 - T4, free  2. SOB (shortness of breath) on exertion Veronica [name] does feel that she gets out of breath more easily that she used to when she exercises. Veronica [name]'s shortness of breath appears to be obesity related and exercise induced. She has agreed to work on weight loss and gradually increase exercise to treat her  exercise induced shortness of breath. Will continue to monitor closely.  3. Hyperlipidemia, unspecified hyperlipidemia type Cardiovascular risk and specific lipid/LDL goals reviewed. We discussed several lifestyle modifications today. Veronica [name] will continue to work on diet, exercise and weight loss efforts. We will check labs today. Orders and  follow up as documented in patient record.   - Lipid Panel With LDL/HDL Ratio  4. Hyperglycemia Fasting labs will be obtained today, and results with be discussed with Veronica [name] in 2 weeks at her follow up visit. In the meanwhile Veronica [name] will start her Category 2 plan and will work on weight loss efforts.  - Hemoglobin A1c - Insulin, random  5. Depression screening Veronica [name] had a negative depression screening. Depression is commonly associated with obesity and often results in emotional eating behaviors. We will monitor this closely and work on CBT to help improve the non-hunger eating patterns. Referral to Psychology may be required if no improvement is seen as she continues in our clinic.  6. Class 2 severe obesity with serious comorbidity and body mass index (BMI) of 36.0 to 36.9 in adult, unspecified obesity type Veronica [name]) Veronica [name] is currently in the action stage of change and her goal is to continue with weight loss efforts. I recommend Veronica [name] begin the structured treatment plan as follows:  She has agreed to the Category 2 Plan + 100 calories.  Exercise goals: No exercise has been prescribed for now, while we concentrate on nutritional changes.  Behavioral modification strategies: no skipping meals.  She was informed of the importance of frequent follow-up visits to maximize her success with intensive lifestyle modifications for her multiple health conditions. She was informed we would discuss her lab results at her next visit unless there is a critical issue that needs to be addressed sooner. Veronica [name] agreed to keep her next visit at the agreed upon time to discuss these results.  Objective:   Blood pressure 133/74, pulse 64, temperature 98 F (36.7 C), temperature source Oral, height 5\' 4"  (1.626 m), weight 213 lb (96.6 kg), SpO2 95 %. Body mass index is 36.56 kg/m.  EKG: Normal sinus rhythm, rate 66 BPM.  Indirect Calorimeter completed today shows a VO2 of 265 and a REE of 1848.  Her calculated  basal metabolic rate is 2683 thus her basal metabolic rate is better than expected.  General: Cooperative, alert, well developed, in no acute distress. HEENT: Conjunctivae and lids unremarkable. Cardiovascular: Regular rhythm.  Lungs: Normal work of breathing. Neurologic: No focal deficits.   Lab Results  Component Value Date   CREATININE 0.78 08/28/2019   BUN 10 08/28/2019   NA 136 08/28/2019   K 4.2 08/28/2019   CL 102 08/28/2019   CO2 25 08/28/2019   Lab Results  Component Value Date   ALT 14 08/28/2019   AST 18 08/28/2019   ALKPHOS 66 01/06/2018   BILITOT 0.7 08/28/2019   Lab Results  Component Value Date   HGBA1C 5.5 09/02/2012   HGBA1C 5.6 10/08/2010   HGBA1C 5.3 05/18/2008   No results found for: INSULIN Lab Results  Component Value Date   TSH 0.88 01/06/2018   Lab Results  Component Value Date   CHOL 209 (H) 08/28/2019   HDL 55 08/28/2019   LDLCALC 132 (H) 08/28/2019   LDLDIRECT 161.6 06/03/2012   TRIG 111 08/28/2019   CHOLHDL 3.8 08/28/2019   Lab Results  Component Value Date   WBC 5.5 06/27/2018   HGB 13.0 06/27/2018   HCT 38.5 06/27/2018   MCV 88.5  06/27/2018   PLT 228.0 06/27/2018   No results found for: IRON, TIBC, FERRITIN Obesity Behavioral Intervention Visit Documentation for Insurance:   Approximately 15 minutes were spent on the discussion below.  ASK: We discussed the diagnosis of obesity with Veronica [name] today and Veronica [name] agreed to give [place] permission to discuss obesity behavioral modification therapy today.  ASSESS: Veronica [name] has the diagnosis of obesity and her BMI today is 36.54. Veronica [name] is in the action stage of change.   ADVISE: Veronica [name] was educated on the multiple health risks of obesity as well as the benefit of weight loss to improve her health. She was advised of the need for long term treatment and the importance of lifestyle modifications to improve her current health and to decrease her risk of future health problems.  AGREE: Multiple  dietary modification options and treatment options were discussed and Veronica [name] agreed to follow the recommendations documented in the above note.  ARRANGE: Veronica [name] was educated on the importance of frequent visits to treat obesity as outlined per CMS and USPSTF guidelines and agreed to schedule her next follow up appointment today.  Attestation Statements:   Reviewed by clinician on day of visit: allergies, medications, problem list, medical history, surgical history, family history, social history, and previous encounter notes.   I, Trixie Dredge, am acting as transcriptionist for Dennard Nip, MD.  I have reviewed the above documentation for accuracy and completeness, and I agree with the above. - Dennard Nip, MD

## 2019-09-23 LAB — CBC WITH DIFFERENTIAL/PLATELET
Basophils Absolute: 0.1 10*3/uL (ref 0.0–0.2)
Basos: 1 %
EOS (ABSOLUTE): 0.2 10*3/uL (ref 0.0–0.4)
Eos: 3 %
Hematocrit: 41.7 % (ref 34.0–46.6)
Hemoglobin: 13.2 g/dL (ref 11.1–15.9)
Immature Grans (Abs): 0 10*3/uL (ref 0.0–0.1)
Immature Granulocytes: 0 %
Lymphocytes Absolute: 2.2 10*3/uL (ref 0.7–3.1)
Lymphs: 35 %
MCH: 29.3 pg (ref 26.6–33.0)
MCHC: 31.7 g/dL (ref 31.5–35.7)
MCV: 93 fL (ref 79–97)
Monocytes Absolute: 0.3 10*3/uL (ref 0.1–0.9)
Monocytes: 5 %
Neutrophils Absolute: 3.5 10*3/uL (ref 1.4–7.0)
Neutrophils: 56 %
Platelets: 254 10*3/uL (ref 150–450)
RBC: 4.5 x10E6/uL (ref 3.77–5.28)
RDW: 13.6 % (ref 11.7–15.4)
WBC: 6.2 10*3/uL (ref 3.4–10.8)

## 2019-09-23 LAB — LIPID PANEL WITH LDL/HDL RATIO
Cholesterol, Total: 182 mg/dL (ref 100–199)
HDL: 55 mg/dL (ref >39)
LDL Chol Calc (NIH): 105 mg/dL — ABNORMAL HIGH (ref 0–99)
LDL/HDL Ratio: 1.9 ratio (ref 0.0–3.2)
Triglycerides: 123 mg/dL (ref 0–149)
VLDL Cholesterol Cal: 22 mg/dL (ref 5–40)

## 2019-09-23 LAB — COMPREHENSIVE METABOLIC PANEL
ALT: 17 IU/L (ref 0–32)
AST: 19 IU/L (ref 0–40)
Albumin/Globulin Ratio: 1.6 (ref 1.2–2.2)
Albumin: 4.6 g/dL (ref 3.8–4.8)
Alkaline Phosphatase: 79 IU/L (ref 48–121)
BUN/Creatinine Ratio: 12 (ref 12–28)
BUN: 8 mg/dL (ref 8–27)
Bilirubin Total: 0.6 mg/dL (ref 0.0–1.2)
CO2: 23 mmol/L (ref 20–29)
Calcium: 9.4 mg/dL (ref 8.7–10.3)
Chloride: 102 mmol/L (ref 96–106)
Creatinine, Ser: 0.67 mg/dL (ref 0.57–1.00)
GFR calc Af Amer: 105 mL/min/{1.73_m2} (ref >59)
GFR calc non Af Amer: 91 mL/min/{1.73_m2} (ref >59)
Globulin, Total: 2.8 g/dL (ref 1.5–4.5)
Glucose: 89 mg/dL (ref 65–99)
Potassium: 4.3 mmol/L (ref 3.5–5.2)
Sodium: 138 mmol/L (ref 134–144)
Total Protein: 7.4 g/dL (ref 6.0–8.5)

## 2019-09-23 LAB — HEMOGLOBIN A1C
Est. average glucose Bld gHb Est-mCnc: 111 mg/dL
Hgb A1c MFr Bld: 5.5 % (ref 4.8–5.6)

## 2019-09-23 LAB — VITAMIN D 25 HYDROXY (VIT D DEFICIENCY, FRACTURES): Vit D, 25-Hydroxy: 28.5 ng/mL — ABNORMAL LOW (ref 30.0–100.0)

## 2019-09-23 LAB — TSH: TSH: 0.36 u[IU]/mL — ABNORMAL LOW (ref 0.450–4.500)

## 2019-09-23 LAB — INSULIN, RANDOM: INSULIN: 8.1 u[IU]/mL (ref 2.6–24.9)

## 2019-09-23 LAB — T3: T3, Total: 104 ng/dL (ref 71–180)

## 2019-09-23 LAB — T4, FREE: Free T4: 1.07 ng/dL (ref 0.82–1.77)

## 2019-10-06 ENCOUNTER — Other Ambulatory Visit: Payer: Self-pay

## 2019-10-06 ENCOUNTER — Encounter (INDEPENDENT_AMBULATORY_CARE_PROVIDER_SITE_OTHER): Payer: Self-pay | Admitting: Family Medicine

## 2019-10-06 ENCOUNTER — Ambulatory Visit (INDEPENDENT_AMBULATORY_CARE_PROVIDER_SITE_OTHER): Payer: Medicare PPO | Admitting: Family Medicine

## 2019-10-06 VITALS — BP 128/77 | HR 69 | Temp 98.0°F | Ht 64.0 in | Wt 212.0 lb

## 2019-10-06 DIAGNOSIS — E7849 Other hyperlipidemia: Secondary | ICD-10-CM | POA: Diagnosis not present

## 2019-10-06 DIAGNOSIS — Z6836 Body mass index (BMI) 36.0-36.9, adult: Secondary | ICD-10-CM | POA: Diagnosis not present

## 2019-10-06 DIAGNOSIS — Z9189 Other specified personal risk factors, not elsewhere classified: Secondary | ICD-10-CM | POA: Diagnosis not present

## 2019-10-06 DIAGNOSIS — E559 Vitamin D deficiency, unspecified: Secondary | ICD-10-CM

## 2019-10-06 MED ORDER — VITAMIN D (ERGOCALCIFEROL) 1.25 MG (50000 UNIT) PO CAPS: 50000.0000 [IU] | ORAL_CAPSULE | ORAL | 0 refills | Status: DC

## 2019-10-07 NOTE — Progress Notes (Signed)
Chief Complaint:   OBESITY Veronica [name] is here to discuss her progress with her obesity treatment plan along with follow-up of her obesity related diagnoses. Veronica [name] is on the Category 2 Plan and states she is following her eating plan approximately 50% of the time. Veronica [name] states she is walking for 30 minutes 4 times per week.  Today's visit was #: 2 Starting weight: 213 lbs Starting date: 09/22/2019 Today's weight: 212 lbs Today's date: 10/06/2019 Total lbs lost to date: 1 Total lbs lost since last in-office visit: 1  Interim History: Veronica [name] was able to lose some weight since her last visit, but she struggled to stay on track with the plan when her son visited and over Labor Day weekend. She feels she struggles with meal planning especially for dinner.  Subjective:   1. Other hyperlipidemia Deryl's LDL is not yet at goal, and she is on statin. Her LFTs are within normal limits. I discussed labs with the patient today.  2. Vitamin D deficiency Veronica [name] has a new diagnosis of Vit D deficiency. Her Vit D level is low and she notes fatigue. She is not current on on Vit D. I discussed labs with the patient today.  3. At risk for heart disease Veronica [name] is at a higher than average risk for cardiovascular disease due to obesity.   Assessment/Plan:   1. Other hyperlipidemia Cardiovascular risk and specific lipid/LDL goals reviewed. We discussed several lifestyle modifications today. Veronica [name] will continue statin, and will continue to work on diet, exercise and weight loss efforts. We will recheck labs in 3 months. Orders and follow up as documented in patient record.   2. Vitamin D deficiency Low Vitamin D level contributes to fatigue and are associated with obesity, breast, and colon cancer. Veronica [name] agreed to start prescription Vitamin D 50,000 IU every week with no refills. She will follow-up for routine testing of Vitamin D, at least 2-3 times per year to avoid over-replacement.  - Vitamin D,  Ergocalciferol, (DRISDOL) 1.25 MG (50000 UNIT) CAPS capsule; Take 1 capsule (50,000 Units total) by mouth every 7 (seven) days.  Dispense: 4 capsule; Refill: 0  3. At risk for heart disease Veronica [name] was given approximately 30 minutes of coronary artery disease prevention counseling today. She is 69 y.o. female and has risk factors for heart disease including obesity. We discussed intensive lifestyle modifications today with an emphasis on specific weight loss instructions and strategies.   Repetitive spaced learning was employed today to elicit superior memory formation and behavioral change.  4. Class 2 severe obesity with serious comorbidity and body mass index (BMI) of 36.0 to 36.9 in adult, unspecified obesity type Veronica [name]) Veronica [name] is currently in the action stage of change. As such, her goal is to continue with weight loss efforts. She has agreed to the Category 2 Plan and keeping a food journal and adhering to recommended goals of 400-550 calories and 35+ grams of protein at supper daily.   Easy higher protein recipes were given today.  Exercise goals: As is.  Behavioral modification strategies: meal planning and cooking strategies.  Veronica [name] has agreed to follow-up with our clinic in 2 weeks. She was informed of the importance of frequent follow-up visits to maximize her success with intensive lifestyle modifications for her multiple health conditions.   Objective:   Blood pressure 128/77, pulse 69, temperature 98 F (36.7 C), height 5\' 4"  (1.626 m), weight 212 lb (96.2 kg), SpO2 98 %. Body mass index is 36.39 kg/m.  General:  Cooperative, alert, well developed, in no acute distress. HEENT: Conjunctivae and lids unremarkable. Cardiovascular: Regular rhythm.  Lungs: Normal work of breathing. Neurologic: No focal deficits.   Lab Results  Component Value Date   CREATININE 0.67 09/22/2019   BUN 8 09/22/2019   NA 138 09/22/2019   K 4.3 09/22/2019   CL 102 09/22/2019   CO2 23 09/22/2019    Lab Results  Component Value Date   ALT 17 09/22/2019   AST 19 09/22/2019   ALKPHOS 79 09/22/2019   BILITOT 0.6 09/22/2019   Lab Results  Component Value Date   HGBA1C 5.5 09/22/2019   HGBA1C 5.5 09/02/2012   HGBA1C 5.6 10/08/2010   HGBA1C 5.3 05/18/2008   Lab Results  Component Value Date   INSULIN 8.1 09/22/2019   Lab Results  Component Value Date   TSH 0.360 (L) 09/22/2019   Lab Results  Component Value Date   CHOL 182 09/22/2019   HDL 55 09/22/2019   LDLCALC 105 (H) 09/22/2019   LDLDIRECT 161.6 06/03/2012   TRIG 123 09/22/2019   CHOLHDL 3.8 08/28/2019   Lab Results  Component Value Date   WBC 6.2 09/22/2019   HGB 13.2 09/22/2019   HCT 41.7 09/22/2019   MCV 93 09/22/2019   PLT 254 09/22/2019   No results found for: IRON, TIBC, FERRITIN  Attestation Statements:   Reviewed by clinician on day of visit: allergies, medications, problem list, medical history, surgical history, family history, social history, and previous encounter notes.   I, Trixie Dredge, am acting as transcriptionist for Dennard Nip, MD.  I have reviewed the above documentation for accuracy and completeness, and I agree with the above. -  Dennard Nip, MD

## 2019-10-20 ENCOUNTER — Ambulatory Visit (INDEPENDENT_AMBULATORY_CARE_PROVIDER_SITE_OTHER): Payer: Medicare PPO | Admitting: Family Medicine

## 2019-10-23 ENCOUNTER — Telehealth: Payer: Self-pay | Admitting: Family Medicine

## 2019-10-23 NOTE — Telephone Encounter (Signed)
Pt is calling to give new pharmacy information   [facility] [place], [id number]

## 2019-10-23 NOTE — Telephone Encounter (Signed)
Pharmacy has been updated.

## 2019-10-26 ENCOUNTER — Emergency Department (HOSPITAL_COMMUNITY)
Admission: EM | Admit: 2019-10-26 | Discharge: 2019-10-27 | Disposition: A | Discharge status: Home / Self Care | Payer: Medicare PPO | Attending: Emergency Medicine | Admitting: Emergency Medicine

## 2019-10-26 ENCOUNTER — Emergency Department (HOSPITAL_COMMUNITY): Payer: Medicare PPO

## 2019-10-26 ENCOUNTER — Other Ambulatory Visit: Payer: Self-pay

## 2019-10-26 ENCOUNTER — Encounter (HOSPITAL_COMMUNITY): Payer: Self-pay

## 2019-10-26 DIAGNOSIS — R072 Precordial pain: Secondary | ICD-10-CM | POA: Insufficient documentation

## 2019-10-26 DIAGNOSIS — M542 Cervicalgia: Secondary | ICD-10-CM | POA: Insufficient documentation

## 2019-10-26 DIAGNOSIS — M25512 Pain in left shoulder: Secondary | ICD-10-CM | POA: Diagnosis not present

## 2019-10-26 DIAGNOSIS — R079 Chest pain, unspecified: Secondary | ICD-10-CM | POA: Diagnosis not present

## 2019-10-26 DIAGNOSIS — Z5321 Procedure and treatment not carried out due to patient leaving prior to being seen by health care provider: Secondary | ICD-10-CM | POA: Diagnosis not present

## 2019-10-26 LAB — BASIC METABOLIC PANEL
Anion gap: 7 (ref 5–15)
BUN: 8 mg/dL (ref 8–23)
CO2: 26 mmol/L (ref 22–32)
Calcium: 8.9 mg/dL (ref 8.9–10.3)
Chloride: 103 mmol/L (ref 98–111)
Creatinine, Ser: 0.58 mg/dL (ref 0.44–1.00)
GFR calc Af Amer: >60 mL/min (ref >60)
GFR calc non Af Amer: >60 mL/min (ref >60)
Glucose, Bld: 105 mg/dL — ABNORMAL HIGH (ref 70–99)
Potassium: 3.4 mmol/L — ABNORMAL LOW (ref 3.5–5.1)
Sodium: 136 mmol/L (ref 135–145)

## 2019-10-26 LAB — CBC
HCT: 38.2 % (ref 36.0–46.0)
Hemoglobin: 13.1 g/dL (ref 12.0–15.0)
MCH: 30.2 pg (ref 26.0–34.0)
MCHC: 34.3 g/dL (ref 30.0–36.0)
MCV: 88 fL (ref 80.0–100.0)
Platelets: 238 10*3/uL (ref 150–400)
RBC: 4.34 MIL/uL (ref 3.87–5.11)
RDW: 13.4 % (ref 11.5–15.5)
WBC: 8.5 10*3/uL (ref 4.0–10.5)
nRBC: 0 % (ref 0.0–0.2)

## 2019-10-26 LAB — TROPONIN I (HIGH SENSITIVITY): Troponin I (High Sensitivity): <2 ng/L (ref <18)

## 2019-10-26 NOTE — ED Triage Notes (Signed)
Pt reports intermittent sternal cheat pain x 2 days that is worse in the evening. Pain radiates to left neck and shoulder.

## 2019-10-27 ENCOUNTER — Telehealth: Payer: Self-pay | Admitting: Family Medicine

## 2019-10-27 ENCOUNTER — Other Ambulatory Visit (INDEPENDENT_AMBULATORY_CARE_PROVIDER_SITE_OTHER): Payer: Self-pay | Admitting: Family Medicine

## 2019-10-27 DIAGNOSIS — E559 Vitamin D deficiency, unspecified: Secondary | ICD-10-CM

## 2019-10-27 NOTE — Telephone Encounter (Signed)
Patient has a question about her BP medication.  Her BP has been running very high.  Pt was transferred to talk to triage nurse.

## 2019-10-27 NOTE — Telephone Encounter (Signed)
You can use the 4pm slot to schedule her an appt. We'll need to check the BP in office & most likely triage is going to suggest seeing her PCP.

## 2019-10-28 DIAGNOSIS — R5383 Other fatigue: Secondary | ICD-10-CM | POA: Diagnosis not present

## 2019-10-28 DIAGNOSIS — R519 Headache, unspecified: Secondary | ICD-10-CM | POA: Diagnosis not present

## 2019-10-28 MED ORDER — LOSARTAN POTASSIUM 25 MG PO TABS: 25.0000 mg | ORAL_TABLET | Freq: Every day | ORAL | 1 refills | Status: DC

## 2019-10-28 MED ORDER — AMLODIPINE BESYLATE 5 MG PO TABS: ORAL_TABLET | ORAL | 1 refills | Status: DC

## 2019-10-28 NOTE — Telephone Encounter (Signed)
FYI   I spoke with pt, she is feeling much better today. Her blood pressure is back to normal, and her covid test was negative. She is aware to let us know if anything changes. [facility] [address]

## 2019-10-28 NOTE — Telephone Encounter (Signed)
I spoke to the patient, Veronica [name] is going to get a Covid test today.  Veronica [name] isn't feeling well.  Veronica [name] is requesting for Judson Roch to call her around 1:00 today.  Veronica [name] will be out getting her test at 11:45.

## 2019-10-30 ENCOUNTER — Telehealth: Payer: Self-pay

## 2019-10-30 NOTE — Telephone Encounter (Signed)
Increase Losartan dose from 25 mg to 50 mg. No changes in Amlodipine. F/U in a week, bring BP monitor with her. If headache suddenly gets worse or focal weakness or BP > 180/105 she needs to go to acute care. Thanks, BJ

## 2019-10-30 NOTE — Telephone Encounter (Signed)
Patient called in with a BP reading of 162/90. No chest pain, does have a headache. She currently takes Losartan 25 mg in the morning, and Amlodipine 5 mg at bedtime. She said that she's been getting high readings during the afternoons and the evenings.   Please advise on if medications need to be increased.

## 2019-10-30 NOTE — Telephone Encounter (Signed)
I spoke with pt. She is aware of the information below. F/u appt made for 10/6 at [place].

## 2019-11-04 ENCOUNTER — Ambulatory Visit: Payer: Medicare PPO | Admitting: Family Medicine

## 2019-11-04 ENCOUNTER — Other Ambulatory Visit: Payer: Self-pay

## 2019-11-04 ENCOUNTER — Encounter: Payer: Self-pay | Admitting: Family Medicine

## 2019-11-04 VITALS — BP 120/72 | HR 98 | Resp 16 | Ht 64.0 in | Wt 212.0 lb

## 2019-11-04 DIAGNOSIS — M5416 Radiculopathy, lumbar region: Secondary | ICD-10-CM

## 2019-11-04 DIAGNOSIS — F411 Generalized anxiety disorder: Secondary | ICD-10-CM | POA: Diagnosis not present

## 2019-11-04 DIAGNOSIS — Z6836 Body mass index (BMI) 36.0-36.9, adult: Secondary | ICD-10-CM

## 2019-11-04 DIAGNOSIS — I10 Essential (primary) hypertension: Secondary | ICD-10-CM

## 2019-11-04 DIAGNOSIS — M542 Cervicalgia: Secondary | ICD-10-CM | POA: Diagnosis not present

## 2019-11-04 MED ORDER — DULOXETINE HCL 30 MG PO CPEP: 30.0000 mg | ORAL_CAPSULE | Freq: Every day | ORAL | 1 refills | Status: DC

## 2019-11-04 MED ORDER — LOSARTAN POTASSIUM 25 MG PO TABS
50.0000 mg | ORAL_TABLET | Freq: Every day | ORAL | 1 refills | Status: DC
Start: 2019-11-04 — End: 2020-02-12

## 2019-11-04 NOTE — Assessment & Plan Note (Signed)
Encouraged to be consistent with following a healthful diet and engaging in regular physical activity. She is following with weight loss clinic.

## 2019-11-04 NOTE — Patient Instructions (Addendum)
A few things to remember from today's visit:  Essential hypertension  Generalized anxiety disorder  Class 2 severe obesity due to excess calories with serious comorbidity and body mass index (BMI) of 36.0 to 36.9 in adult [facility])  Neck pain on left side  Lumbar back pain with radiculopathy affecting left lower extremity  If you need refills please call your pharmacy. Do not use My Chart to request refills or for acute issues that need immediate attention.   Today we decrease dose of Duloxetine from 60 mg to 30 mg. Neck massage and topical icy hot.    Please be sure medication list is accurate. If a new problem present, please set up appointment sooner than planned today.

## 2019-11-04 NOTE — Assessment & Plan Note (Signed)
She feels like problem has improved. She would like to try lower dose of duloxetine, dose decreased from 60 mg to 30 mg daily.  If she feels like anxiety is getting worse, she will let me know so we can go back to 60 mg. Continue alprazolam 0.25 mg daily as needed. Some side effects of medication discussed.

## 2019-11-04 NOTE — Assessment & Plan Note (Signed)
BP adequately controlled. Continue amlodipine 5 mg and losartan 25 mg daily. Low-salt diet. Continue monitoring BP regularly.

## 2019-11-04 NOTE — Assessment & Plan Note (Addendum)
Probably has improved some with duloxetine 60 mg daily. Today duloxetine was decreased from 60 mg to 30 mg, she will let me know if lower dose is still helps with back pain. Weight loss will also help.

## 2019-11-04 NOTE — Progress Notes (Signed)
HPI: Veronica [name] is a 69 y.o. female, who is here today for follow up.   She was last seen on 08/28/19.  She was in the ER on 10/28/19 because occipital headache and neck pain, headache has resolved. Still having mild left-sided neck pain, it is not radiated to upper extremities. BP was elevated at 180/95.  Exacerbated by certain movements. No limitation of ROM. No history of trauma. Slowly getting better.  Hypertension: Currently she is on amlodipine 5 mg daily at bedtime and losartan 25 mg daily. Home BP readings 120s/70s. She has tolerated medication well. Negative for visual changes, chest pain, dyspnea, palpitation,focal weakness, or edema.  Lab Results  Component Value Date   CREATININE 0.58 10/26/2019   BUN 8 10/26/2019   NA 136 10/26/2019   K 3.4 (L) 10/26/2019   CL 103 10/26/2019   CO2 26 10/26/2019   Back pain: Chronic, sometimes pain is radiated to left thigh, with burning sensation. Problem has improved since she has not been walking to work, she is using scar therapies. Completed physical therapy.  Currently she is on duloxetine 60 mg daily, which she thinks is also helping. Pain is exacerbated by prolonged walking and alleviated by rest. Negative for saddle anesthesia and bowel/bladder dysfunction.  Anxiety is mild.  She feels like duloxetine 60 mg is helping, she wonders if she can try lower dose. Last visit she was concerned about her son, who was going through a hard time, the situation has improved. Alprazolam 0.25 mg 2-3 per months.  She has taken the medication intermittently for years.  Review of Systems  Constitutional: Positive for fatigue. Negative for activity change, appetite change and fever.  HENT: Negative for mouth sores, nosebleeds and sore throat.   Respiratory: Negative for cough and wheezing.   Gastrointestinal: Negative for abdominal pain, nausea and vomiting.       Negative for changes in bowel habits.  Genitourinary:  Negative for decreased urine volume, dysuria and hematuria.  Musculoskeletal: Negative for gait problem.  Neurological: Negative for syncope, facial asymmetry and weakness.  Psychiatric/Behavioral: Negative for confusion. The patient is nervous/anxious.   Rest of ROS, see pertinent positives sand negatives in HPI  Current Outpatient Medications on File Prior to Visit  Medication Sig Dispense Refill  . ALPRAZolam (XANAX) 0.25 MG tablet Take 0.25 mg by mouth at bedtime as needed.    Marland Kitchen amLODipine (NORVASC) 5 MG tablet TAKE 1 TABLET(5 MG) BY MOUTH EVERY MORNING 90 tablet 1  . ASPIRIN 81 PO Take by mouth daily.    . cyanocobalamin 1000 MCG tablet Take 1,000 mcg by mouth daily.    Marland Kitchen MEGARED OMEGA-3 KRILL OIL PO Take by mouth.    . pravastatin (PRAVACHOL) 80 MG tablet Take 1 tablet (80 mg total) by mouth daily. 90 tablet 3  . Vitamin D, Ergocalciferol, (DRISDOL) 1.25 MG (50000 UNIT) CAPS capsule Take 1 capsule (50,000 Units total) by mouth every 7 (seven) days. 4 capsule 0   No current facility-administered medications on file prior to visit.   Past Medical History:  Diagnosis Date  . Allergic rhinitis   . Anxiety and depression   . Constipation, chronic   . Coronary artery calcification seen on CAT scan 06/2010   Mild nonobstructive CAD on cardiac CT  -as of 2021, coronary calcium score 34  . Hemorrhoids   . Hip pain, bilateral   . Hyperlipidemia   . Hypertension   . Leukoplakia of vagina   . Low back pain  w/ left side radiculopathy, s/p injection at L4-L5 forarmen 1/06  . Uterine fibroid    No Known Allergies  Social History   Socioeconomic History  . Marital status: Divorced    Spouse name: Not on file  . Number of children: 3  . Years of education: Not on file  . Highest education level: Not on file  Occupational History  . 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 Smoking status: Never Smoker  . Smokeless  tobacco: Never Used  Substance and Sexual Activity  . Alcohol use: Yes    Comment: rarely  . Drug use: No  . Sexual activity: Never    Birth control/protection: Surgical  Other Topics Concern  . 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   Social Determinants of Health   Financial Resource Strain:   . Difficulty of Paying Living Expenses: Not on file  Food Insecurity:   . Worried About Charity fundraiser in the Last Year: Not on file  . Ran Out of Food in the Last Year: Not on file  Transportation Needs:   . Lack of Transportation (Medical): Not on file  . Lack of Transportation (Non-Medical): Not on file  Physical Activity:   . Days of Exercise per Week: Not on file  . Minutes of Exercise per Session: Not on file  Stress:   . Feeling of Stress : Not on file  Social Connections:   . Frequency of Communication with Friends and Family: Not on file  . Frequency of Social Gatherings with Friends and Family: Not on file  . Attends Religious Services: Not on file  . Active Member of Clubs or Organizations: Not on file  . Attends Archivist Meetings: Not on file  . Marital Status: Not on file   Vitals:   11/04/19 0802  BP: 120/72  Pulse: 98  Resp: 16  SpO2: 99%   Wt Readings from Last 3 Encounters:  11/04/19 212 lb (96.2 kg)  10/06/19 212 lb (96.2 kg)  09/22/19 213 lb (96.6 kg)   Body mass index is 36.39 kg/m.  Physical Exam Vitals and nursing note reviewed.  Constitutional:      General: She is not in acute distress.    Appearance: She is well-developed.  HENT:     Head: Normocephalic and atraumatic.     Mouth/Throat:     Mouth: Mucous membranes are moist.     Pharynx: Oropharynx is clear.  Eyes:     Conjunctiva/sclera: Conjunctivae normal.     Pupils: Pupils are equal, round, and reactive to light.   Cardiovascular:     Rate and Rhythm: Normal rate and regular rhythm.     Pulses:          Dorsalis pedis pulses are 2+ on the right side and 2+ on the left side.     Heart sounds: No murmur heard.   Pulmonary:     Effort: Pulmonary effort is normal. No respiratory distress.     Breath sounds: Normal breath sounds.  Abdominal:     Palpations: Abdomen is soft. There is no hepatomegaly or mass.     Tenderness: There is no abdominal tenderness.  Musculoskeletal:     Cervical back: Spasms and tenderness present. Pain  with movement present. Normal range of motion.       Back:  Lymphadenopathy:     Cervical: No cervical adenopathy.  Skin:    General: Skin is warm.     Findings: No erythema or rash.  Neurological:     Mental Status: She is alert and oriented to person, place, and time.     Cranial Nerves: No cranial nerve deficit.     Gait: Gait normal.  Psychiatric:     Comments: Well groomed, good eye contact.   ASSESSMENT AND PLAN:   Veronica [name] was seen today for  follow-up.  No orders of the defined types were placed in this encounter.  Essential hypertension BP adequately controlled. Continue amlodipine 5 mg and losartan 25 mg daily. Low-salt diet. Continue monitoring BP regularly.  Class 2 obesity with body mass index (BMI) of 36.0 to 36.9 in adult Encouraged to be consistent with following a healthful diet and engaging in regular physical activity. She is following with weight loss clinic.  Anxiety disorder She feels like problem has improved. She would like to try lower dose of duloxetine, dose decreased from 60 mg to 30 mg daily.  If she feels like anxiety is getting worse, she will let me know so we can go back to 60 mg. Continue alprazolam 0.25 mg daily as needed. Some side effects of medication discussed.  Lumbar back pain with radiculopathy affecting left lower extremity Probably has improved some with duloxetine 60 mg daily. Today duloxetine was  decreased from 60 mg to 30 mg, she will let me know if lower dose is still helps with back pain. Weight loss will also help.  Neck pain on left side For now she prefers to hold on muscle relaxants. Recommend local massage, ice/heat, and OTC IcyHot/Aspercreme. ROM exercises may also help.  Return in about 6 months (around 05/04/2020).   Eller Sweis G. Martinique, MD  [facility]. [place] office.   A few things to remember from today's visit:  Essential hypertension  Generalized anxiety disorder  Class 2 severe obesity due to excess calories with serious comorbidity and body mass index (BMI) of 36.0 to 36.9 in adult [facility])  Neck pain on left side  Lumbar back pain with radiculopathy affecting left lower extremity  If you need refills please call your pharmacy. Do not use My Chart to request refills or for acute issues that need immediate attention.   Today we decrease dose of Duloxetine from 60 mg to 30 mg. Neck massage and topical icy hot.    Please be sure medication list is accurate. If a new problem present, please set up appointment sooner than planned today.

## 2019-11-18 NOTE — Progress Notes (Signed)
Virtual Visit via Telephone Note   This visit type was conducted due to national recommendations for restrictions regarding the COVID-19 Pandemic (e.g. social distancing) in an effort to limit this patient's exposure and mitigate transmission in our community.  Due to her co-morbid illnesses, this patient is at least at moderate risk for complications without adequate follow up.  This format is felt to be most appropriate for this patient at this time.  The patient did not have access to video technology/had technical difficulties with video requiring transitioning to audio format only (telephone).  All issues noted in this document were discussed and addressed.  No physical exam could be performed with this format.  Please refer to the patient's chart for her  consent to telehealth for [facility].  Evaluation Performed:  Follow-up visit  This visit type was conducted due to national recommendations for restrictions regarding the COVID-19 Pandemic (e.g. social distancing).  This format is felt to be most appropriate for this patient at this time.  All issues noted in this document were discussed and addressed.  No physical exam was performed (except for noted visual exam findings with Video Visits).  Please refer to the patient's chart (MyChart message for video visits and phone note for telephone visits) for the patient's consent to telehealth for [facility]  Date:  11/23/2019   ID:  Veronica [name], DOB Feb 13, 1950, MRN [id number]  Patient Location:  [place] [address]   Provider location:     [place] [address] Office [phone] Fax [phone]   PCP:  Martinique, Betty G, MD  Cardiologist:  Glenetta Hew, MD  Electrophysiologist:  None   Chief Complaint:  Lightheadnss/palpitations  History of Present Illness:    Veronica [name] is a 69 y.o. female who presents via audio/video conferencing  for a telehealth visit today.  Patient verified DOB and address.  She has a PMH of coronary artery disease.  She was initially seen 11/20 by Dr. Ellyn Hack.  She had undergone cardiac CT in 2012 with a very low coronary calcium score and minimal coronary artery disease.  She also had a negative stress test.  She was last seen by Dr. Ellyn Hack virtually on 02/12/2019.  She was doing well at that time.  She was tolerating her pravastatin well.  Her blood pressures were well controlled.  She denied chest pain, and dyspnea.  She did indicate that she would get short of breath with increased physical activity such as carrying objects up stairs.  She presented to the emergency department on 10/28/2019 with headache, and left-sided neck pain.  She also was noted to have elevated blood pressure 180/95.  Pain had resolved at her PCP visit 11/04/2019 and home blood pressure readings were 120s over 70s.  She presents to the clinic today for follow-up evaluation states she has noticed episodes of lightheadedness at night.  She does not notice any lightheadedness during the day or with activity.  She also notices palpitations that coincide with her lightheadedness.  She presented to the emergency department for neck pain was evaluated by her PCP who recommended rest, ice, compression, OTC pain medication.  She indicated that her neck pain is getting better.  However, she is still experiencing headaches but they have improved somewhat as well.  I will order a 14-day ZIO monitor and have her follow-up in 8-10 weeks.  Today she denies chest pain, shortness of breath, lower extremity edema,  fatigue, palpitations, melena, hematuria, hemoptysis, diaphoresis, weakness, presyncope, syncope, orthopnea, and PND.   The patient does not symptoms concerning for COVID-19 infection (fever, chills, cough, or new SHORTNESS OF BREATH).    Prior CV studies:   The following studies were reviewed today:  Cardiac CT Angio - 2012:Coronary  calcium score 12. Mild nonobstructive disease. Ostial LAD and first D1/RI. Right dominant. Focal scarring and bronchiectasis of medial right lower lobe.  Nuclear stress test 10/12 Low risk, nonischemic  Coronary calcium score (January 02, 2019) Cor Calcium Score 34.  LOW Risk.  Mild aortic atherosclerosis noted    Past Medical History:  Diagnosis Date   Allergic rhinitis    Anxiety and depression    Constipation, chronic    Coronary artery calcification seen on CAT scan 06/2010   Mild nonobstructive CAD on cardiac CT  -as of 2021, coronary calcium score 34   Hemorrhoids    Hip pain, bilateral    Hyperlipidemia    Hypertension    Leukoplakia of vagina    Low back pain    w/ left side radiculopathy, s/p injection at L4-L5 forarmen 1/06   Uterine fibroid    Past Surgical History:  Procedure Laterality Date   ABDOMINAL HYSTERECTOMY  11-2000   no oophorectomy per pt   CHOLECYSTECTOMY  12/08/2010   Procedure: LAPAROSCOPIC CHOLECYSTECTOMY WITH INTRAOPERATIVE CHOLANGIOGRAM;  Surgeon: Pedro Earls, MD;  Location: [facility] ORS;  Service: General;  Laterality: N/A;  c-arm    COLONOSCOPY WITH PROPOFOL N/A 02/02/2014   Procedure: COLONOSCOPY WITH PROPOFOL;  Surgeon: Garlan Fair, MD;  Location: [facility] ENDOSCOPY;  Service: Endoscopy;  Laterality: N/A;   CORONARY CALCIUM SCORE  12/2018   Coronary calcium score-34   CORONARY CT ANGIOGRAM  06/2010    Coronary calcium score 12.  Mild nonobstructive disease.  Ostial LAD and first D1/RI.  Right dominant.  Focal scarring and bronchiectasis of medial right lower lobe.   LEEP     unknown date    NM MYOVIEW LTD  10/2010   Nonischemic.  Normal EF.   TUBAL LIGATION     bilateral 1989   UTERINE FIBROID SURGERY       Current Meds  Medication Sig   ALPRAZolam (XANAX) 0.25 MG tablet Take 0.25 mg by mouth at bedtime as needed.   amLODipine (NORVASC) 5 MG tablet TAKE 1 TABLET(5 MG) BY MOUTH EVERY MORNING (Patient taking  differently: TAKE 1 TABLET(5 MG) BY MOUTH EVERY EVENING)   ASPIRIN 81 PO Take by mouth daily.   cyanocobalamin 1000 MCG tablet Take 1,000 mcg by mouth daily.   DULoxetine (CYMBALTA) 30 MG capsule Take 1 capsule (30 mg total) by mouth daily.   losartan (COZAAR) 25 MG tablet Take 2 tablets (50 mg total) by mouth daily. (Patient taking differently: Take 25 mg by mouth in the morning. )   MEGARED OMEGA-3 KRILL OIL PO Take by mouth.   pravastatin (PRAVACHOL) 80 MG tablet Take 1 tablet (80 mg total) by mouth daily.   Vitamin D, Ergocalciferol, (DRISDOL) 1.25 MG (50000 UNIT) CAPS capsule Take 1 capsule (50,000 Units total) by mouth every 7 (seven) days.     Allergies:   Patient has no known allergies.   Social History   Tobacco Use   Smoking status: Never Smoker   Smokeless tobacco: Never Used  Substance Use Topics   Alcohol use: Yes    Comment: rarely   Drug use: No     Family Hx: The patient's family history includes AAA (  abdominal aortic aneurysm) (age of onset: 43) in her father; Anxiety disorder in her mother; CVA in her brother; Heart attack (age of onset: 60) in her mother; Heart disease in her brother, father, and mother; Hyperlipidemia in her father and mother; Hypertension in her father and mother; Schizophrenia in her brother, son, and another family member; Valvular heart disease in her brother. There is no history of Diabetes, Colon cancer, or Breast cancer.  ROS:   Please see the history of present illness.     All other systems reviewed and are negative.   Labs/Other Tests and Data Reviewed:    Recent Labs: 09/22/2019: ALT 17; TSH 0.360 10/26/2019: BUN 8; Creatinine, Ser 0.58; Hemoglobin 13.1; Platelets 238; Potassium 3.4; Sodium 136   Recent Lipid Panel Lab Results  Component Value Date/Time   CHOL 182 09/22/2019 08:20 AM   TRIG 123 09/22/2019 08:20 AM   HDL 55 09/22/2019 08:20 AM   CHOLHDL 3.8 08/28/2019 10:48 AM   LDLCALC 105 (H) 09/22/2019 08:20 AM    LDLCALC 132 (H) 08/28/2019 10:48 AM   LDLDIRECT 161.6 06/03/2012 10:43 AM    Wt Readings from Last 3 Encounters:  11/04/19 212 lb (96.2 kg)  10/06/19 212 lb (96.2 kg)  09/22/19 213 lb (96.6 kg)     Exam:    Vital Signs:  BP (!) 152/73    Pulse 73    Well nourished, well developed female in no  acute distress.    ASSESSMENT & PLAN:    1.  Palpitations/lightheadedness-has noticed several episodes of lightheadedness that coincide with palpitations over the past several weeks.  Labs in the emergency department unremarkable.   Avoid triggers for palpitations-caffeine, nicotine, EtOH, chocolate etc. Order 14-day ZIO monitor   Essential hypertension -BP today 152/73.  Well-controlled at home 130s over 70s Continue amlodipine, losartan, Heart healthy low-sodium diet-salty 6 given Increase physical activity as tolerated  Hyperlipidemia-09/22/2019: Cholesterol, Total 182; HDL 55; LDL Chol Calc (NIH) 105; Triglycerides 123 Continue pravastatin, aspirin, omega-3 Heart healthy low-sodium high-fiber diet Increase physical activity as tolerated  Aortic atherosclerosis-noted on coronary CT and coronary calcium scoring.  Medical management/risk modification Continue pravastatin, aspirin Heart healthy low-sodium diet-salty 6 given Increase physical activity as tolerated   Disposition: Follow-up with Dr. Ellyn Hack or me in 8-10 weeks  COVID-19 Education: The signs and symptoms of COVID-19 were discussed with the patient and how to seek care for testing (follow up with PCP or arrange E-visit).  The importance of social distancing was discussed today.  Patient Risk:   After full review of this patients clinical status, I feel that they are at least moderate risk at this time.  Time:   Today, I have spent 12 minutes with the patient with telehealth technology discussing patient symptoms, recent lab work, medications.  I spent greater than 20 minutes reviewing patient's past medical history,  cardiac testing, and prior notes.   Medication Adjustments/Labs and Tests Ordered: Current medicines are reviewed at length with the patient today.  Concerns regarding medicines are outlined above.   Tests Ordered: Orders Placed This Encounter  Procedures   LONG TERM MONITOR (3-14 DAYS)   Medication Changes: No orders of the defined types were placed in this encounter.   Disposition:  in 10 week(s)  Signed, Jossie Ng. Iveliz Garay NP-C    09/02/2018 11:58 AM    [place] [address] Office [phone] Fax [phone]

## 2019-11-22 ENCOUNTER — Other Ambulatory Visit (INDEPENDENT_AMBULATORY_CARE_PROVIDER_SITE_OTHER): Payer: Self-pay | Admitting: Family Medicine

## 2019-11-22 DIAGNOSIS — E559 Vitamin D deficiency, unspecified: Secondary | ICD-10-CM

## 2019-11-23 ENCOUNTER — Encounter: Payer: Self-pay | Admitting: General Practice

## 2019-11-23 ENCOUNTER — Encounter: Payer: Self-pay | Admitting: *Deleted

## 2019-11-23 ENCOUNTER — Telehealth (INDEPENDENT_AMBULATORY_CARE_PROVIDER_SITE_OTHER): Payer: Medicare PPO | Admitting: General Practice

## 2019-11-23 VITALS — BP 152/73 | HR 73

## 2019-11-23 DIAGNOSIS — R42 Dizziness and giddiness: Secondary | ICD-10-CM

## 2019-11-23 DIAGNOSIS — E782 Mixed hyperlipidemia: Secondary | ICD-10-CM

## 2019-11-23 DIAGNOSIS — R002 Palpitations: Secondary | ICD-10-CM

## 2019-11-23 DIAGNOSIS — I10 Essential (primary) hypertension: Secondary | ICD-10-CM | POA: Diagnosis not present

## 2019-11-23 DIAGNOSIS — I700 Atherosclerosis of aorta: Secondary | ICD-10-CM | POA: Diagnosis not present

## 2019-11-23 NOTE — Progress Notes (Signed)
Patient ID: Veronica [name], female   DOB: 10-25-1950, 69 y.o.   MRN: [id number] Patient enrolled for [employer] to ship a 14 day ZIO XT long term holter monitor to her home with Spanish instructions.

## 2019-11-23 NOTE — Patient Instructions (Addendum)
Medication Instructions:  The current medical regimen is effective;  continue present plan and medications as directed. Please refer to the Current Medication list given to you today. *If you need a refill on your cardiac medications before your next appointment, please call your pharmacy*  Lab Work: NONE  Testing/Procedures:  [place] Monitor Instructions   Your physician has requested you wear your ZIO patch monitor 14 days.   This is a single patch monitor.  [employer] supplies one patch monitor per enrollment.  Additional stickers are not available.   Please do not apply patch if you will be having a Nuclear Stress Test, Echocardiogram, Cardiac CT, MRI, or Chest Xray during the time frame you would be wearing the monitor. The patch cannot be worn during these tests.  You cannot remove and re-apply the ZIO XT patch monitor.   Your ZIO patch monitor will be sent USPS Priority mail from [facility] directly to your home address. The monitor may also be mailed to a PO BOX if home delivery is not available.   It may take 3-5 days to receive your monitor after you have been enrolled.   Once you have received you monitor, please review enclosed instructions.  Your monitor has already been registered assigning a specific monitor serial # to you.   Applying the monitor  Hold abrader disc by orange tab.  Rub abrader in 40 strokes over left upper chest as indicated in your monitor instructions.   Clean area with 4 enclosed alcohol pads .  Use all pads to assure are is cleaned thoroughly.  Let dry.   Apply patch as indicated in monitor instructions.  Patch will be place under collarbone on left side of chest with arrow pointing upward.   Rub patch adhesive wings for 2 minutes.Remove white label marked "1".  Remove white label marked "2".  Rub patch adhesive wings for 2 additional minutes.   While looking in a mirror, press and release button in center of patch.  A small green  light will flash 3-4 times .  This will be your only indicator the monitor has been turned on.     Do not shower for the first 24 hours.  You may shower after the first 24 hours.   Press button if you feel a symptom. You will hear a small click.  Record Date, Time and Symptom in the Patient Log Book.   When you are ready to remove patch, follow instructions on last 2 pages of Patient Log Book.  Stick patch monitor onto last page of Patient Log Book.   Place Patient Log Book in [place] box.  Use locking tab on box and tape box closed securely.  The [employer] has [employer] on it.  Please place in mailbox as soon as possible.  Your physician should have your test results approximately 7 days after the monitor has been mailed back to [facility].   Call [facility] at [phone] if you have questions regarding your ZIO XT patch monitor.  Call them immediately if you see an orange light blinking on your monitor.   If your monitor falls off in less than 4 days contact our Monitor department at [phone].  If your monitor becomes loose or falls off after 4 days call [employer] at [phone] for suggestions on securing your monitor.    Special Instructions INCREASE HYDRATION  TAKE BLOOD PRESSURE WHEN LIGHTHEADED EPISODE  MAY TAKE TYLENOL 650MG  EVERY 4-6 HOURS  AS NEEDED FOR PAIN(NO IBUPROFEN) 3 GRAMS TOTAL PER DAY  Please try to avoid these triggers:  Do not use any products that have nicotine or tobacco in them. These include cigarettes, e-cigarettes, and chewing tobacco. If you need help quitting, ask your doctor.  Eat heart-healthy foods. Talk with your doctor about the right eating plan for you.  Exercise regularly as told by your doctor.  Do not drink alcohol, Caffeine or chocolate.  Lose weight if you are overweight.  Do not use drugs, including cannabis   MAY USE ICE ON FOR 20 MINUTES-OFF FOR 20 MINUTES  Follow-Up: Your next appointment:   8-10 week(s) In Person with Glenetta Hew, MD -[place], FNP-C  At [facility], you and your health needs are our priority.  As part of our continuing mission to provide you with exceptional heart care, we have created designated Provider Care Teams.  These Care Teams include your primary Cardiologist (physician) and Advanced Practice Providers (APPs -  Physician Assistants and Nurse Practitioners) who all work together to provide you with the care you need, when you need it.

## 2019-11-27 ENCOUNTER — Other Ambulatory Visit (INDEPENDENT_AMBULATORY_CARE_PROVIDER_SITE_OTHER): Payer: Medicare PPO

## 2019-11-27 DIAGNOSIS — R002 Palpitations: Secondary | ICD-10-CM

## 2019-11-27 DIAGNOSIS — R42 Dizziness and giddiness: Secondary | ICD-10-CM | POA: Diagnosis not present

## 2019-12-14 ENCOUNTER — Encounter: Payer: Self-pay | Admitting: Family Medicine

## 2019-12-14 ENCOUNTER — Encounter: Payer: Medicare PPO | Admitting: Family Medicine

## 2019-12-14 NOTE — Progress Notes (Signed)
No show. Link sent x 3 and called, left message in her voice mail. Mekiah Wahler Martinique, MD

## 2019-12-28 DIAGNOSIS — R002 Palpitations: Secondary | ICD-10-CM | POA: Diagnosis not present

## 2019-12-28 DIAGNOSIS — R42 Dizziness and giddiness: Secondary | ICD-10-CM | POA: Diagnosis not present

## 2020-01-12 ENCOUNTER — Other Ambulatory Visit: Payer: Self-pay

## 2020-01-12 NOTE — Progress Notes (Deleted)
Error

## 2020-01-13 ENCOUNTER — Ambulatory Visit: Payer: Medicare PPO | Admitting: Adult Health

## 2020-01-13 ENCOUNTER — Encounter: Payer: Self-pay | Admitting: Adult Health

## 2020-01-13 ENCOUNTER — Telehealth: Payer: Self-pay | Admitting: Family Medicine

## 2020-01-13 VITALS — BP 135/75 | HR 78 | Temp 97.0°F | Resp 18 | Ht 65.0 in | Wt 214.2 lb

## 2020-01-13 DIAGNOSIS — H532 Diplopia: Secondary | ICD-10-CM | POA: Diagnosis not present

## 2020-01-13 DIAGNOSIS — R42 Dizziness and giddiness: Secondary | ICD-10-CM

## 2020-01-13 MED ORDER — MECLIZINE HCL 25 MG PO TABS: 25.0000 mg | ORAL_TABLET | Freq: Three times a day (TID) | ORAL | 0 refills | Status: DC | PRN

## 2020-01-13 NOTE — Telephone Encounter (Signed)
Can anything be done? I know they're backed up on MRI's.

## 2020-01-13 NOTE — Telephone Encounter (Signed)
Pt is scheduled with [facility] for 12/21 at 10am. Pt is aware. Order has been faxed with demographics & pt's insurance.

## 2020-01-13 NOTE — Telephone Encounter (Signed)
There really is nothing that can be done from our end. I have a lot of patients waiting on an MRI as well.   Sorry

## 2020-01-13 NOTE — Addendum Note (Signed)
Addended by: Rodrigo Ran on: 01/13/2020 04:19 PM   Modules accepted: Orders

## 2020-01-13 NOTE — Telephone Encounter (Signed)
Pt is calling in to see if she can get an appointment this month for the MRI at [facility] is stating that they can not get her in until January and would like to see if Dr. Martinique can help her with getting an appointment this month b/c she doesn't want to wait that long.  Pt state that in the mornings she really feel sick and feel like she is going to pass out.

## 2020-01-13 NOTE — Patient Instructions (Signed)
It was great seeing you today   I have prescribed a medication called Meclizine for the dizziness,I think you may have vertigo  I have also ordered an MRI of the head - they will call you to schedule your appointment   Please see your eye doctor

## 2020-01-13 NOTE — Progress Notes (Signed)
Subjective:    Patient ID: Veronica [name], female    DOB: December 03, 1950, 69 y.o.   MRN: [id number]  HPI 69 year old female who  has a past medical history of Allergic rhinitis, Anxiety and depression, Constipation, chronic, Coronary artery calcification seen on CAT scan (06/2010), Hemorrhoids, Hip pain, bilateral, Hyperlipidemia, Hypertension, Leukoplakia of vagina, Low back pain, and Uterine fibroid.  She is a patient of Dr. Martinique who I am seeing today for an  issue of " feeling like I am going to pass out". This has been present for a few months but have been getting " worse" and more frequent. She denies syncopal episodes. She has the sensation of " the world is spinning when she moves her head but also feels lightheaded.  She also reports loss of vision and/or double vision when she moves or head or is walking   She has been seen by cardiology, coronary calcium score in December 2020 showed a score of 34 low risk.  Mild aortic atherosclerosis noted.  He also recently completed a Zio patch and has a follow-up with cardiology scheduled.  Denies chest pain, shortness of breath, fatigue, palpitations. Does endorse mild intermittent headaches since September   Review of Systems   See HPI    Past Medical History:  Diagnosis Date  . Allergic rhinitis   . Anxiety and depression   . Constipation, chronic   . Coronary artery calcification seen on CAT scan 06/2010   Mild nonobstructive CAD on cardiac CT  -as of 2021, coronary calcium score 34  . Hemorrhoids   . Hip pain, bilateral   . Hyperlipidemia   . Hypertension   . Leukoplakia of vagina   . Low back pain    w/ left side radiculopathy, s/p injection at L4-L5 forarmen 1/06  . Uterine fibroid     Social History   Socioeconomic History  . Marital status: Divorced    Spouse name: Not on file  . Number of children: 3  . Years of education: Not on file  . Highest education level: Not on file  Occupational History  . 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 Smoking status: Never Smoker  . Smokeless tobacco: Never Used  Substance and Sexual Activity  . Alcohol use: Yes    Comment: rarely  . Drug use: No  . Sexual activity: Never    Birth control/protection: Surgical  Other Topics Concern  . 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   Social Determinants of Health   Financial Resource Strain: Not on file  Food Insecurity: Not on file  Transportation Needs: Not on file  Physical Activity: Not on file  Stress: Not on file  Social Connections: Not on file  Intimate Partner Violence: Not on file    Past Surgical History:  Procedure Laterality Date  . ABDOMINAL HYSTERECTOMY  11-2000   no oophorectomy per pt  . CHOLECYSTECTOMY  12/08/2010   Procedure: LAPAROSCOPIC CHOLECYSTECTOMY WITH INTRAOPERATIVE CHOLANGIOGRAM;  Surgeon: Pedro Earls, MD;  Location: [facility];  Service: General;  Laterality: N/A;  c-arm   . COLONOSCOPY WITH PROPOFOL N/A 02/02/2014   Procedure: COLONOSCOPY WITH PROPOFOL;  Surgeon: Garlan Fair, MD;  Location: [facility];  Service: Endoscopy;  Laterality:  N/A;  . CORONARY CALCIUM SCORE  12/2018   Coronary calcium score-34  . CORONARY CT ANGIOGRAM  06/2010    Coronary calcium score 12.  Mild nonobstructive disease.  Ostial LAD and first D1/RI.  Right dominant.  Focal scarring and bronchiectasis of medial right lower lobe.  Marland Kitchen LEEP     unknown date   . NM MYOVIEW LTD  10/2010   Nonischemic.  Normal EF.  . TUBAL LIGATION     bilateral 1989  . UTERINE FIBROID SURGERY      Family History  Problem Relation Age of Onset  . Schizophrenia Son   . Schizophrenia Brother   . CVA Brother   . Valvular heart disease Brother        Valve Surgery  . Schizophrenia Other        2 nieces   . Hypertension Mother   . Hyperlipidemia Mother   . Heart attack Mother 65  . Heart disease Mother   . Anxiety disorder Mother   . Hypertension Father   . Hyperlipidemia Father   . AAA (abdominal aortic aneurysm) Father 62  . Heart disease Father   . Heart disease Brother        Vavle surgery  . Diabetes Neg Hx   . Colon cancer Neg Hx   . Breast cancer Neg Hx     No Known Allergies  Current Outpatient Medications on File Prior to Visit  Medication Sig Dispense Refill  . ALPRAZolam (XANAX) 0.25 MG tablet Take 0.25 mg by mouth at bedtime as needed.    Marland Kitchen amLODipine (NORVASC) 5 MG tablet TAKE 1 TABLET(5 MG) BY MOUTH EVERY MORNING (Patient taking differently: TAKE 1 TABLET(5 MG) BY MOUTH EVERY EVENING) 90 tablet 1  . ASPIRIN 81 PO Take by mouth daily.    . cyanocobalamin 1000 MCG tablet Take 1,000 mcg by mouth daily.    Marland Kitchen losartan (COZAAR) 25 MG tablet Take 2 tablets (50 mg total) by mouth daily. (Patient taking differently: Take 25 mg by mouth in the morning.) 90 tablet 1  . MEGARED OMEGA-3 KRILL OIL PO Take by mouth.    . pravastatin (PRAVACHOL) 80 MG tablet Take 1 tablet (80 mg total) by mouth daily. 90 tablet 3  . Vitamin D, Ergocalciferol, (DRISDOL) 1.25 MG (50000 UNIT) CAPS capsule Take 1 capsule (50,000 Units total) by mouth every 7 (seven) days. 4 capsule 0  . DULoxetine (CYMBALTA) 30 MG capsule Take 1 capsule (30 mg total) by mouth daily. (Patient not taking: Reported on 01/13/2020) 90 capsule 1   No current facility-administered medications on file prior to visit.    BP 135/75   Pulse 78   Temp (!) 97 F (36.1 C) (Oral)   Resp 18   Ht 5\' 5"  (1.651 m)   Wt 214 lb 3.2 oz (97.2 kg)   SpO2 97%   BMI 35.64 kg/m       Objective:   Physical Exam Vitals and nursing note reviewed.  Constitutional:      Appearance: Normal appearance.  Eyes:     Extraocular Movements:     Right eye: Nystagmus (mild horizontal nystagmus) present.     Left eye: Nystagmus (mild  horizontal nystagmus ) present.     Comments: She was symptomatic with dizziness when changing positions in the exam room.  Also had double vision bilaterally with tracking exam  Cardiovascular:     Rate and Rhythm: Normal rate and regular rhythm.     Pulses: Normal pulses.  Heart sounds: Normal heart sounds.  Pulmonary:     Effort: Pulmonary effort is normal.     Breath sounds: Normal breath sounds.  Abdominal:     Palpations: Abdomen is soft.  Skin:    General: Skin is warm and dry.     Capillary Refill: Capillary refill takes less than 2 seconds.  Neurological:     General: No focal deficit present.     Mental Status: She is alert and oriented to person, place, and time.  Psychiatric:        Mood and Affect: Mood normal.        Behavior: Behavior normal.        Thought Content: Thought content normal.        Judgment: Judgment normal.       Assessment & Plan:  1. Dizziness -Susy Frizzle related to some mild BPPV.  We will trial her on Antivert.  Also will order MRI of the brain due to worsening symptoms, headache, and double vision/loss of vision. - MR Brain Wo Contrast; Future - meclizine (ANTIVERT) 25 MG tablet; Take 1 tablet (25 mg total) by mouth 3 (three) times daily as needed for dizziness.  Dispense: 30 tablet; Refill: 0  2. Double vision with both eyes open  - MR Brain Wo Contrast; Future -Is also advised to follow-up with her eye doctor  Dorothyann Peng, NP

## 2020-01-15 ENCOUNTER — Ambulatory Visit: Payer: Medicare PPO | Admitting: General Practice

## 2020-01-19 DIAGNOSIS — R42 Dizziness and giddiness: Secondary | ICD-10-CM | POA: Diagnosis not present

## 2020-01-19 DIAGNOSIS — H532 Diplopia: Secondary | ICD-10-CM | POA: Diagnosis not present

## 2020-02-07 ENCOUNTER — Other Ambulatory Visit: Payer: Medicare PPO

## 2020-02-11 ENCOUNTER — Other Ambulatory Visit: Payer: Self-pay | Admitting: Family Medicine

## 2020-02-17 ENCOUNTER — Other Ambulatory Visit: Payer: Self-pay | Admitting: Family Medicine

## 2020-02-19 ENCOUNTER — Telehealth: Payer: Self-pay | Admitting: Family Medicine

## 2020-02-19 NOTE — Telephone Encounter (Signed)
MRI was ordered by Tommi Rumps and done at [facility].

## 2020-02-19 NOTE — Telephone Encounter (Signed)
I spoke with pt, she is aware of results. Appointment made for Monday at 4pm for pt to discuss with pcp.

## 2020-02-19 NOTE — Telephone Encounter (Signed)
Patient is requesting her MRI results. Please advise.

## 2020-02-19 NOTE — Telephone Encounter (Signed)
Since it was done at [facility], those dont come to me. It looks like it was completely normal though. If still having symptoms follow up with PCP and eye doctor

## 2020-02-22 ENCOUNTER — Telehealth (INDEPENDENT_AMBULATORY_CARE_PROVIDER_SITE_OTHER): Payer: Medicare PPO | Admitting: Family Medicine

## 2020-02-22 ENCOUNTER — Encounter: Payer: Self-pay | Admitting: Family Medicine

## 2020-02-22 VITALS — Wt 215.0 lb

## 2020-02-22 DIAGNOSIS — Z5329 Procedure and treatment not carried out because of patient's decision for other reasons: Secondary | ICD-10-CM

## 2020-02-26 IMAGING — DX DG CHEST 2V
2 series · 2 of 2 positions shown · non-contrast
Comparison: 03/05/2016.

CLINICAL DATA: Chest tightness for the past 3 days.

EXAM:
CHEST - 2 VIEW

[chest pa]
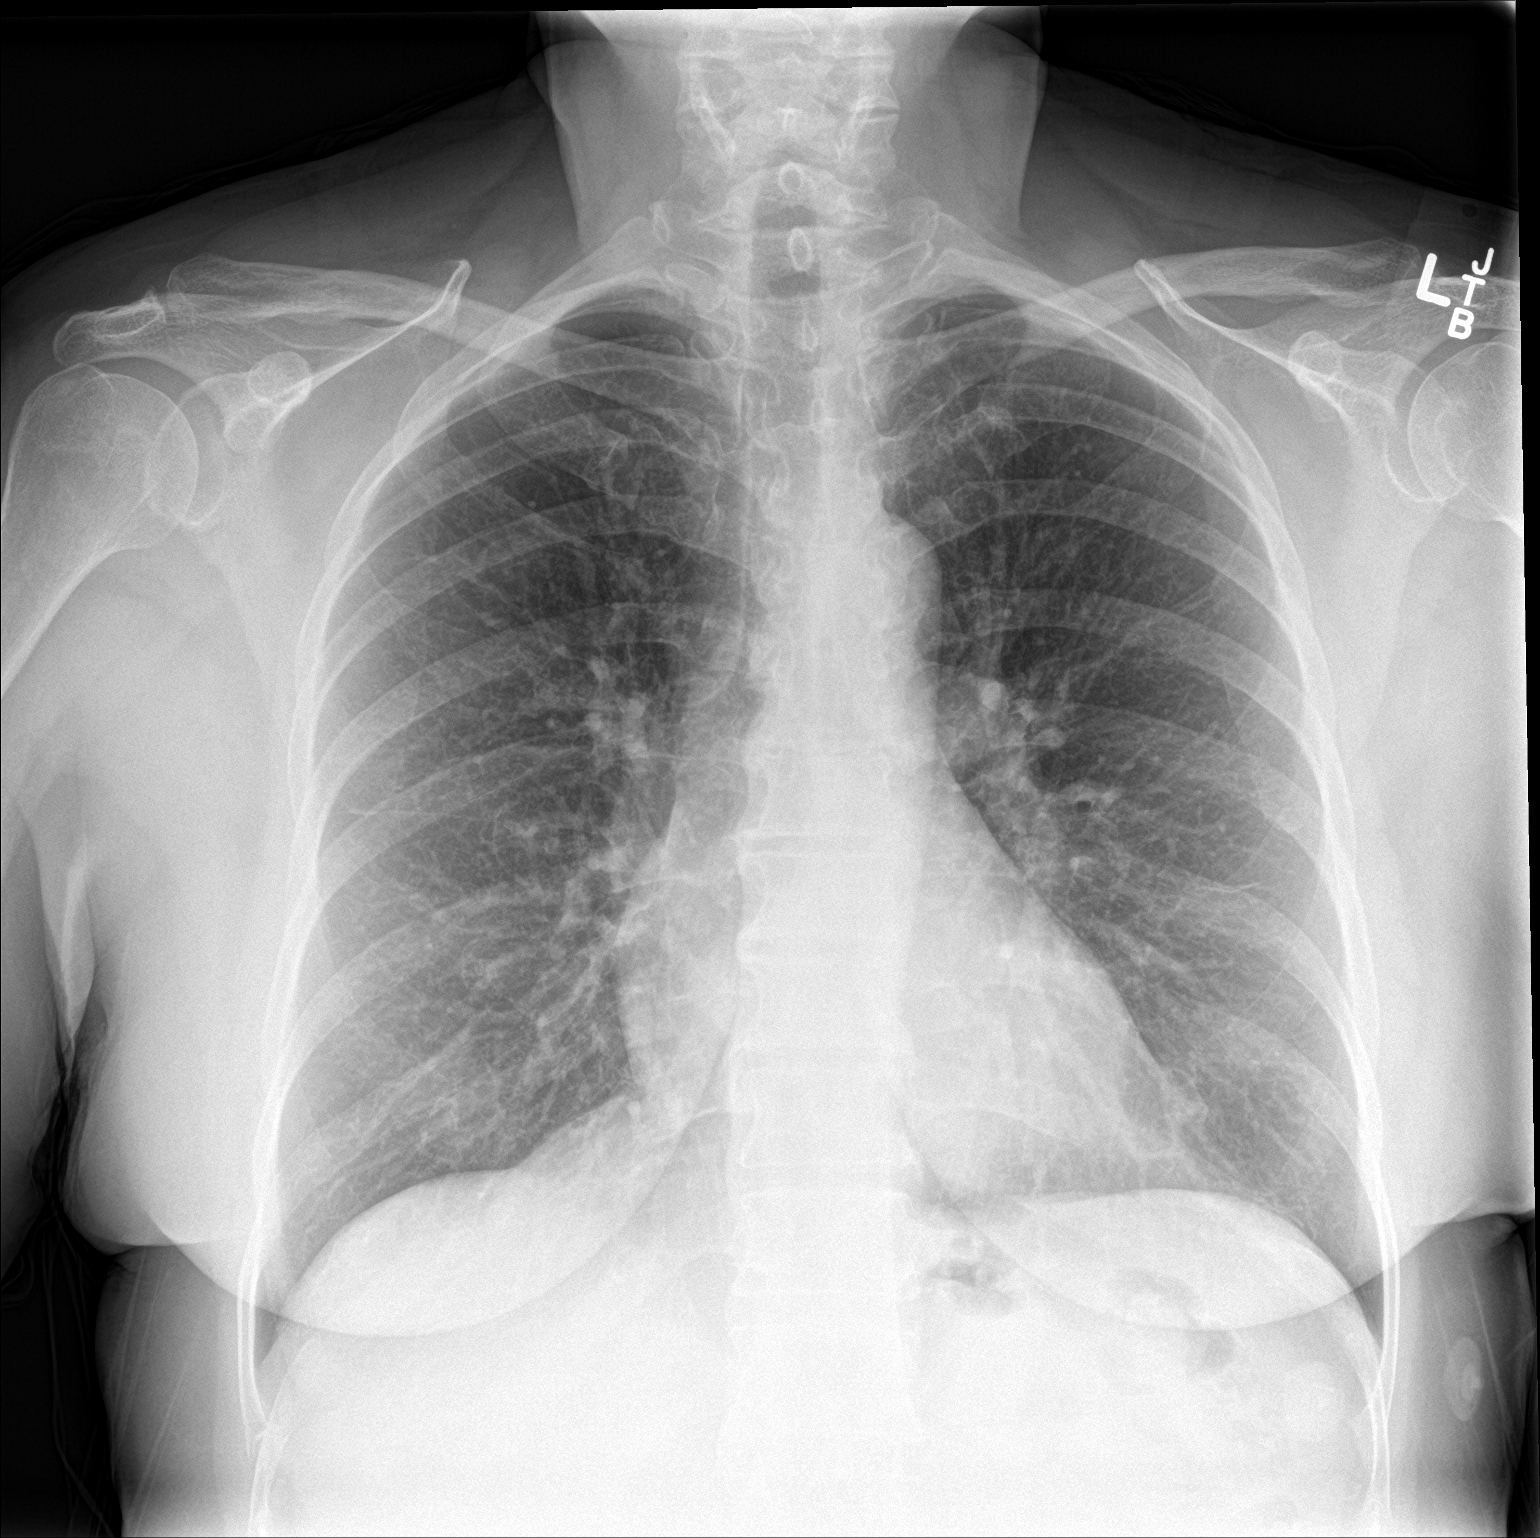

[chest lat]
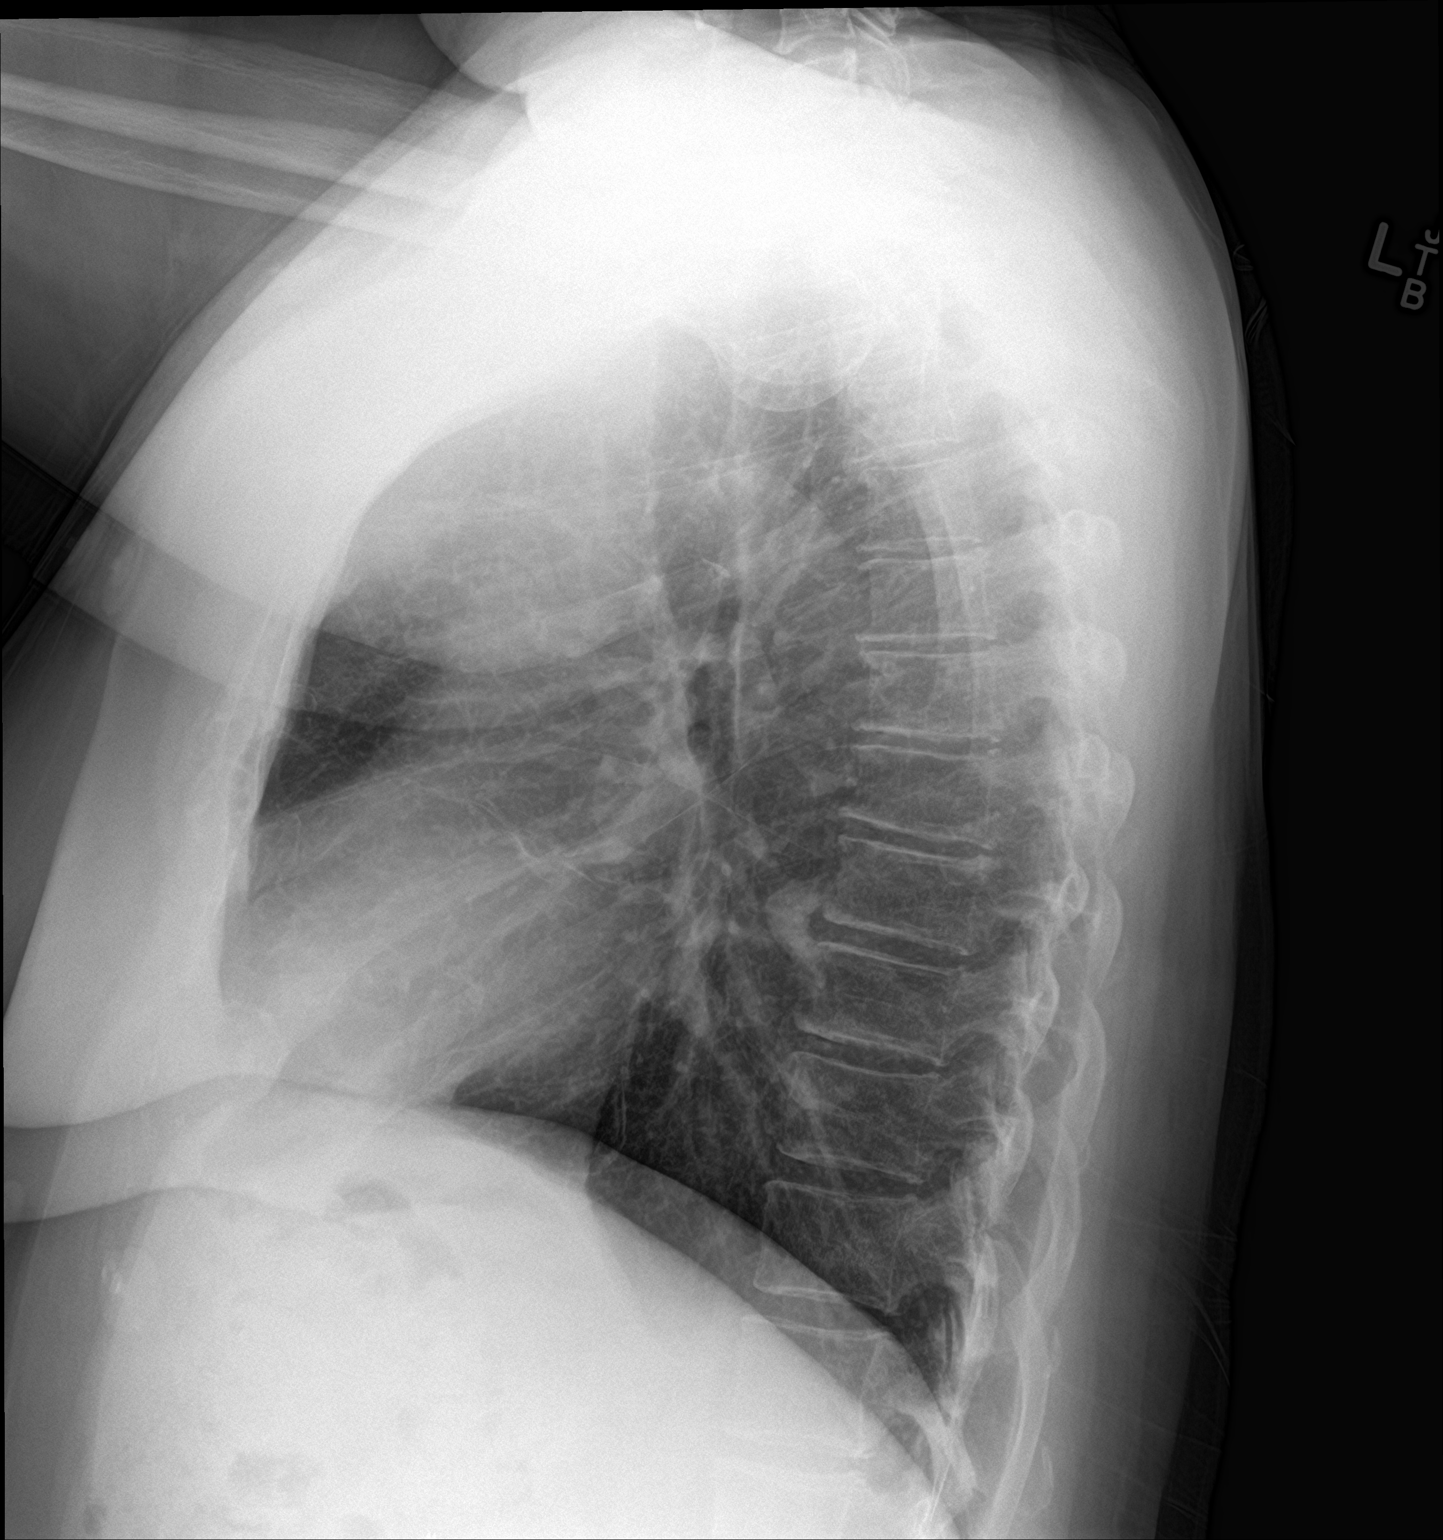

[2 of 2 positions shown; findings below may reference images not displayed]

FINDINGS: Normal sized heart. Clear lungs. The lungs remain mildly
hyperexpanded with stable mild diffuse peribronchial thickening and
accentuation of the interstitial markings. Stable small amount of
linear scarring in the lingula. Mild thoracic spine degenerative
changes.
IMPRESSION: No acute disease. Stable mild changes of COPD and chronic
bronchitis.

## 2020-03-02 ENCOUNTER — Other Ambulatory Visit: Payer: Self-pay

## 2020-03-02 ENCOUNTER — Ambulatory Visit (INDEPENDENT_AMBULATORY_CARE_PROVIDER_SITE_OTHER): Payer: Medicare PPO | Admitting: Family Medicine

## 2020-03-02 ENCOUNTER — Encounter: Payer: Self-pay | Admitting: Family Medicine

## 2020-03-02 VITALS — BP 120/74 | HR 77 | Temp 98.5°F | Resp 16 | Ht 65.0 in | Wt 219.0 lb

## 2020-03-02 DIAGNOSIS — I10 Essential (primary) hypertension: Secondary | ICD-10-CM | POA: Diagnosis not present

## 2020-03-02 DIAGNOSIS — F411 Generalized anxiety disorder: Secondary | ICD-10-CM | POA: Diagnosis not present

## 2020-03-02 DIAGNOSIS — I700 Atherosclerosis of aorta: Secondary | ICD-10-CM

## 2020-03-02 DIAGNOSIS — Z6836 Body mass index (BMI) 36.0-36.9, adult: Secondary | ICD-10-CM | POA: Diagnosis not present

## 2020-03-02 DIAGNOSIS — E876 Hypokalemia: Secondary | ICD-10-CM | POA: Diagnosis not present

## 2020-03-02 NOTE — Assessment & Plan Note (Addendum)
We discussed benefits of wt loss as well as adverse effects of obesity. Consistency with healthy diet and physical activity recommended. We discussed the importance of controlling/measuring portions, counting calories, and keeping with a food diary.

## 2020-03-02 NOTE — Progress Notes (Signed)
Chief Complaint  Patient presents with  . mri results   HPI: Veronica [name] is a 70 y.o. female, who is here today to discuss a few concerns.  She was seen on 01/19/2020 because of dizziness and visual changes, she was also having frequent headaches; so brain MRI was done. She would like to go through results.  Brain MRI on 01/19/2020: There is no significant signal abnormality in the brain parenchyma, no acute or chronic infarcts, normal flow signal void in both internal carotid arteries and the basilar artery.  There is no hemorrhage, mass lesion, or hydrocephalus.  Headache and neck pain resolved after she discontinued duloxetine. Duloxetine was recommended to manage anxiety and chronic back pain. Currently she is on alprazolam 0.25 mg daily at bedtime. Anxiety has not been as bad.  She is now driving to work instead walking, which has helped with back pain.   She is also concerned about not being able to lose weight. She does not think she eats a lot.  She is not exercising regularly. For breakfast she eats oatmeal with almond milk. Green smoothie: Celery, aloe vera, pineapple, green apple, parsley, and apple cider vinegar. For lunch she may have rice and beans.  She does not measure portions.  She usually does not eat dinner, she is papaya, about 2 cups, and yogurt.  In the past she tried phentermine, did not tolerate it well. She has also been evaluated at [facility], she could not afford co-pay.  HTN: She is not checking BP at home. Negative for chest pain, dyspnea, palpitation, claudication, focal weakness, or edema.  Lab Results  Component Value Date   CREATININE 0.58 10/26/2019   BUN 8 10/26/2019   NA 136 10/26/2019   K 3.4 (L) 10/26/2019   CL 103 10/26/2019   CO2 26 10/26/2019    Lab Results  Component Value Date   HGBA1C 5.5 09/22/2019   Mild aortic calcifications seen on coronary calcium score CT in 12/2018. She is on  pravastatin 80 mg daily and Aspirin 81 mg daily. Lab Results  Component Value Date   CHOL 182 09/22/2019   HDL 55 09/22/2019   LDLCALC 105 (H) 09/22/2019   LDLDIRECT 161.6 06/03/2012   TRIG 123 09/22/2019   CHOLHDL 3.8 08/28/2019   Review of Systems  Constitutional: Positive for fatigue. Negative for activity change, appetite change and fever.  HENT: Negative for mouth sores, nosebleeds and sore throat.   Eyes: Negative for redness and visual disturbance.  Respiratory: Negative for cough and wheezing.   Gastrointestinal: Negative for abdominal pain, nausea and vomiting.       Negative for changes in bowel habits.  Genitourinary: Negative for decreased urine volume and hematuria.  Musculoskeletal: Positive for back pain. Negative for gait problem.  Neurological: Negative for syncope, weakness and headaches.  Psychiatric/Behavioral: Positive for sleep disturbance. The patient is nervous/anxious.   Rest see pertinent positives and negatives per HPI.  Current Outpatient Medications on File Prior to Visit  Medication Sig Dispense Refill  . ALPRAZolam (XANAX) 0.25 MG tablet Take 0.25 mg by mouth at bedtime as needed.    Marland Kitchen amLODipine (NORVASC) 5 MG tablet TAKE 1 TABLET(5 MG) BY MOUTH EVERY MORNING 90 tablet 1  . ASPIRIN 81 PO Take by mouth daily.    . cyanocobalamin 1000 MCG tablet Take 1,000 mcg by mouth daily.    Marland Kitchen losartan (COZAAR) 25 MG tablet TAKE 2 TABLETS BY MOUTH EVERY DAY 90 tablet  1  . MEGARED OMEGA-3 KRILL OIL PO Take by mouth.    . pravastatin (PRAVACHOL) 80 MG tablet Take 1 tablet (80 mg total) by mouth daily. 90 tablet 3  . Vitamin D, Ergocalciferol, (DRISDOL) 1.25 MG (50000 UNIT) CAPS capsule Take 1 capsule (50,000 Units total) by mouth every 7 (seven) days. 4 capsule 0  . meclizine (ANTIVERT) 25 MG tablet Take 1 tablet (25 mg total) by mouth 3 (three) times daily as needed for dizziness. (Patient not taking: No sig reported) 30 tablet 0   No current facility-administered  medications on file prior to visit.    Past Medical History:  Diagnosis Date  . Allergic rhinitis   . Anxiety and depression   . Constipation, chronic   . Coronary artery calcification seen on CAT scan 06/2010   Mild nonobstructive CAD on cardiac CT  -as of 2021, coronary calcium score 34  . Hemorrhoids   . Hip pain, bilateral   . Hyperlipidemia   . Hypertension   . Leukoplakia of vagina   . Low back pain    w/ left side radiculopathy, s/p injection at L4-L5 forarmen 1/06  . Uterine fibroid    No Known Allergies  Social History   Socioeconomic History  . Marital status: Divorced    Spouse name: Not on file  . Number of children: 3  . Years of education: Not on file  . Highest education level: Not on file  Occupational History  . 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 Smoking status: Never Smoker  . Smokeless tobacco: Never Used  Substance and Sexual Activity  . Alcohol use: Yes    Comment: rarely  . Drug use: No  . Sexual activity: Never    Birth control/protection: Surgical  Other Topics Concern  . 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   Social Determinants of Health   Financial Resource Strain: Not on file  Food Insecurity: Not on file  Transportation Needs: Not on file  Physical Activity: Not on file  Stress: Not on file  Social Connections: Not on file    Vitals:   03/02/20 0922  BP: 120/74  Pulse: 77  Resp: 16  Temp: 98.5 F (36.9 C)  SpO2: 98%   Wt Readings from Last 3 Encounters:  03/02/20 219 lb (99.3 kg)  02/22/20 215 lb (97.5 kg)  01/13/20 214 lb 3.2 oz (97.2 kg)   Body mass index is 36.44 kg/m.  Physical Exam Vitals and nursing note reviewed.  Constitutional:      General: She is not in acute distress.    Appearance: She  is well-developed.  HENT:     Head: Normocephalic and atraumatic.     Mouth/Throat:     Mouth: Oropharynx is clear and moist and mucous membranes are normal. Mucous membranes are moist.     Pharynx: Oropharynx is clear.  Eyes:     Conjunctiva/sclera: Conjunctivae normal.  Cardiovascular:     Rate and Rhythm: Normal rate and regular rhythm.     Pulses:          Dorsalis pedis pulses are 2+ on the right side and 2+ on the left side.     Heart sounds: No murmur heard.   Pulmonary:  Effort: Pulmonary effort is normal. No respiratory distress.     Breath sounds: Normal breath sounds.  Abdominal:     Palpations: Abdomen is soft. There is no hepatomegaly or mass.     Tenderness: There is no abdominal tenderness.  Musculoskeletal:        General: No edema.  Lymphadenopathy:     Cervical: No cervical adenopathy.  Skin:    General: Skin is warm.     Findings: No erythema or rash.  Neurological:     General: No focal deficit present.     Mental Status: She is alert and oriented to person, place, and time.     Cranial Nerves: No cranial nerve deficit.     Gait: Gait normal.     Deep Tendon Reflexes: Strength normal.  Psychiatric:        Mood and Affect: Mood is not depressed.     Comments: Well groomed, good eye contact.   ASSESSMENT AND PLAN:  Veronica [name] was seen today for mri results.  Diagnoses and all orders for this visit:  Hypokalemia Mild. Potassium rich diet recommended for now. We can check BMP with next blood work.  Essential hypertension BP adequately controlled. Continue amlodipine 5 mg daily and losartan 50 mg daily. Continue monitoring BP regularly. Low-salt diet.  Anxiety disorder It seems to be stable. She preferred not to start any new medication today. Continue alprazolam 0.25 mg daily as needed.   Aortic atherosclerosis (HCC) Continue pravastatin 80 mg daily and Aspirin 81 mg daily.  Class 2 severe obesity due to excess calories with serious  comorbidity and body mass index (BMI) of 36.0 to 36.9 in adult [facility]) We discussed benefits of wt loss as well as adverse effects of obesity. Consistency with healthy diet and physical activity recommended. We discussed the importance of controlling/measuring portions, counting calories, and keeping with a food diary.  Spent 40 minutes.  During this time history was obtained and documented, examination was performed, prior labs/imaging reviewed, and assessment/plan discussed.  Return in about 4 months (around 06/30/2020).   Gorje Iyer G. Martinique, MD  [facility]. [place] office.  A few things to remember from today's visit:   Hypokalemia  Aortic atherosclerosis ([place]), Chronic  Class 2 severe obesity due to excess calories with serious comorbidity and body mass index (BMI) of 36.0 to 36.9 in adult [facility]), Chronic  Essential hypertension  Insomnia secondary to anxiety  If you need refills please call your pharmacy. Do not use My Chart to request refills or for acute issues that need immediate attention.   Low impact exercise. Low glycemic index food. Decrease portion.  Please be sure medication list is accurate. If a new problem present, please set up appointment sooner than planned today.

## 2020-03-02 NOTE — Assessment & Plan Note (Signed)
BP adequately controlled. Continue amlodipine 5 mg daily and losartan 50 mg daily. Continue monitoring BP regularly. Low-salt diet.

## 2020-03-02 NOTE — Patient Instructions (Addendum)
A few things to remember from today's visit:   Hypokalemia  Aortic atherosclerosis ([place]), Chronic  Class 2 severe obesity due to excess calories with serious comorbidity and body mass index (BMI) of 36.0 to 36.9 in adult [facility]), Chronic  Essential hypertension  Insomnia secondary to anxiety  If you need refills please call your pharmacy. Do not use My Chart to request refills or for acute issues that need immediate attention.   Low impact exercise. Low glycemic index food. Decrease portion.  Please be sure medication list is accurate. If a new problem present, please set up appointment sooner than planned today.

## 2020-03-02 NOTE — Assessment & Plan Note (Signed)
It seems to be stable. She preferred not to start any new medication today. Continue alprazolam 0.25 mg daily as needed.

## 2020-03-02 NOTE — Assessment & Plan Note (Signed)
Continue pravastatin 80 mg daily and Aspirin 81 mg daily.

## 2020-05-03 ENCOUNTER — Other Ambulatory Visit: Payer: Self-pay | Admitting: Family Medicine

## 2020-05-03 NOTE — Telephone Encounter (Signed)
Pt call and stated she need a refill on  sertraline (ZOLOFT) 25 MG tablet [[id number] sent to [place] ,[place]

## 2020-05-03 NOTE — Telephone Encounter (Signed)
Okay to refill? Looks like pt was weaned off medication.

## 2020-05-06 MED ORDER — SERTRALINE HCL 25 MG PO TABS: 25.0000 mg | ORAL_TABLET | Freq: Every day | ORAL | 0 refills | Status: DC

## 2020-05-06 NOTE — Telephone Encounter (Signed)
I spoke with pt. She is restarting medication; rx sent into [facility] [address] in [place], [place] where pt is currently at.

## 2020-05-06 NOTE — Telephone Encounter (Signed)
Yes, she is not supposed to be on Zoloft, unless she resumed it after last visit. Can you please verify that she requested medication. Thanks, BJ

## 2020-05-10 ENCOUNTER — Other Ambulatory Visit: Payer: Self-pay

## 2020-05-10 ENCOUNTER — Ambulatory Visit: Payer: Medicare PPO | Admitting: Family Medicine

## 2020-05-10 ENCOUNTER — Encounter: Payer: Self-pay | Admitting: Family Medicine

## 2020-05-10 VITALS — BP 120/74 | HR 62 | Temp 98.5°F | Resp 16 | Ht 65.0 in | Wt 219.6 lb

## 2020-05-10 DIAGNOSIS — R079 Chest pain, unspecified: Secondary | ICD-10-CM | POA: Diagnosis not present

## 2020-05-10 DIAGNOSIS — R7989 Other specified abnormal findings of blood chemistry: Secondary | ICD-10-CM | POA: Diagnosis not present

## 2020-05-10 DIAGNOSIS — Z6836 Body mass index (BMI) 36.0-36.9, adult: Secondary | ICD-10-CM

## 2020-05-10 DIAGNOSIS — I10 Essential (primary) hypertension: Secondary | ICD-10-CM

## 2020-05-10 DIAGNOSIS — F411 Generalized anxiety disorder: Secondary | ICD-10-CM

## 2020-05-10 DIAGNOSIS — R0602 Shortness of breath: Secondary | ICD-10-CM

## 2020-05-10 LAB — BASIC METABOLIC PANEL
BUN: 11 mg/dL (ref 6–23)
CO2: 24 mEq/L (ref 19–32)
Calcium: 9.3 mg/dL (ref 8.4–10.5)
Chloride: 104 mEq/L (ref 96–112)
Creatinine, Ser: 0.72 mg/dL (ref 0.40–1.20)
GFR: 86.04 mL/min (ref >60.00)
Glucose, Bld: 85 mg/dL (ref 70–99)
Potassium: 4 mEq/L (ref 3.5–5.1)
Sodium: 137 mEq/L (ref 135–145)

## 2020-05-10 LAB — TSH: TSH: 0.65 u[IU]/mL (ref 0.35–4.50)

## 2020-05-10 LAB — T4, FREE: Free T4: 0.96 ng/dL (ref 0.60–1.60)

## 2020-05-10 MED ORDER — SERTRALINE HCL 50 MG PO TABS: 50.0000 mg | ORAL_TABLET | Freq: Every day | ORAL | 3 refills | Status: DC

## 2020-05-10 MED ORDER — ALPRAZOLAM 0.25 MG PO TABS: 0.2500 mg | ORAL_TABLET | Freq: Every evening | ORAL | 1 refills | Status: DC | PRN

## 2020-05-10 NOTE — Progress Notes (Signed)
Chief Complaint  Patient presents with  . Shortness of Breath   HPI: Veronica [name] is a 70 y.o. female with history of hypertension, chronic back pain, and anxiety here today complaining of SOB "at times."  Problem seems to be exacerbated by feeling hot. She feels like she does not have enough O2, feels weak for a few minutes. Drinking ice water helps. No associated palpitations or diaphoresis. Problem has been going on for years. No symptoms with exertion.  She is not exercising regularly, has not been consistent with following a healthful diet.  She would like referral to pulmonologist. No hx of tobacco use. While growing up she [place].  She was in [place] helping her brother,who has terminal lung cancer. Worsening anxiety  Cannot sleep well, Xanax 0.25 mg helps.  She is on Sertraline 25 mg , she has taken 2 tabs daily for the past 3 days. She is also c/o CP and intermittent and neck pain. Advil helps with pain. Last episode was yesterday, tightness-like sensation, middle of chest, and exacerbated by palpation. Deep breathing seems to help. No associated cough or wheezing.  She has been evaluated by cardiologist because of palpitations and lightheadedness. Cardiac event monitor was done in 10/2019.   Sinus rhythm: Minimum heart rate 55 bpm, maximum 126 bpm with an average of 73 bpm.  Rare (< 1%) isolated PACs and PVCs with the very rare PAC couplets.  1 PAC quadruplet-officially short burst of PAT-rate 148 bpm with an average rate of 124 bpm.  No sustained arrhythmias or significant bradycardia.   Stress test in 10/2010 for SOB and fatigue: LVEF 67% and negative for ischemia. Echo in 09/2019 LVEF 65 to 70%.  01/02/2019 Coronary calcium score of 34.  68th percentile for age and sex matched controls.  HTN on Losartan 25 mg daily and amlodipine 5 mg daily. Negative for unusual/frequent headache, visual changes, neurologic focal deficit, or  edema.  Lab Results  Component Value Date   CREATININE 0.58 10/26/2019   BUN 8 10/26/2019   NA 136 10/26/2019   K 3.4 (L) 10/26/2019   CL 103 10/26/2019   CO2 26 10/26/2019   Mildly abnormal TSH in 08/2019. She has not noted abnormal weight loss. No known history of thyroid disease.  Lab Results  Component Value Date   TSH 0.360 (L) 09/22/2019   Review of Systems  Constitutional: Positive for fatigue. Negative for activity change, appetite change and fever.  HENT: Negative for mouth sores, nosebleeds and sore throat.   Gastrointestinal: Negative for abdominal pain, nausea and vomiting.       Negative for changes in bowel habits.  Endocrine: Negative for cold intolerance.  Genitourinary: Negative for decreased urine volume, difficulty urinating, dysuria and hematuria.  Skin: Negative for pallor and rash.  Neurological: Negative for syncope, facial asymmetry and weakness.  Psychiatric/Behavioral: Positive for sleep disturbance. Negative for confusion. The patient is nervous/anxious.   Rest see pertinent positives and negatives per HPI.  Current Outpatient Medications on File Prior to Visit  Medication Sig Dispense Refill  . amLODipine (NORVASC) 5 MG tablet TAKE 1 TABLET(5 MG) BY MOUTH EVERY MORNING 90 tablet 1  . ASPIRIN 81 PO Take by mouth daily.    . cyanocobalamin 1000 MCG tablet Take 1,000 mcg by mouth daily.    Marland Kitchen losartan (COZAAR) 25 MG tablet TAKE 2 TABLETS BY MOUTH EVERY DAY 90 tablet 1  . MEGARED OMEGA-3 KRILL OIL PO Take by mouth.    Marland Kitchen  pravastatin (PRAVACHOL) 80 MG tablet Take 1 tablet (80 mg total) by mouth daily. 90 tablet 3  . Vitamin D, Ergocalciferol, (DRISDOL) 1.25 MG (50000 UNIT) CAPS capsule Take 1 capsule (50,000 Units total) by mouth every 7 (seven) days. 4 capsule 0   No current facility-administered medications on file prior to visit.   Past Medical History:  Diagnosis Date  . Allergic rhinitis   . Anxiety and depression   . Constipation, chronic   .  Coronary artery calcification seen on CAT scan 06/2010   Mild nonobstructive CAD on cardiac CT  -as of 2021, coronary calcium score 34  . Hemorrhoids   . Hip pain, bilateral   . Hyperlipidemia   . Hypertension   . Leukoplakia of vagina   . Low back pain    w/ left side radiculopathy, s/p injection at L4-L5 forarmen 1/06  . Uterine fibroid    No Known Allergies  Social History   Socioeconomic History  . Marital status: Divorced    Spouse name: Not on file  . Number of children: 3  . Years of education: Not on file  . Highest education level: Not on file  Occupational History  . 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 Smoking status: Never Smoker  . Smokeless tobacco: Never Used  Substance and Sexual Activity  . Alcohol use: Yes    Comment: rarely  . Drug use: No  . Sexual activity: Never    Birth control/protection: Surgical  Other Topics Concern  . 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   Social Determinants of Health   Financial Resource Strain: Not on file  Food Insecurity: Not on file  Transportation Needs: Not on file  Physical Activity: Not on file  Stress: Not on file  Social Connections: Not on file   Vitals:   05/10/20 0718  BP: 120/74  Pulse: 62  Resp: 16  Temp: 98.5 F (36.9 C)  SpO2: 98%   Wt Readings from Last 3 Encounters:  05/10/20 219 lb 9.6 oz (99.6 kg)  03/02/20 219 lb (99.3 kg)  02/22/20 215 lb (97.5 kg)   Body mass index is 36.54 kg/m.  Physical Exam Vitals and nursing note reviewed.  Constitutional:      General: She is not in acute distress.    Appearance: She is well-developed.  HENT:     Head: Normocephalic and atraumatic.     Mouth/Throat:     Mouth: Mucous membranes are dry.     Pharynx: Oropharynx is clear.   Eyes:     Conjunctiva/sclera: Conjunctivae normal.  Cardiovascular:     Rate and Rhythm: Normal rate and regular rhythm.     Pulses:          Dorsalis pedis pulses are 2+ on the right side and 2+ on the left side.     Heart sounds: No murmur heard.   Pulmonary:     Effort: Pulmonary effort is normal. No respiratory distress.     Breath sounds: Normal breath sounds.  Chest:     Chest wall: Tenderness present. No deformity.    Abdominal:     Palpations: Abdomen is soft. There is no hepatomegaly or mass.     Tenderness: There is no abdominal tenderness.  Lymphadenopathy:     Cervical: No cervical adenopathy.  Skin:    General: Skin is warm.     Findings: No erythema or rash.  Neurological:     General: No focal deficit present.     Mental Status: She is alert and oriented to person, place, and time.     Cranial Nerves: No cranial nerve deficit.     Gait: Gait normal.  Psychiatric:        Mood and Affect: Mood is anxious.     Comments: Well groomed, good eye contact.   ASSESSMENT AND PLAN:  [name] was seen today for shortness of breath.  Diagnoses and all orders for this visit: Orders Placed This Encounter  Procedures  . DG Chest 2 View  . Basic metabolic panel  . T4, free  . TSH  . Ambulatory referral to Pulmonology  . EKG 12-Lead   Lab Results  Component Value Date   TSH 0.65 05/10/2020   Lab Results  Component Value Date   CREATININE 0.72 05/10/2020   BUN 11 05/10/2020   NA 137 05/10/2020   K 4.0 05/10/2020   CL 104 05/10/2020   CO2 24 05/10/2020   SOB (shortness of breath) This seems to be a chronic problem. We discussed possible etiologies,?  Deconditioning, anxiety. She really would like to see a pulmonologist, she is very concerned about possible lung disease. Chest x-ray ordered today, technician is negative today, so she will be back to have it done.  Chest pain, unspecified type We discussed possible etiologies. History and examination  today do not suggest a serious process. ?  Musculoskeletal pain. EKG today:SR,LAD,normal intervals, and no ST-T charges.  Compared with EKG on 10/28/2019 no significant changes. Clearly instructed about warning signs.  Abnormal TSH Mild. Further recommendations according to TSH result.  Essential hypertension BP adequately controlled. Continue current management.  Anxiety disorder Problem is getting worse. Xanax 0.25 mg daily at bedtime as needed. Sertraline dose increased from 25 mg to 50 mg. Explained that we need to wait a few weeks to determine if Sertraline needs to be adjusted. CBT recommended.  Class 2 severe obesity due to excess calories with serious comorbidity and body mass index (BMI) of 36.0 to 36.9 in adult [facility]) Wt has been stable. She understands the benefits of wt loss as well as adverse effects of obesity. Consistency with healthy diet and physical activity recommended. Weight loss will definitely help with some of her chronic medical problems.   Spent 43 minutes.  During this time history was obtained and documented, examination was performed, prior labs/imaging reviewed, and assessment/plan discussed. Called pt around 1:22 pm to inform her about lab results.She feels reassure.  Return in about 6 weeks (around 06/21/2020).   Kolby Myung G. Martinique, MD  [facility]. [place] office.  A few things to remember from today's visit:   If you need refills please call your pharmacy. Do not use My Chart to request refills or for acute issues that need immediate attention. Referral to pulmonologist placed as requested. Symptoms could be explained by anxiety. Sertraline increased to 50 mg. Continue xanax as needed at bedtime. Please arrange appt with psychotherapist.  I will see you back in 6 weeks, before if needed.  Please be sure medication list is accurate. If a new problem present, please set up appointment sooner than planned today.

## 2020-05-10 NOTE — Assessment & Plan Note (Signed)
Problem is getting worse. Xanax 0.25 mg daily at bedtime as needed. Sertraline dose increased from 25 mg to 50 mg. Explained that we need to wait a few weeks to determine if Sertraline needs to be adjusted. CBT recommended.

## 2020-05-10 NOTE — Assessment & Plan Note (Signed)
BP adequately controlled. Continue current management. 

## 2020-05-10 NOTE — Patient Instructions (Addendum)
A few things to remember from today's visit:   If you need refills please call your pharmacy. Do not use My Chart to request refills or for acute issues that need immediate attention. Referral to pulmonologist placed as requested. Symptoms could be explained by anxiety. Sertraline increased to 50 mg. Continue xanax as needed at bedtime. Please arrange appt with psychotherapist.  I will see you back in 6 weeks, before if needed.  Please be sure medication list is accurate. If a new problem present, please set up appointment sooner than planned today.

## 2020-05-10 NOTE — Assessment & Plan Note (Signed)
Wt has been stable. She understands the benefits of wt loss as well as adverse effects of obesity. Consistency with healthy diet and physical activity recommended. Weight loss will definitely help with some of her chronic medical problems.

## 2020-05-30 ENCOUNTER — Other Ambulatory Visit: Payer: Self-pay

## 2020-05-30 ENCOUNTER — Ambulatory Visit: Payer: Medicare PPO | Admitting: Pulmonary Disease

## 2020-05-30 ENCOUNTER — Encounter: Payer: Self-pay | Admitting: Pulmonary Disease

## 2020-05-30 VITALS — BP 126/70 | HR 66 | Temp 98.4°F | Ht 65.0 in | Wt 221.8 lb

## 2020-05-30 DIAGNOSIS — R0600 Dyspnea, unspecified: Secondary | ICD-10-CM | POA: Diagnosis not present

## 2020-05-30 DIAGNOSIS — R0609 Other forms of dyspnea: Secondary | ICD-10-CM

## 2020-05-30 DIAGNOSIS — J4530 Mild persistent asthma, uncomplicated: Secondary | ICD-10-CM

## 2020-05-30 MED ORDER — ALBUTEROL SULFATE HFA 108 (90 BASE) MCG/ACT IN AERS: 2.0000 | INHALATION_SPRAY | Freq: Four times a day (QID) | RESPIRATORY_TRACT | 11 refills | Status: DC | PRN

## 2020-05-30 MED ORDER — BREO ELLIPTA 200-25 MCG/INH IN AEPB: 1.0000 | INHALATION_SPRAY | Freq: Every day | RESPIRATORY_TRACT | 11 refills | Status: DC

## 2020-05-30 NOTE — Patient Instructions (Signed)
Nice to meet you  Use Breo 1 puff daily.  Rinse her mouth out after every use.  In addition, use albuterol 2 puffs every 6 hours as needed for wheezing or shortness of breath.  I am hopeful of these medications will help improve your symptoms.  If things are not getting better at next visit, we will need to consider getting pulmonary function tests, lab work to see if he would benefit from additional medicines for your asthma.  Return to clinic in 3 months or sooner as needed with Dr. Silas Flood

## 2020-05-30 NOTE — Progress Notes (Signed)
_0  ID: Veronica [name], female    DOB: 09-18-1950, 70 y.o.   MRN: [id number]  Chief Complaint  Patient presents with  . Consult    Referred by PCP for SOB. States she was diagnosed with asthma about 20 years ago. Has a brother who just passed away from lung cancer. Does have an occasional cough. Has a history of thyroid nodules.     Referring provider: Martinique, Betty G, MD  HPI:   70 year old whom we are seeing for evaluation of dyspnea on exertion and shortness of breath.  Most recent PCP note reviewed.  Patient states he was diagnosed with asthma when she lived in [place] many years ago.  This is often exacerbated by the smog, wildfires.  She has bad seasonal allergies and was treated for a long time with allergy shots.  She did use inhaler at the time but states it was only used as needed.  She thinks it is albuterol after being question.  She moved to [place] and lived there for a while and had similar but more mild symptoms.  Continue to receive allergy shots.  Supple he moved to [place] many years ago and since then has had similar symptoms but even milder still.  No longer receiving allergy shots.  Breathing gets worse in the spring with emergence of a lot of pollen.  No timing during the day when things are better or worse.  No environmental changes to account for her symptoms.  No positional changes with things are better or worse.  She has not been using inhalers in some time.  No other alleviating or exacerbating factors.  Symptoms described as moderate to severe.  Most recent chest imaging chest x-ray in 2019 was personally reviewed and interpreted as clear lungs with mild hyperinflation with flattening of the diaphragms medially as well as increased airspace lucency between anterior chest wall and heart.  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  MMRC: mMRC Dyspnea Scale mMRC Score  05/30/2020 2    Epworth:  No flowsheet data found.  Tests:   FENO:  No results found for: NITRICOXIDE  PFT: No flowsheet data found.  WALK:  No flowsheet data found.  Imaging: Personally reviewed and as per EMR and discussion in this note  Lab Results: Personally reviewed, most recent eosinophil count of 200 CBC    Component Value Date/Time   WBC 8.5 10/26/2019 2107   RBC 4.34 10/26/2019 2107   HGB 13.1 10/26/2019 2107   HGB 13.2 09/22/2019 0820   HCT 38.2 10/26/2019 2107   HCT 41.7 09/22/2019 0820   PLT 238 10/26/2019 2107   PLT 254 09/22/2019 0820   MCV 88.0 10/26/2019 2107   MCV 93 09/22/2019 0820   MCH 30.2 10/26/2019 2107   MCHC 34.3 10/26/2019 2107   RDW 13.4 10/26/2019 2107   RDW 13.6 09/22/2019 0820   LYMPHSABS 2.2 09/22/2019 0820   MONOABS 0.3 12/21/2015 0849   EOSABS 0.2 09/22/2019 0820   BASOSABS 0.1 09/22/2019 0820    BMET    Component Value Date/Time   NA 137 05/10/2020 0804   NA 138 09/22/2019 0820   K 4.0 05/10/2020 0804   CL 104 05/10/2020 0804   CO2 24  05/10/2020 0804   GLUCOSE 85 05/10/2020 0804   BUN 11 05/10/2020 0804   BUN 8 09/22/2019 0820   CREATININE 0.72 05/10/2020 0804   CREATININE 0.78 08/28/2019 1048   CALCIUM 9.3 05/10/2020 0804   GFRNONAA >60 10/26/2019 2107   GFRNONAA 83 10/24/2014 1654   GFRAA >60 10/26/2019 2107   GFRAA >89 10/24/2014 1654    BNP No results found for: BNP  ProBNP No results found for: PROBNP  Specialty Problems      Pulmonary Problems   Allergic rhinitis    Qualifier: Diagnosis of  By: End MD, Harrell Gave           No Known Allergies  Immunization History  Administered Date(s) Administered  . Hepatitis A, Adult  10/26/2015  . Influenza Whole 10/07/2009  . MMR 10/26/2015  . PFIZER Comirnaty(Gray Top)Covid-19 Tri-Sucrose Vaccine 02/10/2020  . PFIZER(Purple Top)SARS-COV-2 Vaccination 04/10/2019, 05/05/2019, 08/30/2019  . Pneumococcal Conjugate-13 01/06/2018  . Pneumococcal Polysaccharide-23 08/28/2019  . Tdap 10/24/2014  . Zoster 09/01/2012    Past Medical History:  Diagnosis Date  . Allergic rhinitis   . Anxiety and depression   . Constipation, chronic   . Coronary artery calcification seen on CAT scan 06/2010   Mild nonobstructive CAD on cardiac CT  -as of 2021, coronary calcium score 34  . Hemorrhoids   . Hip pain, bilateral   . Hyperlipidemia   . Hypertension   . Leukoplakia of vagina   . Low back pain    w/ left side radiculopathy, s/p injection at L4-L5 for[facility] 1/06  . Uterine fibroid     Tobacco History: Social History   Tobacco Use  Smoking Status Never Smoker  Smokeless Tobacco Never Used   Counseling given: Not Answered   Continue to not smoke  Outpatient Encounter Medications as of 05/30/2020  Medication Sig  . albuterol (VENTOLIN HFA) 108 (90 Base) MCG/ACT inhaler Inhale 2 puffs into the lungs every 6 (six) hours as needed for wheezing or shortness of breath.  . ALPRAZolam (XANAX) 0.25 MG tablet Take 1 tablet (0.25 mg total) by mouth at bedtime as needed for anxiety or sleep.  Marland Kitchen amLODipine (NORVASC) 5 MG tablet TAKE 1 TABLET(5 MG) BY MOUTH EVERY MORNING  . ASPIRIN 81 PO Take by mouth daily.  . cyanocobalamin 1000 MCG tablet Take 1,000 mcg by mouth daily.  . fluticasone furoate-vilanterol (BREO ELLIPTA) 200-25 MCG/INH AEPB Inhale 1 puff into the lungs daily.  Marland Kitchen losartan (COZAAR) 25 MG tablet TAKE 2 TABLETS BY MOUTH EVERY DAY  . MEGARED OMEGA-3 KRILL OIL PO Take by mouth.  . pravastatin (PRAVACHOL) 80 MG tablet Take 1 tablet (80 mg total) by mouth daily.  . sertraline (ZOLOFT) 50 MG tablet Take 1 tablet (50 mg total) by mouth daily.  . Vitamin D, Ergocalciferol,  (DRISDOL) 1.25 MG (50000 UNIT) CAPS capsule Take 1 capsule (50,000 Units total) by mouth every 7 (seven) days.   No facility-administered encounter medications on file as of 05/30/2020.     Review of Systems  Review of Systems  No chest pain with exertion.  No orthopnea or PND.  Comprehensive review of systems otherwise negative.  Physical Exam  BP 126/70   Pulse 66   Temp 98.4 F (36.9 C) (Temporal)   Ht 5' 5" (1.651 m)   Wt 221 lb 12.8 oz (100.6 kg)   SpO2 97% Comment: on RA  BMI 36.91 kg/m   Wt Readings from Last 5 Encounters:  05/30/20 221 lb 12.8 oz (100.6 kg)  05/10/20 219 lb 9.6 oz (99.6 kg)  03/02/20 219 lb (99.3 kg)  02/22/20 215 lb (97.5 kg)  01/13/20 214 lb 3.2 oz (97.2 kg)    BMI Readings from Last 5 Encounters:  05/30/20 36.91 kg/m  05/10/20 36.54 kg/m  03/02/20 36.44 kg/m  02/22/20 35.78 kg/m  01/13/20 35.64 kg/m     Physical Exam General: Well-appearing, in no acute distress Eyes: EOMI, no icterus Neck: Supple no JVP Cardiovascular: Regular rate and rhythm, no murmur Pulmonary: Clear to auscultation bilaterally, no wheeze, normal work of breathing Abdomen: Nondistended, bowel sounds present MSK: No synovitis, no joint effusion Neuro: Normal gait, no weakness Psych: Normal, full affect   Assessment & Plan:   Dyspnea on exertion: Similar but more severe while in [place] and attributed to allergies, asthma.  Improved but present when lived in [place].  Symptoms not severe currently in [place] but worsened with pollen.  Suspect related to asthma.  See below.  Asthma: Diagnosed in the past.  Atopic symptoms, at times severe seasonal allergies requiring allergy shots.  Suspect current symptoms related to this.  Start high-dose Breo for ICS/LABA therapy as well as as needed albuterol.  Consider phenotyping and PFTs in the future if not responding to therapy.   Return in about 3 months (around 08/30/2020).   Lanier Clam,  MD 05/30/2020

## 2020-06-08 ENCOUNTER — Other Ambulatory Visit: Payer: Self-pay

## 2020-06-08 MED ORDER — PRAVASTATIN SODIUM 80 MG PO TABS
80.0000 mg | ORAL_TABLET | Freq: Every day | ORAL | 3 refills | Status: DC
Start: 2020-06-08 — End: 2021-02-28

## 2020-07-27 ENCOUNTER — Other Ambulatory Visit: Payer: Self-pay

## 2020-07-28 ENCOUNTER — Encounter: Payer: Self-pay | Admitting: Family Medicine

## 2020-07-28 ENCOUNTER — Other Ambulatory Visit: Payer: Self-pay | Admitting: Family Medicine

## 2020-07-28 ENCOUNTER — Ambulatory Visit: Payer: Medicare PPO | Admitting: Family Medicine

## 2020-07-28 VITALS — BP 130/80 | HR 79 | Temp 98.5°F | Resp 16 | Ht 65.0 in | Wt 222.5 lb

## 2020-07-28 DIAGNOSIS — R059 Cough, unspecified: Secondary | ICD-10-CM

## 2020-07-28 DIAGNOSIS — J069 Acute upper respiratory infection, unspecified: Secondary | ICD-10-CM | POA: Diagnosis not present

## 2020-07-28 MED ORDER — FLUTICASONE PROPIONATE 50 MCG/ACT NA SUSP: 1.0000 | Freq: Two times a day (BID) | NASAL | 0 refills | Status: DC

## 2020-07-28 MED ORDER — BENZONATATE 100 MG PO CAPS: 200.0000 mg | ORAL_CAPSULE | Freq: Two times a day (BID) | ORAL | 0 refills | Status: AC | PRN

## 2020-07-28 NOTE — Patient Instructions (Addendum)
A few things to remember from today's visit:  URI, acute  Cough - Plan: benzonatate (TESSALON) 100 MG capsule  If you need refills please call your pharmacy. Do not use My Chart to request refills or for acute issues that need immediate attention.  Viral infections are self-limited and we treat each symptom depending of severity.  Over the counter medications as decongestants and cold medications usually help, they need to be taken with caution if there is a history of high blood pressure or palpitations. Tylenol also helps with most symptoms (headache, muscle aching, fever,etc) Plenty of fluids. Honey helps with cough. Steam inhalations helps with runny nose, nasal congestion, and may prevent sinus infections. Cough and nasal congestion could last a few days and sometimes weeks. Please follow in not any better in 1-2 weeks or if symptoms get worse.  For now contact precautions until COVID 19 test result is back. Monitor for worsening symptoms.  Please be sure medication list is accurate. If a new problem present, please set up appointment sooner than planned today.

## 2020-07-28 NOTE — Progress Notes (Signed)
Chief Complaint  Patient presents with   Cough    Congestion, started on Sunday   HPI: Veronica [name] is a 70 y.o. female with hx of anxiety,HTN, chronic back pain,and seasonal allergies here today complaining of 4 days of respiratory symptoms.  Subjective fever for 2 days, chills,fatigue,frontal pressure headache (mild),nasal congestion,rhinorrhea,sore throat, nonproductive cough,and body aches. Her son was sick recently . + Post nasal drainage. No anosmia or ageusia.  Negative for CP,SOB,wheezing, abdominal pain,nausea,vomiting,or changes in bowel habits. She is feeling better.  She has taken OTC cough medication and Tylenol.  Covid 19 vaccination completed with booster x 1.  Review of Systems  Constitutional:  Positive for activity change. Negative for appetite change.  HENT:  Negative for ear pain, mouth sores, nosebleeds, sinus pain and voice change.   Eyes:  Negative for discharge and redness.  Genitourinary:  Negative for decreased urine volume, dysuria and hematuria.  Musculoskeletal:  Positive for myalgias. Negative for gait problem.  Skin:  Negative for pallor and rash.  Allergic/Immunologic: Positive for environmental allergies.  Neurological:  Negative for syncope, facial asymmetry and weakness.  Hematological:  Negative for adenopathy. Does not bruise/bleed easily.  Rest see pertinent positives and negatives per HPI.  Current Outpatient Medications on File Prior to Visit  Medication Sig Dispense Refill   albuterol (VENTOLIN HFA) 108 (90 Base) MCG/ACT inhaler Inhale 2 puffs into the lungs every 6 (six) hours as needed for wheezing or shortness of breath. 1 each 11   ALPRAZolam (XANAX) 0.25 MG tablet Take 1 tablet (0.25 mg total) by mouth at bedtime as needed for anxiety or sleep. 30 tablet 1   amLODipine (NORVASC) 5 MG tablet TAKE 1 TABLET(5 MG) BY MOUTH EVERY MORNING 90 tablet 1   ASPIRIN 81 PO Take by mouth daily.     cyanocobalamin 1000 MCG tablet Take  1,000 mcg by mouth daily.     fluticasone furoate-vilanterol (BREO ELLIPTA) 200-25 MCG/INH AEPB Inhale 1 puff into the lungs daily. 60 each 11   losartan (COZAAR) 25 MG tablet TAKE 2 TABLETS BY MOUTH EVERY DAY 90 tablet 1   MEGARED OMEGA-3 KRILL OIL PO Take by mouth.     pravastatin (PRAVACHOL) 80 MG tablet Take 1 tablet (80 mg total) by mouth daily. 90 tablet 3   sertraline (ZOLOFT) 50 MG tablet Take 1 tablet (50 mg total) by mouth daily. 30 tablet 3   Vitamin D, Ergocalciferol, (DRISDOL) 1.25 MG (50000 UNIT) CAPS capsule Take 1 capsule (50,000 Units total) by mouth every 7 (seven) days. 4 capsule 0   No current facility-administered medications on file prior to visit.   Past Medical History:  Diagnosis Date   Allergic rhinitis    Anxiety and depression    Constipation, chronic    Coronary artery calcification seen on CAT scan 06/2010   Mild nonobstructive CAD on cardiac CT  -as of 2021, coronary calcium score 34   Hemorrhoids    Hip pain, bilateral    Hyperlipidemia    Hypertension    Leukoplakia of vagina    Low back pain    w/ left side radiculopathy, s/p injection at L4-L5 forarmen 1/06   Uterine fibroid    No Known Allergies  Social History   Socioeconomic History   Marital status: Divorced    Spouse name: Not on file   Number of children: 3   Years of education: Not on file   Highest education level: Not on file  Occupational History  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   Social Determinants  of Health   Financial Resource Strain: Not on file  Food Insecurity: Not on file  Transportation Needs: Not on file  Physical Activity: Not on file  Stress: Not on file  Social Connections: Not on file   Vitals:   07/28/20 1031  BP: 130/80  Pulse: 79  Resp: 16  Temp: 98.5 F (36.9 C)  SpO2: 98%   Body mass index is 37.03 kg/m.  Physical Exam Vitals and nursing note reviewed.  Constitutional:      General: She is not in acute distress.    Appearance: She is well-developed. She is not ill-appearing.  HENT:     Head: Normocephalic and atraumatic.     Right Ear: Ear canal and external ear normal. A middle ear effusion is present.     Left Ear: Tympanic membrane, ear canal and external ear normal.     Nose: Rhinorrhea present.     Right Sinus: No maxillary sinus tenderness or frontal sinus tenderness.     Left Sinus: No maxillary sinus tenderness or frontal sinus tenderness.  Eyes:     Conjunctiva/sclera: Conjunctivae normal.  Cardiovascular:     Rate and Rhythm: Normal rate and regular rhythm.     Heart sounds: No murmur heard. Pulmonary:     Effort: Pulmonary effort is normal. No respiratory distress.     Breath sounds: Normal breath sounds. No stridor.  Musculoskeletal:     Cervical back: No edema or erythema. No muscular tenderness.  Lymphadenopathy:     Head:     Right side of head: No submandibular adenopathy.     Left side of head: No submandibular adenopathy.     Cervical: No cervical adenopathy.  Skin:    General: Skin is warm.     Findings: No erythema or rash.  Neurological:     Mental Status: She is alert and oriented to person, place, and time.  Psychiatric:        Speech: Speech normal.     Comments: Well groomed, good eye contact.    ASSESSMENT AND PLAN:  Veronica [name] was seen today for cough.  Diagnoses and all orders for this visit: Orders Placed This Encounter  Procedures   COVID-19, Flu A+B and RSV    URI, acute Symptoms suggests a viral  etiology, explained that COVID 19 is also in the differential Dx. Symptomatic treatment recommended,I do not think abx is needed at this time.  COVID 19 test done today. Reporting that symptoms have improved.  Contact precautions to continue. Rest and plenty of po fluids. Continue Tylenol 500 mg 3-4 times per day prn. Instructed to monitor for signs of complications, including recurrent fever among some, clearly instructed about warning signs.  F/U as needed.  Cough I also explained that cough and nasal congestion can last a few days and sometimes weeks. I do not think imagine is needed today. Adequate  hydration. Instructed about warning signs.  -     benzonatate (TESSALON) 100 MG capsule; Take 2 capsules (200 mg total) by mouth 2 (two) times daily as needed for up to 10 days.  -     fluticasone (FLONASE) 50 MCG/ACT nasal spray; Place 1 spray into both nostrils 2 (two) times daily.   Return if symptoms worsen or fail to improve.   Dominga Mcduffie G. [place], MD  [facility]. [place] office.

## 2020-07-30 LAB — COVID-19, FLU A+B AND RSV
Influenza A, NAA: NOT DETECTED
Influenza B, NAA: NOT DETECTED
RSV, NAA: NOT DETECTED
SARS-CoV-2, NAA: DETECTED — AB

## 2020-08-01 ENCOUNTER — Other Ambulatory Visit: Payer: Self-pay | Admitting: Family Medicine

## 2020-08-01 DIAGNOSIS — F411 Generalized anxiety disorder: Secondary | ICD-10-CM

## 2020-08-02 MED ORDER — SERTRALINE HCL 50 MG PO TABS: 50.0000 mg | ORAL_TABLET | Freq: Every day | ORAL | 3 refills | Status: DC

## 2020-08-03 ENCOUNTER — Other Ambulatory Visit: Payer: Self-pay | Admitting: Family Medicine

## 2020-08-03 DIAGNOSIS — Z1231 Encounter for screening mammogram for malignant neoplasm of breast: Secondary | ICD-10-CM

## 2020-08-08 ENCOUNTER — Telehealth: Payer: Self-pay | Admitting: Family Medicine

## 2020-08-08 NOTE — Telephone Encounter (Signed)
Left message for patient to call back and schedule Medicare Annual Wellness Visit (AWV) either virtually or in office.   WTM 01/06/18 please schedule at anytime with [facility] Nurse Health Advisor 1 or 2   This should be a 45 minute visit.

## 2020-08-10 ENCOUNTER — Ambulatory Visit: Payer: Medicare PPO

## 2020-08-19 ENCOUNTER — Other Ambulatory Visit: Payer: Self-pay | Admitting: Family Medicine

## 2020-08-30 ENCOUNTER — Other Ambulatory Visit: Payer: Self-pay

## 2020-08-30 ENCOUNTER — Ambulatory Visit
Admission: RE | Admit: 2020-08-30 | Discharge: 2020-08-30 | Disposition: A | Discharge status: Home / Self Care | Payer: Medicare PPO | Source: Ambulatory Visit | Attending: Family Medicine | Admitting: Family Medicine

## 2020-08-30 DIAGNOSIS — Z1231 Encounter for screening mammogram for malignant neoplasm of breast: Secondary | ICD-10-CM

## 2020-08-31 ENCOUNTER — Telehealth: Payer: Self-pay | Admitting: Cardiology

## 2020-08-31 NOTE — Telephone Encounter (Signed)
Pt c/o of Chest Pain: STAT if CP now or developed within 24 hours  1. Are you having CP right now?  No   2. Are you experiencing any other symptoms (ex. SOB, nausea, vomiting, sweating)?  Elevated BP  3. How long have you been experiencing CP?  About 1 week  4. Is your CP continuous or coming and going?  Coming and going   5. Have you taken Nitroglycerin?  No, but she states she takes Setraline to help with anxiety. She assumes the chest pain is caused by anxiety.  Pt c/o BP issue: STAT if pt c/o blurred vision, one-sided weakness or slurred speech  1. What are your last 5 BP readings?  No readings available, but her BP has been around 200/100  2. Are you having any other symptoms (ex. Dizziness, headache, blurred vision, passed out)?  Light headache    3. What is your BP issue?   Patient states her BP has been elevated.  ?

## 2020-08-31 NOTE — Telephone Encounter (Signed)
Spoke with pt, she reports sharpe chest pain that comes and goes about 5-6 days ago. The EMS said her heart was fine and that she was having a panic attack. Today her daughter came over and her bp was elevated. She also reports emotional issues at home. She is overdue for yearly appointment, Follow up scheduled

## 2020-09-01 ENCOUNTER — Other Ambulatory Visit: Payer: Self-pay | Admitting: Family Medicine

## 2020-09-08 ENCOUNTER — Ambulatory Visit: Payer: Medicare PPO

## 2020-09-14 ENCOUNTER — Ambulatory Visit (INDEPENDENT_AMBULATORY_CARE_PROVIDER_SITE_OTHER): Payer: Medicare PPO

## 2020-09-14 ENCOUNTER — Other Ambulatory Visit: Payer: Self-pay

## 2020-09-14 DIAGNOSIS — Z0000 Encounter for general adult medical examination without abnormal findings: Secondary | ICD-10-CM | POA: Diagnosis not present

## 2020-09-14 NOTE — Patient Instructions (Signed)
Veronica [name] , Thank you for taking time to come for your Medicare Wellness Visit. I appreciate your ongoing commitment to your health goals. Please review the following plan we discussed and let me know if I can assist you in the future.   Screening recommendations/referrals: Colonoscopy: Done 02/02/14 repeat every 10 years due 02/03/24 Mammogram: Done 08/30/20 repeat every year Bone Density: Done 03/06/18 repeat every 2 years  Recommended yearly ophthalmology/optometry visit for glaucoma screening and checkup Recommended yearly dental visit for hygiene and checkup  Vaccinations: Influenza vaccine: Due Pneumococcal vaccine: Completed  Tdap vaccine: Done 10/24/14 repeat every 10 years due 10/24/14 Shingles vaccine: Shingrix discussed. Please contact your pharmacy for coverage information.    Covid-19:Completed 3/12, 4/6, & 08/30/19  Advanced directives: Advance directive discussed with you today. I have provided a copy for you to complete at home and have notarized. Once this is complete please bring a copy in to our office so we can scan it into your chart.  Conditions/risks identified: Lose weight   Next appointment: Follow up in one year for your annual wellness visit    Preventive Care 65 Years and Older, Female Preventive care refers to lifestyle choices and visits with your health care provider that can promote health and wellness. What does preventive care include? A yearly physical exam. This is also called an annual well check. Dental exams once or twice a year. Routine eye exams. Ask your health care provider how often you should have your eyes checked. Personal lifestyle choices, including: Daily care of your teeth and gums. Regular physical activity. Eating a healthy diet. Avoiding tobacco and drug use. Limiting alcohol use. Practicing safe sex. Taking low-dose aspirin every day. Taking vitamin and mineral supplements as recommended by your health care provider. What happens  during an annual well check? The services and screenings done by your health care provider during your annual well check will depend on your age, overall health, lifestyle risk factors, and family history of disease. Counseling  Your health care provider may ask you questions about your: Alcohol use. Tobacco use. Drug use. Emotional well-being. Home and relationship well-being. Sexual activity. Eating habits. History of falls. Memory and ability to understand (cognition). Work and work Statistician. Reproductive health. Screening  You may have the following tests or measurements: Height, weight, and BMI. Blood pressure. Lipid and cholesterol levels. These may be checked every 5 years, or more frequently if you are over 32 years old. Skin check. Lung cancer screening. You may have this screening every year starting at age 40 if you have a 30-pack-year history of smoking and currently smoke or have quit within the past 15 years. Fecal occult blood test (FOBT) of the stool. You may have this test every year starting at age 63. Flexible sigmoidoscopy or colonoscopy. You may have a sigmoidoscopy every 5 years or a colonoscopy every 10 years starting at age 42. Hepatitis C blood test. Hepatitis B blood test. Sexually transmitted disease (STD) testing. Diabetes screening. This is done by checking your blood sugar (glucose) after you have not eaten for a while (fasting). You may have this done every 1-3 years. Bone density scan. This is done to screen for osteoporosis. You may have this done starting at age 31. Mammogram. This may be done every 1-2 years. Talk to your health care provider about how often you should have regular mammograms. Talk with your health care provider about your test results, treatment options, and if necessary, the need for more tests. Vaccines  Your health care provider may recommend certain vaccines, such as: Influenza vaccine. This is recommended every  year. Tetanus, diphtheria, and acellular pertussis (Tdap, Td) vaccine. You may need a Td booster every 10 years. Zoster vaccine. You may need this after age 56. Pneumococcal 13-valent conjugate (PCV13) vaccine. One dose is recommended after age 83. Pneumococcal polysaccharide (PPSV23) vaccine. One dose is recommended after age 62. Talk to your health care provider about which screenings and vaccines you need and how often you need them. This information is not intended to replace advice given to you by your health care provider. Make sure you discuss any questions you have with your health care provider. Document Released: 02/11/2015 Document Revised: 10/05/2015 Document Reviewed: 11/16/2014 Elsevier Interactive Patient Education  2017 [place] Prevention in the Home Falls can cause injuries. They can happen to people of all ages. There are many things you can do to make your home safe and to help prevent falls. What can I do on the outside of my home? Regularly fix the edges of walkways and driveways and fix any cracks. Remove anything that might make you trip as you walk through a door, such as a raised step or threshold. Trim any bushes or trees on the path to your home. Use bright outdoor lighting. Clear any walking paths of anything that might make someone trip, such as rocks or tools. Regularly check to see if handrails are loose or broken. Make sure that both sides of any steps have handrails. Any raised decks and porches should have guardrails on the edges. Have any leaves, snow, or ice cleared regularly. Use sand or salt on walking paths during winter. Clean up any spills in your garage right away. This includes oil or grease spills. What can I do in the bathroom? Use night lights. Install grab bars by the toilet and in the tub and shower. Do not use towel bars as grab bars. Use non-skid mats or decals in the tub or shower. If you need to sit down in the shower, use a  plastic, non-slip stool. Keep the floor dry. Clean up any water that spills on the floor as soon as it happens. Remove soap buildup in the tub or shower regularly. Attach bath mats securely with double-sided non-slip rug tape. Do not have throw rugs and other things on the floor that can make you trip. What can I do in the bedroom? Use night lights. Make sure that you have a light by your bed that is easy to reach. Do not use any sheets or blankets that are too big for your bed. They should not hang down onto the floor. Have a firm chair that has side arms. You can use this for support while you get dressed. Do not have throw rugs and other things on the floor that can make you trip. What can I do in the kitchen? Clean up any spills right away. Avoid walking on wet floors. Keep items that you use a lot in easy-to-reach places. If you need to reach something above you, use a strong step stool that has a grab bar. Keep electrical cords out of the way. Do not use floor polish or wax that makes floors slippery. If you must use wax, use non-skid floor wax. Do not have throw rugs and other things on the floor that can make you trip. What can I do with my stairs? Do not leave any items on the stairs. Make sure that there are  handrails on both sides of the stairs and use them. Fix handrails that are broken or loose. Make sure that handrails are as long as the stairways. Check any carpeting to make sure that it is firmly attached to the stairs. Fix any carpet that is loose or worn. Avoid having throw rugs at the top or bottom of the stairs. If you do have throw rugs, attach them to the floor with carpet tape. Make sure that you have a light switch at the top of the stairs and the bottom of the stairs. If you do not have them, ask someone to add them for you. What else can I do to help prevent falls? Wear shoes that: Do not have high heels. Have rubber bottoms. Are comfortable and fit you  well. Are closed at the toe. Do not wear sandals. If you use a stepladder: Make sure that it is fully opened. Do not climb a closed stepladder. Make sure that both sides of the stepladder are locked into place. Ask someone to hold it for you, if possible. Clearly mark and make sure that you can see: Any grab bars or handrails. First and last steps. Where the edge of each step is. Use tools that help you move around (mobility aids) if they are needed. These include: Canes. Walkers. Scooters. Crutches. Turn on the lights when you go into a dark area. Replace any light bulbs as soon as they burn out. Set up your furniture so you have a clear path. Avoid moving your furniture around. If any of your floors are uneven, fix them. If there are any pets around you, be aware of where they are. Review your medicines with your doctor. Some medicines can make you feel dizzy. This can increase your chance of falling. Ask your doctor what other things that you can do to help prevent falls. This information is not intended to replace advice given to you by your health care provider. Make sure you discuss any questions you have with your health care provider. Document Released: 11/11/2008 Document Revised: 06/23/2015 Document Reviewed: 02/19/2014 Elsevier Interactive Patient Education  2017 [employer].

## 2020-09-14 NOTE — Progress Notes (Addendum)
Virtual Visit via Telephone Note  I connected with  Veronica [name] on 09/14/20 at 10:15 AM EDT by telephone and verified that I am speaking with the correct person using two identifiers.  Medicare Annual Wellness visit completed telephonically due to Covid-19 pandemic.   Persons participating in this call: This Health Coach and this patient.   Location: Patient: Home Provider: Office   I discussed the limitations, risks, security and privacy concerns of performing an evaluation and management service by telephone and the availability of in person appointments. The patient expressed understanding and agreed to proceed.  Unable to perform video visit due to video visit attempted and failed and/or patient does not have video capability.   Some vital signs may be absent or patient reported.   Willette Brace, LPN   Subjective:   Veronica [name] is a 70 y.o. female who presents for Medicare Annual (Subsequent) preventive examination.  Review of Systems     Cardiac Risk Factors include: advanced age (>86mn, >>16women);hypertension;dyslipidemia;obesity (BMI >30kg/m2)     Objective:    There were no vitals filed for this visit. There is no height or weight on file to calculate BMI.  Advanced Directives 09/14/2020 10/26/2019 04/01/2018 02/02/2014 01/11/2014 12/08/2010 11/30/2010  Does Patient Have a Medical Advance Directive? _0  Patient does not have advance directive;Patient would not like information Patient does not have advance directive;Patient would not like information  Would patient like information on creating a medical advance directive? Yes (MAU/Ambulatory/Procedural Areas - Information given) - No - Patient declined No - patient declined information No - patient declined information - -  Pre-existing out of facility DNR order (yellow form or pink MOST form) - - - - - No -    Current Medications (verified) Outpatient Encounter Medications as of 09/14/2020  Medication  Sig   albuterol (VENTOLIN HFA) 108 (90 Base) MCG/ACT inhaler Inhale 2 puffs into the lungs every 6 (six) hours as needed for wheezing or shortness of breath.   ALPRAZolam (XANAX) 0.25 MG tablet Take 1 tablet (0.25 mg total) by mouth at bedtime as needed for anxiety or sleep.   amLODipine (NORVASC) 5 MG tablet TAKE 1 TABLET(5 MG) BY MOUTH EVERY MORNING   cyanocobalamin 1000 MCG tablet Take 1,000 mcg by mouth daily.   fluticasone (FLONASE) 50 MCG/ACT nasal spray PLACE 1 SPRAY INTO BOTH NOSTRILS 2 (TWO) TIMES DAILY   fluticasone furoate-vilanterol (BREO ELLIPTA) 200-25 MCG/INH AEPB Inhale 1 puff into the lungs daily.   losartan (COZAAR) 25 MG tablet TAKE 2 TABLETS BY MOUTH EVERY DAY   MEGARED OMEGA-3 KRILL OIL PO Take by mouth.   pravastatin (PRAVACHOL) 80 MG tablet Take 1 tablet (80 mg total) by mouth daily.   sertraline (ZOLOFT) 50 MG tablet Take 1 tablet (50 mg total) by mouth daily.   Vitamin D, Ergocalciferol, (DRISDOL) 1.25 MG (50000 UNIT) CAPS capsule Take 1 capsule (50,000 Units total) by mouth every 7 (seven) days.   [DISCONTINUED] ASPIRIN 81 PO Take by mouth daily. (Patient not taking: Reported on 09/14/2020)   No facility-administered encounter medications on file as of 09/14/2020.    Allergies (verified) Patient has no known allergies.   History: Past Medical History:  Diagnosis Date   Allergic rhinitis    Anxiety and depression    Constipation, chronic    Coronary artery calcification seen on CAT scan 06/2010   Mild nonobstructive CAD on cardiac CT  -as of 2021, coronary calcium score 34   Hemorrhoids  Hip pain, bilateral    Hyperlipidemia    Hypertension    Leukoplakia of vagina    Low back pain    w/ left side radiculopathy, s/p injection at L4-L5 forarmen 1/06   Uterine fibroid    Past Surgical History:  Procedure Laterality Date   ABDOMINAL HYSTERECTOMY  11-2000   no oophorectomy per pt   CHOLECYSTECTOMY  12/08/2010   Procedure: LAPAROSCOPIC CHOLECYSTECTOMY WITH  INTRAOPERATIVE CHOLANGIOGRAM;  Surgeon: Pedro Earls, MD;  Location: [facility];  Service: General;  Laterality: N/A;  c-arm    COLONOSCOPY WITH PROPOFOL N/A 02/02/2014   Procedure: COLONOSCOPY WITH PROPOFOL;  Surgeon: Garlan Fair, MD;  Location: [facility];  Service: Endoscopy;  Laterality: N/A;   CORONARY CALCIUM SCORE  12/2018   Coronary calcium score-34   CORONARY CT ANGIOGRAM  06/2010    Coronary calcium score 12.  Mild nonobstructive disease.  Ostial LAD and first D1/RI.  Right dominant.  Focal scarring and bronchiectasis of medial right lower lobe.   LEEP     unknown date    NM MYOVIEW LTD  10/2010   Nonischemic.  Normal EF.   TUBAL LIGATION     bilateral 1989   UTERINE FIBROID SURGERY     Family History  Problem Relation Age of Onset   Schizophrenia Son    Schizophrenia Brother    CVA Brother    Valvular heart disease Brother        Valve Surgery   Schizophrenia Other        2 nieces   Hypertension Mother    Hyperlipidemia Mother    Heart attack Mother 56   Heart disease Mother    Anxiety disorder Mother    Hypertension Father    Hyperlipidemia Father    AAA (abdominal aortic aneurysm) Father 73   Heart disease Father    Heart disease Brother        Vavle surgery   Diabetes Neg Hx    Colon cancer Neg Hx    Breast cancer Neg Hx    Social History   Socioeconomic History   Marital status: Divorced    Spouse name: Not on file   Number of children: 3   Years of education: Not on file   Highest education level: Not on file  Occupational History   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   Social Determinants of Health   Financial Resource Strain: Low Risk    Difficulty of Paying Living Expenses: Not hard at all  Food Insecurity: No Food Insecurity   Worried About Charity fundraiser in the Last Year: Never true   [place] in the Last Year: Never true  Transportation Needs: No Transportation Needs   Lack of Transportation (Medical): No   Lack of Transportation (Non-Medical): No  Physical Activity: Sufficiently Active   Days of Exercise per Week: 5 days   Minutes of Exercise per Session:  30 min  Stress: No Stress Concern Present   Feeling of Stress : Not at all  Social Connections: Moderately Integrated   Frequency of Communication with Friends and Family: More than three times a week   Frequency of Social Gatherings with Friends and Family: More than three times a week   Attends Religious Services: 1 to 4 times per year   Active Member of [employer] or Organizations: Yes   Attends Archivist Meetings: 1 to 4 times per year   Marital Status: Divorced    Tobacco Counseling Counseling given: Not Answered   Clinical Intake:  Pre-visit preparation completed: Yes  Pain : No/denies pain     BMI - recorded: 32.55 Nutritional Status: BMI > 30  Obese Nutritional Risks: None Diabetes: No  How often do you need to have someone help you when you read instructions, pamphlets, or other written materials from your doctor or pharmacy?: 1 - Never  Diabetic?No  Interpreter Needed?: No  Information entered by :: Charlott Rakes, LPN   Activities of Daily Living In your present state of health, do you have any difficulty performing the following activities: 09/14/2020  Hearing? N  Vision? N  Difficulty concentrating or making decisions? Y  Comment at times emotional  Walking or climbing stairs? N  Dressing or bathing? N  Doing errands, shopping? N   Preparing Food and eating ? N  Using the Toilet? N  In the past six months, have you accidently leaked urine? N  Do you have problems with loss of bowel control? N  Managing your Medications? N  Managing your Finances? N  Housekeeping or managing your Housekeeping? N  Some recent data might be hidden    Patient Care Team: [place], Betty G, MD as PCP - General (Family Medicine) Leonie Man, MD as PCP - Cardiology (Cardiology)  Indicate any recent Medical Services you may have received from other than [facility] providers in the past year (date may be approximate).     Assessment:   This is a routine wellness examination for Veronica [name].  Hearing/Vision screen Hearing Screening - Comments:: Denies any hearing issues Vision Screening - Comments:: Pt follows up with dr Alois Cliche for annual eye exams   Dietary issues and exercise activities discussed: Current Exercise Habits: Home exercise routine, Type of exercise: walking, Time (Minutes): 30, Frequency (Times/Week): 5, Weekly Exercise (Minutes/Week): 150   Goals Addressed             This Visit's Progress    Patient Stated       Lose weight        Depression Screen PHQ 2/9 Scores 09/14/2020 09/22/2019 08/30/2019 01/06/2018 10/24/2014  PHQ - 2 Score 0 2 0 0 0  PHQ- 9 Score - 3 - - -    Fall Risk Fall Risk  09/14/2020 08/28/2019 01/06/2018  Falls in the past year? 0 0 0  Number falls in past yr: 0 - 0  Injury with Fall? 0 - 0  Risk for fall due to : Impaired vision - -  Follow up Falls prevention discussed - Education provided    FALL RISK PREVENTION PERTAINING TO THE HOME:  Any stairs in or around the home? No  If so, are there any without handrails? No  Home free of loose throw rugs in walkways, pet beds, electrical cords, etc? Yes  Adequate lighting in your home to reduce risk of falls? Yes   ASSISTIVE DEVICES UTILIZED TO PREVENT FALLS:  Life alert? No  Use of a cane, walker or w/c? No  Grab bars in the bathroom? No   Shower chair or bench in shower? No  Elevated toilet seat or a handicapped toilet? No   TIMED UP AND GO:  Was the test performed? No .  Cognitive Function:     6CIT Screen 09/14/2020  What Year? 0 points  What month? 0 points  What time? 0 points  Count back from 20 0 points  Months in reverse 0 points  Repeat phrase 0 points  Total Score 0    Immunizations Immunization History  Administered Date(s) Administered   Hepatitis A, Adult 10/26/2015   Influenza Whole 10/07/2009   MMR 10/26/2015   PFIZER Comirnaty(Gray Top)Covid-19 Tri-Sucrose Vaccine 02/10/2020   PFIZER(Purple Top)SARS-COV-2 Vaccination 04/10/2019, 05/05/2019, 08/30/2019   Pneumococcal Conjugate-13 01/06/2018   Pneumococcal Polysaccharide-23 08/28/2019   Tdap 10/24/2014   Zoster, Live 09/01/2012    TDAP status: Up to date  Flu Vaccine status: Due, Education has been provided regarding the importance of this vaccine. Advised may receive this vaccine at local pharmacy or Health Dept. Aware to provide a copy of the vaccination record if obtained from local pharmacy or Health Dept. Verbalized acceptance and understanding.  Pneumococcal vaccine status: Up to date  Covid-19 vaccine status: Completed vaccines  Qualifies for Shingles Vaccine? Yes   Zostavax completed Yes   Shingrix Completed?: No.    Education has been provided regarding the importance of this vaccine. Patient has been advised to call insurance company to determine out of pocket expense if they have not yet received this vaccine. Advised may also receive vaccine at local pharmacy or Health Dept. Verbalized acceptance and understanding.  Screening Tests Health Maintenance  Topic Date Due   Zoster Vaccines- Shingrix (1 of 2) Never done   INFLUENZA VACCINE  08/29/2020   MAMMOGRAM  08/30/2021   COLONOSCOPY (Pts 45-35yr Insurance coverage will need to be confirmed)  02/03/2024   TETANUS/TDAP  10/23/2024   DEXA SCAN  Completed   COVID-19 Vaccine   Completed   Hepatitis C Screening  Completed   PNA vac Low Risk Adult  Completed   HPV VACCINES  Aged Out    Health Maintenance  Health Maintenance Due  Topic Date Due   Zoster Vaccines- Shingrix (1 of 2) Never done   INFLUENZA VACCINE  08/29/2020    Colorectal cancer screening: Type of screening: Colonoscopy. Completed 02/02/14. Repeat every 10 years  Mammogram status: Completed 08/30/20. Repeat every year  Bone Density status: Completed 03/06/18. Results reflect: Bone density results: NORMAL. Repeat every 2 years.   Additional Screening:  Hepatitis C Screening:  Completed 12/26/16  Vision Screening: Recommended annual ophthalmology exams for early detection of glaucoma and other disorders of the eye. Is the patient up to date with their annual eye exam?  Yes  Who is the provider or what is the name of the office in which the patient attends annual eye exams? Dr DIdolina PrimerIf pt is not established with a provider, would they like to be referred to a provider to establish care? No .   Dental Screening: Recommended annual dental exams for proper oral hygiene  Community Resource Referral / Chronic Care Management: CRR required this visit?  No   CCM required this visit?  No      Plan:     I have personally reviewed and noted the following in the patient's chart:   Medical and social history Use of alcohol, tobacco or illicit drugs  Current medications  and supplements including opioid prescriptions.  Functional ability and status Nutritional status Physical activity Advanced directives List of other physicians Hospitalizations, surgeries, and ER visits in previous 12 months Vitals Screenings to include cognitive, depression, and falls Referrals and appointments  In addition, I have reviewed and discussed with patient certain preventive protocols, quality metrics, and best practice recommendations. A written personalized care plan for preventive services as well as general  preventive health recommendations were provided to patient.     Willette Brace, LPN   [id number]   Nurse Notes: None

## 2020-10-10 ENCOUNTER — Other Ambulatory Visit: Payer: Self-pay

## 2020-10-10 ENCOUNTER — Encounter: Payer: Self-pay | Admitting: Cardiology

## 2020-10-10 ENCOUNTER — Ambulatory Visit: Payer: Medicare PPO | Admitting: Cardiology

## 2020-10-10 VITALS — BP 138/70 | HR 74 | Ht 65.0 in | Wt 215.0 lb

## 2020-10-10 DIAGNOSIS — R0789 Other chest pain: Secondary | ICD-10-CM | POA: Diagnosis not present

## 2020-10-10 DIAGNOSIS — Z8249 Family history of ischemic heart disease and other diseases of the circulatory system: Secondary | ICD-10-CM

## 2020-10-10 DIAGNOSIS — E785 Hyperlipidemia, unspecified: Secondary | ICD-10-CM | POA: Diagnosis not present

## 2020-10-10 DIAGNOSIS — R002 Palpitations: Secondary | ICD-10-CM

## 2020-10-10 DIAGNOSIS — I700 Atherosclerosis of aorta: Secondary | ICD-10-CM

## 2020-10-10 DIAGNOSIS — E782 Mixed hyperlipidemia: Secondary | ICD-10-CM

## 2020-10-10 DIAGNOSIS — I10 Essential (primary) hypertension: Secondary | ICD-10-CM | POA: Diagnosis not present

## 2020-10-10 MED ORDER — LOSARTAN POTASSIUM 50 MG PO TABS: 50.0000 mg | ORAL_TABLET | Freq: Every day | ORAL | 3 refills | Status: DC

## 2020-10-10 NOTE — Progress Notes (Signed)
Primary Care Provider: Martinique, Betty G, MD Cardiologist: Glenetta Hew, MD Electrophysiologist: None  Clinic Note: Chief Complaint  Patient presents with   Follow-up    Almost a year    ===================================  ASSESSMENT/PLAN   Problem List Items Addressed This Visit       Cardiology Problems   Essential hypertension (Chronic)    Blood pressures little high today.  Plan: Increase losartan to 50 mg and continue amlodipine  5 mg      Relevant Medications   losartan (COZAAR) 50 MG tablet   Aortic atherosclerosis (HCC) (Chronic)    Risk factor modification with blood pressure control and statin; She was started on aspirin by PCP.  Coronary calcium score would indicate that may not be necessary.      Relevant Medications   losartan (COZAAR) 50 MG tablet   Hyperlipidemia with target LDL less than 100 (Chronic)    Lipids showed LDL of 105 2021.  Due for labs rechecked now: Will check CBC A1c and c-Met along with FLP.  Continue current dose of pravastatin.       Relevant Medications   losartan (COZAAR) 50 MG tablet   Other Relevant Orders   CBC (Completed)   Comprehensive metabolic panel (Completed)   Lipid panel (Completed)   TSH (Completed)   Hemoglobin A1c (Completed)     Other   Family history of heart disease (Chronic)    Reassuring coronary calcium score last year.  Continue lipid management trying to reduce LDL to below 100 if not closer to 70.  Continue blood pressure control.  Increasing ARB dose today continuing calcitonin blocker.      Relevant Orders   CBC (Completed)   Comprehensive metabolic panel (Completed)   Lipid panel (Completed)   TSH (Completed)   Hemoglobin A1c (Completed)   Palpitations (Chronic)    Telemedicine visit last year for palpitations.  He said to be well controlled.  She is stable from that standpoint doing well on sertraline.  No beta-blocker      Anterior chest wall pain - Primary    Fleeting episode  of chest pain intermittently over couple days.  Since then no further symptoms.  Not made worse with exertion.  In fact made worse with palpation.  Likely musculoskeletal/costochondritis.  No further chest pain now.  Discussed using Tylenol, intermittent NSAIDs and warm compress       ===================================  HPI:    Veronica [name] is a 70 y.o. female with a PMH notable for history of asthma and family history of CAD (initially referred by Dr. Martinique for Coronary Artery Calcification on CT scan-Coronary Calcium Score 34) who presents today for ~1 year follow-up.  Last time I saw her was in January 2021 in follow-up from her Coronary Calcium Score.  Was doing fairly well.  Tolerating pravastatin.  Blood pressures were stable.  No cardiac symptoms.  Only exertional dyspnea if she rushes upstairs.  No changes made.  Veronica [name] was last seen via telemedicine by Coletta Memos, NP on November 23, 2019 via telemedicine with complaints of palpitations and lightheadedness.  Had been to the hospital back on 29 September with headache and neck pain.  Noted to have some elevated blood pressure.  Palpitations associated with with lightheadedness. => Zio patch monitor ordered.,  Discussed avoiding triggers.  Recent Hospitalizations: None  Seen on May 30, 2020 2 by Dr. Silas Flood - Pulmonary Medicine -> referred for dyspnea.  Apparently has a distant history of asthma.  Brother just recently passed from lung cancer.  Noted that chest x-ray back in 2019 revealed hyperinflation with flattening of the diaphragms. => Given prescription for Breo and considered ordering PFTs if not responding to therapy.  Reviewed  CV studies:    The following studies were reviewed today: (if available, images/films reviewed: From Epic Chart or Care Everywhere) Event Monitor October 2021: Sinus rhythm, rate from 55 to 126 bpm.  Average 73 bpm.  Rare PACs and PVCs with some couplets.  1 PAC quadruplet at a rate of 48  bpm.  Otherwise no sustained arrhythmias-tachycardia or bradycardia.   Interval History:   Veronica [name] presents now for about a 1 year follow-up.  She called in on August 3 with episode of sharp chest pain that comes and goes.  Contacted EMS and was told that she had a panic attack.  BP was little elevated.  Appointment scheduled.  She does note some exertional dyspnea when she overdoes it, but not with routine activity.  She had that recent spell with a several episode of chest discomfort that were somewhat fleeting, sharp right along the costal sternal area.  Worse with deep inspiration.  Otherwise, she is doing pretty well.  She says the palpitations are pretty well controlled after having some adjustments to her medicines for anxiety. (she is actually taking sertraline).    She had an episode where she felt lightheaded and dizzy after eating some cheese.  She felt her heart rate go up and her blood pressure go up associated lightheadedness but this is one isolated episode.  CV Review of Symptoms (Summary) Cardiovascular ROS: positive for - 1 spell of intermittent sharp chest pains, now relieved.  Palpitations well controlled.  Lightheadedness with some shifts in blood pressure and heart rate depending on what she eats.  (Notably cheese) negative for - dyspnea on exertion, edema, orthopnea, paroxysmal nocturnal dyspnea, rapid heart rate, shortness of breath, or syncope/near syncope or TIA/amaurosis fugax, claudication  REVIEWED OF SYSTEMS   Review of Systems  Constitutional:  Positive for malaise/fatigue and weight loss (Trying to lose weight.).  HENT:  Negative for congestion and nosebleeds.   Respiratory:  Negative for cough.   Cardiovascular:        Per HPI  Gastrointestinal:  Negative for blood in stool and melena.  Genitourinary:  Negative for hematuria.  Musculoskeletal:  Negative for joint pain.  Neurological:  Positive for dizziness (Per HPI). Negative for focal weakness,  seizures and weakness.  Psychiatric/Behavioral:  Negative for depression and memory loss. The patient is nervous/anxious (Doing better with sertraline.) and has insomnia (Associate with anxiety).    I have reviewed and (if needed) personally updated the patient's problem list, medications, allergies, past medical and surgical history, social and family history.   PAST MEDICAL HISTORY   Past Medical History:  Diagnosis Date   Allergic rhinitis    Anxiety and depression    Constipation, chronic    Coronary artery calcification seen on CAT scan 06/2010   Mild nonobstructive CAD on cardiac CT  -as of 2021, coronary calcium score 34   Hemorrhoids    Hip pain, bilateral    Hyperlipidemia    Hypertension    Leukoplakia of vagina    Low back pain    w/ left side radiculopathy, s/p injection at L4-L5 forarmen 1/06   Uterine fibroid     PAST SURGICAL HISTORY -full results reviewed.  Pertinent studies noted   Past Surgical History:  Procedure Laterality Date  CORONARY CALCIUM SCORE  12/2018   Coronary calcium score-34   CORONARY CT ANGIOGRAM  06/2010    Coronary calcium score 12.  Mild nonobstructive disease.  Ostial LAD and first D1/RI.  Right dominant.  Focal scarring and bronchiectasis of medial right lower lobe.   NM MYOVIEW LTD  10/2010   Nonischemic.  Normal EF.    Immunization History  Administered Date(s) Administered   Hepatitis A, Adult 10/26/2015   Influenza Whole 10/07/2009   MMR 10/26/2015   PFIZER Comirnaty(Gray Top)Covid-19 Tri-Sucrose Vaccine 02/10/2020   PFIZER(Purple Top)SARS-COV-2 Vaccination 04/10/2019, 05/05/2019, 08/30/2019   Pneumococcal Conjugate-13 01/06/2018   Pneumococcal Polysaccharide-23 08/28/2019   Tdap 10/24/2014   Zoster, Live 09/01/2012    MEDICATIONS/ALLERGIES   Current Meds  Medication Sig   albuterol (VENTOLIN HFA) 108 (90 Base) MCG/ACT inhaler Inhale 2 puffs into the lungs every 6 (six) hours as needed for wheezing or shortness of  breath.   ALPRAZolam (XANAX) 0.25 MG tablet Take 1 tablet (0.25 mg total) by mouth at bedtime as needed for anxiety or sleep.   amLODipine (NORVASC) 5 MG tablet TAKE 1 TABLET(5 MG) BY MOUTH EVERY MORNING   cyanocobalamin 1000 MCG tablet Take 1,000 mcg by mouth daily.   fluticasone furoate-vilanterol (BREO ELLIPTA) 200-25 MCG/INH AEPB Inhale 1 puff into the lungs daily.   MEGARED OMEGA-3 KRILL OIL PO Take by mouth.   pravastatin (PRAVACHOL) 80 MG tablet Take 1 tablet (80 mg total) by mouth daily.   sertraline (ZOLOFT) 50 MG tablet Take 1 tablet (50 mg total) by mouth daily.   Vitamin D, Ergocalciferol, (DRISDOL) 1.25 MG (50000 UNIT) CAPS capsule Take 1 capsule (50,000 Units total) by mouth every 7 (seven) days.   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    OBJCTIVE -PE, EKG, labs   Wt Readings from Last 3 Encounters:  10/10/20 215 lb (97.5 kg)  07/28/20 222 lb 8 oz (100.9 kg)  05/30/20 221 lb 12.8 oz (100.6 kg)    Physical Exam: BP 138/70 (BP Location: Left Arm, Patient Position: Sitting, Cuff Size: Large)   Pulse 74   Ht 5' 5" (1.651 m)   Wt 215 lb (97.5 kg)   BMI 35.78 kg/m  Physical Exam Vitals reviewed.  Constitutional:      General: She is not in acute distress.    Appearance: Normal appearance. She is obese. She is not ill-appearing or toxic-appearing.  HENT:     Head: Normocephalic and atraumatic.  Neck:     Vascular: No carotid bruit.  Cardiovascular:     Rate and Rhythm: Normal  rate and regular rhythm. Occasional Extrasystoles are present.    Chest Wall: PMI is not displaced (Difficult to assess).     Pulses: Normal pulses.     Heart sounds: S1 normal and S2 normal. Heart sounds are distant. No murmur heard.   No friction rub. No gallop.  Pulmonary:  Effort: Pulmonary effort is normal. No respiratory distress.     Breath sounds: No wheezing, rhonchi or rales.  Chest:     Chest wall: Tenderness (Point tenderness along the costosternal border.) present.  Musculoskeletal:        General: Swelling (Trivial) present.     Cervical back: Normal range of motion and neck supple.  Skin:    General: Skin is warm and dry.  Neurological:     General: No focal deficit present.     Mental Status: She is alert and oriented to person, place, and time.  Psychiatric:        Mood and Affect: Mood normal.        Behavior: Behavior normal.        Thought Content: Thought content normal.        Judgment: Judgment normal.     Comments: Anxious    Adult ECG Report Not checked  Recent Labs: No recent lipids Lab Results  Component Value Date   CHOL 160 10/13/2020   HDL 49 10/13/2020   LDLCALC 92 10/13/2020   LDLDIRECT 161.6 06/03/2012   TRIG 104 10/13/2020   CHOLHDL 3.3 10/13/2020   Lab Results  Component Value Date   CREATININE 0.71 10/13/2020   BUN 12 10/13/2020   NA 140 10/13/2020   K 4.1 10/13/2020   CL 104 10/13/2020   CO2 22 10/13/2020   CBC Latest Ref Rng & Units 10/13/2020 10/26/2019 09/22/2019  WBC 3.4 - 10.8 x10E3/uL 6.2 8.5 6.2  Hemoglobin 11.1 - 15.9 g/dL 13.5 13.1 13.2  Hematocrit 34.0 - 46.6 % 40.3 38.2 41.7  Platelets 150 - 450 x10E3/uL 239 238 254    Lab Results  Component Value Date   HGBA1C 5.6 10/13/2020   Lab Results  Component Value Date   TSH 1.040 10/13/2020    ==================================================  COVID-19 Education: The signs and symptoms of COVID-19 were discussed with the patient and how to seek care for  testing (follow up with PCP or arrange E-visit).    I spent a total of 14 minutes with the patient spent in direct patient consultation.  Additional time spent with chart review  / charting (studies, outside notes, etc): 16 min Total Time: 30 min  Current medicines are reviewed at length with the patient today.  (+/- concerns) none  This visit occurred during the SARS-CoV-2 public health emergency.  Safety protocols were in place, including screening questions prior to the visit, additional usage of staff PPE, and extensive cleaning of exam room while observing appropriate contact time as indicated for disinfecting solutions.  Notice: This dictation was prepared with Dragon dictation along with smaller phrase technology. Any transcriptional errors that result from this process are unintentional and may not be corrected upon review.  Patient Instructions / Medication Changes & Studies & Tests Ordered   Patient Instructions  Medication Instructions:  INCREASE the Losartan to 50 mg once daily  *If you need a refill on your cardiac medications before your next appointment, please call your pharmacy*   Lab Work: Your provider would like for you to return in a few weeks to have the following labs drawn: CBC, A1C, CMET, TSH, fasting Lipid. You do not need an appointment for the lab. Once in our office lobby there is a podium where you can sign in and ring the doorbell to alert [place] that you are here. The lab is open from 8:00 am to 4:30 pm; closed for lunch from 12:45pm-1:45pm.  If you have  labs (blood work) drawn today and your tests are completely normal, you will receive your results only by: [place] (if you have MyChart) OR A paper copy in the mail If you have any lab test that is abnormal or we need to change your treatment, we will call you to review the results.   Testing/Procedures: None ordered   Follow-Up: At [facility], you and your health needs are our priority.  As  part of our continuing mission to provide you with exceptional heart care, we have created designated Provider Care Teams.  These Care Teams include your primary Cardiologist (physician) and Advanced Practice Providers (APPs -  Physician Assistants and Nurse Practitioners) who all work together to provide you with the care you need, when you need it.  We recommend signing up for the patient portal called "MyChart".  Sign up information is provided on this After Visit Summary.  MyChart is used to connect with patients for Virtual Visits (Telemedicine).  Patients are able to view lab/test results, encounter notes, upcoming appointments, etc.  Non-urgent messages can be sent to your provider as well.   To learn more about what you can do with MyChart, go to [email or web address].    Your next appointment:   12 month(s)  The format for your next appointment:   In Person  Provider:   You may see Glenetta Hew, MD or one of the following Advanced Practice Providers on your designated Care Team:   Rosaria Ferries, PA-C Caron Presume, PA-C Jory Sims, DNP, ANP   Studies Ordered:   Orders Placed This Encounter  Procedures   CBC   Comprehensive metabolic panel   Lipid panel   TSH   Hemoglobin A1c      Glenetta Hew, M.D., M.S. Interventional Cardiologist   Pager # [phone] Phone # [phone] [address] [place], [place] [address]   Thank you for choosing [facility] at [facility]!!

## 2020-10-10 NOTE — Patient Instructions (Addendum)
Medication Instructions:  INCREASE the Losartan to 50 mg once daily  *If you need a refill on your cardiac medications before your next appointment, please call your pharmacy*   Lab Work: Your provider would like for you to return in a few weeks to have the following labs drawn: CBC, A1C, CMET, TSH, fasting Lipid. You do not need an appointment for the lab. Once in our office lobby there is a podium where you can sign in and ring the doorbell to alert [place] that you are here. The lab is open from 8:00 am to 4:30 pm; closed for lunch from 12:45pm-1:45pm.  If you have labs (blood work) drawn today and your tests are completely normal, you will receive your results only by: [place] (if you have MyChart) OR A paper copy in the mail If you have any lab test that is abnormal or we need to change your treatment, we will call you to review the results.   Testing/Procedures: None ordered   Follow-Up: At [facility], you and your health needs are our priority.  As part of our continuing mission to provide you with exceptional heart care, we have created designated Provider Care Teams.  These Care Teams include your primary Cardiologist (physician) and Advanced Practice Providers (APPs -  Physician Assistants and Nurse Practitioners) who all work together to provide you with the care you need, when you need it.  We recommend signing up for the patient portal called "MyChart".  Sign up information is provided on this After Visit Summary.  MyChart is used to connect with patients for Virtual Visits (Telemedicine).  Patients are able to view lab/test results, encounter notes, upcoming appointments, etc.  Non-urgent messages can be sent to your provider as well.   To learn more about what you can do with MyChart, go to [email or web address].    Your next appointment:   12 month(s)  The format for your next appointment:   In Person  Provider:   You may see Glenetta Hew, MD or one of the  following Advanced Practice Providers on your designated Care Team:   Rosaria Ferries, PA-C Caron Presume, PA-C Jory Sims, DNP, ANP

## 2020-10-13 DIAGNOSIS — Z6836 Body mass index (BMI) 36.0-36.9, adult: Secondary | ICD-10-CM | POA: Diagnosis not present

## 2020-10-13 DIAGNOSIS — Z8249 Family history of ischemic heart disease and other diseases of the circulatory system: Secondary | ICD-10-CM | POA: Diagnosis not present

## 2020-10-13 DIAGNOSIS — E782 Mixed hyperlipidemia: Secondary | ICD-10-CM | POA: Diagnosis not present

## 2020-10-13 LAB — COMPREHENSIVE METABOLIC PANEL
ALT: 13 IU/L (ref 0–32)
AST: 17 IU/L (ref 0–40)
Albumin/Globulin Ratio: 1.9 (ref 1.2–2.2)
Albumin: 4.7 g/dL (ref 3.8–4.8)
Alkaline Phosphatase: 84 IU/L (ref 44–121)
BUN/Creatinine Ratio: 17 (ref 12–28)
BUN: 12 mg/dL (ref 8–27)
Bilirubin Total: 0.4 mg/dL (ref 0.0–1.2)
CO2: 22 mmol/L (ref 20–29)
Calcium: 9.2 mg/dL (ref 8.7–10.3)
Chloride: 104 mmol/L (ref 96–106)
Creatinine, Ser: 0.71 mg/dL (ref 0.57–1.00)
Globulin, Total: 2.5 g/dL (ref 1.5–4.5)
Glucose: 87 mg/dL (ref 65–99)
Potassium: 4.1 mmol/L (ref 3.5–5.2)
Sodium: 140 mmol/L (ref 134–144)
Total Protein: 7.2 g/dL (ref 6.0–8.5)
eGFR: 93 mL/min/{1.73_m2} (ref >59)

## 2020-10-13 LAB — CBC
Hematocrit: 40.3 % (ref 34.0–46.6)
Hemoglobin: 13.5 g/dL (ref 11.1–15.9)
MCH: 29.3 pg (ref 26.6–33.0)
MCHC: 33.5 g/dL (ref 31.5–35.7)
MCV: 88 fL (ref 79–97)
Platelets: 239 10*3/uL (ref 150–450)
RBC: 4.6 x10E6/uL (ref 3.77–5.28)
RDW: 14 % (ref 11.7–15.4)
WBC: 6.2 10*3/uL (ref 3.4–10.8)

## 2020-10-13 LAB — LIPID PANEL
Chol/HDL Ratio: 3.3 ratio (ref 0.0–4.4)
Cholesterol, Total: 160 mg/dL (ref 100–199)
HDL: 49 mg/dL (ref >39)
LDL Chol Calc (NIH): 92 mg/dL (ref 0–99)
Triglycerides: 104 mg/dL (ref 0–149)
VLDL Cholesterol Cal: 19 mg/dL (ref 5–40)

## 2020-10-13 LAB — TSH: TSH: 1.04 u[IU]/mL (ref 0.450–4.500)

## 2020-10-13 LAB — HEMOGLOBIN A1C
Est. average glucose Bld gHb Est-mCnc: 114 mg/dL
Hgb A1c MFr Bld: 5.6 % (ref 4.8–5.6)

## 2020-10-29 ENCOUNTER — Encounter: Payer: Self-pay | Admitting: Cardiology

## 2020-10-29 DIAGNOSIS — R0789 Other chest pain: Secondary | ICD-10-CM | POA: Insufficient documentation

## 2020-10-29 DIAGNOSIS — R002 Palpitations: Secondary | ICD-10-CM | POA: Insufficient documentation

## 2020-10-29 NOTE — Assessment & Plan Note (Addendum)
Fleeting episode of chest pain intermittently over couple days.  Since then no further symptoms.  Not made worse with exertion.  In fact made worse with palpation.  Likely musculoskeletal/costochondritis.  No further chest pain now.  Discussed using Tylenol, intermittent NSAIDs and warm compress

## 2020-10-29 NOTE — Assessment & Plan Note (Signed)
Telemedicine visit last year for palpitations.  He said to be well controlled.  She is stable from that standpoint doing well on sertraline.  No beta-blocker

## 2020-10-29 NOTE — Assessment & Plan Note (Signed)
Lipids showed LDL of 105 2021.  Due for labs rechecked now: Will check CBC A1c and c-Met along with FLP.  Continue current dose of pravastatin.  

## 2020-10-29 NOTE — Assessment & Plan Note (Signed)
Risk factor modification with blood pressure control and statin; She was started on aspirin by PCP.  Coronary calcium score would indicate that may not be necessary.

## 2020-10-29 NOTE — Assessment & Plan Note (Signed)
Blood pressures little high today.  Plan: Increase losartan to 50 mg and continue amlodipine  5 mg

## 2020-10-29 NOTE — Assessment & Plan Note (Signed)
Reassuring coronary calcium score last year.  Continue lipid management trying to reduce LDL to below 100 if not closer to 70.  Continue blood pressure control.  Increasing ARB dose today continuing calcitonin blocker.

## 2020-11-08 NOTE — Progress Notes (Signed)
No show

## 2020-11-16 DIAGNOSIS — H52223 Regular astigmatism, bilateral: Secondary | ICD-10-CM | POA: Diagnosis not present

## 2020-11-16 DIAGNOSIS — H5213 Myopia, bilateral: Secondary | ICD-10-CM | POA: Diagnosis not present

## 2020-11-21 ENCOUNTER — Ambulatory Visit: Payer: Medicare PPO | Admitting: Family Medicine

## 2020-11-21 ENCOUNTER — Other Ambulatory Visit: Payer: Self-pay

## 2020-11-21 ENCOUNTER — Encounter: Payer: Self-pay | Admitting: Family Medicine

## 2020-11-21 VITALS — BP 128/80 | HR 72 | Resp 16 | Ht 65.0 in | Wt 217.0 lb

## 2020-11-21 DIAGNOSIS — I700 Atherosclerosis of aorta: Secondary | ICD-10-CM | POA: Diagnosis not present

## 2020-11-21 DIAGNOSIS — I10 Essential (primary) hypertension: Secondary | ICD-10-CM

## 2020-11-21 DIAGNOSIS — E785 Hyperlipidemia, unspecified: Secondary | ICD-10-CM | POA: Diagnosis not present

## 2020-11-21 DIAGNOSIS — F411 Generalized anxiety disorder: Secondary | ICD-10-CM

## 2020-11-21 MED ORDER — SERTRALINE HCL 25 MG PO TABS: 25.0000 mg | ORAL_TABLET | Freq: Every day | ORAL | 2 refills | Status: DC

## 2020-11-21 NOTE — Assessment & Plan Note (Signed)
Continue Pravastatin 40 mg daily  

## 2020-11-21 NOTE — Patient Instructions (Addendum)
A few things to remember from today's visit:  Essential hypertension  Morbid obesity ([place])  Hyperlipidemia with target LDL less than 100  Generalized anxiety disorder - Plan: sertraline (ZOLOFT) 25 MG tablet  If you need refills please call your pharmacy. Do not use My Chart to request refills or for acute issues that need immediate attention.   10-15 min of daily walking. 1700 calories per day You can download a free app to keep up with calories intake.  Please be sure medication list is accurate. If a new problem present, please set up appointment sooner than planned today.  Plan de alimentacin DASH DASH Eating Plan DASH es la sigla en ingls de "Enfoques Alimentarios para Detener la Hipertensin". El plan de alimentacin DASH ha demostrado: Bajar la presin arterial elevada (hipertensin). Reducir el riesgo de diabetes tipo 2, enfermedad cardaca y accidente cerebrovascular. Ayudar a perder peso. Consejos para seguir Photographer las etiquetas de los alimentos Verifique la cantidad de sal (sodio) por porcin en las etiquetas de los alimentos. Elija alimentos con menos del 5 por ciento del valor diario de sodio. Generalmente, los alimentos con menos de 300 miligramos (mg) de sodio por porcin se encuadran dentro de este plan alimentario. Para encontrar cereales integrales, busque la palabra "integral" como primera palabra en la lista de ingredientes. Al ir de compras Compre productos en los que en su etiqueta diga: "bajo contenido de sodio" o "sin agregado de sal". Compre alimentos frescos. Evite los alimentos enlatados y comidas precocidas o congeladas. Al cocinar Evite agregar sal cuando cocine. Use hierbas o aderezos sin sal, en lugar de sal de mesa o sal marina. Consulte al mdico o farmacutico antes de usar sustitutos de la sal. No fra los alimentos. A la hora de cocinar los alimentos opte por hornearlos, hervirlos, grillarlos, asarlos al horno y asarlos a Music therapist. Cocine con aceites cardiosaludables, como oliva, canola, aguacate, soja o girasol. Planificacin de las comidas  Consuma una dieta equilibrada, que incluya lo siguiente: 4 o ms porciones de frutas y 4 o ms porciones de Set designer. Trate de que medio plato de cada comida sea de frutas y verduras. De 6 a 8 porciones de [employer]. Menos de 6 onzas (170 g) de carne, aves o pescado Games developer. Una porcin de 3 onzas (85 g) de carne tiene casi el mismo tamao que un mazo de cartas. Un huevo equivale a 1 onza (28 g). De 2 a 3 porciones de productos lcteos descremados por da. Una porcin es 1 taza (237 ml). 1 porcin de frutos secos, semillas o frijoles 5 veces por semana. De 2 a 3 porciones de grasas cardiosaludables. Las grasas saludables llamadas cidos grasos omega-3 se encuentran en alimentos como las nueces, las semillas de [place], las leches fortificadas y [place]. Estas grasas tambin se encuentran en los pescados de agua fra, como la sardina, el salmn y la caballa. Limite la cantidad que consume de: Alimentos enlatados o envasados. Alimentos con alto contenido de grasa trans, como algunos alimentos fritos. Alimentos con alto contenido de grasa saturada, como carne con grasa. Postres y otros dulces, bebidas azucaradas y otros alimentos con azcar agregada. Productos lcteos enteros. No le agregue sal a los alimentos antes de probarlos. No coma ms de 4 yemas de huevo por semana. Trate de comer al menos 2 comidas vegetarianas por semana. Consuma ms comida casera y menos de restaurante, de bares y comida rpida. Estilo de vida Cuando coma  en un restaurante, pida que preparen su comida con menos sal o, en lo posible, sin nada de sal. Si bebe alcohol: Limite la cantidad que bebe: De 0 a 1 medida por da para las mujeres que no estn embarazadas. De 0 a 2 medidas por da para los hombres. Est atento a la cantidad de alcohol que hay en las  bebidas que toma. En los [place], una medida equivale a una botella de cerveza de 12 oz (355 ml), un vaso de vino de 5 oz (148 ml) o un vaso de una bebida alcohlica de alta graduacin de 1 oz (44 ml). Informacin general Evite ingerir ms de 2300 mg de sal por da. Si tiene hipertensin, es posible que necesite reducir la ingesta de sodio a 1,500 mg por da. Trabaje con su mdico para mantener un peso saludable o perder [employer]. Pregntele cul es el peso recomendado para usted. Realice al menos 30 minutos de ejercicio que haga que se acelere su corazn (ejercicio Arboriculturist) la [employer] de la [place]. Estas actividades pueden incluir caminar, nadar o andar en bicicleta. Trabaje con su mdico o nutricionista para ajustar su plan alimentario a sus necesidades calricas personales. Qu alimentos debo comer? Frutas Todas las frutas frescas, congeladas o disecadas. Frutas enlatadas en jugo natural (sin agregado de azcar). Verduras Verduras frescas o congeladas (crudas, al vapor, asadas o grilladas). Jugos de tomate y verduras con bajo contenido de sodio o reducidos en sodio. Salsa y pasta de tomate con bajo contenido de sodio o reducidas en sodio. Verduras enlatadas con bajo contenido de sodio o reducidas en sodio. Granos Pan de salvado o integral. Pasta de salvado o integral. Arroz integral. Avena. Quinua. Trigo burgol. Cereales integrales y con bajo contenido de sodio. Pan pita. Galletitas de [place] con bajo contenido de [place] y [place]. Tortillas de [place] integral. Carnes y otras protenas Pollo o pavo sin piel. Carne de pollo o de [place]. Cerdo desgrasado. Pescado y [employer]. Claras de huevo. Porotos, guisantes o lentejas secos. Frutos secos, mantequilla de frutos secos y semillas sin sal. Frijoles enlatados sin sal. Cortes de carne vacuna magra, desgrasada. Carne precocida o curada magra y baja en sodio, como embutidos o panes de carne. Lcteos Leche descremada (1 %) o descremada.  Quesos reducidos en grasa, con bajo contenido de grasa o descremados. Queso blanco o ricota sin grasa, con bajo contenido de [place]. Yogur semidescremado o descremado. Queso con bajo contenido de [place] y [place]. Grasas y [employer] untables que no contengan grasas trans. Aceite vegetal. Lubertha Basque y aderezos para ensaladas livianos, reducidos en grasa o con bajo contenido de grasas (reducidos en sodio). Aceite de canola, crtamo, oliva, aguacate, soja y [place]. [place]. Alios y condimentos Hierbas. Especias. Mezclas de condimentos sin sal. Otros alimentos Palomitas de maz y pretzels sin sal. Dulces con bajo contenido de grasas. Es posible que los productos que se enumeran ms [place] no constituyan una lista completa de los alimentos y las bebidas que puede tomar. Consulte a un nutricionista para obtener ms informacin. Qu alimentos debo evitar? Lambert Mody Fruta enlatada en almbar liviano o espeso. Frutas cocidas en aceite. Frutas con salsa de crema o mantequilla. Verduras Verduras con crema o fritas. Verduras en [place]. Verduras enlatadas regulares (que no sean con bajo contenido de sodio o reducidas en sodio). Pasta y salsa de tomates enlatadas regulares (que no sean con bajo contenido de sodio o reducidas en sodio). Jugos de tomate y verduras regulares (que no sean con bajo  contenido de sodio o reducidos en sodio). Pepinillos. Aceitunas. Granos Productos de panificacin hechos con grasa, como medialunas, magdalenas y algunos panes. Comidas con arroz o pasta seca listas para usar. Carnes y otras protenas Cortes de carne con alto contenido de Lobbyist. Costillas. Carne frita. [place]. [place], salame y otras carnes precocidas o curadas, como embutidos o panes de carne. Grasa de la espalda del cerdo (panceta). [place]. Frutos secos y semillas con sal. Frijoles enlatados con agregado de sal. Pescado enlatado o ahumado. Huevos enteros o yemas. Pollo o pavo con  piel. Lcteos Leche entera o al 2 %, crema y mitad leche y mitad crema. Queso crema entero o con toda su grasa. Yogur entero o endulzado. Quesos con toda su grasa. Sustitutos de cremas no lcteas. Coberturas batidas. Quesos para untar y quesos procesados. Grasas y [employer]. Margarina en barra. [place]. Lardo. Mantequilla clarificada. Grasa de panceta. Aceites tropicales como aceite de coco, palmiste o palma. Alios y condimentos Sal de cebolla, sal de ajo, sal condimentada, sal de mesa y sal marina. Salsa [place]. Salsa trtara. Salsa barbacoa. Salsa teriyaki. Salsa de soja, incluso la que tiene contenido reducido de [place]. Salsa de carne. Salsas en lata y envasadas. Salsa de pescado. Salsa de [place]. Salsa rosada. Rbanos picantes comprados en tiendas. [place]. [place]. Saborizantes y tiernizantes para carne. Caldo en cubitos. Salsas picantes. Adobos preelaborados o envasados. Aderezos para tacos preelaborados o envasados. Salsas de pepinillos. Aderezos comunes para ensalada. Otros alimentos Palomitas de maz y pretzels con sal. Es posible que los productos que se enumeran ms arriba no constituyan una lista completa de los alimentos y las bebidas que Nurse, adult. Consulte a un nutricionista para obtener ms informacin. Dnde buscar ms informacin National Heart, Lung, and Blood Institute ([place], los Pulmones y Herbalist): https://wilson-eaton.com/ American Heart Association (Asociacin Estadounidense del [place]n): www.heart.org Academy of Nutrition and Dietetics (Academia de Nutricin y Information systems manager): www.eatright.[place] ([place]): www.kidney.org Resumen El plan de alimentacin DASH ha demostrado bajar la presin arterial elevada (hipertensin). Tambin puede reducir [employer] de diabetes tipo 2, enfermedad cardaca y accidente cerebrovascular. Cuando siga el plan de alimentacin DASH, trate de comer ms frutas  frescas y verduras, cereales integrales, carnes magras, lcteos descremados y grasas cardiosaludables. Con el plan de alimentacin DASH, deber limitar el consumo de sal (sodio) a 2,300 mg por da. Si tiene hipertensin, es posible que necesite reducir la ingesta de sodio a 1,500 mg por da. Trabaje con su mdico o nutricionista para ajustar su plan alimentario a sus necesidades calricas personales. Esta informacin no tiene Marine scientist el consejo del mdico. Asegrese de hacerle al mdico cualquier pregunta que tenga. Document Revised: 02/19/2019 Document Reviewed: 02/19/2019 Elsevier Patient Education  2022 [employer].

## 2020-11-21 NOTE — Assessment & Plan Note (Signed)
Continue Pravastatin 80 mg 0.5 tab daily. Low fat diet also recommended.

## 2020-11-21 NOTE — Assessment & Plan Note (Signed)
BP adequately controlled. Continue current management: Losartan 50 mg daily and Amlodipine 5 mg daily. DASH/low salt diet recommended. Continue monitoring BP at home.

## 2020-11-21 NOTE — Assessment & Plan Note (Signed)
BMI 36. Co-morbilities: HTN,OA,and GERD. She understands benefits of wt loss as well as adverse effects of obesity. Consistency with healthy diet and physical activity encouraged. She has lost about 5 Pb sine 06/2020. 1700 Kcal/day. Daily brisk walking for 15-30 min as tolerated. She would like to pursue bariatric procedure. Form completed.

## 2020-11-21 NOTE — Assessment & Plan Note (Signed)
Continue Sertraline 25 mg daily and Alprazolam 0.25 mg daily prn. Some side effects of medications discussed.

## 2020-11-21 NOTE — Progress Notes (Signed)
HPI: [name] is a 70 y.o. female, who is here today to followon some of ehr chronic medical probems.  She was last seen on 07/28/20 for acute RUI. Since her last visit she has seen cardiologist for CP.  Underwent cardiac monitor in 01/2020: Sinus rhythm: Minimum heart rate 55 bpm, maximum 126 bpm with an average of 73 bpm. Rare (< 1%) isolated PACs and PVCs with the very rare PAC couplets. 1 PAC quadruplet-officially short burst of PAT-rate 148 bpm with an average rate of 124 bpm. No sustained arrhythmias or significant bradycardia.  HLD: She is on Pravastatin 80 mg 1/2 tab daily. Aortic atherosclerosis seen on imaging.  Lab Results  Component Value Date   CHOL 160 10/13/2020   HDL 49 10/13/2020   LDLCALC 92 10/13/2020   LDLDIRECT 161.6 06/03/2012   TRIG 104 10/13/2020   CHOLHDL 3.3 10/13/2020   Hypertension:  Medications:Losartan was increased from 25 mg to 50 mg. She is also on Amlodipine 5 mg daily. BP readings at home:< 140/90 Side effects:None.  Negative for unusual or severe headache, visual changes, exertional chest pain, dyspnea,  focal weakness, or edema.  Lab Results  Component Value Date   CREATININE 0.71 10/13/2020   BUN 12 10/13/2020   NA 140 10/13/2020   K 4.1 10/13/2020   CL 104 10/13/2020   CO2 22 10/13/2020   She will be seeing cardiologist annually.  She has seen pulmonologist, Dx'ed asthma.  Anxiety: She decreased Sertraline 50 mg 1/2 tab daily. She felt like Sertraline 50 mg was too strong.  Sertraline 25 mg daily is helping. She is under a lot of stress, her 33 yo son is back home. He has hx of depression and was not doing well by his own. She is also on Alprazolam 0.25 mg daily prn. She is also using CBD oil drops also helps with anxiety. Sleeping about 7 hours.  Frustrated because has not been able to lose wt.  She has not been consistent with following a healthy diet or engaging in regular physical activity. She loves rice,  bread,and snacking. She is interested in undergoing bariatric procedure, already attended the general meeting and needs a form to be completed.  Review of Systems  Constitutional:  Positive for fatigue. Negative for activity change, appetite change and fever.  HENT:  Negative for mouth sores, nosebleeds and sore throat.   Respiratory:  Negative for cough and wheezing.   Gastrointestinal:  Negative for abdominal pain, nausea and vomiting.       Negative for changes in bowel habits.  Endocrine: Negative for cold intolerance and heat intolerance.  Genitourinary:  Negative for decreased urine volume and hematuria.  Allergic/Immunologic: Positive for environmental allergies.  Neurological:  Negative for syncope, facial asymmetry and weakness.  Psychiatric/Behavioral:  Negative for confusion. The patient is nervous/anxious.   Rest see pertinent positives and negatives per HPI.  Current Outpatient Medications on File Prior to Visit  Medication Sig Dispense Refill   albuterol (VENTOLIN HFA) 108 (90 Base) MCG/ACT inhaler Inhale 2 puffs into the lungs every 6 (six) hours as needed for wheezing or shortness of breath. 1 each 11   ALPRAZolam (XANAX) 0.25 MG tablet Take 1 tablet (0.25 mg total) by mouth at bedtime as needed for anxiety or sleep. 30 tablet 1   amLODipine (NORVASC) 5 MG tablet TAKE 1 TABLET(5 MG) BY MOUTH EVERY MORNING 90 tablet 1   cyanocobalamin 1000 MCG tablet Take 1,000 mcg by mouth daily.  fluticasone furoate-vilanterol (BREO ELLIPTA) 200-25 MCG/INH AEPB Inhale 1 puff into the lungs daily. 60 each 11   losartan (COZAAR) 50 MG tablet Take 1 tablet (50 mg total) by mouth daily. 90 tablet 3   MEGARED OMEGA-3 KRILL OIL PO Take by mouth.     pravastatin (PRAVACHOL) 80 MG tablet Take 1 tablet (80 mg total) by mouth daily. 90 tablet 3   Vitamin D, Ergocalciferol, (DRISDOL) 1.25 MG (50000 UNIT) CAPS capsule Take 1 capsule (50,000 Units total) by mouth every 7 (seven) days. 4 capsule 0    fluticasone (FLONASE) 50 MCG/ACT nasal spray PLACE 1 SPRAY INTO BOTH NOSTRILS 2 (TWO) TIMES DAILY 16 mL 2   No current facility-administered medications on file prior to visit.   Past Medical History:  Diagnosis Date   Allergic rhinitis    Anxiety and depression    Constipation, chronic    Coronary artery calcification seen on CAT scan 06/2010   Mild nonobstructive CAD on cardiac CT  -as of 2021, coronary calcium score 34   Hemorrhoids    Hip pain, bilateral    Hyperlipidemia    Hypertension    Leukoplakia of vagina    Low back pain    w/ left side radiculopathy, s/p injection at L4-L5 forarmen 1/06   Uterine fibroid    No Known Allergies  Social History   Socioeconomic History   Marital status: Divorced    Spouse name: Not on file   Number of children: 3   Years of education: Not on file   Highest education level: Not on file  Occupational History   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   Social Determinants of Health   Financial Resource Strain: Low Risk    Difficulty of Paying Living Expenses: Not hard at all  Food Insecurity: No Food Insecurity   Worried About Charity fundraiser in the Last Year: Never true   [place] in the Last Year: Never true  Transportation Needs: No Transportation Needs   Lack of Transportation (Medical): No   Lack of Transportation (Non-Medical): No  Physical Activity: Sufficiently Active   Days of Exercise per Week: 5 days   Minutes of Exercise per Session: 30  min  Stress: No Stress Concern Present   Feeling of Stress : Not at all  Social Connections: Moderately Integrated   Frequency of Communication with Friends and Family: More than three times a week   Frequency of Social Gatherings with Friends and Family: More than three times a week   Attends Religious Services: 1 to 4 times per year   Active Member of [employer] or Organizations: Yes   Attends Archivist Meetings: 1 to 4 times per year   Marital Status: Divorced   Vitals:   11/21/20 1453  BP: 128/80  Pulse: 72  Resp: 16  SpO2: 97%   Body mass index is 36.11 kg/m.  Physical Exam Vitals and nursing  note reviewed.  Constitutional:      General: She is not in acute distress.    Appearance: She is well-developed.  HENT:     Head: Normocephalic and atraumatic.     Mouth/Throat:     Mouth: Mucous membranes are moist.     Pharynx: Oropharynx is clear.  Eyes:     Conjunctiva/sclera: Conjunctivae normal.  Cardiovascular:     Rate and Rhythm: Normal rate and regular rhythm.     Pulses:          Dorsalis pedis pulses are 2+ on the right side and 2+ on the left side.     Heart sounds: No murmur heard. Pulmonary:     Effort: Pulmonary effort is normal. No respiratory distress.     Breath sounds: Normal breath sounds.  Abdominal:     Palpations: Abdomen is soft. There is no hepatomegaly or mass.     Tenderness: There is no abdominal tenderness.  Lymphadenopathy:     Cervical: No cervical adenopathy.  Skin:    General: Skin is warm.     Findings: No erythema or rash.  Neurological:     General: No focal deficit present.     Mental Status: She is alert and oriented to person, place, and time.     Cranial Nerves: No cranial nerve deficit.     Gait: Gait normal.  Psychiatric:     Comments: Well groomed, good eye contact.   ASSESSMENT AND PLAN:  [name] was seen today for form completion.  Diagnoses and all orders for this visit:  Morbid obesity ([place]) BMI  36. Co-morbilities: HTN,OA,and GERD. She understands benefits of wt loss as well as adverse effects of obesity. Consistency with healthy diet and physical activity encouraged. She has lost about 5 Pb sine 06/2020. 1700 Kcal/day. Daily brisk walking for 15-30 min as tolerated. She would like to pursue bariatric procedure. Form completed.   Essential hypertension BP adequately controlled. Continue current management: Losartan 50 mg daily and Amlodipine 5 mg daily. DASH/low salt diet recommended. Continue monitoring BP at home.  Anxiety disorder Continue Sertraline 25 mg daily and Alprazolam 0.25 mg daily prn. Some side effects of medications discussed.  Hyperlipidemia with target LDL less than 100 Continue Pravastatin 80 mg 0.5 tab daily. Low fat diet also recommended.  Aortic atherosclerosis ([facility]) Continue Pravastatin 40 mg daily.  I spent a total of 40 minutes in both face to face and non face to face activities for this visit on the date of this encounter. During this time history was obtained and documented, examination was performed, prior labs reviewed, and assessment/plan discussed.  Return in about 6 months (around 05/22/2021) for HTN,Wt.   Diana Armijo G. [place], MD  [facility]. [place] office.

## 2020-12-16 ENCOUNTER — Encounter: Payer: Self-pay | Admitting: Family Medicine

## 2020-12-16 ENCOUNTER — Telehealth (INDEPENDENT_AMBULATORY_CARE_PROVIDER_SITE_OTHER): Payer: Medicare PPO | Admitting: Family Medicine

## 2020-12-16 ENCOUNTER — Telehealth: Payer: Self-pay | Admitting: Family Medicine

## 2020-12-16 DIAGNOSIS — J019 Acute sinusitis, unspecified: Secondary | ICD-10-CM | POA: Diagnosis not present

## 2020-12-16 MED ORDER — AMOXICILLIN 500 MG PO TABS: 500.0000 mg | ORAL_TABLET | Freq: Two times a day (BID) | ORAL | 0 refills | Status: DC

## 2020-12-16 NOTE — Telephone Encounter (Signed)
Patient calling in with respiratory symptoms: Shortness of breath, chest pain, palpitations or other red words send to Triage  Does the patient have a fever over 100, cough, congestion, sore throat, runny nose, lost of taste/smell within the last 5 days (please list symptoms that patient has)? Sore throat, temp, cough, bodyaches,chills  Have you tested for Covid in the last 5 days? No   If yes, was it positive []  OR negative [] ? If positive in the last 5 days, please schedule virtual visit now. If negative, schedule for an in person OV with the next available provider if PCP has no openings. Please also let patient know they will be tested again (follow the script below)  "you will have to arrive 49mins prior to your appt time to be Covid tested. Please park in back of office at the cone & call [phone] to let the staff know you have arrived. A staff member will meet you at your car to do a rapid covid test. Once the test has resulted you will be notified by phone of your results to determine if appt will remain an in person visit or be converted to a virtual/phone visit." Pt has virtual appt with dr banks at 4 pm today THINGS TO REMEMBER  If no availability for virtual visit in office,  please schedule another [place] office  If no availability at another [place] office, please instruct patient that they can schedule an evisit or virtual visit through their mychart account. Visits up to 8pm  patients can be seen in office 5 days after positive COVID test

## 2020-12-16 NOTE — Progress Notes (Signed)
Virtual Visit via Video Note  I connected with Veronica [name] on 12/16/20 at  4:00 PM EST by a video enabled telemedicine application 2/2 [id number] pandemic and verified that I am speaking with the correct person using two identifiers.  Location patient: home Location provider:work or home office Persons participating in the virtual visit: patient, provider  I discussed the limitations of evaluation and management by telemedicine and the availability of in person appointments. The patient expressed understanding and agreed to proceed.   HPI: Pt is a 70 yo female with pmh sig for HTN, aortic atherosclerosis, GERD, HLD, anxiety, palpitations, vitamin D was followed by Dr. Martinique and seen today for acute concern.  Pt started feeling sick last Saturday (6 days ago).  Having chills, throat pain, body aches, sweating, HAs, chest congestion, and ear pain.  Pt had a fever Tmax 103F, afebrile x2 days.  Endorses green mucus from nose.  Denies n/v, diarrhea, facial pain/pressure, decreased appetite.  Sick contacts included her grandson and her son.  Pt tried OTC meds (white powder for flu).    ROS: See pertinent positives and negatives per HPI.  Past Medical History:  Diagnosis Date   Allergic rhinitis    Anxiety and depression    Constipation, chronic    Coronary artery calcification seen on CAT scan 06/2010   Mild nonobstructive CAD on cardiac CT  -as of 2021, coronary calcium score 34   Hemorrhoids    Hip pain, bilateral    Hyperlipidemia    Hypertension    Leukoplakia of vagina    Low back pain    w/ left side radiculopathy, s/p injection at L4-L5 forarmen 1/06   Uterine fibroid     Past Surgical History:  Procedure Laterality Date   ABDOMINAL HYSTERECTOMY  11-2000   no oophorectomy per pt   CHOLECYSTECTOMY  12/08/2010   Procedure: LAPAROSCOPIC CHOLECYSTECTOMY WITH INTRAOPERATIVE CHOLANGIOGRAM;  Surgeon: Pedro Earls, MD;  Location: [facility];  Service: General;  Laterality: N/A;  c-arm     COLONOSCOPY WITH PROPOFOL N/A 02/02/2014   Procedure: COLONOSCOPY WITH PROPOFOL;  Surgeon: Garlan Fair, MD;  Location: [facility];  Service: Endoscopy;  Laterality: N/A;   CORONARY CALCIUM SCORE  12/2018   Coronary calcium score-34   CORONARY CT ANGIOGRAM  06/2010    Coronary calcium score 12.  Mild nonobstructive disease.  Ostial LAD and first D1/RI.  Right dominant.  Focal scarring and bronchiectasis of medial right lower lobe.   LEEP     unknown date    NM MYOVIEW LTD  10/2010   Nonischemic.  Normal EF.   TUBAL LIGATION     bilateral 1989   UTERINE FIBROID SURGERY      Family History  Problem Relation Age of Onset   Schizophrenia Son    Schizophrenia Brother    CVA Brother    Valvular heart disease Brother        Valve Surgery   Schizophrenia Other        2 nieces   Hypertension Mother    Hyperlipidemia Mother    Heart attack Mother 68   Heart disease Mother    Anxiety disorder Mother    Hypertension Father    Hyperlipidemia Father    AAA (abdominal aortic aneurysm) Father 44   Heart disease Father    Heart disease Brother        Vavle surgery   Diabetes Neg Hx    Colon cancer Neg Hx    Breast cancer Neg  Hx       Current Outpatient Medications:    albuterol (VENTOLIN HFA) 108 (90 Base) MCG/ACT inhaler, Inhale 2 puffs into the lungs every 6 (six) hours as needed for wheezing or shortness of breath., Disp: 1 each, Rfl: 11   ALPRAZolam (XANAX) 0.25 MG tablet, Take 1 tablet (0.25 mg total) by mouth at bedtime as needed for anxiety or sleep., Disp: 30 tablet, Rfl: 1   amLODipine (NORVASC) 5 MG tablet, TAKE 1 TABLET(5 MG) BY MOUTH EVERY MORNING, Disp: 90 tablet, Rfl: 1   cyanocobalamin 1000 MCG tablet, Take 1,000 mcg by mouth daily., Disp: , Rfl:    fluticasone furoate-vilanterol (BREO ELLIPTA) 200-25 MCG/INH AEPB, Inhale 1 puff into the lungs daily., Disp: 60 each, Rfl: 11   losartan (COZAAR) 50 MG tablet, Take 1 tablet (50 mg total) by mouth daily., Disp: 90  tablet, Rfl: 3   MEGARED OMEGA-3 KRILL OIL PO, Take by mouth., Disp: , Rfl:    pravastatin (PRAVACHOL) 80 MG tablet, Take 1 tablet (80 mg total) by mouth daily., Disp: 90 tablet, Rfl: 3   sertraline (ZOLOFT) 25 MG tablet, Take 1 tablet (25 mg total) by mouth daily., Disp: 90 tablet, Rfl: 2   Vitamin D, Ergocalciferol, (DRISDOL) 1.25 MG (50000 UNIT) CAPS capsule, Take 1 capsule (50,000 Units total) by mouth every 7 (seven) days., Disp: 4 capsule, Rfl: 0   fluticasone (FLONASE) 50 MCG/ACT nasal spray, PLACE 1 SPRAY INTO BOTH NOSTRILS 2 (TWO) TIMES DAILY, Disp: 16 mL, Rfl: 2  EXAM:  VITALS per patient if applicable: RR between 16-10 bpm.  GENERAL: alert, oriented, appears well and in no acute distress  HEENT: atraumatic, conjunctiva clear, no obvious abnormalities on inspection of external nose and ears  NECK: normal movements of the head and neck  LUNGS: on inspection no signs of respiratory distress, breathing rate appears normal, no obvious gross SOB, gasping or wheezing  CV: no obvious cyanosis  MS: moves all visible extremities without noticeable abnormality  PSYCH/NEURO: pleasant and cooperative, no obvious depression or anxiety, speech and thought processing grossly intact  ASSESSMENT AND PLAN:  Discussed the following assessment and plan:  Acute sinusitis, recurrence not specified, unspecified location - Plan: amoxicillin (AMOXIL) 500 MG tablet -Continue supportive care -Start amoxicillin -consider Flonase as needed -Given precautions for continued or worsening symptoms.    I discussed the assessment and treatment plan with the patient. The patient was provided an opportunity to ask questions and all were answered. The patient agreed with the plan and demonstrated an understanding of the instructions.   The patient was advised to call back or seek an in-person evaluation if the symptoms worsen or if the condition fails to improve as anticipated.   [name], MD

## 2020-12-28 DIAGNOSIS — I10 Essential (primary) hypertension: Secondary | ICD-10-CM | POA: Diagnosis not present

## 2020-12-28 DIAGNOSIS — E785 Hyperlipidemia, unspecified: Secondary | ICD-10-CM | POA: Diagnosis not present

## 2021-01-02 DIAGNOSIS — F331 Major depressive disorder, recurrent, moderate: Secondary | ICD-10-CM | POA: Diagnosis not present

## 2021-01-19 ENCOUNTER — Other Ambulatory Visit: Payer: Self-pay | Admitting: Family Medicine

## 2021-01-26 ENCOUNTER — Emergency Department (HOSPITAL_COMMUNITY): Payer: Medicare PPO

## 2021-01-26 ENCOUNTER — Telehealth: Payer: Self-pay

## 2021-01-26 ENCOUNTER — Emergency Department (HOSPITAL_COMMUNITY)
Admission: EM | Admit: 2021-01-26 | Discharge: 2021-01-26 | Disposition: A | Discharge status: Home / Self Care | Payer: Medicare PPO | Attending: Emergency Medicine | Admitting: Emergency Medicine

## 2021-01-26 ENCOUNTER — Encounter (HOSPITAL_COMMUNITY): Payer: Self-pay

## 2021-01-26 DIAGNOSIS — J9811 Atelectasis: Secondary | ICD-10-CM | POA: Diagnosis not present

## 2021-01-26 DIAGNOSIS — Z5321 Procedure and treatment not carried out due to patient leaving prior to being seen by health care provider: Secondary | ICD-10-CM | POA: Insufficient documentation

## 2021-01-26 DIAGNOSIS — R079 Chest pain, unspecified: Secondary | ICD-10-CM | POA: Insufficient documentation

## 2021-01-26 DIAGNOSIS — I10 Essential (primary) hypertension: Secondary | ICD-10-CM | POA: Insufficient documentation

## 2021-01-26 NOTE — ED Triage Notes (Signed)
Pt arrived via POV, c/o chest pain this morning. Started while at rest. Left sided, non reproducible. States also htn x1 week at night. Has hx of HTN, states has been taking meds as prescribed.

## 2021-01-26 NOTE — Telephone Encounter (Signed)
Patient came in post ED today.  Would like an follow up appt with Dr harding ASAP.

## 2021-02-05 ENCOUNTER — Other Ambulatory Visit: Payer: Self-pay

## 2021-02-05 ENCOUNTER — Emergency Department (HOSPITAL_COMMUNITY): Payer: Medicare PPO

## 2021-02-05 ENCOUNTER — Emergency Department (HOSPITAL_COMMUNITY)
Admission: EM | Admit: 2021-02-05 | Discharge: 2021-02-06 | Disposition: A | Discharge status: Home / Self Care | Payer: Medicare PPO | Attending: Emergency Medicine | Admitting: Emergency Medicine

## 2021-02-05 DIAGNOSIS — R079 Chest pain, unspecified: Secondary | ICD-10-CM | POA: Insufficient documentation

## 2021-02-05 DIAGNOSIS — I10 Essential (primary) hypertension: Secondary | ICD-10-CM | POA: Insufficient documentation

## 2021-02-05 DIAGNOSIS — R0602 Shortness of breath: Secondary | ICD-10-CM | POA: Insufficient documentation

## 2021-02-05 DIAGNOSIS — Z79899 Other long term (current) drug therapy: Secondary | ICD-10-CM | POA: Diagnosis not present

## 2021-02-05 DIAGNOSIS — J45909 Unspecified asthma, uncomplicated: Secondary | ICD-10-CM | POA: Diagnosis not present

## 2021-02-05 DIAGNOSIS — Z7951 Long term (current) use of inhaled steroids: Secondary | ICD-10-CM | POA: Insufficient documentation

## 2021-02-05 DIAGNOSIS — R0789 Other chest pain: Secondary | ICD-10-CM | POA: Diagnosis not present

## 2021-02-05 DIAGNOSIS — R0600 Dyspnea, unspecified: Secondary | ICD-10-CM

## 2021-02-05 LAB — CBC
HCT: 38.1 % (ref 36.0–46.0)
Hemoglobin: 13.2 g/dL (ref 12.0–15.0)
MCH: 30.3 pg (ref 26.0–34.0)
MCHC: 34.6 g/dL (ref 30.0–36.0)
MCV: 87.4 fL (ref 80.0–100.0)
Platelets: 270 10*3/uL (ref 150–400)
RBC: 4.36 MIL/uL (ref 3.87–5.11)
RDW: 13 % (ref 11.5–15.5)
WBC: 6.3 10*3/uL (ref 4.0–10.5)
nRBC: 0 % (ref 0.0–0.2)

## 2021-02-05 LAB — TROPONIN I (HIGH SENSITIVITY): Troponin I (High Sensitivity): 3 ng/L (ref <18)

## 2021-02-05 LAB — BASIC METABOLIC PANEL
Anion gap: 7 (ref 5–15)
BUN: 9 mg/dL (ref 8–23)
CO2: 27 mmol/L (ref 22–32)
Calcium: 9.7 mg/dL (ref 8.9–10.3)
Chloride: 100 mmol/L (ref 98–111)
Creatinine, Ser: 0.75 mg/dL (ref 0.44–1.00)
GFR, Estimated: >60 mL/min (ref >60)
Glucose, Bld: 98 mg/dL (ref 70–99)
Potassium: 3.8 mmol/L (ref 3.5–5.1)
Sodium: 134 mmol/L — ABNORMAL LOW (ref 135–145)

## 2021-02-05 NOTE — ED Provider Triage Note (Signed)
Emergency Medicine Provider Triage Evaluation Note  Veronica [name] , a 71 y.o. female  was evaluated in triage.  Pt complains of chest pain, burning on and off for the last several days. Reports 3 prior ED visits. Associated with SOB. No nausea and vomiting. Pain in left arm. No hx of heart attacks, does have hx HTN. On losartan. No hx diabetes, no tobacco use.  Review of Systems  Positive: Chest pain, shortness of breath, left arm pain Negative: Nausea, vomiting  Physical Exam  BP (!) 195/92 (BP Location: Right Arm)    Pulse 75    Temp 98.6 F (37 C) (Oral)    Resp 17    Ht 5\' 5"  (1.651 m)    Wt 97.5 kg    SpO2 95%    BMI 35.78 kg/m  Gen:   Awake, no distress, quit anxious Resp:  Normal effort  MSK:   Moves extremities without difficulty  Other:    Medical Decision Making  Medically screening exam initiated at 7:34 PM.  Appropriate orders placed.  Veronica [name] was informed that the remainder of the evaluation will be completed by another provider, this initial triage assessment does not replace that evaluation, and the importance of remaining in the ED until their evaluation is complete.  Workup initiated   Anselmo Pickler, [place] 02/05/21 1938

## 2021-02-05 NOTE — ED Triage Notes (Signed)
Pt c/o chest pain and shortness of breath for the past week. Pt states the pain comes and goes.

## 2021-02-06 LAB — TROPONIN I (HIGH SENSITIVITY): Troponin I (High Sensitivity): 4 ng/L (ref <18)

## 2021-02-06 NOTE — Discharge Instructions (Addendum)
Please return to the emergency department if you experience worsening symptoms.

## 2021-02-06 NOTE — Progress Notes (Signed)
HPI: [name] is a 71 y.o. female, who is here today to follow on recent ED visit. She was evaluated in the ED on 02/05/21 because CP and SOB. She has felt "bad" since 12/2020. SOB exacerbates anxiety. She has not identified exacerbating factors, SOB is with and without exertion. No associated cough and no wheezing.  Left-sided chest pressure sensation,radiated to left arm. Sometimes she feels some numbness left upper back. This is not exacerbated by exertion and it is alleviated with breathing exercises. She takes Sertraline 25 mg daily and Alprazolam 0.25 mg daily prn. No new stressors.  Negative for palpitations or diaphoresis. She has not noted LE edema,pain,or erythema.  All these symptoms seems to improved when she goes outdoors for a walk.  She was in the ER 01/26/21, she left before being seen.  BP 190/90 before she went back to the ER on 02/05/21. She is on Losartan 50  mg daily and Amlodipine 5 mg daily. She thinks her BP monitor may not be accurate.  Coronary CT in 06/2020: 1.  Mild nonobstructive coronary artery disease.  The patient's total coronary artery calcium score is 12.62, which is 75th percentile for patient's matched age and gender. 2.  Nonobstructive calcified plaques at the origin of the LAD, the proximal LAD, and in the midportion of a large first diagonal/ramus branch. 3.  Right coronary artery dominance. 4.  Focal area of scarring and bronchiectasis medially in the right lower lobe.  No other significant non-cardiac findings.   Underwent cardiac monitor in 01/2020: Sinus rhythm: Minimum heart rate 55 bpm, maximum 126 bpm with an average of 73 bpm. Rare (< 1%) isolated PACs and PVCs with the very rare PAC couplets. 1 PAC quadruplet-officially short burst of PAT-rate 148 bpm with an average rate of 124 bpm. No sustained arrhythmias or significant bradycardia.  She follows with cardiologist annually.  02/05/21 CXR negative for cardiopulmonary  disease.  Lab Results  Component Value Date   CREATININE 0.75 02/05/2021   BUN 9 02/05/2021   NA 134 (L) 02/05/2021   K 3.8 02/05/2021   CL 100 02/05/2021   CO2 27 02/05/2021   Lab Results  Component Value Date   WBC 6.3 02/05/2021   HGB 13.2 02/05/2021   HCT 38.1 02/05/2021   MCV 87.4 02/05/2021   PLT 270 02/05/2021   She would like to see pulmonologist. Hx of asthma when she was living in [place], a few years ago. Resumed Albuterol inhaler yesterday and she has felt better. She has not been on Breo ellipta 200-25 mcg , prescribed by pulmonologist.  Review of Systems  Constitutional:  Negative for activity change, appetite change and fever.  HENT:  Negative for sore throat and trouble swallowing.   Eyes:  Negative for redness and visual disturbance.  Gastrointestinal:  Negative for abdominal pain, nausea and vomiting.       Negative for changes in bowel habits.  Genitourinary:  Negative for decreased urine volume, dysuria and hematuria.  Allergic/Immunologic: Positive for environmental allergies.  Neurological:  Negative for syncope, weakness and headaches.  Psychiatric/Behavioral:  Negative for confusion. The patient is nervous/anxious.   Rest see pertinent positives and negatives per HPI.  Current Outpatient Medications on File Prior to Visit  Medication Sig Dispense Refill   albuterol (VENTOLIN HFA) 108 (90 Base) MCG/ACT inhaler Inhale 2 puffs into the lungs every 6 (six) hours as needed for wheezing or shortness of breath. 1 each 11   ALPRAZolam (XANAX) 0.25 MG tablet Take 1  tablet (0.25 mg total) by mouth at bedtime as needed for anxiety or sleep. 30 tablet 1   amLODipine (NORVASC) 5 MG tablet TAKE 1 TABLET(5 MG) BY MOUTH EVERY MORNING (Patient taking differently: Take 5 mg by mouth at bedtime.) 90 tablet 1   amoxicillin (AMOXIL) 500 MG tablet Take 1 tablet (500 mg total) by mouth 2 (two) times daily. (Patient not taking: Reported on 02/06/2021) 14 tablet 0    cholecalciferol (VITAMIN D) 25 MCG (1000 UNIT) tablet Take 1,000 Units by mouth daily.     fluticasone (FLONASE) 50 MCG/ACT nasal spray PLACE 1 SPRAY INTO BOTH NOSTRILS 2 (TWO) TIMES DAILY 16 mL 2   fluticasone furoate-vilanterol (BREO ELLIPTA) 200-25 MCG/INH AEPB Inhale 1 puff into the lungs daily. (Patient not taking: Reported on 02/06/2021) 60 each 11   losartan (COZAAR) 50 MG tablet Take 1 tablet (50 mg total) by mouth daily. 90 tablet 3   MEGARED OMEGA-3 KRILL OIL PO Take 1 capsule by mouth daily.     naproxen sodium (ALEVE) 220 MG tablet Take 220 mg by mouth daily as needed (pain).     OVER THE COUNTER MEDICATION Take 2 tablets by mouth at bedtime. Tryptophan,magnesium,vitamin b6     pravastatin (PRAVACHOL) 80 MG tablet Take 1 tablet (80 mg total) by mouth daily. (Patient taking differently: Take 80 mg by mouth at bedtime.) 90 tablet 3   sertraline (ZOLOFT) 25 MG tablet Take 1 tablet (25 mg total) by mouth daily. (Patient taking differently: Take 12.5 mg by mouth daily.) 90 tablet 2   Vitamin D, Ergocalciferol, (DRISDOL) 1.25 MG (50000 UNIT) CAPS capsule Take 1 capsule (50,000 Units total) by mouth every 7 (seven) days. (Patient not taking: Reported on 02/06/2021) 4 capsule 0   No current facility-administered medications on file prior to visit.    Past Medical History:  Diagnosis Date   Allergic rhinitis    Anxiety and depression    Constipation, chronic    Coronary artery calcification seen on CAT scan 06/2010   Mild nonobstructive CAD on cardiac CT  -as of 2021, coronary calcium score 34   Hemorrhoids    Hip pain, bilateral    Hyperlipidemia    Hypertension    Leukoplakia of vagina    Low back pain    w/ left side radiculopathy, s/p injection at L4-L5 forarmen 1/06   Uterine fibroid    No Known Allergies  Social History   Socioeconomic History   Marital status: Divorced    Spouse name: Not on file   Number of children: 3   Years of education: Not on file   Highest  education level: Not on file  Occupational History   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   Social Determinants of Health   Financial Resource Strain: Low Risk    Difficulty of Paying Living Expenses: Not hard at all  Food Insecurity: No Food Insecurity   Worried About Charity fundraiser  in the Last Year: Never true   [place] in the Last Year: Never true  Transportation Needs: No Transportation Needs   Lack of Transportation (Medical): No   Lack of Transportation (Non-Medical): No  Physical Activity: Sufficiently Active   Days of Exercise per Week: 5 days   Minutes of Exercise per Session: 30 min  Stress: No Stress Concern Present   Feeling of Stress : Not at all  Social Connections: Moderately Integrated   Frequency of Communication with Friends and Family: More than three times a week   Frequency of Social Gatherings with Friends and Family: More than three times a week   Attends Religious Services: 1 to 4 times per year   Active Member of [employer] or Organizations: Yes   Attends Archivist Meetings: 1 to 4 times per year   Marital Status: Divorced   Vitals:   02/07/21 1111  BP: 122/70  Pulse: 73  Resp: 16  Temp: 98.5 F (36.9 C)  SpO2: 98%   Body mass index is 35.54 kg/m.  Physical Exam Vitals and nursing note reviewed.  Constitutional:      General: She is not in acute distress.    Appearance: She is well-developed.  HENT:     Head: Normocephalic and atraumatic.      Mouth/Throat:     Mouth: Mucous membranes are moist.     Pharynx: Oropharynx is clear.  Eyes:     Conjunctiva/sclera: Conjunctivae normal.  Cardiovascular:     Rate and Rhythm: Normal rate and regular rhythm.     Heart sounds: No murmur heard. Pulmonary:     Effort: Pulmonary effort is normal. No respiratory distress.     Breath sounds: Normal breath sounds.  Abdominal:     Palpations: Abdomen is soft. There is no hepatomegaly or mass.     Tenderness: There is no abdominal tenderness.  Lymphadenopathy:     Cervical: No cervical adenopathy.  Skin:    General: Skin is warm.     Findings: No erythema or rash.  Neurological:     General: No focal deficit present.     Mental Status: She is alert and oriented to person, place, and time.     Cranial Nerves: No cranial nerve deficit.     Gait: Gait normal.  Psychiatric:        Mood and Affect: Mood is anxious.     Comments: Well groomed, good eye contact.   ASSESSMENT AND PLAN:  [name] was seen today for follow-up.  Diagnoses and all orders for this visit:  SOB (shortness of breath) We discussed possible etiologies. Improved after using Albuterol inh, so seems related to asthma. Instructed to resume Breo Ellipta 200-25 mcg 1 puff daily, she thinks she has one new one at home. Continue Albuterol inh 1-2 puff q 4-6 hours as needed. Instructed about warning signs.  Chest discomfort Resolved. Cardiac work up in the past otherwise negative. She has an appt with cardiologist on 02/28/21. Instructed about warning signs.  Essential hypertension BP adequately controlled today. Continue same dose of Losartan and Amlodipine. Low salt diet recommended.  Generalized anxiety disorder Stable otherwise. Some of above can be aggravated by this problem. Continue Alprazolam and Sertraline same dose.  Return if symptoms worsen or fail to improve, for keep next appt..  Ishana Blades G. Martinique, MD  [facility]. [place] office.

## 2021-02-06 NOTE — ED Provider Notes (Signed)
[facility] Provider Note   CSN: [id number] Arrival date & time: 02/05/21  1847     History Chief Complaint  Patient presents with   Chest Pain   Shortness of Breath    CAROLY [name] is a 71 y.o. female with history of asthma, hypertension, and hyperlipidemia who presents to the emergency department with an 57-month history of chest pain and shortness of breath.  He states her chest pain or shortness of breath comes on intermittently and primarily when she is exerting herself and is relieved with rest. Her most recent episode however did not follow this pattern and occurred while eating soup. She is currently asymptomatic in the emergency department.  She is not had any fevers, chills, cough, congestion, abdominal pain, nausea, vomiting, diarrhea.  Old records were reviewed which showed that she was prescribed Breo about 6 months ago from her pulmonologist for asthma which patient has not been taking.  She did have a stress test about 10 years ago.   Chest Pain Associated symptoms: shortness of breath   Shortness of Breath Associated symptoms: chest pain       Home Medications Prior to Admission medications   Medication Sig Start Date End Date Taking? Authorizing Provider  albuterol (VENTOLIN HFA) 108 (90 Base) MCG/ACT inhaler Inhale 2 puffs into the lungs every 6 (six) hours as needed for wheezing or shortness of breath. 05/30/20  Yes Hunsucker, Bonna Gains, MD  ALPRAZolam Duanne Moron) 0.25 MG tablet Take 1 tablet (0.25 mg total) by mouth at bedtime as needed for anxiety or sleep. 05/10/20  Yes Martinique, Betty G, MD  cholecalciferol (VITAMIN D) 25 MCG (1000 UNIT) tablet Take 1,000 Units by mouth daily.   Yes [provider]  fluticasone (FLONASE) 50 MCG/ACT nasal spray PLACE 1 SPRAY INTO BOTH NOSTRILS 2 (TWO) TIMES DAILY 08/19/20 02/06/21 Yes Martinique, Betty G, MD  MEGARED OMEGA-3 KRILL OIL PO Take 1 capsule by mouth daily.   Yes [provider]  naproxen sodium (ALEVE) 220 MG tablet Take 220 mg by mouth daily as needed (pain).   Yes [provider]  OVER THE COUNTER MEDICATION Take 2 tablets by mouth at bedtime. Tryptophan,magnesium,vitamin b6   Yes [provider]  amLODipine (NORVASC) 5 MG tablet TAKE 1 TABLET(5 MG) BY MOUTH EVERY MORNING Patient taking differently: Take 5 mg by mouth at bedtime. 01/24/21   Martinique, Betty G, MD  amoxicillin (AMOXIL) 500 MG tablet Take 1 tablet (500 mg total) by mouth 2 (two) times daily. Patient not taking: Reported on 02/06/2021 12/16/20   [name], MD  fluticasone furoate-vilanterol (BREO ELLIPTA) 200-25 MCG/INH AEPB Inhale 1 puff into the lungs daily. Patient not taking: Reported on 02/06/2021 05/30/20   Hunsucker, Bonna Gains, MD  losartan (COZAAR) 50 MG tablet Take 1 tablet (50 mg total) by mouth daily. 10/10/20   Leonie Man, MD  pravastatin (PRAVACHOL) 80 MG tablet Take 1 tablet (80 mg total) by mouth daily. Patient taking differently: Take 80 mg by mouth at bedtime. 06/08/20   Martinique, Betty G, MD  sertraline (ZOLOFT) 25 MG tablet Take 1 tablet (25 mg total) by mouth daily. Patient taking differently: Take 12.5 mg by mouth daily. 11/21/20   Martinique, Betty G, MD  Vitamin D, Ergocalciferol, (DRISDOL) 1.25 MG (50000 UNIT) CAPS capsule Take 1 capsule (50,000 Units total) by mouth every 7 (seven) days. Patient not taking: Reported on 02/06/2021 10/06/19   Starlyn Skeans, MD  Allergies    Patient has no known allergies.    Review of Systems   Review of Systems  Respiratory:  Positive for shortness of breath.   Cardiovascular:  Positive for chest pain.  All other systems reviewed and are negative.  Physical Exam Updated Vital Signs BP (!) 138/56    Pulse (!) 59    Temp 98.2 F (36.8 C) (Oral)    Resp 13    Ht 5\' 5"  (1.651 m)    Wt 97.5 kg    SpO2 100%    BMI 35.78 kg/m  Physical Exam Vitals and nursing note reviewed.  Constitutional:      General: She is not in  acute distress.    Appearance: Normal appearance.  HENT:     Head: Normocephalic and atraumatic.  Eyes:     General:        Right eye: No discharge.        Left eye: No discharge.  Cardiovascular:     Comments: Regular rate and rhythm.  S1/S2 are distinct without any evidence of murmur, rubs, or gallops.  Radial pulses are 2+ bilaterally.  Dorsalis pedis pulses are 2+ bilaterally.  No evidence of pedal edema. Pulmonary:     Comments: Clear to auscultation bilaterally.  Normal effort.  No respiratory distress.  No evidence of wheezes, rales, or rhonchi heard throughout. Abdominal:     General: Abdomen is flat. Bowel sounds are normal. There is no distension.     Tenderness: There is no abdominal tenderness. There is no guarding or rebound.  Musculoskeletal:        General: Normal range of motion.     Cervical back: Neck supple.  Skin:    General: Skin is warm and dry.     Findings: No rash.  Neurological:     General: No focal deficit present.     Mental Status: She is alert.  Psychiatric:        Mood and Affect: Mood normal.        Behavior: Behavior normal.    ED Results / Procedures / Treatments   Labs (all labs ordered are listed, but only abnormal results are displayed) Labs Reviewed  BASIC METABOLIC PANEL - Abnormal; Notable for the following components:      Result Value   Sodium 134 (*)    All other components within normal limits  CBC  TROPONIN I (HIGH SENSITIVITY)  TROPONIN I (HIGH SENSITIVITY)    EKG None  Radiology DG Chest 2 View  Result Date: 02/05/2021 CLINICAL DATA:  Chest pain. EXAM: CHEST - 2 VIEW COMPARISON:  Chest radiograph dated 01/26/2021. FINDINGS: No focal consolidation, pleural effusion or pneumothorax. The cardiac silhouette is within normal limits. No acute osseous pathology. IMPRESSION: No active cardiopulmonary disease. Electronically Signed   By: Anner Crete M.D.   On: 02/05/2021 20:13    Procedures Procedures   Medications  Ordered in ED Medications - No data to display  ED Course/ Medical Decision Making/ A&P Clinical Course as of 02/06/21 0902  Mon Feb 06, 2021  0727 I discussed this case with my attending physician who cosigned this note including patient's presenting symptoms, physical exam, and planned diagnostics and interventions. Attending physician stated agreement with plan or made changes to plan which were implemented.   Attending physician assessed patient at bedside.   [CF]    Clinical Course User Index [CF] Hendricks Limes, PA-C  Medical Decision Making  DEVINA BEZOLD is a 71 y.o. female with significant comorbidities affecting her care to include hypertension, asthma, and hyperlipidemia who presents to the emergency department today with ongoing intermittent chest pain and shortness of breath over the last 8 months.  Overall, physical exam and work-up is reassuring today.  Differential includes stable angina, poorly controlled asthma, ACS, and infectious etiologies.  I am suspicious that this is stable angina given the exacerbating symptoms with exertion and alleviating of her symptoms with rest.  She has not had a formal stress test in over 10 years.  This has been going on for 8 months.  Patient also states she is never been formally diagnosed with asthma but old records reviewed showed that she has been diagnosed in the past and was prescribed Breo which she has not been taking.  I believe infectious etiologies are less likely at this time given this been going on for 8 months.  Patient has a HEART pathway score of 4 given her risk factors.  Vital signs are normal appearing today.  Patient has been complaining of elevated blood pressures however her blood pressure is not significantly elevated in the department today.  We will likely have her continue taking her losartan and amlodipine and keep a blood pressure log the next time she follows up with her primary care  doctor.  Initial work-up was ordered in triage.  I personally interpreted the labs and imaging.  This includes initial and delta troponins within normal limits.  CBC and BMP without any significant abnormalities.  Chest x-ray did not show any signs of pneumonia, pneumothorax, or cardiomegaly.  I agree with the radiologist interpretation.  EKG was normal sinus rhythm.  Overall, I do believe she would benefit from outpatient follow-up with her primary care doctor and cardiology for further evaluation.  May have to get a repeat stress test in the outpatient setting and possible PFTs. I think her overall risk factors are low given the time course and symptomology.  I discussed strict return precautions at the bedside with the patient.  She expressed full understanding.  She is safe for discharge.  Social determinants of health include language barrier.  This chart was dictated using voice recognition software.  Despite best efforts to proofread,  errors can occur which can change the documentation meaning.  Final Clinical Impression(s) / ED Diagnoses Final diagnoses:  Dyspnea, unspecified type  Chest pain, unspecified type    Rx / DC Orders ED Discharge Orders     None         Hendricks Limes, [place] 02/06/21 8979    Lucrezia Starch, MD 02/07/21 2355

## 2021-02-06 NOTE — ED Notes (Signed)
Pt verbalized understanding of d/c instructions, meds and followup care. Denies questions. VSS, no distress noted. Steady gait to exit with all belongings.  ?

## 2021-02-07 ENCOUNTER — Ambulatory Visit: Payer: Medicare PPO | Admitting: Family Medicine

## 2021-02-07 VITALS — BP 122/70 | HR 73 | Temp 98.5°F | Resp 16 | Ht 65.0 in | Wt 213.6 lb

## 2021-02-07 DIAGNOSIS — R0789 Other chest pain: Secondary | ICD-10-CM | POA: Diagnosis not present

## 2021-02-07 DIAGNOSIS — F411 Generalized anxiety disorder: Secondary | ICD-10-CM

## 2021-02-07 DIAGNOSIS — I10 Essential (primary) hypertension: Secondary | ICD-10-CM | POA: Diagnosis not present

## 2021-02-07 DIAGNOSIS — R0602 Shortness of breath: Secondary | ICD-10-CM

## 2021-02-07 NOTE — Patient Instructions (Signed)
A few things to remember from today's visit:   SOB (shortness of breath)  Chest discomfort  Essential hypertension  Generalized anxiety disorder  If you need refills please call your pharmacy. Do not use My Chart to request refills or for acute issues that need immediate attention.   Start taking Breo 1 puff daily. Arrange follow up appt with pulmonologist and keep appt with cardiologist.   Please be sure medication list is accurate. If a new problem present, please set up appointment sooner than planned today.

## 2021-02-09 ENCOUNTER — Encounter: Payer: Self-pay | Admitting: Family Medicine

## 2021-02-16 DIAGNOSIS — M5459 Other low back pain: Secondary | ICD-10-CM | POA: Diagnosis not present

## 2021-02-21 NOTE — Progress Notes (Deleted)
Cardiology Office Note:    Date:  02/21/2021   ID:  Veronica, [name] 02-14-1950, MRN [id number]  PCP:  Martinique, Betty G, MD  Cardiologist:  Glenetta Hew, MD  Electrophysiologist:  None   Referring MD: Martinique, Betty G, MD   Chief Complaint: ED follow-up of chest pain and shortness of breath  History of Present Illness:    Veronica [name] is a 71 y.o. female with a history of mild non-obstructive CAD on coronary CTA in 2012, hypertension, hyperlipidemia, asthma, and anxiety who is followed by Dr. Ellyn Hack and presents today for ED follow-up of chest pain and shortness of breath.  Patient was referred to Dr. Wilhemina Cash in 11/2018 to establish care with Cardiology given family history of CAD. A prior coronary CTA in 2012 showed a coronary calcium score of 12 with mild non-obstructive CAD. She denied any chest pain or dyspnea at that visit. A coronary calcium score was ordered and came back at 4 (68th percentile for age and sex). A 2 week Zio monitor was ordered in 10/2019 for further evaluation of palpitations and showed underlying sinus rhythm with rare PACs/PVCs and a short run of paroxysmal atrial tachycardia but no sustained arrhythmias.   Patient was last seen by Dr. Ellyn Hack in 09/2020 at which time she reported some exertional dyspnea when she overdoes it as well as several episodes of chest discomfort that she described as a fleeting sharp pain worse with deep inspiration. Chest pain was felt to be musculoskeletal in nature and no further work-up was recommended.   Since last visit, patient has been seen in the ED twice. She presented to the ED on 01/26/2021 for further evaluation of chest pain but it looks like she left before labs were drawn or before being seen by a provider. She went back to the ED on 02/05/2021 for further evaluation of intermittent chest pain and shortness of breath that occurs primarily with exertion. High-sensitivity troponin was negative x2 and EKG showed no acute  ischemic changes. She was asymptomatic in the ED so was felt to be stable for discharge with outpatient Cardiology follow-up.   Patient presents today for follow-up. ***  Chest Pain  Dyspnea on Exertion Non-Obstructive CAD History of mild non-obstructive CAD noted on coronary CTA in 2012. Coronary calcium score 34 in 2020. Recently seen in the ED for intermittent chest pain and dyspnea with exertion. Work-up unremarkable. *** - Will order Echo and coronary CTA for further evaluation. *** - Not on Aspirin at this time. Depending on result from coronary CTA, may need to start.  - Continue statin.   History of Palpitations Monitor in 10/2019 showed rare PACs/PVCs and short run of PAT but no sustained arrhythmias.  - ***  Hypertension BP well controlled: - Continue current medications: Amlodipine 5mg  daily and Losartan 50mg  daily.   Hyperlipidemia Lipid panel in 09/2020: Total Cholesterol 160, Triglycerides 104, HDL 49, LDL 92. - Currently on Pravastatin 80mg  daily. OK to continue for now but think we should aim for LDL of <70 given CAD. Can wait on results from coronary CTA before making this change. ***   Past Medical History:  Diagnosis Date   Allergic rhinitis    Anxiety and depression    Constipation, chronic    Coronary artery calcification seen on CAT scan 06/2010   Mild nonobstructive CAD on cardiac CT  -as of 2021, coronary calcium score 34   Hemorrhoids    Hip pain, bilateral    Hyperlipidemia  Hypertension    Leukoplakia of vagina    Low back pain    w/ left side radiculopathy, s/p injection at L4-L5 forarmen 1/06   Uterine fibroid     Past Surgical History:  Procedure Laterality Date   ABDOMINAL HYSTERECTOMY  11-2000   no oophorectomy per pt   CHOLECYSTECTOMY  12/08/2010   Procedure: LAPAROSCOPIC CHOLECYSTECTOMY WITH INTRAOPERATIVE CHOLANGIOGRAM;  Surgeon: Pedro Earls, MD;  Location: [facility];  Service: General;  Laterality: N/A;  c-arm    COLONOSCOPY WITH  PROPOFOL N/A 02/02/2014   Procedure: COLONOSCOPY WITH PROPOFOL;  Surgeon: Garlan Fair, MD;  Location: [facility];  Service: Endoscopy;  Laterality: N/A;   CORONARY CALCIUM SCORE  12/2018   Coronary calcium score-34   CORONARY CT ANGIOGRAM  06/2010    Coronary calcium score 12.  Mild nonobstructive disease.  Ostial LAD and first D1/RI.  Right dominant.  Focal scarring and bronchiectasis of medial right lower lobe.   LEEP     unknown date    NM MYOVIEW LTD  10/2010   Nonischemic.  Normal EF.   TUBAL LIGATION     bilateral 1989   UTERINE FIBROID SURGERY      Current Medications: No outpatient medications have been marked as taking for the 02/28/21 encounter (Appointment) with Darreld Mclean, PA-C.     Allergies:   Patient has no known allergies.   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   Social Determinants of Health   Financial Resource Strain: Low Risk    Difficulty of Paying Living Expenses: Not hard at all  Food Insecurity: No Food Insecurity   Worried About Charity fundraiser in the Last Year: Never true   [place] in the Last Year: Never true  Transportation Needs: No Transportation Needs   Lack of Transportation (Medical): No   Lack of Transportation (Non-Medical): No   Physical Activity: Sufficiently Active   Days of Exercise per Week: 5 days   Minutes of Exercise per Session: 30 min  Stress: No Stress Concern Present   Feeling of Stress : Not at all  Social Connections: Moderately Integrated   Frequency of Communication with Friends and Family: More than three times a week   Frequency of Social Gatherings with Friends and Family: More than three times a week   Attends Religious Services: 1 to 4 times per year   Active Member of [employer] or Organizations: Yes   Attends Archivist Meetings: 1 to 4 times per year   Marital Status: Divorced     Family History: The patient's family history includes AAA (abdominal aortic aneurysm) (age of onset: 9) in her father;  Anxiety disorder in her mother; CVA in her brother; Heart attack (age of onset: 46) in her mother; Heart disease in her brother, father, and mother; Hyperlipidemia in her father and mother; Hypertension in her father and mother; Schizophrenia in her brother, son, and another family member; Valvular heart disease in her brother. There is no history of Diabetes, Colon cancer, or Breast cancer.  ROS:   Please see the history of present illness.     EKGs/Labs/Other Studies Reviewed:    The following studies were reviewed today:  Coronary CTA 07/27/2010: Impressions: 1.  Mild nonobstructive coronary artery disease.  The patient's  total coronary artery calcium score is 12.62, which is 75th  percentile for patient's matched age and gender.  2.  Nonobstructive calcified plaques at the origin of the LAD, the  proximal LAD, and in the midportion of a large first diagonal/ramus  branch.  3.  Right coronary artery dominance.  4.  Focal area of scarring and bronchiectasis medially in the right  lower lobe.  No other significant non-cardiac findings. _______________  CT Cardiac Scoring 01/02/2019: Coronary calcium score of 34. This was 68th percentile for age and sex matched  control. _______________  Chauncy Passy 11/27/2019 to 12/10/2019: Sinus rhythm: Minimum heart rate 55 bpm, maximum 126 bpm with an average of 73 bpm. Rare (< 1%) isolated PACs and PVCs with the very rare PAC couplets. 1 PAC quadruplet-officially short burst of PAT-rate 148 bpm with an average rate of 124 bpm. No sustained arrhythmias or significant bradycardia. 4 patient triggers with sinus rhythm, no diary entries.  EKG:  EKG not ordered today.   Recent Labs: 10/13/2020: ALT 13; TSH 1.040 02/05/2021: BUN 9; Creatinine, Ser 0.75; Hemoglobin 13.2; Platelets 270; Potassium 3.8; Sodium 134  Recent Lipid Panel    Component Value Date/Time   CHOL 160 10/13/2020 1003   TRIG 104 10/13/2020 1003   HDL 49 10/13/2020 1003   CHOLHDL 3.3 10/13/2020 1003   CHOLHDL 3.8 08/28/2019 1048   VLDL 33.2 10/07/2018 1103   LDLCALC 92 10/13/2020 1003   LDLCALC 132 (H) 08/28/2019 1048   LDLDIRECT 161.6 06/03/2012 1043    Physical Exam:    Vital Signs: There were no vitals taken for this visit.    Wt Readings from Last 3 Encounters:  02/07/21 213 lb 9.6 oz (96.9 kg)  02/05/21 215 lb (97.5 kg)  11/21/20 217 lb (98.4 kg)     General: 71 y.o. female in no acute distress. HEENT: Normocephalic and atraumatic. Sclera clear. EOMs intact. Neck: Supple. No carotid bruits. No JVD. Heart: *** RRR. Distinct S1 and S2. No murmurs, gallops, or rubs. Radial and distal pedal pulses 2+ and equal bilaterally. Lungs: No increased work of breathing. Clear to ausculation bilaterally. No wheezes, rhonchi, or rales.  Abdomen: Soft, non-distended, and non-tender to palpation. Bowel sounds present in all 4 quadrants.  MSK: Normal strength and tone for age. *** Extremities: No lower extremity edema.    Skin: Warm and dry. Neuro: Alert and oriented x3. No focal deficits. Psych: Normal affect. Responds appropriately.   Assessment:    No diagnosis found.  Plan:     Disposition: Follow up in ***   Medication  Adjustments/Labs and Tests Ordered: Current medicines are reviewed at length with the patient today.  Concerns regarding medicines are outlined above.  No orders of the defined types were placed in this encounter.  No orders of the defined types were placed in this encounter.   There are no Patient Instructions  on file for this visit.   Signed, Darreld Mclean, PA-C  02/21/2021 10:58 AM    [facility]

## 2021-02-27 NOTE — Progress Notes (Signed)
Office Visit    Patient Name: Veronica [name] Date of Encounter: 02/28/2021  Primary Care Provider:  Martinique, Betty G, MD Primary Cardiologist:  Glenetta Hew, MD  Chief Complaint    71 year old-year-old female with a history of mild nonobstructive CAD on coronary CTA in 2012, hypertension, hyperlipidemia, asthma, and anxiety who presents for follow-up related to chest pain and shortness of breath.  Past Medical History    Past Medical History:  Diagnosis Date   Allergic rhinitis    Anxiety and depression    Constipation, chronic    Coronary artery calcification seen on CAT scan 06/2010   Mild nonobstructive CAD on cardiac CT  -as of 2021, coronary calcium score 34   Hemorrhoids    Hip pain, bilateral    Hyperlipidemia    Hypertension    Leukoplakia of vagina    Low back pain    w/ left side radiculopathy, s/p injection at L4-L5 forarmen 1/06   Uterine fibroid    Past Surgical History:  Procedure Laterality Date   ABDOMINAL HYSTERECTOMY  11-2000   no oophorectomy per pt   CHOLECYSTECTOMY  12/08/2010   Procedure: LAPAROSCOPIC CHOLECYSTECTOMY WITH INTRAOPERATIVE CHOLANGIOGRAM;  Surgeon: Pedro Earls, MD;  Location: [facility];  Service: General;  Laterality: N/A;  c-arm    COLONOSCOPY WITH PROPOFOL N/A 02/02/2014   Procedure: COLONOSCOPY WITH PROPOFOL;  Surgeon: Garlan Fair, MD;  Location: [facility];  Service: Endoscopy;  Laterality: N/A;   CORONARY CALCIUM SCORE  12/2018   Coronary calcium score-34   CORONARY CT ANGIOGRAM  06/2010    Coronary calcium score 12.  Mild nonobstructive disease.  Ostial LAD and first D1/RI.  Right dominant.  Focal scarring and bronchiectasis of medial right lower lobe.   LEEP     unknown date    NM MYOVIEW LTD  10/2010   Nonischemic.  Normal EF.   TUBAL LIGATION     bilateral 1989   UTERINE FIBROID SURGERY      Allergies  No Known Allergies  History of Present Illness    71 year old-year-old female with the above past medical  history including mild nonobstructive CAD on coronary CTA in 2012, hypertension, hyperlipidemia, asthma, and anxiety.  She established care with Dr. Ellyn Hack in 2020 in the setting of strong family history of CAD. Coronary CTA in 2012 showed calcium score of 12, mild nonobstructive CAD.  Follow-up coronary calcium score in 2020 was 62 (68th percentile for age and sex). Outpatient Monitor in October 2021 in the setting of palpitations showed predominantly sinus rhythm, rare PACs/PVCs, short runs of PAT, no significant arrhythmia.  She was last seen in the office in September 2022 and reported some exertional dyspnea and chest discomfort. Her chest pain was felt to be musculoskeletal at the time and no further work-up was recommended. Unfortunately, she has been in the ED twice since that time with complaints of intermittent chest pain and shortness of breath, initially on 01/26/2021 (she left before having lab work done) and again on 02/05/2021.  Troponin was negative, EKG was unremarkable.  Given she ruled out for ACS , and improvement of symptoms, she was discharged home in stable condition and advised to follow-up with cardiology as an outpatient.  She presents today for follow-up. Since her ED visit she continues to have intermittent chest discomfort. She describes it as a pressure in her left chest that radiates to her left arm most often in the setting of emotional stress and anxiety. She notes her  symptoms both at rest and with activity. Initially, her symptoms of chest discomfort occurred when she was at her daughter's house.  She states she is allergic to her daughter's house, she thinks this may have triggered her symptoms; she plans to follow-up with an allergist. She also reports mild dyspnea on exertion. She has an appointment scheduled to follow-up with pulmonology as she does have a history of asthma and her shortness of breath has seemed to improve with inhalers. Lastly, she is concerned that she  has had isolated elevated blood pressure readings at home with SBP in the 170s-180s. She states her PCP told her to stop taking her pravastatin and that her cholesterol was at goal.  Other than her chest discomfort, elevated blood pressure, and shortness of breath, she has no other concerns or complaints today.  Home Medications    Current Outpatient Medications  Medication Sig Dispense Refill   albuterol (VENTOLIN HFA) 108 (90 Base) MCG/ACT inhaler Inhale 2 puffs into the lungs every 6 (six) hours as needed for wheezing or shortness of breath. 1 each 11   ALPRAZolam (XANAX) 0.25 MG tablet Take 1 tablet (0.25 mg total) by mouth at bedtime as needed for anxiety or sleep. 30 tablet 1   amLODipine (NORVASC) 5 MG tablet TAKE 1 TABLET(5 MG) BY MOUTH EVERY MORNING (Patient taking differently: Take 5 mg by mouth at bedtime.) 90 tablet 1   cholecalciferol (VITAMIN D) 25 MCG (1000 UNIT) tablet Take 1,000 Units by mouth daily.     fluticasone (FLONASE) 50 MCG/ACT nasal spray PLACE 1 SPRAY INTO BOTH NOSTRILS 2 (TWO) TIMES DAILY 16 mL 2   fluticasone furoate-vilanterol (BREO ELLIPTA) 200-25 MCG/INH AEPB Inhale 1 puff into the lungs daily. 60 each 11   losartan (COZAAR) 50 MG tablet Take 1 tablet (50 mg total) by mouth daily. 90 tablet 3   MEGARED OMEGA-3 KRILL OIL PO Take 1 capsule by mouth daily.     metoprolol tartrate (LOPRESSOR) 100 MG tablet Take tablet by mouth 2 hours prior to Coronary CT 1 tablet 0   naproxen sodium (ALEVE) 220 MG tablet Take 220 mg by mouth daily as needed (pain).     OVER THE COUNTER MEDICATION Take 2 tablets by mouth at bedtime. Tryptophan,magnesium,vitamin b6     rosuvastatin (CRESTOR) 20 MG tablet Take 1 tablet (20 mg total) by mouth daily. 90 tablet 3   No current facility-administered medications for this visit.     Review of Systems    She denies pnd, orthopnea, n, v, dizziness, syncope, edema, weight gain, or early satiety. All other systems reviewed and are otherwise  negative except as noted above.   Physical Exam    VS:  BP 124/70    Pulse 68    Ht 5\' 5"  (1.651 m)    Wt 208 lb 6.4 oz (94.5 kg)    SpO2 97%    BMI 34.68 kg/m  GEN: Well nourished, well developed, in no acute distress. HEENT: normal. Neck: Supple, no JVD, carotid bruits, or masses. Cardiac: RRR, no murmurs, rubs, or gallops. No clubbing, cyanosis, edema.  Radials/DP/PT 2+ and equal bilaterally.  Respiratory:  Respirations regular and unlabored, clear to auscultation bilaterally. GI: Soft, nontender, nondistended, BS + x 4. MS: no deformity or atrophy. Skin: warm and dry, no rash. Neuro:  Strength and sensation are intact. Psych: Normal affect.  Accessory Clinical Findings    ECG personally reviewed by me today - No EKG in office today - no acute changes.  Lab Results  Component Value Date   WBC 6.3 02/05/2021   HGB 13.2 02/05/2021   HCT 38.1 02/05/2021   MCV 87.4 02/05/2021   PLT 270 02/05/2021   Lab Results  Component Value Date   CREATININE 0.75 02/05/2021   BUN 9 02/05/2021   NA 134 (L) 02/05/2021   K 3.8 02/05/2021   CL 100 02/05/2021   CO2 27 02/05/2021   Lab Results  Component Value Date   ALT 13 10/13/2020   AST 17 10/13/2020   ALKPHOS 84 10/13/2020   BILITOT 0.4 10/13/2020   Lab Results  Component Value Date   CHOL 160 10/13/2020   HDL 49 10/13/2020   LDLCALC 92 10/13/2020   LDLDIRECT 161.6 06/03/2012   TRIG 104 10/13/2020   CHOLHDL 3.3 10/13/2020    Lab Results  Component Value Date   HGBA1C 5.6 10/13/2020    Assessment & Plan    1. Mild nonobstructive CAD/chest pain/shortness of breath: Coronary CTA in 2012 showed calcium score of 12, mild nonobstructive CAD.  Follow-up coronary calcium score in 2020 was 46 (68th percentile for age and sex).  She has had recent intermittent left chest discomfort that radiates to her left arm largely associated with emotional stress and anxiety.  EKG, troponin, CBC, and BMET unremarkable during recent ED visit on  02/05/2021. She does have some dyspnea on exertion which has improved with inhalers. She is scheduled to follow-up with pulmonology to assess for possible lung etiology. Given new onset of symptoms and strong family history of CAD, will repeat coronary CTA for further ischemic evaluation. Will check echo with new dyspnea. Discussed possible anxiety contributing to her symptoms, recommend follow-up with PCP for same.   2. Palpitations: Monitor in October 2021 in the setting of palpitations showed predominantly sinus rhythm, rare PACs/PVCs, short runs of PAT, no significant arrhythmia.  He reports rare fleeting palpitations with no associated symptoms.  Stable.   3. Hypertension: BP well controlled in office today though she reports elevated BP readings at home. Will have her bring her home BP cuff to her next appointment for calibration. Continue current antihypertensive regimen.   4. Hyperlipidemia: LDL 92 in 09/2020.  She states she was told by her PCP to no longer take pravastatin as she did not need it.  The 10-year ASCVD risk score (Arnett DK, et al., 2019) is: 10.2%.  We will start Crestor 20 mg daily.  Check lipids, LFTs in 6 to 8 weeks.  5. Disposition: Follow-up in 1 to 2 months.  Lenna Sciara, NP 02/28/2021, 2:58 PM

## 2021-02-28 ENCOUNTER — Encounter: Payer: Self-pay | Admitting: Student

## 2021-02-28 ENCOUNTER — Other Ambulatory Visit: Payer: Self-pay

## 2021-02-28 ENCOUNTER — Ambulatory Visit: Payer: Medicare PPO | Admitting: Nurse Practitioner

## 2021-02-28 VITALS — BP 124/70 | HR 68 | Ht 65.0 in | Wt 208.4 lb

## 2021-02-28 DIAGNOSIS — R002 Palpitations: Secondary | ICD-10-CM | POA: Diagnosis not present

## 2021-02-28 DIAGNOSIS — Z01818 Encounter for other preprocedural examination: Secondary | ICD-10-CM

## 2021-02-28 DIAGNOSIS — I10 Essential (primary) hypertension: Secondary | ICD-10-CM | POA: Diagnosis not present

## 2021-02-28 DIAGNOSIS — R0602 Shortness of breath: Secondary | ICD-10-CM | POA: Diagnosis not present

## 2021-02-28 DIAGNOSIS — I2510 Atherosclerotic heart disease of native coronary artery without angina pectoris: Secondary | ICD-10-CM | POA: Diagnosis not present

## 2021-02-28 DIAGNOSIS — E785 Hyperlipidemia, unspecified: Secondary | ICD-10-CM | POA: Diagnosis not present

## 2021-02-28 DIAGNOSIS — R072 Precordial pain: Secondary | ICD-10-CM

## 2021-02-28 DIAGNOSIS — M5459 Other low back pain: Secondary | ICD-10-CM | POA: Diagnosis not present

## 2021-02-28 MED ORDER — METOPROLOL TARTRATE 100 MG PO TABS: ORAL_TABLET | ORAL | 0 refills | Status: DC

## 2021-02-28 MED ORDER — ROSUVASTATIN CALCIUM 20 MG PO TABS: 20.0000 mg | ORAL_TABLET | Freq: Every day | ORAL | 3 refills | Status: DC

## 2021-02-28 NOTE — Patient Instructions (Addendum)
Medication Instructions:  STOP Pravachol (Pravastatin)  START Rosuvastatin (Crestor) 20 mg daily TAKE Metoprolol Tartrate (Lopressor) 100 mg 2 hours prior to Coronary CT  *If you need a refill on your cardiac medications before your next appointment, please call your pharmacy*  Lab Work: Your physician recommends that you return for lab work in 6-8 WEEKS:  Fasting Lipid Panel-DO NOT EAT OR DRINK PAST MIDNIGHT Hepatic (Liver) Function Test   If you have labs (blood work) drawn today and your tests are completely normal, you will receive your results only by: [place] (if you have MyChart) OR A paper copy in the mail If you have any lab test that is abnormal or we need to change your treatment, we will call you to review the results.  Testing/Procedures: Your physician has requested that you have an echocardiogram. Echocardiography is a painless test that uses sound waves to create images of your heart. It provides your doctor with information about the size and shape of your heart and how well your hearts chambers and valves are working. This procedure takes approximately one hour. There are no restrictions for this procedure.  Your physician has requested that you have cardiac CT. Cardiac computed tomography (CT) is a painless test that uses an x-ray machine to take clear, detailed pictures of your heart. For further information please visit [facility]tn. Please follow instruction sheet as given.  Follow-Up: At [facility], you and your health needs are our priority.  As part of our continuing mission to provide you with exceptional heart care, we have created designated Provider Care Teams.  These Care Teams include your primary Cardiologist (physician) and Advanced Practice Providers (APPs -  Physician Assistants and Nurse Practitioners) who all work together to provide you with the care you need, when you need it.  Your next appointment:   1-2  month(s)  The format  for your next appointment:   In Person  Provider:   Diona Browner, NP        Other Instructions    Your cardiac CT will be scheduled at one of the below locations:   [facility] [address] [place], [place] [address] [phone]  [facility] [address] [place], [place] [address] [phone]  If scheduled at [facility], please arrive at the [facility] main entrance (entrance A) of [facility] 30 minutes prior to test start time. You can use the FREE valet parking offered at the main entrance (encouraged to control the heart rate for the test) Proceed to the [facility] Radiology Department (first floor) to check-in and test prep.  If scheduled at [facility], please arrive 15 mins early for check-in and test prep.  Please follow these instructions carefully (unless otherwise directed):  On the Night Before the Test: Be sure to Drink plenty of water. Do not consume any caffeinated/decaffeinated beverages or chocolate 12 hours prior to your test. Do not take any antihistamines 12 hours prior to your test. If the patient has contrast allergy: Patient will need a prescription for Prednisone and very clear instructions (as follows): Prednisone 50 mg - take 13 hours prior to test Take another Prednisone 50 mg 7 hours prior to test Take another Prednisone 50 mg 1 hour prior to test Take Benadryl 50 mg 1 hour prior to test Patient must complete all four doses of above prophylactic medications. Patient will need a ride after test due to Benadryl.  On the Day of the Test: Drink plenty of water until 1 hour prior to the test. Do not eat any food 4 hours prior to the test. You may take your regular medications prior to the test.  Take metoprolol (Lopressor) two hours prior to test. HOLD Furosemide/Hydrochlorothiazide morning of the test. FEMALES- please wear  underwire-free bra if available, avoid dresses & tight clothing  After the Test: Drink plenty of water. After receiving IV contrast, you may experience a mild flushed feeling. This is normal. On occasion, you may experience a mild rash up to 24 hours after the test. This is not dangerous. If this occurs, you can take Benadryl 25 mg and increase your fluid intake. If you experience trouble breathing, this can be serious. If it is severe call 911 IMMEDIATELY. If it is mild, please call our office. If you take any of these medications: Glipizide/Metformin, Avandament, Glucavance, please do not take 48 hours after completing test unless otherwise instructed.  We will call to schedule your test 2-4 weeks out understanding that some insurance companies will need an authorization prior to the service being performed.   For non-scheduling related questions, please contact the cardiac imaging nurse navigator should you have any questions/concerns: Marchia Bond, Cardiac Imaging Nurse Navigator Gordy Clement, Cardiac Imaging Nurse Navigator [facility] Direct Office Dial: [phone]   For scheduling needs, including cancellations and rescheduling, please call [place], [phone].

## 2021-03-01 ENCOUNTER — Encounter: Payer: Self-pay | Admitting: Pulmonary Disease

## 2021-03-01 ENCOUNTER — Ambulatory Visit: Payer: Medicare PPO | Admitting: Pulmonary Disease

## 2021-03-01 VITALS — BP 138/76 | HR 79 | Temp 98.2°F | Ht 65.0 in | Wt 207.2 lb

## 2021-03-01 DIAGNOSIS — R0789 Other chest pain: Secondary | ICD-10-CM

## 2021-03-01 DIAGNOSIS — J4530 Mild persistent asthma, uncomplicated: Secondary | ICD-10-CM | POA: Diagnosis not present

## 2021-03-01 LAB — BASIC METABOLIC PANEL
BUN/Creatinine Ratio: 21 (ref 12–28)
BUN: 16 mg/dL (ref 8–27)
CO2: 22 mmol/L (ref 20–29)
Calcium: 9.7 mg/dL (ref 8.7–10.3)
Chloride: 102 mmol/L (ref 96–106)
Creatinine, Ser: 0.75 mg/dL (ref 0.57–1.00)
Glucose: 85 mg/dL (ref 70–99)
Potassium: 4.2 mmol/L (ref 3.5–5.2)
Sodium: 137 mmol/L (ref 134–144)
eGFR: 86 mL/min/{1.73_m2} (ref >59)

## 2021-03-01 NOTE — Patient Instructions (Addendum)
Nice to see you again!  Continue Using Breo one puff each day, every day. Rinse mouth after every use.  Use albuterol every 4 hours as needed for chest tightness, pain, or shortness of breath.   Follow up with the studies and appointments with heart doctors  If everything checks out ok with the heart, we could consider starting a pill for stomach acid.   Return to clinic in 3 months or sooner as needed

## 2021-03-01 NOTE — Progress Notes (Signed)
$'@Patient'm$  ID: Veronica [name], female    DOB: 08/27/1950, 71 y.o.   MRN: [id number]  Chief Complaint  Patient presents with   Hospitalization [facility] follow up from December. She was seen in the hospital for SOB, chest pain and HTN and pain in her upper back.     Referring provider: Martinique, Betty G, MD  HPI:   71 y.o. whom we are seeing in follow-up after recent ED visit.  ED note reviewed.  Most recent PCP note reviewed.  Most recent cardiology note reviewed.  Patient developed worsening dyspnea over the last several weeks.  Associated chest pain or pressure.  Central.  Radiates to back.  Relieved with rest.  She has not been using her Norco as prescribed.  Went to the ED.  Chest x-ray on my review interpretation was clear.  EKG reported okay.  Troponins okay on review.  She was referred to cardiology and pulmonary for further evaluation.  Over the last 2 weeks, she has resumed Breo.  This is greatly improved her dyspnea on exertion.  Also has improved the severity and frequency of chest discomfort she described above.  She has ongoing ischemic work-up with cardiology planned.  HPI initial visit: Patient states he was diagnosed with asthma when she lived in [place] many years ago.  This is often exacerbated by the smog, wildfires.  She has bad seasonal allergies and was treated for a long time with allergy shots.  She did use inhaler at the time but states it was only used as needed.  She thinks it is albuterol after being question.  She moved to [place] and lived there for a while and had similar but more mild symptoms.  Continue to receive allergy shots.  Supple he moved to [place] many years ago and since then has had similar symptoms but even milder still.  No longer receiving allergy shots.  Breathing gets worse in the spring with emergence of a lot of pollen.  No timing during the day when things are better or worse.  No environmental changes to account for her  symptoms.  No positional changes with things are better or worse.  She has not been using inhalers in some time.  No other alleviating or exacerbating factors.  Symptoms described as moderate to severe.  Most recent chest imaging chest x-ray in 2019 was personally reviewed and interpreted as clear lungs with mild hyperinflation with flattening of the diaphragms medially as well as increased airspace lucency between anterior chest wall and heart.  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  MMRC: mMRC Dyspnea Scale mMRC Score  05/30/2020 2    Epworth:  No flowsheet data found.  Tests:   FENO:  No results found for: NITRICOXIDE  PFT: No flowsheet data found.  WALK:  No flowsheet data found.  Imaging: Personally reviewed and as per EMR and discussion in this note  Lab Results: Personally reviewed, most recent eosinophil count of 200 CBC    Component Value Date/Time   WBC 6.3 02/05/2021 1940   RBC 4.36 02/05/2021 1940   HGB 13.2 02/05/2021 1940   HGB 13.5 10/13/2020 1003   HCT 38.1 02/05/2021 1940   HCT 40.3 10/13/2020 1003  PLT 270 02/05/2021 1940   PLT 239 10/13/2020 1003   MCV 87.4 02/05/2021 1940   MCV 88 10/13/2020 1003   MCH 30.3 02/05/2021 1940   MCHC 34.6 02/05/2021 1940   RDW 13.0 02/05/2021 1940   RDW 14.0 10/13/2020 1003   LYMPHSABS 2.2 09/22/2019 0820   MONOABS 0.3 12/21/2015 0849   EOSABS 0.2 09/22/2019 0820   BASOSABS 0.1 09/22/2019 0820    BMET    Component Value Date/Time   NA 137 02/28/2021 1535   K 4.2 02/28/2021 1535   CL 102 02/28/2021 1535   CO2 22 02/28/2021 1535   GLUCOSE 85 02/28/2021 1535    GLUCOSE 98 02/05/2021 1940   BUN 16 02/28/2021 1535   CREATININE 0.75 02/28/2021 1535   CREATININE 0.78 08/28/2019 1048   CALCIUM 9.7 02/28/2021 1535   GFRNONAA >60 02/05/2021 1940   GFRNONAA 83 10/24/2014 1654   GFRAA >60 10/26/2019 2107   GFRAA >89 10/24/2014 1654    BNP No results found for: BNP  ProBNP No results found for: PROBNP  Specialty Problems       Pulmonary Problems   Allergic rhinitis    Qualifier: Diagnosis of  By: End MD, Harrell Gave         No Known Allergies  Immunization History  Administered Date(s) Administered   Hepatitis A, Adult 10/26/2015   Influenza Whole 10/07/2009   MMR 10/26/2015   PFIZER Comirnaty(Gray Top)Covid-19 Tri-Sucrose Vaccine 02/10/2020   PFIZER(Purple Top)SARS-COV-2 Vaccination 04/10/2019, 05/05/2019, 08/30/2019   Pneumococcal Conjugate-13 01/06/2018   Pneumococcal Polysaccharide-23 08/28/2019   Tdap 10/24/2014   Zoster, Live 09/01/2012    Past Medical History:  Diagnosis Date   Allergic rhinitis    Anxiety and depression    Constipation, chronic    Coronary artery calcification seen on CAT scan 06/2010   Mild nonobstructive CAD on cardiac CT  -as of 2021, coronary calcium score 34   Hemorrhoids    Hip pain, bilateral    Hyperlipidemia    Hypertension    Leukoplakia of vagina    Low back pain    w/ left side radiculopathy, s/p injection at L4-L5 forarmen 1/06   Uterine fibroid     Tobacco History: Social History   Tobacco Use  Smoking Status Never  Smokeless Tobacco Never   Counseling given: Not Answered   Continue to not smoke  Outpatient Encounter Medications as of 03/01/2021  Medication Sig   albuterol (VENTOLIN HFA) 108 (90 Base) MCG/ACT inhaler Inhale 2 puffs into the lungs every 6 (six) hours as needed for wheezing or shortness of breath.   ALPRAZolam (XANAX) 0.25 MG tablet Take 1 tablet (0.25 mg total) by mouth at bedtime as needed for anxiety or sleep.   amLODipine (NORVASC) 5 MG tablet TAKE 1  TABLET(5 MG) BY MOUTH EVERY MORNING (Patient taking differently: Take 5 mg by mouth at bedtime.)   cholecalciferol (VITAMIN D) 25 MCG (1000 UNIT) tablet Take 1,000 Units by mouth daily.   fluticasone furoate-vilanterol (BREO ELLIPTA) 200-25 MCG/INH AEPB Inhale 1 puff into the lungs daily.   losartan (COZAAR) 50 MG tablet Take 1 tablet (50 mg total) by mouth daily.   MEGARED OMEGA-3 KRILL OIL PO Take 1 capsule by mouth daily.   metoprolol tartrate (LOPRESSOR) 100 MG tablet Take tablet by mouth 2 hours prior to Coronary CT   naproxen sodium (ALEVE) 220 MG tablet Take 220 mg by mouth daily as needed (pain).   OVER THE COUNTER MEDICATION Take 2 tablets by mouth at bedtime.  Tryptophan,magnesium,vitamin b6   rosuvastatin (CRESTOR) 20 MG tablet Take 1 tablet (20 mg total) by mouth daily.   fluticasone (FLONASE) 50 MCG/ACT nasal spray PLACE 1 SPRAY INTO BOTH NOSTRILS 2 (TWO) TIMES DAILY   No facility-administered encounter medications on file as of 03/01/2021.     Review of Systems  Review of Systems  N/a  Physical Exam  BP 138/76 (BP Location: Left Arm, Patient Position: Sitting, Cuff Size: Normal)    Pulse 79    Temp 98.2 F (36.8 C) (Oral)    Ht $R'5\' 5"'ON$  (1.651 m)    Wt 207 lb 3.2 oz (94 kg)    SpO2 98%    BMI 34.48 kg/m   Wt Readings from Last 5 Encounters:  03/01/21 207 lb 3.2 oz (94 kg)  02/28/21 208 lb 6.4 oz (94.5 kg)  02/07/21 213 lb 9.6 oz (96.9 kg)  02/05/21 215 lb (97.5 kg)  11/21/20 217 lb (98.4 kg)    BMI Readings from Last 5 Encounters:  03/01/21 34.48 kg/m  02/28/21 34.68 kg/m  02/07/21 35.54 kg/m  02/05/21 35.78 kg/m  11/21/20 36.11 kg/m     Physical Exam General: Well-appearing, in no acute distress Eyes: EOMI, no icterus Neck: Supple no JVP Cardiovascular: Regular rate and rhythm, no murmur Pulmonary: Clear to auscultation bilaterally, no wheeze, normal work of breathing MSK: No synovitis, no joint effusion Neuro: Normal gait, no weakness Psych: Normal,  full affect   Assessment & Plan:   Dyspnea on exertion: Similar but more severe while in [place] and attributed to allergies, asthma.  Improved but present when lived in [place].  Historically improved with Breo.  Stopped and worsens.  Again better with Breo.  Counseled to continue Breo as below.  Asthma: Diagnosed in the past.  Atopic symptoms, at times severe seasonal allergies requiring allergy shots.  Overall symptoms improved with Breo.  She stopped this for period of time and symptoms worsened.  She has resumed Breo with again better control of symptoms.  To continue Breo daily, recommend albuterol use as needed for shortness of breath chest discomfort etc.  Chest pain: Central, worse with exertion, relieved with rest.  Radiates to back.  Suspect element of bronchoconstriction as has improved in severity and frequency with resumption of Breo.  Ongoing ischemic work-up per cardiology.  Consider PPI in the future if cardiology work-up is reassuring as she points to her esophagus and mentions her esophagus in terms of location of pain.  Denies heartburn.   Return in about 3 months (around 05/29/2021).   Lanier Clam, MD 03/01/2021

## 2021-03-07 DIAGNOSIS — L570 Actinic keratosis: Secondary | ICD-10-CM | POA: Diagnosis not present

## 2021-03-07 DIAGNOSIS — X32XXXA Exposure to sunlight, initial encounter: Secondary | ICD-10-CM | POA: Diagnosis not present

## 2021-03-07 DIAGNOSIS — L700 Acne vulgaris: Secondary | ICD-10-CM | POA: Diagnosis not present

## 2021-03-08 ENCOUNTER — Ambulatory Visit (HOSPITAL_COMMUNITY): Payer: Medicare PPO | Attending: Cardiology

## 2021-03-08 ENCOUNTER — Other Ambulatory Visit: Payer: Self-pay

## 2021-03-08 DIAGNOSIS — M5459 Other low back pain: Secondary | ICD-10-CM | POA: Diagnosis not present

## 2021-03-08 DIAGNOSIS — R072 Precordial pain: Secondary | ICD-10-CM | POA: Diagnosis not present

## 2021-03-08 DIAGNOSIS — R0602 Shortness of breath: Secondary | ICD-10-CM | POA: Diagnosis not present

## 2021-03-08 LAB — ECHOCARDIOGRAM COMPLETE
Area-P 1/2: 3.31 cm2
P 1/2 time: 1139 msec
S' Lateral: 2.65 cm

## 2021-03-09 ENCOUNTER — Telehealth (HOSPITAL_COMMUNITY): Payer: Self-pay | Admitting: *Deleted

## 2021-03-09 DIAGNOSIS — E785 Hyperlipidemia, unspecified: Secondary | ICD-10-CM | POA: Diagnosis not present

## 2021-03-09 LAB — HEPATIC FUNCTION PANEL
ALT: 12 IU/L (ref 0–32)
AST: 16 IU/L (ref 0–40)
Albumin: 4.7 g/dL (ref 3.8–4.8)
Alkaline Phosphatase: 80 IU/L (ref 44–121)
Bilirubin Total: 0.4 mg/dL (ref 0.0–1.2)
Bilirubin, Direct: 0.15 mg/dL (ref 0.00–0.40)
Total Protein: 7.3 g/dL (ref 6.0–8.5)

## 2021-03-09 LAB — LIPID PANEL
Chol/HDL Ratio: 2.8 ratio (ref 0.0–4.4)
Cholesterol, Total: 141 mg/dL (ref 100–199)
HDL: 50 mg/dL (ref >39)
LDL Chol Calc (NIH): 74 mg/dL (ref 0–99)
Triglycerides: 90 mg/dL (ref 0–149)
VLDL Cholesterol Cal: 17 mg/dL (ref 5–40)

## 2021-03-09 NOTE — Telephone Encounter (Signed)
Reaching out to patient to offer assistance regarding upcoming cardiac imaging study; pt verbalizes understanding of appt date/time, parking situation and where to check in, pre-test NPO status and medications ordered, and verified current allergies; name and call back number provided for further questions should they arise  Gordy Clement RN Navigator Cardiac Imaging [facility] [phone] office [phone] cell  Patient to take 100mg  metoprolol tartrate two hours prior to her cardiac CT scan. She states she would like to take her Xanax for test prep and states her son will be bringing her. She is aware to arrive at 3:30pm for her 4pm scan.

## 2021-03-10 ENCOUNTER — Other Ambulatory Visit: Payer: Self-pay

## 2021-03-10 ENCOUNTER — Ambulatory Visit (HOSPITAL_COMMUNITY)
Admission: RE | Admit: 2021-03-10 | Discharge: 2021-03-10 | Disposition: A | Discharge status: Home / Self Care | Payer: Medicare PPO | Source: Ambulatory Visit | Attending: Nurse Practitioner | Admitting: Nurse Practitioner

## 2021-03-10 DIAGNOSIS — R0602 Shortness of breath: Secondary | ICD-10-CM | POA: Insufficient documentation

## 2021-03-10 DIAGNOSIS — R072 Precordial pain: Secondary | ICD-10-CM | POA: Insufficient documentation

## 2021-03-10 MED ORDER — NITROGLYCERIN 0.4 MG SL SUBL
0.8000 mg | SUBLINGUAL_TABLET | Freq: Once | SUBLINGUAL | Status: AC
  Administered 2021-03-10: 0.8 mg via SUBLINGUAL

## 2021-03-10 MED ORDER — IOHEXOL 350 MG/ML SOLN
100.0000 mL | Freq: Once | INTRAVENOUS | Status: AC | PRN
  Administered 2021-03-10: 100 mL via INTRAVENOUS

## 2021-03-10 MED ORDER — NITROGLYCERIN 0.4 MG SL SUBL
SUBLINGUAL_TABLET | SUBLINGUAL | Status: AC
  Filled 2021-03-10: qty 2

## 2021-03-14 ENCOUNTER — Other Ambulatory Visit: Payer: Self-pay

## 2021-03-14 DIAGNOSIS — I10 Essential (primary) hypertension: Secondary | ICD-10-CM

## 2021-03-14 DIAGNOSIS — E785 Hyperlipidemia, unspecified: Secondary | ICD-10-CM

## 2021-03-14 DIAGNOSIS — Z79899 Other long term (current) drug therapy: Secondary | ICD-10-CM

## 2021-03-16 ENCOUNTER — Encounter: Payer: Self-pay | Admitting: *Deleted

## 2021-03-30 ENCOUNTER — Ambulatory Visit: Payer: Medicare PPO | Admitting: Allergy

## 2021-04-25 ENCOUNTER — Other Ambulatory Visit: Payer: Self-pay

## 2021-04-25 ENCOUNTER — Encounter: Payer: Self-pay | Admitting: Nurse Practitioner

## 2021-04-25 ENCOUNTER — Ambulatory Visit: Payer: Medicare PPO | Admitting: Nurse Practitioner

## 2021-04-25 VITALS — BP 140/78 | HR 61 | Ht 65.0 in | Wt 210.0 lb

## 2021-04-25 DIAGNOSIS — R0789 Other chest pain: Secondary | ICD-10-CM | POA: Diagnosis not present

## 2021-04-25 DIAGNOSIS — I10 Essential (primary) hypertension: Secondary | ICD-10-CM | POA: Diagnosis not present

## 2021-04-25 DIAGNOSIS — E782 Mixed hyperlipidemia: Secondary | ICD-10-CM

## 2021-04-25 DIAGNOSIS — Z79899 Other long term (current) drug therapy: Secondary | ICD-10-CM | POA: Diagnosis not present

## 2021-04-25 DIAGNOSIS — R0602 Shortness of breath: Secondary | ICD-10-CM | POA: Diagnosis not present

## 2021-04-25 DIAGNOSIS — E785 Hyperlipidemia, unspecified: Secondary | ICD-10-CM | POA: Diagnosis not present

## 2021-04-25 DIAGNOSIS — I2510 Atherosclerotic heart disease of native coronary artery without angina pectoris: Secondary | ICD-10-CM | POA: Diagnosis not present

## 2021-04-25 DIAGNOSIS — E669 Obesity, unspecified: Secondary | ICD-10-CM | POA: Diagnosis not present

## 2021-04-25 DIAGNOSIS — R002 Palpitations: Secondary | ICD-10-CM | POA: Diagnosis not present

## 2021-04-25 LAB — HEPATIC FUNCTION PANEL
ALT: 11 IU/L (ref 0–32)
AST: 17 IU/L (ref 0–40)
Albumin: 4.5 g/dL (ref 3.8–4.8)
Alkaline Phosphatase: 77 IU/L (ref 44–121)
Bilirubin Total: 0.5 mg/dL (ref 0.0–1.2)
Bilirubin, Direct: 0.11 mg/dL (ref 0.00–0.40)
Total Protein: 7 g/dL (ref 6.0–8.5)

## 2021-04-25 LAB — LIPID PANEL
Chol/HDL Ratio: 5.3 ratio — ABNORMAL HIGH (ref 0.0–4.4)
Cholesterol, Total: 245 mg/dL — ABNORMAL HIGH (ref 100–199)
HDL: 46 mg/dL (ref >39)
LDL Chol Calc (NIH): 171 mg/dL — ABNORMAL HIGH (ref 0–99)
Triglycerides: 151 mg/dL — ABNORMAL HIGH (ref 0–149)
VLDL Cholesterol Cal: 28 mg/dL (ref 5–40)

## 2021-04-25 MED ORDER — AMLODIPINE BESYLATE 10 MG PO TABS: 10.0000 mg | ORAL_TABLET | Freq: Every day | ORAL | 3 refills | Status: DC

## 2021-04-25 NOTE — Progress Notes (Signed)
? ? ?Office Visit  ?  ?Patient Name: Veronica [name] ?Date of Encounter: 04/25/2021 ? ?Primary Care Provider:  Martinique, Betty G, MD ?Primary Cardiologist:  Glenetta Hew, MD ? ?Chief Complaint  ?  ?71 year old-year-old female with a history of mild nonobstructive CAD on coronary CTA in 2012, hypertension, hyperlipidemia, asthma, and anxiety who presents for follow-up related to chest pain and shortness of breath. ? ?Past Medical History  ?  ?Past Medical History:  ?Diagnosis Date  ? Allergic rhinitis   ? Anxiety and depression   ? Constipation, chronic   ? Coronary artery calcification seen on CAT scan 06/2010  ? Mild nonobstructive CAD on cardiac CT  -as of 2021, coronary calcium score 34  ? Hemorrhoids   ? Hip pain, bilateral   ? Hyperlipidemia   ? Hypertension   ? Leukoplakia of vagina   ? Low back pain   ? w/ left side radiculopathy, s/p injection at L4-L5 forarmen 1/06  ? Uterine fibroid   ? ?Past Surgical History:  ?Procedure Laterality Date  ? ABDOMINAL HYSTERECTOMY  11-2000  ? no oophorectomy per pt  ? CHOLECYSTECTOMY  12/08/2010  ? Procedure: LAPAROSCOPIC CHOLECYSTECTOMY WITH INTRAOPERATIVE CHOLANGIOGRAM;  Surgeon: Pedro Earls, MD;  Location: [facility];  Service: General;  Laterality: N/A;  c-arm   ? COLONOSCOPY WITH PROPOFOL N/A 02/02/2014  ? Procedure: COLONOSCOPY WITH PROPOFOL;  Surgeon: Garlan Fair, MD;  Location: [facility];  Service: Endoscopy;  Laterality: N/A;  ? CORONARY CALCIUM SCORE  12/2018  ? Coronary calcium score-34  ? CORONARY CT ANGIOGRAM  06/2010  ?  Coronary calcium score 12.  Mild nonobstructive disease.  Ostial LAD and first D1/RI.  Right dominant.  Focal scarring and bronchiectasis of medial right lower lobe.  ? LEEP    ? unknown date   ? NM MYOVIEW LTD  10/2010  ? Nonischemic.  Normal EF.  ? TUBAL LIGATION    ? bilateral 1989  ? UTERINE FIBROID SURGERY    ? ? ?Allergies ? ?No Known Allergies ? ?History of Present Illness  ?  ?71 year old-year-old female with the above past medical  history including mild nonobstructive CAD on coronary CTA in 2012, hypertension, hyperlipidemia, asthma, and anxiety. ?  ?She established care with Dr. Ellyn Hack in 2020 in the setting of strong family history of CAD. Coronary CTA in 2012 showed calcium score of 12, mild nonobstructive CAD.  Follow-up coronary calcium score in 2020 was 48 (68th percentile for age and sex). Outpatient Monitor in October 2021 in the setting of palpitations showed predominantly sinus rhythm, rare PACs/PVCs, short runs of PAT, no significant arrhythmia. ?  ?She evaluated in September 2022 and reported some exertional dyspnea and chest discomfort. Her chest pain was felt to be musculoskeletal at the time and no further work-up was recommended. She was evaluated twice in the ED twice since that time with complaints of intermittent chest pain and shortness of breath, initially on 01/26/2021 (she left before having lab work done) and again on 02/05/2021. Troponin was negative, EKG was unremarkable. Given she ruled out for ACS , and improvement of symptoms, she was discharged home in stable condition and advised to follow-up with cardiology as an outpatient.  ? ?She was last seen in the office on 02/28/2021 and reported some ongoing dyspnea on exertion that improved with inhalers. Repeat coronary CTA calcium score 28.4 (70 percentile for age/sex), nonobstructive CAD.  Gram showed EF 55 to 60%, G1 DD, aortic valve sclerosis without evidence of stenosis.  She presents today for follow-up.  Since her last visit  she has done well from a cardiac standpoint.  Her shortness of breath has improved greatly with the use of her inhalers, she still has occasional chest discomfort associated with bending movements, this appears musculoskeletal in nature.  She does report a decrease in overall anxiety. Her BP remains somewhat elevated at home. She is exercising regularly and tolerating this well, though she is frustrated that she has not started to lose any  weight.  Otherwise, she reports feeling well and denies any additional concerns or complaints today. ? ?Home Medications  ?  ?Current Outpatient Medications  ?Medication Sig Dispense Refill  ? albuterol (VENTOLIN HFA) 108 (90 Base) MCG/ACT inhaler Inhale 2 puffs into the lungs every 6 (six) hours as needed for wheezing or shortness of breath. 1 each 11  ? ALPRAZolam (XANAX) 0.25 MG tablet Take 1 tablet (0.25 mg total) by mouth at bedtime as needed for anxiety or sleep. 30 tablet 1  ? amLODipine (NORVASC) 5 MG tablet TAKE 1 TABLET(5 MG) BY MOUTH EVERY MORNING 90 tablet 1  ? cholecalciferol (VITAMIN D) 25 MCG (1000 UNIT) tablet Take 1,000 Units by mouth daily.    ? fluticasone (FLONASE) 50 MCG/ACT nasal spray PLACE 1 SPRAY INTO BOTH NOSTRILS 2 (TWO) TIMES DAILY (Patient taking differently: Place 1 spray into both nostrils as needed for allergies or rhinitis.) 16 mL 2  ? fluticasone furoate-vilanterol (BREO ELLIPTA) 200-25 MCG/INH AEPB Inhale 1 puff into the lungs daily. (Patient taking differently: Inhale 1 puff into the lungs as needed (SOB).) 60 each 11  ? losartan (COZAAR) 50 MG tablet Take 1 tablet (50 mg total) by mouth daily. 90 tablet 3  ? MEGARED OMEGA-3 KRILL OIL PO Take 1 capsule by mouth daily.    ? naproxen sodium (ALEVE) 220 MG tablet Take 220 mg by mouth daily as needed (pain).    ? OVER THE COUNTER MEDICATION Take 2 tablets by mouth at bedtime. Tryptophan,magnesium,vitamin b6    ? rosuvastatin (CRESTOR) 20 MG tablet Take 1 tablet (20 mg total) by mouth daily. (Patient not taking: Reported on 04/25/2021) 90 tablet 3  ? ?No current facility-administered medications for this visit.  ?  ? ?Review of Systems  ?  ?She denies chest pain, palpitations, dyspnea, pnd, orthopnea, n, v, dizziness, syncope, edema, weight gain, or early satiety. All other systems reviewed and are otherwise negative except as noted above.  ? ?Physical Exam  ?  ?VS:  BP 140/78   Pulse 61   Ht '5\' 5"'$  (1.651 m)   Wt 210 lb (95.3 kg)    SpO2 97%   BMI 34.95 kg/m?   ?GEN: Well nourished, well developed, in no acute distress. ?HEENT: normal. ?Neck: Supple, no JVD, carotid bruits, or masses. ?Cardiac: RRR, no murmurs, rubs, or gallops. No clubbing, cyanosis, edema.  Radials/DP/PT 2+ and equal bilaterally.  ?Respiratory:  Respirations regular and unlabored, clear to auscultation bilaterally. ?GI: Soft, nontender, nondistended, BS + x 4. ?MS: no deformity or atrophy. ?Skin: warm and dry, no rash. ?Neuro:  Strength and sensation are intact. ?Psych: Normal affect. ? ?Accessory Clinical Findings  ?  ?ECG personally reviewed by me today - No EKG in office today.  ? ?Lab Results  ?Component Value Date  ? WBC 6.3 02/05/2021  ? HGB 13.2 02/05/2021  ? HCT 38.1 02/05/2021  ? MCV 87.4 02/05/2021  ? PLT 270 02/05/2021  ? ?Lab Results  ?Component Value Date  ? CREATININE 0.75 02/28/2021  ?  BUN 16 02/28/2021  ? NA 137 02/28/2021  ? K 4.2 02/28/2021  ? CL 102 02/28/2021  ? CO2 22 02/28/2021  ? ?Lab Results  ?Component Value Date  ? ALT 12 03/09/2021  ? AST 16 03/09/2021  ? ALKPHOS 80 03/09/2021  ? BILITOT 0.4 03/09/2021  ? ?Lab Results  ?Component Value Date  ? CHOL 141 03/09/2021  ? HDL 50 03/09/2021  ? [place] 74 03/09/2021  ? LDLDIRECT 161.6 06/03/2012  ? TRIG 90 03/09/2021  ? CHOLHDL 2.8 03/09/2021  ?  ?Lab Results  ?Component Value Date  ? HGBA1C 5.6 10/13/2020  ? ? ?Assessment & Plan  ? ?1. Mild nonobstructive CAD/chest pain/shortness of breath: Recent intermittent left upper chest discomfort that radiates to her left arm largely associated with emotional stress and anxiety, likely musculoskeletal. She does have some dyspnea on exertion which has improved with inhalers. Repeat coronary CTA 03/2021, showed calcium score 28.4 (70 percentile for age/sex), nonobstructive CAD.  Echo showed EF 55 to 60%, G1 DD, aortic valve sclerosis without evidence of stenosis.  She denies additional symptoms concerning for angina.  Recently evaluated by pulmonology, who  recommended ongoing use of Breo as it seems to help her symptoms.  Continue medical therapy with amlodipine as below, losartan, and Crestor. ? ?2. Palpitations: Monitor in October 2021 in the setting of palpitations s

## 2021-04-25 NOTE — Patient Instructions (Signed)
Medication Instructions:  ?Increase Amlodipine 10 mg daily as directed ? ?*If you need a refill on your cardiac medications before your next appointment, please call your pharmacy* ? ? ?Lab Work: ?Complete labs today Lipid, HFP ? ?If you have labs (blood work) drawn today and your tests are completely normal, you will receive your results only by: ?MyChart Message (if you have MyChart) OR ?A paper copy in the mail ?If you have any lab test that is abnormal or we need to change your treatment, we will call you to review the results. ? ? ?Testing/Procedures: ?NONE ordered at this time of appointment  ? ? ? ?Follow-Up: ?At [facility], you and your health needs are our priority.  As part of our continuing mission to provide you with exceptional heart care, we have created designated Provider Care Teams.  These Care Teams include your primary Cardiologist (physician) and Advanced Practice Providers (APPs -  Physician Assistants and Nurse Practitioners) who all work together to provide you with the care you need, when you need it. ? ?We recommend signing up for the patient portal called "MyChart".  Sign up information is provided on this After Visit Summary.  MyChart is used to connect with patients for Virtual Visits (Telemedicine).  Patients are able to view lab/test results, encounter notes, upcoming appointments, etc.  Non-urgent messages can be sent to your provider as well.   ?To learn more about what you can do with MyChart, go to [email or web address].   ? ?Your next appointment:   ?2 month(s) ? ?The format for your next appointment:   ?In Person ? ?Provider:   ?Diona Browner, NP      ? ? ?Other Instructions ?Referral sent to [facility] ?Monitor Blood Pressure as discussed in office.  ? ?

## 2021-04-25 NOTE — Addendum Note (Signed)
Addended by: Derrick Ravel on: 05/20/310 81:18 AM ? ? Modules accepted: Orders ? ?

## 2021-04-26 ENCOUNTER — Telehealth: Payer: Self-pay

## 2021-04-26 DIAGNOSIS — Z79899 Other long term (current) drug therapy: Secondary | ICD-10-CM

## 2021-04-26 DIAGNOSIS — E7800 Pure hypercholesterolemia, unspecified: Secondary | ICD-10-CM

## 2021-04-26 NOTE — Telephone Encounter (Signed)
Spoke with pt. Pt was notified of results. Pt was fasting and didn't have anything to eat after 9 pm the night before labs. Will discuss with Raquel Sarna.  ? ?Spoke with Raquel Sarna. Pt will come back and repeat fasting Lipid panel & HFP in 6 weeks. Restart Crestor 20 mg daily if not taking it and if pt is taking Crestor 20 mg, pt can increase to 40 g daily.  ?  ?Spoke with pt and she stopped her Crestor 20 mg due to possible leg pain. Pt stated she is going to restart Crestor 20 mg and will call back if she isn't tolerating medication. Pt is aware that she will return in 6-8 weeks for repeat labs. Orders placed.   ?  ? ?

## 2021-05-16 ENCOUNTER — Telehealth: Payer: Self-pay | Admitting: Family Medicine

## 2021-05-16 ENCOUNTER — Other Ambulatory Visit: Payer: Self-pay | Admitting: Family Medicine

## 2021-05-16 DIAGNOSIS — F411 Generalized anxiety disorder: Secondary | ICD-10-CM

## 2021-05-16 NOTE — Telephone Encounter (Signed)
Needs refill due to going out of town.  ?

## 2021-05-16 NOTE — Telephone Encounter (Signed)
Pt call and stated she need a refill on ALPRAZolam (XANAX) 0.25 MG tablet [facility] to  ?[id number] [address]:  [phone] ?Fax:  [phone] ?  ? ?

## 2021-05-16 NOTE — Telephone Encounter (Signed)
Last refill in 2022 ?

## 2021-05-16 NOTE — Telephone Encounter (Signed)
Already sent to pcp to review.  ?

## 2021-06-28 NOTE — Progress Notes (Unsigned)
HPI: [name] is a 71 y.o. female, who is here today for follow-up. She was last seen on 02/07/2021, when she was complaining about shortness of breath. It has improved. Since her last visit she has seen cardiologist and pulmonologist. COVID 19 infection the first week of 05/2021 , about 5 days after returning from [place]. She has had intermittent chest tightness, occasional cough,wheezing,and post nasal drainage. She is on Breo 200 mcg daily prn and Albuterol inh prn. Planning on arranging appt with immunologist.  Hypertension:  Medications: Losartan 50 mg daily and amlodipine 10 mg daily. BP readings at home:120-130/70-80. Side effects:None. Negative for unusual or severe headache, visual changes, exertional chest pain, dyspnea,  focal weakness, or edema.  Lab Results  Component Value Date   CREATININE 0.75 02/28/2021   BUN 16 02/28/2021   NA 137 02/28/2021   K 4.2 02/28/2021   CL 102 02/28/2021   CO2 22 02/28/2021   Anxiety: Currently she is on alprazolam 0.25 mg daily as needed. Hyperlipidemia and aortic atherosclerosis. She is on rosuvastatin 20 mg daily. Has an appt next week with her cardiologist.  Lab Results  Component Value Date   CHOL 245 (H) 04/25/2021   HDL 46 04/25/2021   LDLCALC 171 (H) 04/25/2021   LDLDIRECT 161.6 06/03/2012   TRIG 151 (H) 04/25/2021   CHOLHDL 5.3 (H) 04/25/2021   She would like "lump" to be removed, localized on left upper back. It gets tender and inflamed intermittently. She has had lesion for about 5 years, slowly growing.   Chronic lower back pain getting worse. It is radiated again to LLE with intermittent numbness. Negative for saddle anesthesia or bladder/bowel dysfunction.. It is usually exacerbated by prolonged standing and walking. She has seen ortho in the past. No hx of trauma.  Review of Systems  Constitutional:  Negative for activity change, appetite change and fever.  HENT:  Negative for mouth sores, nosebleeds  and sore throat.   Gastrointestinal:  Negative for abdominal pain, nausea and vomiting.       Negative for changes in bowel habits.  Genitourinary:  Negative for decreased urine volume, difficulty urinating, dysuria and hematuria.  Musculoskeletal:  Negative for gait problem.  Skin:  Negative for rash.  Neurological:  Negative for syncope, facial asymmetry and weakness.  Rest see pertinent positives and negatives per HPI.  Current Outpatient Medications on File Prior to Visit  Medication Sig Dispense Refill   albuterol (VENTOLIN HFA) 108 (90 Base) MCG/ACT inhaler Inhale 2 puffs into the lungs every 6 (six) hours as needed for wheezing or shortness of breath. 1 each 11   ALPRAZolam (XANAX) 0.25 MG tablet TAKE 1 TABLET (0.25 MG TOTAL) BY MOUTH AT BEDTIME AS NEEDED FOR ANXIETY OR SLEEP. 30 tablet 1   amLODipine (NORVASC) 10 MG tablet Take 1 tablet (10 mg total) by mouth daily. 30 tablet 3   cholecalciferol (VITAMIN D) 25 MCG (1000 UNIT) tablet Take 1,000 Units by mouth daily.     fluticasone furoate-vilanterol (BREO ELLIPTA) 200-25 MCG/INH AEPB Inhale 1 puff into the lungs daily. (Patient taking differently: Inhale 1 puff into the lungs as needed (SOB).) 60 each 11   losartan (COZAAR) 50 MG tablet Take 1 tablet (50 mg total) by mouth daily. 90 tablet 3   MEGARED OMEGA-3 KRILL OIL PO Take 1 capsule by mouth daily.     naproxen sodium (ALEVE) 220 MG tablet Take 220 mg by mouth daily as needed (pain).     OVER THE COUNTER  MEDICATION Take 2 tablets by mouth at bedtime. Tryptophan,magnesium,vitamin b6     rosuvastatin (CRESTOR) 20 MG tablet Take 1 tablet (20 mg total) by mouth daily. 90 tablet 3   fluticasone (FLONASE) 50 MCG/ACT nasal spray PLACE 1 SPRAY INTO BOTH NOSTRILS 2 (TWO) TIMES DAILY (Patient taking differently: Place 1 spray into both nostrils as needed for allergies or rhinitis.) 16 mL 2   No current facility-administered medications on file prior to visit.   Past Medical History:   Diagnosis Date   Allergic rhinitis    Anxiety and depression    Constipation, chronic    Coronary artery calcification seen on CAT scan 06/2010   Mild nonobstructive CAD on cardiac CT  -as of 2021, coronary calcium score 34   Hemorrhoids    Hip pain, bilateral    Hyperlipidemia    Hypertension    Leukoplakia of vagina    Low back pain    w/ left side radiculopathy, s/p injection at L4-L5 forarmen 1/06   Uterine fibroid    No Known Allergies  Social History   Socioeconomic History   Marital status: Divorced    Spouse name: Not on file   Number of children: 3   Years of education: Not on file   Highest education level: Not on file  Occupational History   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   Social Determinants of Health   Financial Resource Strain: Low Risk    Difficulty of Paying Living Expenses: Not hard at all  Food Insecurity: No Food Insecurity   Worried About Charity fundraiser in the Last Year: Never true   [place] in the Last Year: Never true  Transportation Needs: No Transportation Needs   Lack of Transportation (Medical): No   Lack of Transportation (Non-Medical): No  Physical Activity: Sufficiently Active   Days of Exercise per Week: 5 days   Minutes of Exercise per Session: 30 min  Stress: No Stress Concern Present   Feeling of Stress : Not at all  Social Connections: Moderately Integrated   Frequency of Communication with Friends and Family: More than three times a week    Frequency of Social Gatherings with Friends and Family: More than three times a week   Attends Religious Services: 1 to 4 times per year   Active Member of [employer] or Organizations: Yes   Attends Archivist Meetings: 1 to 4 times per year   Marital Status: Divorced   Vitals:   06/30/21 1355  BP: 120/80  Pulse: 71  Resp: 16  Temp: 98 F (36.7 C)  SpO2: 97%   Body mass index is 34.34 kg/m.  Physical Exam Vitals and nursing note reviewed.  Constitutional:      General: She is not in acute distress.    Appearance: She is well-developed.  HENT:     Head: Normocephalic and atraumatic.     Mouth/Throat:  Mouth: Mucous membranes are moist.     Pharynx: Oropharynx is clear.  Eyes:     Conjunctiva/sclera: Conjunctivae normal.  Cardiovascular:     Rate and Rhythm: Normal rate and regular rhythm.     Pulses:          Dorsalis pedis pulses are 2+ on the right side and 2+ on the left side.     Heart sounds: No murmur heard. Pulmonary:     Effort: Pulmonary effort is normal. No respiratory distress.     Breath sounds: Normal breath sounds.  Abdominal:     Palpations: Abdomen is soft. There is no mass.     Tenderness: There is no abdominal tenderness.  Musculoskeletal:     Lumbar back: No tenderness or bony tenderness. Negative right straight leg raise test and negative left straight leg raise test.  Lymphadenopathy:     Cervical: No cervical adenopathy.  Skin:    General: Skin is warm.     Findings: No erythema or rash.       Neurological:     General: No focal deficit present.     Mental Status: She is alert and oriented to person, place, and time.     Cranial Nerves: No cranial nerve deficit.     Gait: Gait normal.  Psychiatric:     Comments: Well groomed, good eye contact.   ASSESSMENT AND PLAN:  Ms.Monte was seen today for follow-up.  Diagnoses and all orders for this visit: Orders Placed This Encounter  Procedures   Ambulatory referral to General  Surgery   Ambulatory referral to Orthopedic Surgery   Mild intermittent reactive airway disease without complication Today auscultation negative. Instructed to use Breo 200 mcg once daily, swish after use. Continue Albuterol inh q 4-6 hours prn. Keep appt with immunologist.  Sebaceous cyst Educated about Dx and prognosis. Frequently inflamed. Surgery referral placed.  Essential hypertension BP adequately controlled. Continue current management: Losartan 50 mg and Amlodipine 10 mg daily. DASH/low salt diet to continue.Continue monitoring BP at home.  Anxiety disorder Stable. Continue Xanax 0.25 mg daily prn.  Aortic atherosclerosis (HCC) Continue Rosuvastatin 20 mg daily and low fat diet. She has appt with cardiologist and FLP is going to be done.  Lumbar back pain with radiculopathy affecting left lower extremity Getting worse. She has had epidural injections before. Ortho referral placed.  Return in about 15 weeks (around 10/13/2021) for cpe.  Vann Okerlund G. Martinique, MD  [facility] [place] office.

## 2021-06-30 ENCOUNTER — Ambulatory Visit: Payer: Medicare PPO | Admitting: Family Medicine

## 2021-06-30 ENCOUNTER — Encounter: Payer: Self-pay | Admitting: Family Medicine

## 2021-06-30 VITALS — BP 120/80 | HR 71 | Temp 98.0°F | Resp 16 | Ht 65.0 in | Wt 206.4 lb

## 2021-06-30 DIAGNOSIS — F411 Generalized anxiety disorder: Secondary | ICD-10-CM | POA: Diagnosis not present

## 2021-06-30 DIAGNOSIS — I700 Atherosclerosis of aorta: Secondary | ICD-10-CM

## 2021-06-30 DIAGNOSIS — M5416 Radiculopathy, lumbar region: Secondary | ICD-10-CM

## 2021-06-30 DIAGNOSIS — I10 Essential (primary) hypertension: Secondary | ICD-10-CM | POA: Diagnosis not present

## 2021-06-30 DIAGNOSIS — L723 Sebaceous cyst: Secondary | ICD-10-CM | POA: Diagnosis not present

## 2021-06-30 DIAGNOSIS — J4520 Mild intermittent asthma, uncomplicated: Secondary | ICD-10-CM | POA: Diagnosis not present

## 2021-06-30 NOTE — Assessment & Plan Note (Signed)
Getting worse. She has had epidural injections before. Ortho referral placed.

## 2021-06-30 NOTE — Assessment & Plan Note (Signed)
BP adequately controlled. Continue current management: Losartan 50 mg and Amlodipine 10 mg daily. DASH/low salt diet to continue.Continue monitoring BP at home. She has an appt with cardiologist next week.

## 2021-06-30 NOTE — Patient Instructions (Addendum)
A few things to remember from today's visit:  Mild intermittent reactive airway disease without complication  Aortic atherosclerosis ([place]), Chronic  Sebaceous cyst - Plan: Ambulatory referral to General Surgery  Lumbar back pain with radiculopathy affecting left lower extremity - Plan: Ambulatory referral to Orthopedic Surgery  If you need refills please call your pharmacy. Do not use My Chart to request refills or for acute issues that need immediate attention.   No changes today except for Breo to take daily.  Keep appt with cardiologist.  Please be sure medication list is accurate. If a new problem present, please set up appointment sooner than planned today.

## 2021-06-30 NOTE — Assessment & Plan Note (Signed)
Stable. Continue Xanax 0.25 mg daily prn.

## 2021-06-30 NOTE — Assessment & Plan Note (Signed)
Continue Rosuvastatin 20 mg daily and low fat diet. She has appt with cardiologist and FLP is going to be done.

## 2021-07-01 ENCOUNTER — Encounter: Payer: Self-pay | Admitting: Family Medicine

## 2021-07-02 NOTE — Progress Notes (Unsigned)
Office Visit    Patient Name: Veronica [name] Date of Encounter: 07/03/2021  Primary Care Provider:  Martinique, Betty G, MD Primary Cardiologist:  Glenetta Hew, MD  Chief Complaint    71 year old-year-old female with a history of mild nonobstructive CAD, hypertension, hyperlipidemia, asthma, and anxiety who presents for follow-up related to chest pain and shortness of breath.  Past Medical History    Past Medical History:  Diagnosis Date   Allergic rhinitis    Anxiety and depression    Constipation, chronic    Coronary artery calcification seen on CAT scan 06/2010   Mild nonobstructive CAD on cardiac CT  -as of 2021, coronary calcium score 34   Hemorrhoids    Hip pain, bilateral    Hyperlipidemia    Hypertension    Leukoplakia of vagina    Low back pain    w/ left side radiculopathy, s/p injection at L4-L5 forarmen 1/06   Uterine fibroid    Past Surgical History:  Procedure Laterality Date   ABDOMINAL HYSTERECTOMY  11-2000   no oophorectomy per pt   CHOLECYSTECTOMY  12/08/2010   Procedure: LAPAROSCOPIC CHOLECYSTECTOMY WITH INTRAOPERATIVE CHOLANGIOGRAM;  Surgeon: Pedro Earls, MD;  Location: [facility];  Service: General;  Laterality: N/A;  c-arm    COLONOSCOPY WITH PROPOFOL N/A 02/02/2014   Procedure: COLONOSCOPY WITH PROPOFOL;  Surgeon: Garlan Fair, MD;  Location: [facility];  Service: Endoscopy;  Laterality: N/A;   CORONARY CALCIUM SCORE  12/2018   Coronary calcium score-34   CORONARY CT ANGIOGRAM  06/2010    Coronary calcium score 12.  Mild nonobstructive disease.  Ostial LAD and first D1/RI.  Right dominant.  Focal scarring and bronchiectasis of medial right lower lobe.   LEEP     unknown date    NM MYOVIEW LTD  10/2010   Nonischemic.  Normal EF.   TUBAL LIGATION     bilateral 1989   UTERINE FIBROID SURGERY      Allergies  No Known Allergies  History of Present Illness    71 year old-year-old female with the above past medical history including mild  nonobstructive CAD, hypertension, hyperlipidemia, asthma, and anxiety.   She established care with Dr. Ellyn Hack in 2020 in the setting of strong family history of CAD. Coronary CTA in 2012 showed calcium score of 12, mild nonobstructive CAD. Follow-up coronary calcium score in 2020 was 26 (68th percentile for age and sex). Outpatient Monitor in October 2021 in the setting of palpitations showed predominantly sinus rhythm, rare PACs/PVCs, short runs of PAT, no significant arrhythmia.   She was evaluated in September 2022 and reported exertional dyspnea and chest discomfort. Her chest pain was felt to be musculoskeletal at the time and no further work-up was recommended. She was evaluated twice in the ED twice since that time with complaints of intermittent chest pain and shortness of breath, initially on 01/26/2021 (she left before having lab work done) and again on 02/05/2021. Troponin was negative, EKG was unremarkable. Given she ruled out for ACS, and improvement of symptoms, she was discharged home in stable condition and advised to follow-up with cardiology as an outpatient.    Repeat coronary CTA in February 2023 in the setting of ongoing chest pain and exertional dyspnea revealed calcium score 28.4 (70 percentile for age/sex), nonobstructive CAD.  Echocardiogram showed EF 55 to 60%, G1 DD, aortic valve sclerosis without evidence of stenosis.  She was last seen in the office on 04/25/2021 and was stable from a cardiac standpoint.  Her shortness of breath had improved greatly with inhalers.  She reported occasional positional chest discomfort, thought to be musculoskeletal. Repeat fasting lipid panel showed LDL 171, above goal.  She had stopped taking her Crestor due to leg discomfort.  She was advised to restart her Crestor and notify our office in case of any worsening leg discomfort.  Initially, she was referred to the healthy weight and wellness clinic for weight loss. She saw her PCP earlier this month  who referred her to immunologist in the setting of ongoing asthma.  Additionally, she had a COVID-19 infection following a trip to [place] in May.  She presents today for follow-up.  Since her last visit she has done well from a cardiac standpoint.  She denies symptoms concerning for angina, denies worsening dyspnea. She is exercising regularly and is losing weight.  She has an appointment with the healthy weight and wellness clinic for September 2023 (due to wait list).  She states her BP has been slightly elevated at times. Overall, she reports feeling well and denies any new concerns today.  Home Medications    Current Outpatient Medications  Medication Sig Dispense Refill   albuterol (VENTOLIN HFA) 108 (90 Base) MCG/ACT inhaler Inhale 2 puffs into the lungs every 6 (six) hours as needed for wheezing or shortness of breath. 1 each 11   ALPRAZolam (XANAX) 0.25 MG tablet TAKE 1 TABLET (0.25 MG TOTAL) BY MOUTH AT BEDTIME AS NEEDED FOR ANXIETY OR SLEEP. 30 tablet 1   amLODipine (NORVASC) 10 MG tablet Take 1 tablet (10 mg total) by mouth daily. 30 tablet 3   cholecalciferol (VITAMIN D) 25 MCG (1000 UNIT) tablet Take 1,000 Units by mouth daily.     fluticasone furoate-vilanterol (BREO ELLIPTA) 200-25 MCG/INH AEPB Inhale 1 puff into the lungs daily. (Patient taking differently: Inhale 1 puff into the lungs as needed (SOB).) 60 each 11   MEGARED OMEGA-3 KRILL OIL PO Take 1 capsule by mouth daily.     naproxen sodium (ALEVE) 220 MG tablet Take 220 mg by mouth daily as needed (pain).     OVER THE COUNTER MEDICATION Take 2 tablets by mouth at bedtime. Tryptophan,magnesium,vitamin b6     valsartan (DIOVAN) 80 MG tablet Take 1 tablet (80 mg total) by mouth daily. 30 tablet 11   fluticasone (FLONASE) 50 MCG/ACT nasal spray PLACE 1 SPRAY INTO BOTH NOSTRILS 2 (TWO) TIMES DAILY (Patient taking differently: Place 1 spray into both nostrils as needed for allergies or rhinitis.) 16 mL 2   rosuvastatin (CRESTOR) 20 MG  tablet Take 1 tablet (20 mg total) by mouth daily. 90 tablet 3   No current facility-administered medications for this visit.     Review of Systems    She denies chest pain, palpitations, dyspnea, pnd, orthopnea, n, v, dizziness, syncope, edema, weight gain, or early satiety. All other systems reviewed and are otherwise negative except as noted above.   Physical Exam    VS:  BP 132/79   Pulse 66   Ht '5\' 5"'$  (1.651 m)   Wt 202 lb 6.4 oz (91.8 kg)   SpO2 98%   BMI 33.68 kg/m   GEN: Well nourished, well developed, in no acute distress. HEENT: normal. Neck: Supple, no JVD, carotid bruits, or masses. Cardiac: RRR, no murmurs, rubs, or gallops. No clubbing, cyanosis, edema.  Radials/DP/PT 2+ and equal bilaterally.  Respiratory:  Respirations regular and unlabored, clear to auscultation bilaterally. GI: Soft, nontender, nondistended, BS + x 4. MS: no deformity or  atrophy. Skin: warm and dry, no rash. Neuro:  Strength and sensation are intact. Psych: Normal affect.  Accessory Clinical Findings    ECG personally reviewed by me today - No EKG in office today.   Lab Results  Component Value Date   WBC 6.3 02/05/2021   HGB 13.2 02/05/2021   HCT 38.1 02/05/2021   MCV 87.4 02/05/2021   PLT 270 02/05/2021   Lab Results  Component Value Date   CREATININE 0.75 02/28/2021   BUN 16 02/28/2021   NA 137 02/28/2021   K 4.2 02/28/2021   CL 102 02/28/2021   CO2 22 02/28/2021   Lab Results  Component Value Date   ALT 11 04/25/2021   AST 17 04/25/2021   ALKPHOS 77 04/25/2021   BILITOT 0.5 04/25/2021   Lab Results  Component Value Date   CHOL 245 (H) 04/25/2021   HDL 46 04/25/2021   LDLCALC 171 (H) 04/25/2021   LDLDIRECT 161.6 06/03/2012   TRIG 151 (H) 04/25/2021   CHOLHDL 5.3 (H) 04/25/2021    Lab Results  Component Value Date   HGBA1C 5.6 10/13/2020    Assessment & Plan    1. Mild nonobstructive CAD/chest pain/shortness of breath: Repeat coronary CTA 03/2021, showed  calcium score 28.4 (70 percentile for age/sex), nonobstructive CAD. Echo showed EF 55 to 60%, G1 DD, aortic valve sclerosis without evidence of stenosis. Recent intermittent left upper chest discomfort likely musculoskeletal. Dyspnea on exertion which has improved with inhalers and increased activity. Denies symptoms concerning for angina. She is following with pulmonology, additionally she has been referred to immunologist per PCP for chronic asthma.  Continue amlodipine as below, losartan, and Crestor.  2. Palpitations: Monitor in October 2021 in the setting of palpitations showed predominantly sinus rhythm, rare PACs/PVCs, short runs of PAT, no significant arrhythmia.  Denies palpitations.  Stable.   3. Hypertension: BP elevated slightly above goal in office today, consistent with home readings.  Klonopin was increased at last visit with some improvement BP.  We will stop losartan and start valsartan 80 mg daily.  Continue to monitor home BP and report BP consistently > 130/80 or SBP <110.  Continue amlodipine.  4. Hyperlipidemia: LDL 74 in 03/2021. Recently restarted on Crestor 20 mg daily. Repeat lipids, LFTs today.     5. Obesity: She is exercising and is losing weight.  She has an appointment with healthy weight values for further evaluation and management in September 2023.  Encouraged ongoing lifestyle modifications with diet and exercise.   6. Disposition: Follow-up in 3-4 months.   Lenna Sciara, NP 07/03/2021, 9:17 AM

## 2021-07-03 ENCOUNTER — Encounter: Payer: Self-pay | Admitting: Nurse Practitioner

## 2021-07-03 ENCOUNTER — Ambulatory Visit (INDEPENDENT_AMBULATORY_CARE_PROVIDER_SITE_OTHER): Payer: Medicare PPO | Admitting: Nurse Practitioner

## 2021-07-03 VITALS — BP 132/79 | HR 66 | Ht 65.0 in | Wt 202.4 lb

## 2021-07-03 DIAGNOSIS — E785 Hyperlipidemia, unspecified: Secondary | ICD-10-CM

## 2021-07-03 DIAGNOSIS — R002 Palpitations: Secondary | ICD-10-CM | POA: Diagnosis not present

## 2021-07-03 DIAGNOSIS — I10 Essential (primary) hypertension: Secondary | ICD-10-CM

## 2021-07-03 DIAGNOSIS — I2510 Atherosclerotic heart disease of native coronary artery without angina pectoris: Secondary | ICD-10-CM | POA: Diagnosis not present

## 2021-07-03 DIAGNOSIS — E66811 Obesity, class 1: Secondary | ICD-10-CM

## 2021-07-03 DIAGNOSIS — Z79899 Other long term (current) drug therapy: Secondary | ICD-10-CM | POA: Diagnosis not present

## 2021-07-03 DIAGNOSIS — Z6834 Body mass index (BMI) 34.0-34.9, adult: Secondary | ICD-10-CM | POA: Diagnosis not present

## 2021-07-03 DIAGNOSIS — E669 Obesity, unspecified: Secondary | ICD-10-CM

## 2021-07-03 DIAGNOSIS — E7800 Pure hypercholesterolemia, unspecified: Secondary | ICD-10-CM | POA: Diagnosis not present

## 2021-07-03 LAB — LIPID PANEL
Chol/HDL Ratio: 2.4 ratio (ref 0.0–4.4)
Cholesterol, Total: 144 mg/dL (ref 100–199)
HDL: 59 mg/dL (ref >39)
LDL Chol Calc (NIH): 69 mg/dL (ref 0–99)
Triglycerides: 86 mg/dL (ref 0–149)
VLDL Cholesterol Cal: 16 mg/dL (ref 5–40)

## 2021-07-03 LAB — HEPATIC FUNCTION PANEL
ALT: 16 IU/L (ref 0–32)
AST: 19 IU/L (ref 0–40)
Albumin: 4.9 g/dL — ABNORMAL HIGH (ref 3.8–4.8)
Alkaline Phosphatase: 71 IU/L (ref 44–121)
Bilirubin Total: 0.5 mg/dL (ref 0.0–1.2)
Bilirubin, Direct: 0.15 mg/dL (ref 0.00–0.40)
Total Protein: 7.4 g/dL (ref 6.0–8.5)

## 2021-07-03 MED ORDER — VALSARTAN 80 MG PO TABS: 80.0000 mg | ORAL_TABLET | Freq: Every day | ORAL | 11 refills | Status: DC

## 2021-07-03 NOTE — Patient Instructions (Signed)
Medication Instructions:  STOP Losartan START Valsartan 80 mg daily   *If you need a refill on your cardiac medications before your next appointment, please call your pharmacy*   Lab Work: Complete HFP and Lipid Panel today  If you have labs (blood work) drawn today and your tests are completely normal, you will receive your results only by: [place] (if you have MyChart) OR A paper copy in the mail If you have any lab test that is abnormal or we need to change your treatment, we will call you to review the results.   Testing/Procedures: NONE ordered at this time of appointment     Follow-Up: At [facility], you and your health needs are our priority.  As part of our continuing mission to provide you with exceptional heart care, we have created designated Provider Care Teams.  These Care Teams include your primary Cardiologist (physician) and Advanced Practice Providers (APPs -  Physician Assistants and Nurse Practitioners) who all work together to provide you with the care you need, when you need it.  We recommend signing up for the patient portal called "MyChart".  Sign up information is provided on this After Visit Summary.  MyChart is used to connect with patients for Virtual Visits (Telemedicine).  Patients are able to view lab/test results, encounter notes, upcoming appointments, etc.  Non-urgent messages can be sent to your provider as well.   To learn more about what you can do with MyChart, go to [email or web address].    Your next appointment:   3-4 month(s)  The format for your next appointment:   In Person  Provider:   Glenetta Hew, MD     Other Instructions   Important Information About Sugar

## 2021-07-07 ENCOUNTER — Telehealth: Payer: Self-pay

## 2021-07-07 NOTE — Telephone Encounter (Signed)
Spoke with pt. Pt was notified of lab results and will follow up as planned.  

## 2021-07-19 ENCOUNTER — Other Ambulatory Visit: Payer: Self-pay | Admitting: Family Medicine

## 2021-07-25 ENCOUNTER — Other Ambulatory Visit: Payer: Self-pay | Admitting: Surgery

## 2021-07-25 DIAGNOSIS — L723 Sebaceous cyst: Secondary | ICD-10-CM | POA: Diagnosis not present

## 2021-07-25 DIAGNOSIS — L089 Local infection of the skin and subcutaneous tissue, unspecified: Secondary | ICD-10-CM | POA: Diagnosis not present

## 2021-08-03 NOTE — Patient Instructions (Signed)
DUE TO COVID-19 ONLY TWO VISITORS  (aged 70 and older)  ARE ALLOWED TO COME WITH YOU AND STAY IN THE WAITING ROOM ONLY DURING PRE OP AND PROCEDURE.   **NO VISITORS ARE ALLOWED IN THE SHORT STAY AREA OR RECOVERY ROOM!!**  IF YOU WILL BE ADMITTED INTO THE HOSPITAL YOU ARE ALLOWED ONLY FOUR SUPPORT PEOPLE DURING VISITATION HOURS ONLY (7 AM -8PM)   The support person(s) must pass our screening, gel in and out, and wear a mask at all times, including in the patient's room. Patients must also wear a mask when staff or their support person are in the room. Visitors GUEST BADGE MUST BE WORN VISIBLY  One adult visitor may remain with you overnight and MUST be in the room by 8 P.M.     Your procedure is scheduled on: 08/08/21   Report to [facility] Main Entrance    Report to admitting at: 5:15 AM   Call this number if you have problems the morning of surgery [phone]   Do not eat food :After Midnight.   After Midnight you may have the following liquids until: 4:30 AM DAY OF SURGERY  Water Black Coffee (sugar ok, NO MILK/CREAM OR CREAMERS)  Tea (sugar ok, NO MILK/CREAM OR CREAMERS) regular and decaf                             Plain Jell-O (NO RED)                                           Fruit ices (not with fruit pulp, NO RED)                                     Popsicles (NO RED)                                                                  Juice: apple, WHITE grape, WHITE cranberry Sports drinks like Gatorade (NO RED) Clear broth(vegetable,chicken,beef)  Oral Hygiene is also important to reduce your risk of infection.                                    Remember - BRUSH YOUR TEETH THE MORNING OF SURGERY WITH YOUR REGULAR TOOTHPASTE   Do NOT smoke after Midnight   Take these medicines the morning of surgery with A SIP OF WATER: Tylenol as needed.Use inhalers as usual  DO NOT TAKE ANY ORAL DIABETIC MEDICATIONS DAY OF YOUR SURGERY  Bring CPAP mask and tubing day of  surgery.                              You may not have any metal on your body including hair pins, jewelry, and body piercing             Do not wear make-up, lotions, powders, perfumes/cologne, or deodorant  Do not wear nail  polish including gel and S&S, artificial/acrylic nails, or any other type of covering on natural nails including finger and toenails. If you have artificial nails, gel coating, etc. that needs to be removed by a nail salon please have this removed prior to surgery or surgery may need to be canceled/ delayed if the surgeon/ anesthesia feels like they are unable to be safely monitored.   Do not shave  48 hours prior to surgery.    Do not bring valuables to the hospital. [place].   Contacts, dentures or bridgework may not be worn into surgery.   Bring small overnight bag day of surgery.   DO NOT [place]. PHARMACY WILL DISPENSE MEDICATIONS LISTED ON YOUR MEDICATION LIST TO YOU DURING YOUR ADMISSION [place]!    Patients discharged on the day of surgery will not be allowed to drive home.  Someone NEEDS to stay with you for the first 24 hours after anesthesia.   Special Instructions: Bring a copy of your healthcare power of attorney and living will documents         the day of surgery if you haven't scanned them before.              Please read over the following fact sheets you were given: IF YOU HAVE QUESTIONS ABOUT YOUR PRE-OP INSTRUCTIONS PLEASE CALL [phone]     [facility] - Preparing for Surgery Before surgery, you can play an important role.  Because skin is not sterile, your skin needs to be as free of germs as possible.  You can reduce the number of germs on your skin by washing with CHG (chlorahexidine gluconate) soap before surgery.  CHG is an antiseptic cleaner which kills germs and bonds with the skin to continue killing germs even after washing. Please DO NOT  use if you have an allergy to CHG or antibacterial soaps.  If your skin becomes reddened/irritated stop using the CHG and inform your nurse when you arrive at Short Stay. Do not shave (including legs and underarms) for at least 48 hours prior to the first CHG shower.  You may shave your face/neck. Please follow these instructions carefully:  1.  Shower with CHG Soap the night before surgery and the  morning of Surgery.  2.  If you choose to wash your hair, wash your hair first as usual with your  normal  shampoo.  3.  After you shampoo, rinse your hair and body thoroughly to remove the  shampoo.                           4.  Use CHG as you would any other liquid soap.  You can apply chg directly  to the skin and wash                       Gently with a scrungie or clean washcloth.  5.  Apply the CHG Soap to your body ONLY FROM THE NECK DOWN.   Do not use on face/ open                           Wound or open sores. Avoid contact with eyes, ears mouth and genitals (private parts).  Wash face,  Genitals (private parts) with your normal soap.             6.  Wash thoroughly, paying special attention to the area where your surgery  will be performed.  7.  Thoroughly rinse your body with warm water from the neck down.  8.  DO NOT shower/wash with your normal soap after using and rinsing off  the CHG Soap.                9.  Pat yourself dry with a clean towel.            10.  Wear clean pajamas.            11.  Place clean sheets on your bed the night of your first shower and do not  sleep with pets. Day of Surgery : Do not apply any lotions/deodorants the morning of surgery.  Please wear clean clothes to the hospital/surgery center.  FAILURE TO FOLLOW THESE INSTRUCTIONS MAY RESULT IN THE CANCELLATION OF YOUR SURGERY PATIENT SIGNATURE_________________________________  NURSE  SIGNATURE__________________________________  ________________________________________________________________________

## 2021-08-04 ENCOUNTER — Other Ambulatory Visit: Payer: Self-pay

## 2021-08-04 ENCOUNTER — Encounter (HOSPITAL_COMMUNITY): Payer: Self-pay

## 2021-08-04 ENCOUNTER — Other Ambulatory Visit: Payer: Self-pay | Admitting: Pulmonary Disease

## 2021-08-04 ENCOUNTER — Encounter (HOSPITAL_COMMUNITY)
Admission: RE | Admit: 2021-08-04 | Discharge: 2021-08-04 | Disposition: A | Discharge status: Home / Self Care | Payer: Medicare PPO | Source: Ambulatory Visit | Attending: Surgery | Admitting: Surgery

## 2021-08-04 VITALS — BP 137/75 | HR 65 | Temp 98.1°F | Ht 65.0 in | Wt 201.0 lb

## 2021-08-04 DIAGNOSIS — I10 Essential (primary) hypertension: Secondary | ICD-10-CM | POA: Diagnosis not present

## 2021-08-04 DIAGNOSIS — J4530 Mild persistent asthma, uncomplicated: Secondary | ICD-10-CM

## 2021-08-04 DIAGNOSIS — Z01818 Encounter for other preprocedural examination: Secondary | ICD-10-CM | POA: Insufficient documentation

## 2021-08-04 DIAGNOSIS — R0609 Other forms of dyspnea: Secondary | ICD-10-CM

## 2021-08-04 HISTORY — DX: Dyspnea, unspecified: R06.00

## 2021-08-04 HISTORY — DX: Unspecified asthma, uncomplicated: J45.909

## 2021-08-04 LAB — BASIC METABOLIC PANEL
Anion gap: 8 (ref 5–15)
BUN: 9 mg/dL (ref 8–23)
CO2: 25 mmol/L (ref 22–32)
Calcium: 9.4 mg/dL (ref 8.9–10.3)
Chloride: 103 mmol/L (ref 98–111)
Creatinine, Ser: 0.63 mg/dL (ref 0.44–1.00)
GFR, Estimated: >60 mL/min (ref >60)
Glucose, Bld: 93 mg/dL (ref 70–99)
Potassium: 4.3 mmol/L (ref 3.5–5.1)
Sodium: 136 mmol/L (ref 135–145)

## 2021-08-04 LAB — CBC
HCT: 39.4 % (ref 36.0–46.0)
Hemoglobin: 13.1 g/dL (ref 12.0–15.0)
MCH: 29.8 pg (ref 26.0–34.0)
MCHC: 33.2 g/dL (ref 30.0–36.0)
MCV: 89.5 fL (ref 80.0–100.0)
Platelets: 240 10*3/uL (ref 150–400)
RBC: 4.4 MIL/uL (ref 3.87–5.11)
RDW: 13.3 % (ref 11.5–15.5)
WBC: 6.2 10*3/uL (ref 4.0–10.5)
nRBC: 0 % (ref 0.0–0.2)

## 2021-08-04 NOTE — Progress Notes (Addendum)
For Short Stay: [place] appointment date: Date of COVID positive in last 74 days:  Bowel Prep reminder:   For Anesthesia: PCP - Dr. Betty Martinique. LOV: 06/30/21 Cardiologist - Dr. Glenetta Hew. Diona Browner: NP: LOV: 04/25/21  Chest x-ray - 02/05/21 EKG - 02/06/21 Stress Test -  ECHO - 03/08/21 Cardiac Cath -  Pacemaker/ICD device last checked: Pacemaker orders received: Device Rep notified:  Spinal Cord Stimulator:  Sleep Study -  CPAP -   Fasting Blood Sugar -  Checks Blood Sugar _____ times a day Date and result of last Hgb A1c-  Blood Thinner Instructions: Aspirin Instructions: Last Dose:  Activity level: Can go up a flight of stairs and activities of daily living without stopping and without chest pain and/or shortness of breath   Able to exercise without chest pain and/or shortness of breath   Unable to go up a flight of stairs without chest pain and/or shortness of breath     Anesthesia review: Hx: HTN,CAD  Patient denies shortness of breath, fever, cough and chest pain at PAT appointment   Patient verbalized understanding of instructions that were given to them at the PAT appointment. Patient was also instructed that they will need to review over the PAT instructions again at home before surgery.

## 2021-08-07 NOTE — H&P (Signed)
REFERRING PHYSICIAN: Martinique, Betty G, MD  PROVIDER: Beverlee Nims, MD  MRN: [id number] DOB: [birth year] DATE OF ENCOUNTER: 07/25/2021 Subjective   Chief Complaint: New Problem (Sebaceous Cyst upper left back )   History of Present Illness: Veronica [name] is a 71 y.o. female who is seen today as an office consultation for evaluation of New Problem (Sebaceous Cyst upper left back ) .   This is a 71 year old female was referred here for 2 separate chronically infected sebaceous cyst on her left upper back. She reports that they have been there approximately 5 years. They are slowly getting larger and intermittently become infected without drainage. They will be painful at times. She is otherwise without complaints. She has no previous history of similar cyst. She has no cardiopulmonary issues.  Review of Systems: A complete review of systems was obtained from the patient. I have reviewed this information and discussed as appropriate with the patient. See HPI as well for other ROS.  ROS   Medical History: Past Medical History:  Diagnosis Date  Anxiety  Hypertension   Patient Active Problem List  Diagnosis  Allergic rhinitis  Anterior chest wall pain  Anxiety disorder  Aortic atherosclerosis (CMS-HCC)  Constipation, chronic  Encounter for routine gynecological examination  Essential hypertension  Family history of heart disease  GERD (gastroesophageal reflux disease)  Hyperlipidemia with target LDL less than 100  Leukoplakia of vagina  Multiple thyroid nodules  Palpitations  Pain in joint, pelvic region and thigh  Vitamin D deficiency, unspecified   History reviewed. No pertinent surgical history.   No Known Allergies  Current Outpatient Medications on File Prior to Visit  Medication Sig Dispense Refill  amLODIPine (NORVASC) 5 MG tablet Take 1 tablet (5 mg total) by mouth every morning  losartan (COZAAR) 50 MG tablet Take 1 tablet (50 mg total) by mouth once  daily  pravastatin (PRAVACHOL) 80 MG tablet Take by mouth  sertraline (ZOLOFT) 25 MG tablet Take 1 tablet (25 mg total) by mouth once daily   No current facility-administered medications on file prior to visit.   Family History  Problem Relation Age of Onset  Hyperlipidemia (Elevated cholesterol) Mother  High blood pressure (Hypertension) Mother  Coronary Artery Disease (Blocked arteries around heart) Mother  Deep vein thrombosis (DVT or abnormal blood clot formation) Mother  Coronary Artery Disease (Blocked arteries around heart) Father  Deep vein thrombosis (DVT or abnormal blood clot formation) Father  High blood pressure (Hypertension) Sister  Hyperlipidemia (Elevated cholesterol) Sister  Deep vein thrombosis (DVT or abnormal blood clot formation) Sister  Hyperlipidemia (Elevated cholesterol) Brother  High blood pressure (Hypertension) Brother  Coronary Artery Disease (Blocked arteries around heart) Brother  Deep vein thrombosis (DVT or abnormal blood clot formation) Brother    Social History   Tobacco Use  Smoking Status Never  Smokeless Tobacco Never    Social History   Socioeconomic History  Marital status: Divorced  Tobacco Use  Smoking status: Never  Smokeless tobacco: Never  Substance and Sexual Activity  Alcohol use: Never  Drug use: Never   Objective:   Vitals:  07/25/21 1416  Weight: 93.4 kg (206 lb)  Height: 162.6 cm ('5\' 4"'$ )   Body mass index is 35.36 kg/m.  Physical Exam   She appears well on exam  The left upper back there are 2 separate chronically infected sebaceous cyst near each other. Both measure approximately 7 mm. There is still some mild erythema of the upper cyst  Labs, Imaging and Diagnostic Testing:  I reviewed her notes in epic  Assessment and Plan:   Diagnoses and all orders for this visit:  Infected sebaceous cyst of skin    At this point I discussion with the patient regarding the diagnosis. Because the 2 separate  cysts each measuring 7 mm both continue to have symptoms from chronic inflammation and infection, surgical excision is recommended for complete histologic evaluation and to prevent ongoing infections. She is eager to proceed with surgery. I discussed the risks which includes but is not limited to bleeding, infection, recurrence, etc. Surgery will be scheduled at her convenience.

## 2021-08-07 NOTE — Anesthesia Preprocedure Evaluation (Signed)
Anesthesia Evaluation  Patient identified by MRN, date of birth, ID band Patient awake    Reviewed: Allergy & Precautions, NPO status , Patient's Chart, lab work & pertinent test results  History of Anesthesia Complications Negative for: history of anesthetic complications  Airway Mallampati: II  TM Distance: >3 FB Neck ROM: Full    Dental no notable dental hx. (+) Dental Advisory Given   Pulmonary asthma ,    Pulmonary exam normal        Cardiovascular hypertension, Pt. on medications Normal cardiovascular exam     Neuro/Psych PSYCHIATRIC DISORDERS Anxiety Depression negative neurological ROS     GI/Hepatic Neg liver ROS, GERD  ,  Endo/Other  negative endocrine ROS  Renal/GU negative Renal ROS     Musculoskeletal negative musculoskeletal ROS (+)   Abdominal   Peds  Hematology negative hematology ROS (+)   Anesthesia Other Findings   Reproductive/Obstetrics                            Anesthesia Physical Anesthesia Plan  ASA: 2  Anesthesia Plan: MAC   Post-op Pain Management: Celebrex PO (pre-op)* and Tylenol PO (pre-op)*   Induction:   PONV Risk Score and Plan: 2 and Ondansetron, Propofol infusion and Midazolam  Airway Management Planned: Natural Airway, Simple Face Mask and Nasal Cannula  Additional Equipment:   Intra-op Plan:   Post-operative Plan:   Informed Consent: I have reviewed the patients History and Physical, chart, labs and discussed the procedure including the risks, benefits and alternatives for the proposed anesthesia with the patient or authorized representative who has indicated his/her understanding and acceptance.     Dental advisory given  Plan Discussed with: Anesthesiologist and CRNA  Anesthesia Plan Comments:        Anesthesia Quick Evaluation

## 2021-08-08 ENCOUNTER — Ambulatory Visit (HOSPITAL_BASED_OUTPATIENT_CLINIC_OR_DEPARTMENT_OTHER): Payer: Medicare PPO | Admitting: Certified Registered"

## 2021-08-08 ENCOUNTER — Other Ambulatory Visit: Payer: Self-pay

## 2021-08-08 ENCOUNTER — Ambulatory Visit (HOSPITAL_COMMUNITY): Payer: Medicare PPO | Admitting: Physician Assistant

## 2021-08-08 ENCOUNTER — Encounter (HOSPITAL_COMMUNITY)
Admission: RE | Disposition: A | Discharge status: Home / Self Care | Payer: Self-pay | Source: Ambulatory Visit | Attending: Surgery

## 2021-08-08 ENCOUNTER — Encounter (HOSPITAL_COMMUNITY): Payer: Self-pay | Admitting: Surgery

## 2021-08-08 ENCOUNTER — Ambulatory Visit (HOSPITAL_COMMUNITY)
Admission: RE | Admit: 2021-08-08 | Discharge: 2021-08-08 | Disposition: A | Discharge status: Home / Self Care | Payer: Medicare PPO | Source: Ambulatory Visit | Attending: Surgery | Admitting: Surgery

## 2021-08-08 DIAGNOSIS — K219 Gastro-esophageal reflux disease without esophagitis: Secondary | ICD-10-CM | POA: Insufficient documentation

## 2021-08-08 DIAGNOSIS — J45909 Unspecified asthma, uncomplicated: Secondary | ICD-10-CM

## 2021-08-08 DIAGNOSIS — I10 Essential (primary) hypertension: Secondary | ICD-10-CM | POA: Diagnosis not present

## 2021-08-08 DIAGNOSIS — F418 Other specified anxiety disorders: Secondary | ICD-10-CM | POA: Diagnosis not present

## 2021-08-08 DIAGNOSIS — M7989 Other specified soft tissue disorders: Secondary | ICD-10-CM | POA: Diagnosis not present

## 2021-08-08 DIAGNOSIS — L720 Epidermal cyst: Secondary | ICD-10-CM | POA: Diagnosis not present

## 2021-08-08 DIAGNOSIS — L723 Sebaceous cyst: Secondary | ICD-10-CM

## 2021-08-08 DIAGNOSIS — F32A Depression, unspecified: Secondary | ICD-10-CM | POA: Insufficient documentation

## 2021-08-08 DIAGNOSIS — D171 Benign lipomatous neoplasm of skin and subcutaneous tissue of trunk: Secondary | ICD-10-CM | POA: Diagnosis not present

## 2021-08-08 DIAGNOSIS — F419 Anxiety disorder, unspecified: Secondary | ICD-10-CM | POA: Diagnosis not present

## 2021-08-08 DIAGNOSIS — L089 Local infection of the skin and subcutaneous tissue, unspecified: Secondary | ICD-10-CM | POA: Diagnosis not present

## 2021-08-08 HISTORY — PX: CYST EXCISION: SHX5701

## 2021-08-08 SURGERY — CYST REMOVAL
Anesthesia: Monitor Anesthesia Care | Laterality: Left

## 2021-08-08 MED ORDER — MIDAZOLAM HCL 5 MG/5ML IJ SOLN
INTRAMUSCULAR | Status: DC | PRN
  Administered 2021-08-08: 1 mg via INTRAVENOUS

## 2021-08-08 MED ORDER — AMISULPRIDE (ANTIEMETIC) 5 MG/2ML IV SOLN: 10.0000 mg | Freq: Once | INTRAVENOUS | Status: DC | PRN

## 2021-08-08 MED ORDER — FENTANYL CITRATE PF 50 MCG/ML IJ SOSY: 25.0000 ug | PREFILLED_SYRINGE | INTRAMUSCULAR | Status: DC | PRN

## 2021-08-08 MED ORDER — DEXMEDETOMIDINE HCL IN NACL 80 MCG/20ML IV SOLN
INTRAVENOUS | Status: AC
  Filled 2021-08-08: qty 20

## 2021-08-08 MED ORDER — CELECOXIB 200 MG PO CAPS
200.0000 mg | ORAL_CAPSULE | Freq: Once | ORAL | Status: AC
  Administered 2021-08-08: 200 mg via ORAL
  Filled 2021-08-08: qty 1

## 2021-08-08 MED ORDER — PROPOFOL 10 MG/ML IV BOLUS
INTRAVENOUS | Status: AC
  Filled 2021-08-08: qty 20

## 2021-08-08 MED ORDER — PROPOFOL 500 MG/50ML IV EMUL
INTRAVENOUS | Status: AC
  Filled 2021-08-08: qty 50

## 2021-08-08 MED ORDER — LIDOCAINE-EPINEPHRINE (PF) 1 %-1:200000 IJ SOLN
INTRAMUSCULAR | Status: AC
  Filled 2021-08-08: qty 30

## 2021-08-08 MED ORDER — LIDOCAINE HCL (PF) 2 % IJ SOLN
INTRAMUSCULAR | Status: AC
  Filled 2021-08-08: qty 5

## 2021-08-08 MED ORDER — LIDOCAINE-EPINEPHRINE 1 %-1:100000 IJ SOLN
INTRAMUSCULAR | Status: DC | PRN
  Administered 2021-08-08: 10 mL

## 2021-08-08 MED ORDER — LACTATED RINGERS IV SOLN: INTRAVENOUS | Status: DC

## 2021-08-08 MED ORDER — FENTANYL CITRATE (PF) 100 MCG/2ML IJ SOLN
INTRAMUSCULAR | Status: AC
  Filled 2021-08-08: qty 2

## 2021-08-08 MED ORDER — CHLORHEXIDINE GLUCONATE CLOTH 2 % EX PADS: 6.0000 | MEDICATED_PAD | Freq: Once | CUTANEOUS | Status: DC

## 2021-08-08 MED ORDER — PROPOFOL 10 MG/ML IV BOLUS
INTRAVENOUS | Status: DC | PRN
  Administered 2021-08-08 (×3): 20 mg via INTRAVENOUS
  Administered 2021-08-08: 10 mg via INTRAVENOUS
  Administered 2021-08-08: 30 mg via INTRAVENOUS

## 2021-08-08 MED ORDER — MIDAZOLAM HCL 2 MG/2ML IJ SOLN
INTRAMUSCULAR | Status: AC
Start: 2021-08-08 — End: ?
  Filled 2021-08-08: qty 2

## 2021-08-08 MED ORDER — BUPIVACAINE-EPINEPHRINE (PF) 0.25% -1:200000 IJ SOLN
INTRAMUSCULAR | Status: AC
  Filled 2021-08-08: qty 30

## 2021-08-08 MED ORDER — LIDOCAINE HCL (CARDIAC) PF 100 MG/5ML IV SOSY
PREFILLED_SYRINGE | INTRAVENOUS | Status: DC | PRN
  Administered 2021-08-08: 60 mg via INTRAVENOUS

## 2021-08-08 MED ORDER — TRAMADOL HCL 50 MG PO TABS: 50.0000 mg | ORAL_TABLET | Freq: Four times a day (QID) | ORAL | 0 refills | Status: DC | PRN

## 2021-08-08 MED ORDER — PROMETHAZINE HCL 25 MG/ML IJ SOLN: 6.2500 mg | INTRAMUSCULAR | Status: DC | PRN

## 2021-08-08 MED ORDER — ORAL CARE MOUTH RINSE: 15.0000 mL | Freq: Once | OROMUCOSAL | Status: AC

## 2021-08-08 MED ORDER — ACETAMINOPHEN 500 MG PO TABS
1000.0000 mg | ORAL_TABLET | Freq: Once | ORAL | Status: AC
  Administered 2021-08-08: 1000 mg via ORAL
  Filled 2021-08-08: qty 2

## 2021-08-08 MED ORDER — DEXMEDETOMIDINE (PRECEDEX) IN NS 20 MCG/5ML (4 MCG/ML) IV SYRINGE
PREFILLED_SYRINGE | INTRAVENOUS | Status: DC | PRN
  Administered 2021-08-08: 4 ug via INTRAVENOUS

## 2021-08-08 MED ORDER — CEFAZOLIN SODIUM-DEXTROSE 2-4 GM/100ML-% IV SOLN
2.0000 g | INTRAVENOUS | Status: AC
  Administered 2021-08-08: 2 g via INTRAVENOUS
  Filled 2021-08-08: qty 100

## 2021-08-08 MED ORDER — 0.9 % SODIUM CHLORIDE (POUR BTL) OPTIME
TOPICAL | Status: DC | PRN
  Administered 2021-08-08: 1000 mL

## 2021-08-08 MED ORDER — ONDANSETRON HCL 4 MG/2ML IJ SOLN
INTRAMUSCULAR | Status: DC | PRN
  Administered 2021-08-08: 4 mg via INTRAVENOUS

## 2021-08-08 MED ORDER — FENTANYL CITRATE (PF) 100 MCG/2ML IJ SOLN
INTRAMUSCULAR | Status: DC | PRN
  Administered 2021-08-08: 25 ug via INTRAVENOUS

## 2021-08-08 MED ORDER — CHLORHEXIDINE GLUCONATE 0.12 % MT SOLN
15.0000 mL | Freq: Once | OROMUCOSAL | Status: AC
  Administered 2021-08-08: 15 mL via OROMUCOSAL

## 2021-08-08 SURGICAL SUPPLY — 27 items
BAG COUNTER SPONGE SURGICOUNT (BAG) IMPLANT
BENZOIN TINCTURE PRP APPL 2/3 (GAUZE/BANDAGES/DRESSINGS) IMPLANT
BLADE SURG 15 STRL LF DISP TIS (BLADE) ×1 IMPLANT
BLADE SURG 15 STRL SS (BLADE) ×2
CHLORAPREP W/TINT 26 (MISCELLANEOUS) ×1 IMPLANT
DERMABOND ADVANCED (GAUZE/BANDAGES/DRESSINGS) ×1
DERMABOND ADVANCED .7 DNX12 (GAUZE/BANDAGES/DRESSINGS) IMPLANT
DRAPE LAPAROSCOPIC ABDOMINAL (DRAPES) ×1 IMPLANT
DRAPE LAPAROTOMY T 98X78 PEDS (DRAPES) IMPLANT
ELECT REM PT RETURN 15FT ADLT (MISCELLANEOUS) ×2 IMPLANT
GAUZE SPONGE 4X4 12PLY STRL (GAUZE/BANDAGES/DRESSINGS) ×1 IMPLANT
GLOVE BIO SURGEON STRL SZ7.5 (GLOVE) ×2 IMPLANT
GOWN STRL REUS W/ TWL XL LVL3 (GOWN DISPOSABLE) ×2 IMPLANT
GOWN STRL REUS W/TWL XL LVL3 (GOWN DISPOSABLE) ×4
KIT BASIN OR (CUSTOM PROCEDURE TRAY) ×2 IMPLANT
KIT TURNOVER KIT A (KITS) ×1 IMPLANT
NDL HYPO 25X1 1.5 SAFETY (NEEDLE) ×1 IMPLANT
NEEDLE HYPO 25X1 1.5 SAFETY (NEEDLE) ×2 IMPLANT
PACK BASIC VI WITH GOWN DISP (CUSTOM PROCEDURE TRAY) ×2 IMPLANT
PENCIL SMOKE EVACUATOR (MISCELLANEOUS) ×1 IMPLANT
SPIKE FLUID TRANSFER (MISCELLANEOUS) ×1 IMPLANT
STRIP CLOSURE SKIN 1/2X4 (GAUZE/BANDAGES/DRESSINGS) IMPLANT
SUT MNCRL AB 4-0 PS2 18 (SUTURE) ×2 IMPLANT
SUT VIC AB 3-0 SH 27 (SUTURE) ×2
SUT VIC AB 3-0 SH 27XBRD (SUTURE) ×1 IMPLANT
SYR CONTROL 10ML LL (SYRINGE) ×3 IMPLANT
TOWEL OR 17X26 10 PK STRL BLUE (TOWEL DISPOSABLE) ×2 IMPLANT

## 2021-08-08 NOTE — Anesthesia Postprocedure Evaluation (Signed)
Anesthesia Post Note  Patient: Veronica [name]  Procedure(s) Performed: EXCISION LEFT UPPER BACK SEBACEOUS CYST X2 (Left)     Patient location during evaluation: PACU Anesthesia Type: MAC Level of consciousness: awake and alert Pain management: pain level controlled Vital Signs Assessment: post-procedure vital signs reviewed and stable Respiratory status: spontaneous breathing and respiratory function stable Cardiovascular status: stable Postop Assessment: no apparent nausea or vomiting Anesthetic complications: no   No notable events documented.  Last Vitals:  Vitals:   08/08/21 0815 08/08/21 0830  BP: 118/87 131/68  Pulse: 65 61  Resp: 18 16  Temp: 36.6 C   SpO2: 100% 98%    Last Pain:  Vitals:   08/08/21 0830  TempSrc:   PainSc: 0-No pain                 [name]

## 2021-08-08 NOTE — Discharge Instructions (Signed)
Ok to shower starting tomorrow   Ice pack, Tylenol, and ibuprofen also for pain   No vigorous activity for 1 week

## 2021-08-08 NOTE — Op Note (Signed)
EXCISION LEFT UPPER BACK SEBACEOUS CYST X2  Procedure Note  Veronica [name] 08/08/2021   Pre-op Diagnosis: LEFT UPPER BACK CHRONICALLY INFECTED SEBACEOUS CYST x2     Post-op Diagnosis: same  Procedure(s): EXCISION LEFT UPPER BACK SEBACEOUS CYST X2 (each is 7 mm)  Surgeon(s): Coralie Keens, MD  Anesthesia: Monitor Anesthesia Care  Staff:  Circulator: Reeves Dam, RN Scrub Person: Cleda Clarks, RN; Lois Huxley  Estimated Blood Loss: Minimal               Specimens: sent to path  Findings: two chronically infected sebaceous cyst (epidermal inclusion cyst) on her back each measuring 7 mm approximately  Procedure: The patient was brought to operating identifies a correct patient.  She was placed on the operating room table and then turned into the right lateral decubitus position.  Her left upper back was then prepped and draped in usual sterile fashion.  I had already marked both locations of the sebaceous cyst.  These were visible.  After induction of anesthesia I anesthetized both areas with 1% lidocaine with epinephrine.  I made 2 separate skin incisions and were able to excise the cyst in their entirety.  I took an ellipse of skin on the upper incision to as it was much closer to the skin.  Both cysts were sent together to pathology for evaluation.  Clinically, they appeared consistent with sebaceous cyst/epidermal inclusion cyst. I achieved hemostasis with the cautery.  I then closed both incisions with interrupted 3-0 Vicryl sutures and running 4-0 Monocryl sutures.  Dermabond was then applied.  The patient tolerated the procedure well.  All the counts were correct at the end of the procedure.  The patient was then taken in a stable condition from the operating room to the recovery room.          Coralie Keens   Date: 08/08/2021  Time: 7:53 AM

## 2021-08-08 NOTE — Transfer of Care (Signed)
Immediate Anesthesia Transfer of Care Note  Patient: Veronica [name]  Procedure(s) Performed: EXCISION LEFT UPPER BACK SEBACEOUS CYST X2 (Left)  Patient Location: PACU  Anesthesia Type:MAC  Level of Consciousness: awake, alert  and oriented  Airway & Oxygen Therapy: Patient Spontanous Breathing  Post-op Assessment: Report given to RN and Post -op Vital signs reviewed and stable  Post vital signs: Reviewed and stable  Last Vitals:  Vitals Value Taken Time  BP 119/68 08/08/21 0752  Temp    Pulse 70 08/08/21 0753  Resp    SpO2 99 % 08/08/21 0753  Vitals shown include unvalidated device data.  Last Pain:  Vitals:   08/08/21 0627  TempSrc:   PainSc: 0-No pain      Patients Stated Pain Goal: 3 (74/[id number])  Complications: No notable events documented.

## 2021-08-08 NOTE — Interval H&P Note (Signed)
History and Physical Interval Note: no change in H and P  08/08/2021 7:01 AM  Clemens Catholic  has presented today for surgery, with the diagnosis of LEFT UPPER BACK CHRONICALLY INFECTED SEBACEOUS CYST x2.  The various methods of treatment have been discussed with the patient and family. After consideration of risks, benefits and other options for treatment, the patient has consented to  Procedure(s): EXCISION LEFT UPPER BACK SEBACEOUS CYST X2 (Left) as a surgical intervention.  The patient's history has been reviewed, patient examined, no change in status, stable for surgery.  I have reviewed the patient's chart and labs.  Questions were answered to the patient's satisfaction.     Coralie Keens

## 2021-08-09 ENCOUNTER — Encounter (HOSPITAL_COMMUNITY): Payer: Self-pay | Admitting: Surgery

## 2021-08-09 LAB — SURGICAL PATHOLOGY

## 2021-08-14 ENCOUNTER — Encounter: Payer: Self-pay | Admitting: Family Medicine

## 2021-08-14 ENCOUNTER — Telehealth (INDEPENDENT_AMBULATORY_CARE_PROVIDER_SITE_OTHER): Payer: Medicare PPO | Admitting: Family Medicine

## 2021-08-14 ENCOUNTER — Telehealth: Payer: Self-pay | Admitting: Family Medicine

## 2021-08-14 VITALS — HR 71 | Ht 65.0 in

## 2021-08-14 DIAGNOSIS — F411 Generalized anxiety disorder: Secondary | ICD-10-CM

## 2021-08-14 DIAGNOSIS — I10 Essential (primary) hypertension: Secondary | ICD-10-CM

## 2021-08-14 DIAGNOSIS — R6889 Other general symptoms and signs: Secondary | ICD-10-CM

## 2021-08-14 DIAGNOSIS — J4520 Mild intermittent asthma, uncomplicated: Secondary | ICD-10-CM

## 2021-08-14 DIAGNOSIS — R079 Chest pain, unspecified: Secondary | ICD-10-CM | POA: Diagnosis not present

## 2021-08-14 DIAGNOSIS — K219 Gastro-esophageal reflux disease without esophagitis: Secondary | ICD-10-CM | POA: Diagnosis not present

## 2021-08-14 MED ORDER — OMEPRAZOLE 40 MG PO CPDR: 40.0000 mg | DELAYED_RELEASE_CAPSULE | Freq: Every day | ORAL | 0 refills | Status: DC

## 2021-08-14 NOTE — Telephone Encounter (Signed)
Patient was sent to triage after calling about having SOB in heat. Previously had a procedure Monday and was in the car for an extended period of time after driving back from [place]. Nurse gave ER outcome and patient refused.    Please advise

## 2021-08-14 NOTE — Assessment & Plan Note (Addendum)
BP readings elevated at home. Recommend increasing dose of losartan from 50 mg to 100 mg. Continue amlodipine 10 mg daily and low-salt diet. Continue monitoring BP regularly. Follow-up in 6 weeks, before if needed.

## 2021-08-14 NOTE — Assessment & Plan Note (Addendum)
This problem could be causing CP she is reporting. Recommend starting omeprazole 40 mg 30 minutes before breakfast for 6 to 8 weeks. GERD precautions. Instructed to let me know if symptoms are not greatly improved in 2 to 3 weeks, in which case we will consider a different PPI. If symptoms are persistent, GI evaluation may be necessary.  Instructed about warning signs.

## 2021-08-14 NOTE — Progress Notes (Signed)
Virtual Visit via Video Note I connected with Veronica [name] on 08/14/21 by a video enabled telemedicine application and verified that I am speaking with the correct person using two identifiers.  Location patient: home Location provider:work office Persons participating in the virtual visit: patient, provider  I discussed the limitations of evaluation and management by telemedicine and the availability of in person appointments. The patient expressed understanding and agreed to proceed.  Chief Complaint  Patient presents with   Allergies   Asthma   HPI: Veronica [name] is a 71 yo female with hx of asthma, seasonal allergies, thyroid nodule, vitamin D deficiency, hypertension, hyperlipidemia, and anxiety complaining of worsening allergies. C/o SOB, cannot get enough air when she is exposed to heat and humidity, it is most frequent when she is outdoors during hot weather. Problem is alleviated by taking a shower, increasing water intake,and air conditioner.  She feels fatigue for the rest of the day and frequently she has to lie down. States that this has happened for years during summers. SBP elevated during episodes, 150's/80's. Also exacerbated anxiety, she feels like she cannot breath, like is is "going to die."  She thinks symptoms are caused by allergies, she has an appointment with immunologist on 09/04/2021. She is also inquiring about supplemental O2 to use when she is having symptoms. Pulse ox today 98%.  Asthma: Last appt with pulmonologist on 03/01/21. She is on Breo Ellipta 200-25 mg 1 puff daily and albuterol inhaler 2 puff every 6 hours as needed. Negative for cough and wheezing. No hx of tobacco use.  Allergy rhinitis on Flonase nasal spray daily as needed. She has not noted nasal congestion, rhinorrhea, or nausea/eye pruritus. Negative for skin rash.  In average her BP has been 145-148/70-80's. Today 150/70. Negative for severe/frequent headache, visual changes, palpitation,  focal weakness, or edema. Currently she is on losartan 50 mg daily and amlodipine 10 mg daily.  Lab Results  Component Value Date   CREATININE 0.63 08/04/2021   BUN 9 08/04/2021   NA 136 08/04/2021   K 4.3 08/04/2021   CL 103 08/04/2021   CO2 25 08/04/2021   -Easy satiety, epigastric pain, and retrosternal chest pain.  CP is not radiated and no associated with SOB, palpitation, or diaphoresis. Problem has been going on for a few days. Feels like there "is not enough space" for food to pass through trachea, mild dysphagia. + Burping. Negative for voice changes.  She follows with cardiologist, CT coronary calcium score done in 03/2021 was 28.4 which is 70 th percentile for age/sex. Negative for nausea, vomiting, melena, blood in the stool, or urinary symptoms. Constipation, having small or hard bowel movements every 1 to 2 days. She just got OTC Dulcolax. Last bowel movement this morning. Colonoscopy on 02/02/2014.  ROS: See pertinent positives and negatives per HPI.  Past Medical History:  Diagnosis Date   Allergic rhinitis    Anxiety and depression    Asthma    Constipation, chronic    Coronary artery calcification seen on CAT scan 06/2010   Mild nonobstructive CAD on cardiac CT  -as of 2021, coronary calcium score 34   Dyspnea    Hemorrhoids    Hip pain, bilateral    Hyperlipidemia    Hypertension    Leukoplakia of vagina    Low back pain    w/ left side radiculopathy, s/p injection at L4-L5 forarmen 1/06   Uterine fibroid    Past Surgical History:  Procedure Laterality Date  ABDOMINAL HYSTERECTOMY  11-2000   no oophorectomy per pt   CHOLECYSTECTOMY  12/08/2010   Procedure: LAPAROSCOPIC CHOLECYSTECTOMY WITH INTRAOPERATIVE CHOLANGIOGRAM;  Surgeon: Pedro Earls, MD;  Location: [facility];  Service: General;  Laterality: N/A;  c-arm    COLONOSCOPY WITH PROPOFOL N/A 02/02/2014   Procedure: COLONOSCOPY WITH PROPOFOL;  Surgeon: Garlan Fair, MD;  Location: [facility];   Service: Endoscopy;  Laterality: N/A;   CORONARY CALCIUM SCORE  12/2018   Coronary calcium score-34   CORONARY CT ANGIOGRAM  06/2010    Coronary calcium score 12.  Mild nonobstructive disease.  Ostial LAD and first D1/RI.  Right dominant.  Focal scarring and bronchiectasis of medial right lower lobe.   CYST EXCISION Left 08/08/2021   Procedure: EXCISION LEFT UPPER BACK SEBACEOUS CYST X2;  Surgeon: Coralie Keens, MD;  Location: [facility];  Service: General;  Laterality: Left;   LEEP     unknown date    NM MYOVIEW LTD  10/2010   Nonischemic.  Normal EF.   TUBAL LIGATION     bilateral 1989   UTERINE FIBROID SURGERY     Family History  Problem Relation Age of Onset   Schizophrenia Son    Schizophrenia Brother    CVA Brother    Valvular heart disease Brother        Valve Surgery   Schizophrenia Other        2 nieces   Hypertension Mother    Hyperlipidemia Mother    Heart attack Mother 55   Heart disease Mother    Anxiety disorder Mother    Hypertension Father    Hyperlipidemia Father    AAA (abdominal aortic aneurysm) Father 61   Heart disease Father    Heart disease Brother        Vavle surgery   Diabetes Neg Hx    Colon cancer Neg Hx    Breast cancer Neg Hx    Social History   Socioeconomic History   Marital status: Divorced    Spouse name: Not on file   Number of children: 3   Years of education: Not on file   Highest education level: Not on file  Occupational History   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   Social Determinants of Health   Financial Resource Strain: Low Risk  (09/14/2020)   Overall Financial Resource Strain (CARDIA)    Difficulty of Paying Living Expenses: Not hard at all  Food Insecurity: No Food Insecurity (09/14/2020)   Hunger Vital Sign    Worried About Running Out of Food in the Last Year: Never true    Ran Out of Food in the Last Year: Never true  Transportation Needs: No Transportation Needs (09/14/2020)   PRAPARE - Hydrologist (Medical): No    Lack of Transportation (Non-Medical): No  Physical Activity: Sufficiently Active (09/14/2020)  Exercise Vital Sign    Days of Exercise per Week: 5 days    Minutes of Exercise per Session: 30 min  Stress: No Stress Concern Present (09/14/2020)   [place]    Feeling of Stress : Not at all  Social Connections: Moderately Integrated (09/14/2020)   Social Connection and Isolation Panel [NHANES]    Frequency of Communication with Friends and Family: More than three times a week    Frequency of Social Gatherings with Friends and Family: More than three times a week    Attends Religious Services: 1 to 4 times per year    Active Member of [employer] or Organizations: Yes    Attends Archivist Meetings: 1 to 4 times per year    Marital Status: Divorced  Intimate Partner Violence: Not At Risk (09/14/2020)   Humiliation, Afraid, Rape, and Kick questionnaire    Fear of Current or Ex-Partner: No    Emotionally Abused: No    Physically Abused: No    Sexually Abused: No   Current Outpatient Medications:    acetaminophen (TYLENOL) 500 MG tablet, Take 500-1,000 mg by mouth every 6 (six) hours as needed (pain.)., Disp: , Rfl:    albuterol (VENTOLIN HFA) 108 (90 Base) MCG/ACT inhaler, TAKE 2 PUFFS BY MOUTH EVERY 6 HOURS AS NEEDED FOR WHEEZE OR SHORTNESS OF BREATH, Disp: 6.7 each, Rfl: 0    ALPRAZolam (XANAX) 0.25 MG tablet, TAKE 1 TABLET (0.25 MG TOTAL) BY MOUTH AT BEDTIME AS NEEDED FOR ANXIETY OR SLEEP., Disp: 30 tablet, Rfl: 1   amLODipine (NORVASC) 10 MG tablet, Take 1 tablet (10 mg total) by mouth daily. (Patient taking differently: Take 10 mg by mouth at bedtime.), Disp: 30 tablet, Rfl: 3   BREO ELLIPTA 200-25 MCG/ACT AEPB, TAKE 1 PUFF BY MOUTH EVERY DAY, Disp: 60 each, Rfl: 11   fluticasone (FLONASE) 50 MCG/ACT nasal spray, PLACE 1 SPRAY INTO BOTH NOSTRILS 2 (TWO) TIMES DAILY (Patient taking differently: Place 1-2 sprays into both nostrils daily as needed for allergies or rhinitis.), Disp: 16 mL, Rfl: 2   losartan (COZAAR) 50 MG tablet, Take 50 mg by mouth daily., Disp: , Rfl:    rosuvastatin (CRESTOR) 20 MG tablet, Take 1 tablet (20 mg total) by mouth daily. (Patient taking differently: Take 20 mg by mouth every evening.), Disp: 90 tablet, Rfl: 3   traMADol (ULTRAM) 50 MG tablet, Take 1 tablet (50 mg total) by mouth every 6 (six) hours as needed for moderate pain or severe pain., Disp: 25 tablet, Rfl: 0   TRYPTOPHAN PO, Take 1 tablet by mouth at bedtime. Tryptophan with Magnesium + Vitamin B6, Disp: , Rfl:    valsartan (DIOVAN) 80 MG tablet, Take 1 tablet (80 mg total) by mouth daily., Disp: 30 tablet, Rfl: 11  EXAM:  VITALS per patient if applicable:Pulse 71   Ht '5\' 5"'$  (1.651 m)   SpO2 98%   BMI 33.45 kg/m   GENERAL: alert, oriented, appears well and in no acute distress  HEENT: atraumatic, conjunctiva clear, no obvious abnormalities on inspection of external nose and ears  NECK: normal movements of the head and neck  LUNGS: on inspection no signs of respiratory distress, breathing rate appears normal, no obvious gross SOB, gasping or wheezing  CV: no obvious cyanosis  MS: moves all visible extremities without noticeable abnormality  PSYCH/NEURO: pleasant and cooperative, + Anxious and crying when reporting symptoms. Speech and thought processing grossly  intact  ASSESSMENT AND  PLAN:  Discussed the following assessment and plan:  Heat sensitivity Symptoms are not as severe as they would be in cases of heat stroke or exhaustion but heat exposure seems to be responsible for SOB,?  Dehydration.  I do not think it is caused by allergies but still recommend keeping appointment with immunologist. Instructed to avoid being outdoors around noon, prefer early am and night. Stressed the importance of adequate hydration. Clearly instructed about warning signs.  Essential hypertension BP readings elevated at home. Recommend increasing dose of losartan from 50 mg to 100 mg. Continue amlodipine 10 mg daily and low-salt diet. Continue monitoring BP regularly. Follow-up in 6 weeks, before if needed.  Anxiety disorder Problem is being aggravated by recent health issues. For now continue alprazolam 0.25 mg daily as needed. We will consider adding a SSRI if problem does not improve.  GERD (gastroesophageal reflux disease) This problem could be causing CP she is reporting. Recommend starting omeprazole 40 mg 30 minutes before breakfast for 6 to 8 weeks. GERD precautions. Instructed to let me know if symptoms are not greatly improved in 2 to 3 weeks, in which case we will consider a different PPI. If symptoms are persistent, GI evaluation may be necessary.  Instructed about warning signs.  Chest pain, unspecified type We discussed possible etiologies. CP in combination with other symptoms reported today suggest GI etiology. Recommend arranging appointment with cardiologist sooner than planned if CP is not improved with PPI or gets worse. Instructed about warning signs.  I spent a total of 40 minutes in both face to face and non face to face activities for this visit on the date of this encounter. During this time history was obtained and documented, examination was performed, prior labs/imaging reviewed, and assessment/plan discussed.  We  discussed possible serious and likely etiologies, options for evaluation and workup, limitations of telemedicine visit vs in person visit, treatment, treatment risks and precautions. The patient was advised to call back or seek an in-person evaluation if the symptoms worsen or if the condition fails to improve as anticipated. I discussed the assessment and treatment plan with the patient. The patient was provided an opportunity to ask questions and all were answered. The patient agreed with the plan and demonstrated an understanding of the instructions.  Return in about 6 weeks (around 09/25/2021).  Trevious Rampey G. Martinique, MD  [facility] [place] office.

## 2021-08-14 NOTE — Telephone Encounter (Addendum)
I called and spoke with patient. Her allergies & asthma are getting worse with the weather. She is wanting to get an appointment with the [facility]. Patient did not sound short of breath on the phone; when asked, she stated it was her allergies & asthma acting up. Patient requested a virtual appointment with pcp in the meantime, appointment made for today at 4:30.

## 2021-08-14 NOTE — Addendum Note (Signed)
Addended by: Nathanial Millman E on: 08/14/2021 10:10 AM   Modules accepted: Orders

## 2021-08-14 NOTE — Assessment & Plan Note (Signed)
Problem is being aggravated by recent health issues. For now continue alprazolam 0.25 mg daily as needed. We will consider adding a SSRI if problem does not improve.

## 2021-08-14 NOTE — Addendum Note (Signed)
Addended by: Martinique, Deloma Spindle G on: 08/14/2021 05:45 PM   Modules accepted: Level of Service

## 2021-08-14 NOTE — Telephone Encounter (Signed)
--  Caller states she has been having allergy problems. She is having SOB. caller states that SOB when she goes outside. Was SOB when going outside walking. Wants an appt to get an allergy referral.  08/14/2021 9:34:55 AM Go to ED Now Rolin Barry, RN, [facility]  Comments User: Saverio Danker, RN Date/Time Eilene Ghazi Time): 08/14/2021 9:28:12 AM Current o2 sat , 97% on RA.  User: Saverio Danker, RN Date/Time Eilene Ghazi Time): 08/14/2021 9:32:21 AM Caller states that she sees a cardiology.  User: Saverio Danker, RN Date/Time Eilene Ghazi Time): 08/14/2021 9:34:41 AM Caller thinks that she knows that it is the humidity. Went to [place], long car ride , 3 weeks ago. Had 2 cysts removed last Monday. Caller has allergy appt in August.  User: Saverio Danker, RN Date/Time Eilene Ghazi Time): 08/14/2021 9:42:30 AM Reached Selena at the office. Gave her the name of the caller and information and refusal. She sent a message to the MD , high priority.  Referrals GO TO FACILITY REFUSED

## 2021-08-18 ENCOUNTER — Other Ambulatory Visit: Payer: Self-pay | Admitting: Nurse Practitioner

## 2021-08-18 ENCOUNTER — Other Ambulatory Visit: Payer: Self-pay | Admitting: Family Medicine

## 2021-09-04 DIAGNOSIS — J383 Other diseases of vocal cords: Secondary | ICD-10-CM | POA: Diagnosis not present

## 2021-09-04 DIAGNOSIS — R0602 Shortness of breath: Secondary | ICD-10-CM | POA: Diagnosis not present

## 2021-09-06 ENCOUNTER — Encounter (INDEPENDENT_AMBULATORY_CARE_PROVIDER_SITE_OTHER): Payer: Self-pay

## 2021-09-08 ENCOUNTER — Ambulatory Visit (INDEPENDENT_AMBULATORY_CARE_PROVIDER_SITE_OTHER): Payer: Medicare PPO | Admitting: Family Medicine

## 2021-09-08 ENCOUNTER — Encounter: Payer: Self-pay | Admitting: Family Medicine

## 2021-09-08 VITALS — BP 122/70 | HR 79 | Temp 98.3°F | Resp 12 | Ht 65.0 in | Wt 200.1 lb

## 2021-09-08 DIAGNOSIS — R1013 Epigastric pain: Secondary | ICD-10-CM | POA: Diagnosis not present

## 2021-09-08 DIAGNOSIS — F411 Generalized anxiety disorder: Secondary | ICD-10-CM

## 2021-09-08 DIAGNOSIS — Z1211 Encounter for screening for malignant neoplasm of colon: Secondary | ICD-10-CM

## 2021-09-08 DIAGNOSIS — R490 Dysphonia: Secondary | ICD-10-CM

## 2021-09-08 DIAGNOSIS — K219 Gastro-esophageal reflux disease without esophagitis: Secondary | ICD-10-CM

## 2021-09-08 DIAGNOSIS — R6883 Chills (without fever): Secondary | ICD-10-CM

## 2021-09-08 LAB — TSH: TSH: 0.66 u[IU]/mL (ref 0.35–5.50)

## 2021-09-08 MED ORDER — SUCRALFATE 1 G PO TABS: 1.0000 g | ORAL_TABLET | Freq: Three times a day (TID) | ORAL | 2 refills | Status: DC

## 2021-09-08 NOTE — Progress Notes (Unsigned)
ACUTE VISIT Chief Complaint  Patient presents with   Abdominal Pain    X 2 months   HPI: Veronica [name] is a 71 y.o. female with hx of HTN,anxiety,GERD,chronic back pain, and allergies here today complaining of abdominal pain as described above. Requesting referral to gastroenterologist.  Abdominal Pain This is a recurrent problem. The current episode started more than 1 month ago. The onset quality is gradual. The problem occurs intermittently. The problem has been unchanged. The pain is located in the epigastric region. The pain is moderate. The quality of the pain is a sensation of fullness. The abdominal pain radiates to the epigastric region. Associated symptoms include belching and constipation (chronic, miralax helps.). Pertinent negatives include no dysuria, fever, flatus, frequency, headaches, hematochezia, hematuria, melena, nausea, vomiting or weight loss. The pain is aggravated by eating. The pain is relieved by Nothing. She has tried proton pump inhibitors for the symptoms. The treatment provided mild relief. Her past medical history is significant for GERD.  Omeprazole helped some.  Since her last visit she has seen immunologist. According to pt, she was told she does not have asthma, she does have allergies. She was recommended to have ENT look at her vocal cords, she would like a referral. Occasional dysphonia and dysphagia. Negative for cough,DOE,or wheezing. Feels like she cannot get enough air, worse with hot and humid days. She has had cardiac and pulmonologist evaluation, otherwise negative work up.  Last colonoscopy in 01/2014, 5 years f/u was recommended.  C/O having chills. Negative for fever,worsening fatigue,or changes in appetite.  Lab Results  Component Value Date   TSH 1.040 10/13/2020   Lab Results  Component Value Date   WBC 6.2 08/04/2021   HGB 13.1 08/04/2021   HCT 39.4 08/04/2021   MCV 89.5 08/04/2021   PLT 240 08/04/2021   Lab Results   Component Value Date   CREATININE 0.63 08/04/2021   BUN 9 08/04/2021   NA 136 08/04/2021   K 4.3 08/04/2021   CL 103 08/04/2021   CO2 25 08/04/2021   GAD: She does not feel like problem is getting worse. Sheis on Alprazolam 0.25 mg daily as needed. Took Fluoxetine 20 mg , felt like it was affecting her BP.  Review of Systems  Constitutional:  Positive for fatigue. Negative for chills, fever and weight loss.  HENT:  Negative for mouth sores, rhinorrhea and sore throat.   Gastrointestinal:  Positive for abdominal pain and constipation (chronic, miralax helps.). Negative for flatus, hematochezia, melena, nausea and vomiting.  Genitourinary:  Negative for dysuria, frequency and hematuria.  Neurological:  Negative for syncope, weakness and headaches.  Psychiatric/Behavioral:  Negative for confusion. The patient is nervous/anxious.   Rest see pertinent positives and negatives per HPI.  Current Outpatient Medications on File Prior to Visit  Medication Sig Dispense Refill   acetaminophen (TYLENOL) 500 MG tablet Take 500-1,000 mg by mouth every 6 (six) hours as needed (pain.).     albuterol (VENTOLIN HFA) 108 (90 Base) MCG/ACT inhaler TAKE 2 PUFFS BY MOUTH EVERY 6 HOURS AS NEEDED FOR WHEEZE OR SHORTNESS OF BREATH 6.7 each 0   ALPRAZolam (XANAX) 0.25 MG tablet TAKE 1 TABLET (0.25 MG TOTAL) BY MOUTH AT BEDTIME AS NEEDED FOR ANXIETY OR SLEEP. 30 tablet 1   amLODipine (NORVASC) 10 MG tablet TAKE 1 TABLET BY MOUTH EVERY DAY 90 tablet 1   BREO ELLIPTA 200-25 MCG/ACT AEPB TAKE 1 PUFF BY MOUTH EVERY DAY 60 each 11   fluticasone (FLONASE) 50  MCG/ACT nasal spray PLACE 1 SPRAY INTO BOTH NOSTRILS 2 (TWO) TIMES DAILY (Patient taking differently: Place 1-2 sprays into both nostrils daily as needed for allergies or rhinitis.) 16 mL 2   losartan (COZAAR) 50 MG tablet Take 100 mg by mouth daily.     omeprazole (PRILOSEC) 40 MG capsule Take 1 capsule (40 mg total) by mouth daily. 60 capsule 0   rosuvastatin  (CRESTOR) 20 MG tablet Take 1 tablet (20 mg total) by mouth daily. (Patient taking differently: Take 20 mg by mouth every evening.) 90 tablet 3   traMADol (ULTRAM) 50 MG tablet Take 1 tablet (50 mg total) by mouth every 6 (six) hours as needed for moderate pain or severe pain. 25 tablet 0   TRYPTOPHAN PO Take 1 tablet by mouth at bedtime. Tryptophan with Magnesium + Vitamin B6     No current facility-administered medications on file prior to visit.   Past Medical History:  Diagnosis Date   Allergic rhinitis    Anxiety and depression    Asthma    Constipation, chronic    Coronary artery calcification seen on CAT scan 06/2010   Mild nonobstructive CAD on cardiac CT  -as of 2021, coronary calcium score 34   Dyspnea    Hemorrhoids    Hip pain, bilateral    Hyperlipidemia    Hypertension    Leukoplakia of vagina    Low back pain    w/ left side radiculopathy, s/p injection at L4-L5 [place] 1/06   Uterine fibroid    Allergies  Allergen Reactions   No [employer] Food Allergies Other (See Comments)    Cheese-elevated blood pressure   Social History   Socioeconomic History   Marital status: Divorced    Spouse name: Not on file   Number of children: 3   Years of education: Not on file   Highest education level: Not on file  Occupational History   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   Social Determinants of Health   Financial Resource Strain: Low Risk  (09/14/2020)    Overall Financial Resource Strain (CARDIA)    Difficulty of Paying Living Expenses: Not hard at all  Food Insecurity: No Food Insecurity (09/14/2020)   Hunger Vital Sign    Worried About Running Out of Food in the Last Year: Never true    Ran Out of Food in the Last Year: Never true  Transportation Needs: No Transportation Needs (09/14/2020)   PRAPARE - Hydrologist (Medical): No    Lack of Transportation (Non-Medical): No  Physical Activity: Sufficiently Active (09/14/2020)   Exercise Vital Sign    Days of Exercise per Week: 5 days    Minutes of Exercise per Session: 30 min  Stress: No Stress Concern Present (09/14/2020)   [place]    Feeling of Stress : Not  at all  Social Connections: Moderately Integrated (09/14/2020)   Social Connection and Isolation Panel [NHANES]    Frequency of Communication with Friends and Family: More than three times a week    Frequency of Social Gatherings with Friends and Family: More than three times a week    Attends Religious Services: 1 to 4 times per year    Active Member of [employer] or Organizations: Yes    Attends Archivist Meetings: 1 to 4 times per year    Marital Status: Divorced   Vitals:   09/08/21 0859  BP: 122/70  Pulse: 79  Resp: 12  Temp: 98.3 F (36.8 C)  SpO2: 99%   Body mass index is 33.3 kg/m.  Physical Exam Vitals and nursing note reviewed.  Constitutional:      General: She is not in acute distress.    Appearance: She is well-developed.  HENT:     Head: Normocephalic and atraumatic.     Mouth/Throat:     Mouth: Mucous membranes are moist.     Pharynx: Oropharynx is clear.  Eyes:     Conjunctiva/sclera: Conjunctivae normal.  Cardiovascular:     Rate and Rhythm: Normal rate and regular rhythm.     Heart sounds: No murmur heard. Pulmonary:     Effort: Pulmonary effort is normal. No respiratory distress.     Breath  sounds: Normal breath sounds.  Abdominal:     Palpations: Abdomen is soft. There is no mass.     Tenderness: There is abdominal tenderness in the epigastric area.  Lymphadenopathy:     Cervical: No cervical adenopathy.  Skin:    General: Skin is warm.     Findings: No erythema or rash.  Neurological:     General: No focal deficit present.     Mental Status: She is alert and oriented to person, place, and time.     Cranial Nerves: No cranial nerve deficit.     Gait: Gait normal.  Psychiatric:        Mood and Affect: Mood is anxious.     Comments: Well groomed, good eye contact.   ASSESSMENT AND PLAN:  Veronica [name] was seen today for abdominal pain.  Diagnoses and all orders for this visit: Orders Placed This Encounter  Procedures   TSH   Ambulatory referral to Gastroenterology   Ambulatory referral to ENT   Lab Results  Component Value Date   TSH 0.66 09/08/2021   Abdominal pain, epigastric We discussed possible etiologies. ? Dyspepsia,gastritis. Continue Omeprazole 40 mg daily. Sucralfate added today. GI appt will be arranged. Instructed about warning signs.  -     sucralfate (CARAFATE) 1 g tablet; Take 1 tablet (1 g total) by mouth 3 (three) times daily with meals.  Gastroesophageal reflux disease, unspecified whether esophagitis present Omeprazole 40 mg has helped with symptoms, no changes for now. Added today Sucralfate before meals. GERD precautions recommended.  -     sucralfate (CARAFATE) 1 g tablet; Take 1 tablet (1 g total) by mouth 3 (three) times daily with meals.  Chills (without fever) No other associated symptoms. Monitor for charges. She would like to have TSH done today.  Dysphonia Recurrent for years, mild. According to pt, her immunologist recommended ENT evaluation.  Generalized anxiety disorder She is not interested in adding SSRI or SNRI. Continue Alprazolam 0.25 mg daily as needed.  Colon cancer screening -     Ambulatory referral to  Gastroenterology  Return if symptoms worsen or fail to improve,  for Keep next appt.  Ector Laurel G. Martin[place], MD  [facility]. [place] office.

## 2021-09-08 NOTE — Patient Instructions (Signed)
A few things to remember from today's visit:   Abdominal pain, epigastric - Plan: Ambulatory referral to Gastroenterology, sucralfate (CARAFATE) 1 g tablet  Colon cancer screening - Plan: Ambulatory referral to Gastroenterology  Gastroesophageal reflux disease, unspecified whether esophagitis present - Plan: sucralfate (CARAFATE) 1 g tablet  Chills (without fever) - Plan: TSH  Dysphonia - Plan: Ambulatory referral to ENT  If you need refills please call your pharmacy. Do not use My Chart to request refills or for acute issues that need immediate attention.   Today we added sucralfate to take before meals. No changes in Omeprazole. Gastroenterology and throat specialist referrals placed.  Smaller portions. Wt loss will also help.  Continue Alprazolam.  Please be sure medication list is accurate. If a new problem present, please set up appointment sooner than planned today.

## 2021-09-12 DIAGNOSIS — R052 Subacute cough: Secondary | ICD-10-CM | POA: Diagnosis not present

## 2021-09-12 DIAGNOSIS — J300 Vasomotor rhinitis: Secondary | ICD-10-CM | POA: Diagnosis not present

## 2021-09-12 DIAGNOSIS — J3089 Other allergic rhinitis: Secondary | ICD-10-CM | POA: Diagnosis not present

## 2021-09-12 DIAGNOSIS — J301 Allergic rhinitis due to pollen: Secondary | ICD-10-CM | POA: Diagnosis not present

## 2021-09-14 ENCOUNTER — Other Ambulatory Visit: Payer: Self-pay | Admitting: Family Medicine

## 2021-09-18 ENCOUNTER — Ambulatory Visit (INDEPENDENT_AMBULATORY_CARE_PROVIDER_SITE_OTHER): Payer: Medicare PPO

## 2021-09-18 VITALS — Ht 65.0 in | Wt 200.0 lb

## 2021-09-18 DIAGNOSIS — Z1231 Encounter for screening mammogram for malignant neoplasm of breast: Secondary | ICD-10-CM | POA: Diagnosis not present

## 2021-09-18 DIAGNOSIS — Z0000 Encounter for general adult medical examination without abnormal findings: Secondary | ICD-10-CM | POA: Diagnosis not present

## 2021-09-18 NOTE — Patient Instructions (Addendum)
Ms. Veronica [name] , Thank you for taking time to come for your Medicare Wellness Visit. I appreciate your ongoing commitment to your health goals. Please review the following plan we discussed and let me know if I can assist you in the future.   These are the goals we discussed:  Goals       No current goals (pt-stated)      Patient Stated      Lose weight         This is a list of the screening recommended for you and due dates:  Health Maintenance  Topic Date Due   COVID-19 Vaccine (5 - Pfizer risk series) 09/24/2021*   Flu Shot  10/06/2021*   Zoster (Shingles) Vaccine (1 of 2) 02/02/2022*   Mammogram  09/19/2022*   Colon Cancer Screening  02/03/2024   Tetanus Vaccine  10/23/2024   Pneumonia Vaccine  Completed   DEXA scan (bone density measurement)  Completed   Hepatitis C Screening: USPSTF Recommendation to screen - Ages 70-79 yo.  Completed   HPV Vaccine  Aged Out  *Topic was postponed. The date shown is not the original due date.   Advanced directives: Yes  Conditions/risks identified: None  Next appointment: Follow up in one year for your annual wellness visit     Preventive Care 65 Years and Older, Female Preventive care refers to lifestyle choices and visits with your health care provider that can promote health and wellness. What does preventive care include? A yearly physical exam. This is also called an annual well check. Dental exams once or twice a year. Routine eye exams. Ask your health care provider how often you should have your eyes checked. Personal lifestyle choices, including: Daily care of your teeth and gums. Regular physical activity. Eating a healthy diet. Avoiding tobacco and drug use. Limiting alcohol use. Practicing safe sex. Taking low-dose aspirin every day. Taking vitamin and mineral supplements as recommended by your health care provider. What happens during an annual well check? The services and screenings done by your health care  provider during your annual well check will depend on your age, overall health, lifestyle risk factors, and family history of disease. Counseling  Your health care provider may ask you questions about your: Alcohol use. Tobacco use. Drug use. Emotional well-being. Home and relationship well-being. Sexual activity. Eating habits. History of falls. Memory and ability to understand (cognition). Work and work Statistician. Reproductive health. Screening  You may have the following tests or measurements: Height, weight, and BMI. Blood pressure. Lipid and cholesterol levels. These may be checked every 5 years, or more frequently if you are over 59 years old. Skin check. Lung cancer screening. You may have this screening every year starting at age 69 if you have a 30-pack-year history of smoking and currently smoke or have quit within the past 15 years. Fecal occult blood test (FOBT) of the stool. You may have this test every year starting at age 63. Flexible sigmoidoscopy or colonoscopy. You may have a sigmoidoscopy every 5 years or a colonoscopy every 10 years starting at age 15. Hepatitis C blood test. Hepatitis B blood test. Sexually transmitted disease (STD) testing. Diabetes screening. This is done by checking your blood sugar (glucose) after you have not eaten for a while (fasting). You may have this done every 1-3 years. Bone density scan. This is done to screen for osteoporosis. You may have this done starting at age 38. Mammogram. This may be done every 1-2 years. Talk to  your health care provider about how often you should have regular mammograms. Talk with your health care provider about your test results, treatment options, and if necessary, the need for more tests. Vaccines  Your health care provider may recommend certain vaccines, such as: Influenza vaccine. This is recommended every year. Tetanus, diphtheria, and acellular pertussis (Tdap, Td) vaccine. You may need a Td  booster every 10 years. Zoster vaccine. You may need this after age 25. Pneumococcal 13-valent conjugate (PCV13) vaccine. One dose is recommended after age 84. Pneumococcal polysaccharide (PPSV23) vaccine. One dose is recommended after age 42. Talk to your health care provider about which screenings and vaccines you need and how often you need them. This information is not intended to replace advice given to you by your health care provider. Make sure you discuss any questions you have with your health care provider. Document Released: 02/11/2015 Document Revised: 10/05/2015 Document Reviewed: 11/16/2014 Elsevier Interactive Patient Education  2017 [place] Prevention in the Home Falls can cause injuries. They can happen to people of all ages. There are many things you can do to make your home safe and to help prevent falls. What can I do on the outside of my home? Regularly fix the edges of walkways and driveways and fix any cracks. Remove anything that might make you trip as you walk through a door, such as a raised step or threshold. Trim any bushes or trees on the path to your home. Use bright outdoor lighting. Clear any walking paths of anything that might make someone trip, such as rocks or tools. Regularly check to see if handrails are loose or broken. Make sure that both sides of any steps have handrails. Any raised decks and porches should have guardrails on the edges. Have any leaves, snow, or ice cleared regularly. Use sand or salt on walking paths during winter. Clean up any spills in your garage right away. This includes oil or grease spills. What can I do in the bathroom? Use night lights. Install grab bars by the toilet and in the tub and shower. Do not use towel bars as grab bars. Use non-skid mats or decals in the tub or shower. If you need to sit down in the shower, use a plastic, non-slip stool. Keep the floor dry. Clean up any water that spills on the floor  as soon as it happens. Remove soap buildup in the tub or shower regularly. Attach bath mats securely with double-sided non-slip rug tape. Do not have throw rugs and other things on the floor that can make you trip. What can I do in the bedroom? Use night lights. Make sure that you have a light by your bed that is easy to reach. Do not use any sheets or blankets that are too big for your bed. They should not hang down onto the floor. Have a firm chair that has side arms. You can use this for support while you get dressed. Do not have throw rugs and other things on the floor that can make you trip. What can I do in the kitchen? Clean up any spills right away. Avoid walking on wet floors. Keep items that you use a lot in easy-to-reach places. If you need to reach something above you, use a strong step stool that has a grab bar. Keep electrical cords out of the way. Do not use floor polish or wax that makes floors slippery. If you must use wax, use non-skid floor wax. Do not  have throw rugs and other things on the floor that can make you trip. What can I do with my stairs? Do not leave any items on the stairs. Make sure that there are handrails on both sides of the stairs and use them. Fix handrails that are broken or loose. Make sure that handrails are as long as the stairways. Check any carpeting to make sure that it is firmly attached to the stairs. Fix any carpet that is loose or worn. Avoid having throw rugs at the top or bottom of the stairs. If you do have throw rugs, attach them to the floor with carpet tape. Make sure that you have a light switch at the top of the stairs and the bottom of the stairs. If you do not have them, ask someone to add them for you. What else can I do to help prevent falls? Wear shoes that: Do not have high heels. Have rubber bottoms. Are comfortable and fit you well. Are closed at the toe. Do not wear sandals. If you use a stepladder: Make sure that it is  fully opened. Do not climb a closed stepladder. Make sure that both sides of the stepladder are locked into place. Ask someone to hold it for you, if possible. Clearly mark and make sure that you can see: Any grab bars or handrails. First and last steps. Where the edge of each step is. Use tools that help you move around (mobility aids) if they are needed. These include: Canes. Walkers. Scooters. Crutches. Turn on the lights when you go into a dark area. Replace any light bulbs as soon as they burn out. Set up your furniture so you have a clear path. Avoid moving your furniture around. If any of your floors are uneven, fix them. If there are any pets around you, be aware of where they are. Review your medicines with your doctor. Some medicines can make you feel dizzy. This can increase your chance of falling. Ask your doctor what other things that you can do to help prevent falls. This information is not intended to replace advice given to you by your health care provider. Make sure you discuss any questions you have with your health care provider. Document Released: 11/11/2008 Document Revised: 06/23/2015 Document Reviewed: 02/19/2014 Elsevier Interactive Patient Education  2017 [employer].

## 2021-09-18 NOTE — Progress Notes (Signed)
Subjective:   Veronica [name] is a 71 y.o. female who presents for Medicare Annual (Subsequent) preventive examination.  Review of Systems    Virtual Visit via Telephone Note  I connected with  LINDYN VOSSLER on 09/18/21 at  2:30 PM EDT by telephone and verified that I am speaking with the correct person using two identifiers.  Location: Patient: Home Provider: Office Persons participating in the virtual visit: patient/Nurse Health Advisor   I discussed the limitations, risks, security and privacy concerns of performing an evaluation and management service by telephone and the availability of in person appointments. The patient expressed understanding and agreed to proceed.  Interactive audio and video telecommunications were attempted between this nurse and patient, however failed, due to patient having technical difficulties OR patient did not have access to video capability.  We continued and completed visit with audio only.  Some vital signs may be absent or patient reported.   Tillie Rung, LPN  Cardiac Risk Factors include: advanced age (>78men, >74 women);hypertension     Objective:    Today's Vitals   09/18/21 1445  Weight: 200 lb (90.7 kg)  Height: 5\' 5"  (1.651 m)   Body mass index is 33.28 kg/m.     09/18/2021    2:52 PM 08/04/2021    9:29 AM 02/05/2021    7:21 PM 09/14/2020   10:18 AM 10/26/2019    9:02 PM 04/01/2018    2:19 PM 02/02/2014   11:32 AM  Advanced Directives  Does Patient Have a Medical Advance Directive? Yes Yes No No No No No  Type of 04/03/2014 of [place];Living will Living will;Healthcare Power of Attorney       Does patient want to make changes to medical advance directive? No - Patient declined        Copy of Healthcare Power of Attorney in Chart? No - copy requested        Would patient like information on creating a medical advance directive?    Yes (MAU/Ambulatory/Procedural Areas - Information given)  No - Patient  declined No - patient declined information    Current Medications (verified) Outpatient Encounter Medications as of 09/18/2021  Medication Sig   acetaminophen (TYLENOL) 500 MG tablet Take 500-1,000 mg by mouth every 6 (six) hours as needed (pain.).   albuterol (VENTOLIN HFA) 108 (90 Base) MCG/ACT inhaler TAKE 2 PUFFS BY MOUTH EVERY 6 HOURS AS NEEDED FOR WHEEZE OR SHORTNESS OF BREATH   ALPRAZolam (XANAX) 0.25 MG tablet TAKE 1 TABLET (0.25 MG TOTAL) BY MOUTH AT BEDTIME AS NEEDED FOR ANXIETY OR SLEEP.   amLODipine (NORVASC) 10 MG tablet TAKE 1 TABLET BY MOUTH EVERY DAY   BREO ELLIPTA 200-25 MCG/ACT AEPB TAKE 1 PUFF BY MOUTH EVERY DAY   fluticasone (FLONASE) 50 MCG/ACT nasal spray PLACE 1 SPRAY INTO BOTH NOSTRILS 2 (TWO) TIMES DAILY (Patient taking differently: Place 1-2 sprays into both nostrils daily as needed for allergies or rhinitis.)   losartan (COZAAR) 50 MG tablet Take 100 mg by mouth daily.   omeprazole (PRILOSEC) 40 MG capsule Take 1 capsule (40 mg total) by mouth daily.   rosuvastatin (CRESTOR) 20 MG tablet Take 1 tablet (20 mg total) by mouth daily. (Patient taking differently: Take 20 mg by mouth every evening.)   sucralfate (CARAFATE) 1 g tablet Take 1 tablet (1 g total) by mouth 3 (three) times daily with meals.   traMADol (ULTRAM) 50 MG tablet Take 1 tablet (50 mg total) by mouth  every 6 (six) hours as needed for moderate pain or severe pain.   TRYPTOPHAN PO Take 1 tablet by mouth at bedtime. Tryptophan with Magnesium + Vitamin B6   No facility-administered encounter medications on file as of 09/18/2021.    Allergies (verified) No healthtouch food allergies   History: Past Medical History:  Diagnosis Date   Allergic rhinitis    Anxiety and depression    Asthma    Constipation, chronic    Coronary artery calcification seen on CAT scan 06/2010   Mild nonobstructive CAD on cardiac CT  -as of 2021, coronary calcium score 34   Dyspnea    Hemorrhoids    Hip pain, bilateral     Hyperlipidemia    Hypertension    Leukoplakia of vagina    Low back pain    w/ left side radiculopathy, s/p injection at L4-L5 forarmen 1/06   Uterine fibroid    Past Surgical History:  Procedure Laterality Date   ABDOMINAL HYSTERECTOMY  11-2000   no oophorectomy per pt   CHOLECYSTECTOMY  12/08/2010   Procedure: LAPAROSCOPIC CHOLECYSTECTOMY WITH INTRAOPERATIVE CHOLANGIOGRAM;  Surgeon: Pedro Earls, MD;  Location: [facility];  Service: General;  Laterality: N/A;  c-arm    COLONOSCOPY WITH PROPOFOL N/A 02/02/2014   Procedure: COLONOSCOPY WITH PROPOFOL;  Surgeon: Garlan Fair, MD;  Location: [facility];  Service: Endoscopy;  Laterality: N/A;   CORONARY CALCIUM SCORE  12/2018   Coronary calcium score-34   CORONARY CT ANGIOGRAM  06/2010    Coronary calcium score 12.  Mild nonobstructive disease.  Ostial LAD and first D1/RI.  Right dominant.  Focal scarring and bronchiectasis of medial right lower lobe.   CYST EXCISION Left 08/08/2021   Procedure: EXCISION LEFT UPPER BACK SEBACEOUS CYST X2;  Surgeon: Coralie Keens, MD;  Location: [facility];  Service: General;  Laterality: Left;   LEEP     unknown date    NM MYOVIEW LTD  10/2010   Nonischemic.  Normal EF.   TUBAL LIGATION     bilateral 1989   UTERINE FIBROID SURGERY     Family History  Problem Relation Age of Onset   Schizophrenia Son    Schizophrenia Brother    CVA Brother    Valvular heart disease Brother        Valve Surgery   Schizophrenia Other        2 nieces   Hypertension Mother    Hyperlipidemia Mother    Heart attack Mother 89   Heart disease Mother    Anxiety disorder Mother    Hypertension Father    Hyperlipidemia Father    AAA (abdominal aortic aneurysm) Father 12   Heart disease Father    Heart disease Brother        Vavle surgery   Diabetes Neg Hx    Colon cancer Neg Hx    Breast cancer Neg Hx    Social History   Socioeconomic History   Marital status: Divorced    Spouse name: Not on file   Number  of children: 3   Years of education: Not on file   Highest education level: Not on file  Occupational History   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   Social Determinants of Health   Financial Resource Strain: Low Risk  (09/18/2021)   Overall Financial Resource Strain (CARDIA)    Difficulty of Paying Living Expenses: Not hard at all  Food Insecurity: No Food Insecurity (09/18/2021)   Hunger Vital Sign    Worried About Running Out of Food in the Last Year: Never true    Ran Out of Food in the Last Year: Never true  Transportation Needs: No Transportation Needs (09/18/2021)   PRAPARE - Hydrologist (Medical): No    Lack of Transportation (Non-Medical): No  Physical Activity: Sufficiently Active (09/18/2021)   Exercise Vital Sign    Days of Exercise per Week: 5 days    Minutes of Exercise per Session: 50 min  Stress: No Stress Concern Present (09/18/2021)   [place]    Feeling of Stress : Not at all  Social Connections: Moderately Integrated (09/18/2021)   Social Connection and Isolation Panel [NHANES]    Frequency of Communication with Friends and Family: More than three times a week    Frequency of Social Gatherings with Friends and Family: More than three times a week    Attends Religious Services: More than 4 times per year    Active Member of [employer] or Organizations: Yes    Attends  Music therapist: More than 4 times per year    Marital Status: Divorced    Tobacco Counseling Counseling given: Not Answered   Clinical Intake:  Pre-visit preparation completed: No  Pain : No/denies pain     BMI - recorded: 33.3 Nutritional Status: BMI > 30  Obese Nutritional Risks: None Diabetes: No  How often do you need to have someone help you when you read instructions, pamphlets, or other written materials from your doctor or pharmacy?: 1 - Never  Diabetic?  No  Interpreter Needed?: No  Information entered by :: Rolene Arbour LPN   Activities of Daily Living    09/18/2021    2:50 PM 08/04/2021    9:34 AM  In your present state of health, do you have any difficulty performing the following activities:  Hearing? 0   Vision? 0   Difficulty concentrating or making decisions? 0   Walking or climbing stairs? 0   Dressing or bathing? 0   Doing errands, shopping? 0 0  Preparing Food and eating ? N   Using the Toilet? N   In the past six months, have you accidently leaked urine? N   Do you have problems with loss of bowel control? N   Managing your Medications? N   Managing your Finances? N   Housekeeping or managing your Housekeeping? N     Patient Care Team: [place], Betty G, MD as PCP - General (Family Medicine) Leonie Man, MD as PCP - Cardiology (Cardiology)  Indicate any recent Medical Services you may have received from other than [facility] providers in the past year (date may be approximate).     Assessment:   This is a routine wellness examination for [place].  Hearing/Vision screen Hearing Screening - Comments:: No hearing difficulty Vision Screening -  Comments:: Wears glasses  Dietary issues and exercise activities discussed: Exercise limited by: None identified   Goals Addressed               This Visit's Progress     No current goals (pt-stated)         Depression Screen    09/18/2021    2:48 PM 06/30/2021    1:59 PM  02/07/2021   11:16 AM 09/14/2020   10:17 AM 09/22/2019    7:41 AM 08/30/2019    3:18 PM 01/06/2018    7:50 AM  PHQ 2/9 Scores  PHQ - 2 Score 0 0 4 0 2 0 0  PHQ- 9 Score   7  3      Fall Risk    09/18/2021    2:51 PM 06/30/2021    1:59 PM 02/07/2021   11:16 AM 11/21/2020    9:23 PM 09/14/2020   10:20 AM  [place] in the past year? 0 0 0 0 0  Number falls in past yr: 0 0  0 0  Injury with Fall? 0 0  0 0  Risk for fall due to : No Fall Risks No Fall Risks   Impaired vision  Follow up  Falls evaluation completed  Education provided Falls prevention discussed    FALL RISK PREVENTION PERTAINING TO THE HOME:  Any stairs in or around the home? Yes  If so, are there any without handrails? No  Home free of loose throw rugs in walkways, pet beds, electrical cords, etc? Yes  Adequate lighting in your home to reduce risk of falls? Yes   ASSISTIVE DEVICES UTILIZED TO PREVENT FALLS:  Life alert? No  Use of a cane, walker or w/c? No  Grab bars in the bathroom? No  Shower chair or bench in shower? No  Elevated toilet seat or a handicapped toilet? No   TIMED UP AND GO:  Was the test performed? No . Audio Visit   Cognitive Function:        09/18/2021    2:52 PM 09/14/2020   10:21 AM  6CIT Screen  What Year? 0 points 0 points  What month? 0 points 0 points  What time? 0 points 0 points  Count back from 20 0 points 0 points  Months in reverse 0 points 0 points  Repeat phrase 0 points 0 points  Total Score 0 points 0 points    Immunizations Immunization History  Administered Date(s) Administered   Hepatitis A, Adult 10/26/2015   Influenza Whole 10/07/2009   MMR 10/26/2015   PFIZER Comirnaty(Gray Top)Covid-19 Tri-Sucrose Vaccine 02/10/2020   PFIZER(Purple Top)SARS-COV-2 Vaccination 04/10/2019, 05/05/2019, 08/30/2019   Pneumococcal Conjugate-13 01/06/2018   Pneumococcal Polysaccharide-23 08/28/2019   Tdap 10/24/2014   Zoster, Live 09/01/2012    TDAP status: Up to  date    Pneumococcal vaccine status: Up to date  Covid-19 vaccine status: Completed vaccines  Qualifies for Shingles Vaccine? Yes   Zostavax completed No   Shingrix Completed?: No.    Education has been provided regarding the importance of this vaccine. Patient has been advised to call insurance company to determine out of pocket expense if they have not yet received this vaccine. Advised may also receive vaccine at local pharmacy or Health Dept. Verbalized acceptance and understanding.  Screening Tests Health Maintenance  Topic Date Due   COVID-19 Vaccine (5 - Pfizer risk series) 09/24/2021 (Originally 04/06/2020)   INFLUENZA VACCINE  10/06/2021 (Originally 08/29/2021)   Zoster  Vaccines- Shingrix (1 of 2) 02/02/2022 (Originally 02/22/1969)   MAMMOGRAM  09/19/2022 (Originally 08/30/2021)   COLONOSCOPY (Pts 45-33yrs Insurance coverage will need to be confirmed)  02/03/2024   TETANUS/TDAP  10/23/2024   Pneumonia Vaccine 35+ Years old  Completed   DEXA SCAN  Completed   Hepatitis C Screening  Completed   HPV VACCINES  Aged Out    Health Maintenance  There are no preventive care reminders to display for this patient.   Colorectal cancer screening: Type of screening: Colonoscopy. Completed 02/02/14. Repeat every 10 years  Mammogram status: Ordered 09/18/21. Pt provided with contact info and advised to call to schedule appt.   Bone Density status: Completed 03/06/18. Results reflect: Bone density results: OSTEOPOROSIS. Repeat every   years.  Lung Cancer Screening: (Low Dose CT Chest recommended if Age 29-80 years, 30 pack-year currently smoking OR have quit w/in 15years.) does not qualify.     Additional Screening:  Hepatitis C Screening: does qualify; Completed 12/26/16  Vision Screening: Recommended annual ophthalmology exams for early detection of glaucoma and other disorders of the eye. Is the patient up to date with their annual eye exam?  Yes  Who is the provider or what is the  name of the office in which the patient attends annual eye exams? Dr Idolina Primer If pt is not established with a provider, would they like to be referred to a provider to establish care? No .   Dental Screening: Recommended annual dental exams for proper oral hygiene  Community Resource Referral / Chronic Care Management:  CRR required this visit?  No   CCM required this visit?  No      Plan:     I have personally reviewed and noted the following in the patient's chart:   Medical and social history Use of alcohol, tobacco or illicit drugs  Current medications and supplements including opioid prescriptions. Patient is not currently taking opioid prescriptions. Functional ability and status Nutritional status Physical activity Advanced directives List of other physicians Hospitalizations, surgeries, and ER visits in previous 12 months Vitals Screenings to include cognitive, depression, and falls Referrals and appointments  In addition, I have reviewed and discussed with patient certain preventive protocols, quality metrics, and best practice recommendations. A written personalized care plan for preventive services as well as general preventive health recommendations were provided to patient.     Criselda Peaches, LPN   6/83/[id number]   Nurse Notes: None

## 2021-09-26 ENCOUNTER — Encounter: Payer: Self-pay | Admitting: Internal Medicine

## 2021-10-03 ENCOUNTER — Telehealth: Payer: Medicare PPO | Admitting: Family Medicine

## 2021-10-04 ENCOUNTER — Ambulatory Visit: Payer: Medicare PPO

## 2021-10-06 ENCOUNTER — Encounter: Payer: Self-pay | Admitting: Internal Medicine

## 2021-10-06 ENCOUNTER — Other Ambulatory Visit: Payer: Self-pay | Admitting: Family Medicine

## 2021-10-06 ENCOUNTER — Ambulatory Visit (INDEPENDENT_AMBULATORY_CARE_PROVIDER_SITE_OTHER): Payer: Medicare Other | Admitting: Internal Medicine

## 2021-10-06 VITALS — BP 152/92 | HR 65 | Ht 65.0 in | Wt 202.0 lb

## 2021-10-06 DIAGNOSIS — R1310 Dysphagia, unspecified: Secondary | ICD-10-CM

## 2021-10-06 DIAGNOSIS — Z86010 Personal history of colonic polyps: Secondary | ICD-10-CM

## 2021-10-06 DIAGNOSIS — R1013 Epigastric pain: Secondary | ICD-10-CM

## 2021-10-06 DIAGNOSIS — F411 Generalized anxiety disorder: Secondary | ICD-10-CM

## 2021-10-06 DIAGNOSIS — G8929 Other chronic pain: Secondary | ICD-10-CM

## 2021-10-06 DIAGNOSIS — K219 Gastro-esophageal reflux disease without esophagitis: Secondary | ICD-10-CM | POA: Diagnosis not present

## 2021-10-06 NOTE — Patient Instructions (Addendum)
_______________________________________________________  If you are age 69 or older, your body mass index should be between 23-30. Your Body mass index is 33.61 kg/m. If this is out of the aforementioned range listed, please consider follow up with your Primary Care Provider.  If you are age 28 or younger, your body mass index should be between 19-25. Your Body mass index is 33.61 kg/m. If this is out of the aformentioned range listed, please consider follow up with your Primary Care Provider.   ________________________________________________________  The [facility] providers would like to encourage you to use [facility] to communicate with providers for non-urgent requests or questions.  Due to long hold times on the telephone, sending your provider a message by [facility] may be a faster and more efficient way to get a response.  Please allow 48 business hours for a response.  Please remember that this is for non-urgent requests.  _______________________________________________________  Due to recent changes in healthcare laws, you may see the results of your imaging and laboratory studies on MyChart before your provider has had a chance to review them.  We understand that in some cases there may be results that are confusing or concerning to you. Not all laboratory results come back in the same time frame and the provider may be waiting for multiple results in order to interpret others.  Please give [place] 48 hours in order for your provider to thoroughly review all the results before contacting the office for clarification of your results.   You have been scheduled for an endoscopy. Please follow written instructions given to you at your visit today. If you use inhalers (even only as needed), please bring them with you on the day of your procedure.   Thank you for choosing me and [facility].  Scarlette Shorts, MD.

## 2021-10-06 NOTE — Progress Notes (Signed)
HISTORY OF PRESENT ILLNESS:  Veronica [name] is a 71 y.o. female, native of [place], who presents today at the request of her primary care provider regarding problems with epigastric pain, substernal discomfort, and dysphagia.  Patient states that she has had symptoms for greater than 1 year.  At one point she was placed on omeprazole 40 mg daily.  This is helped a lot.  She tells me that she has had problems with shortness of breath and anxiety with panic attacks.  She been evaluated by cardiology, pulmonology, and allergist.  She also tells me that she has tenderness in 1 area of the epigastric region which she wants me to examine.  This is her first visit to our clinic.  She has had GI care elsewhere including colonoscopy in 2005 with Dr. Timmothy Euler and colonoscopy more recently in 2016 with Dr. Wynetta Emery.  Found to have a diminutive adenoma at that time.  She has no lower GI complaints.  She is accompanied today by her friend from church who serves as a Geophysicist/field seismologist but does not speak [place].  Review of blood work from July 2023 shows normal hemoglobin 13.1.  REVIEW OF SYSTEMS:  All non-GI ROS negative unless otherwise stated in the HPI except for allergies, anxiety, back pain, visual change, fatigue, headache, fever, hearing problems, itching problem muscle cramps, shortness of breath, sleeping problems, ankle swelling, excessive thirst, voice change  Past Medical History:  Diagnosis Date   Allergic rhinitis    Anxiety and depression    Asthma    Constipation, chronic    COPD (chronic obstructive pulmonary disease) ([place])    Coronary artery calcification seen on CAT scan 06/2010   Mild nonobstructive CAD on cardiac CT  -as of 2021, coronary calcium score 34   Dyspnea    Hemorrhoids    Hip pain, bilateral    Hyperlipidemia    Hypertension    Leukoplakia of vagina    Low back pain    w/ left side radiculopathy, s/p injection at L4-L5 for[place]en 1/06   Uterine fibroid     Past Surgical History:   Procedure Laterality Date   ABDOMINAL HYSTERECTOMY  11-2000   no oophorectomy per pt   CHOLECYSTECTOMY  12/08/2010   Procedure: LAPAROSCOPIC CHOLECYSTECTOMY WITH INTRAOPERATIVE CHOLANGIOGRAM;  Surgeon: Pedro Earls, MD;  [facility];  Service: General;  Laterality: N/A;  c-arm    COLONOSCOPY WITH PROPOFOL N/A 02/02/2014   Procedure: COLONOSCOPY WITH PROPOFOL;  Surgeon: Garlan Fair, MD;  [facility];  Service: Endoscopy;  Laterality: N/A;   CORONARY CALCIUM SCORE  12/2018   Coronary calcium score-34   CORONARY CT ANGIOGRAM  06/2010    Coronary calcium score 12.  Mild nonobstructive disease.  Ostial LAD and first D1/RI.  Right dominant.  Focal scarring and bronchiectasis of medial right lower lobe.   CYST EXCISION Left 08/08/2021   Procedure: EXCISION LEFT UPPER BACK SEBACEOUS CYST X2;  Surgeon: Coralie Keens, MD;  [facility];  Service: General;  Laterality: Left;   LEEP     unknown date    NM MYOVIEW LTD  10/2010   Nonischemic.  Normal EF.   TUBAL LIGATION     bilateral [place]  reports that she has never smoked. She has never used smokeless tobacco. She reports that she does not drink alcohol and does not use drugs.  family history includes AAA (abdominal aortic aneurysm) (age  of onset: 19) in her father; Anxiety disorder in her mother; CVA in her brother; Heart attack (age of onset: 96) in her mother; Heart disease in her brother, father, and mother; Hyperlipidemia in her father and mother; Hypertension in her father and mother; Schizophrenia in her brother, son, and another family member; Valvular heart disease in her brother.  Allergies  Allergen Reactions   No Healthtouch Food Allergies Other (See Comments)    Cheese-elevated blood pressure       PHYSICAL EXAMINATION: Vital signs: BP (!) 152/92   Pulse 65   Ht _0  (1.651 m)   Wt 202 lb (91.6 kg)   BMI 33.61 kg/m   Constitutional:  generally well-appearing, no acute distress Psychiatric: alert and oriented x3, cooperative Eyes: extraocular movements intact, anicteric, conjunctiva pink Mouth: oral pharynx moist, no lesions Neck: supple no lymphadenopathy Chest: Tenderness to touch over the xiphoid process.  This reproduces discomfort that she has had Cardiovascular: heart regular rate and rhythm, no murmur Lungs: clear to auscultation bilaterally Abdomen: soft, nontender, nondistended, no obvious ascites, no peritoneal signs, normal bowel sounds, no organomegaly Rectal: Omitted Extremities: no clubbing cyanosis, or lower extremity edema bilaterally Skin: no lesions on visible extremities Neuro: No focal deficits.  Cranial nerves intact  ASSESSMENT:  1.  Chronic GERD 2.  Intermittent dysphagia 3.  Tenderness of the xiphoid process.  Nonpathologic 4.  Anxiety, including health related anxiety 5.  History of diminutive adenoma 2016  PLAN:  1.  Reflux precautions 2.  Continue omeprazole 40 mg daily 3.  She is not taking prescribed Carafate.  Told not to. 4.  Schedule upper endoscopy with possible esophageal dilation.The nature of the procedure, as well as the risks, benefits, and alternatives were carefully and thoroughly reviewed with the patient. Ample time for discussion and questions allowed. The patient understood, was satisfied, and agreed to proceed.  5.  Surveillance colonoscopy would be due around January 2026 6.  Return to PCP for a myriad of non-GI complaints. A total time of 60 minutes was spent preparing to see the patient, reviewing outside records, reviewing outside laboratories, endoscopy reports, and pathology report.  Obtaining comprehensive history, performing medically appropriate physical examination, counseling and educating the patient regarding the above listed issues, directing medical therapies, ordering advanced/therapeutic endoscopic procedure, and documenting clinical information in the  health record

## 2021-10-11 ENCOUNTER — Encounter: Payer: Self-pay | Admitting: Internal Medicine

## 2021-10-11 ENCOUNTER — Ambulatory Visit (AMBULATORY_SURGERY_CENTER): Payer: Medicare Other | Admitting: Internal Medicine

## 2021-10-11 VITALS — BP 127/71 | HR 58 | Temp 96.6°F | Resp 13 | Ht 65.0 in | Wt 195.0 lb

## 2021-10-11 DIAGNOSIS — R1013 Epigastric pain: Secondary | ICD-10-CM | POA: Diagnosis not present

## 2021-10-11 DIAGNOSIS — K219 Gastro-esophageal reflux disease without esophagitis: Secondary | ICD-10-CM

## 2021-10-11 DIAGNOSIS — R1310 Dysphagia, unspecified: Secondary | ICD-10-CM | POA: Diagnosis not present

## 2021-10-11 DIAGNOSIS — G8929 Other chronic pain: Secondary | ICD-10-CM

## 2021-10-11 MED ORDER — SODIUM CHLORIDE 0.9 % IV SOLN: 500.0000 mL | INTRAVENOUS | Status: DC

## 2021-10-11 NOTE — Progress Notes (Signed)
Called to room to assist during endoscopic procedure.  Patient ID and intended procedure confirmed with present staff. Received instructions for my participation in the procedure from the performing physician.  

## 2021-10-11 NOTE — Progress Notes (Signed)
To pacu, VSS. Report to [email or web address] 

## 2021-10-11 NOTE — Patient Instructions (Signed)
YOU HAD AN ENDOSCOPIC PROCEDURE TODAY AT [facility]:   Refer to the procedure report that was given to you for any specific questions about what was found during the examination.  If the procedure report does not answer your questions, please call your gastroenterologist to clarify.  If you requested that your care partner not be given the details of your procedure findings, then the procedure report has been included in a sealed envelope for you to review at your convenience later.  YOU SHOULD EXPECT: Some feelings of bloating in the abdomen. Passage of more gas than usual.  Walking can help get rid of the air that was put into your GI tract during the procedure and reduce the bloating. If you had a lower endoscopy (such as a colonoscopy or flexible sigmoidoscopy) you may notice spotting of blood in your stool or on the toilet paper. If you underwent a bowel prep for your procedure, you may not have a normal bowel movement for a few days.  Please Note:  You might notice some irritation and congestion in your nose or some drainage.  This is from the oxygen used during your procedure.  There is no need for concern and it should clear up in a day or so.  SYMPTOMS TO REPORT IMMEDIATELY:  Following upper endoscopy (EGD)  Vomiting of blood or coffee ground material  New chest pain or pain under the shoulder blades  Painful or persistently difficult swallowing  New shortness of breath  Fever of 100F or higher  Black, tarry-looking stools  For urgent or emergent issues, a gastroenterologist can be reached at any hour by calling [phone]. Do not use MyChart messaging for urgent concerns.    DIET:  We do recommend a small meal at first, but then you may proceed to your regular diet.  Drink plenty of fluids but you should avoid alcoholic beverages for 24 hours.  MEDICATIONS: Continue present medications.  Please see handouts given to you by your recovery nurse.  Thank you for  allowing [place] to provide for your healthcare needs today.  ACTIVITY:  You should plan to take it easy for the rest of today and you should NOT DRIVE or use heavy machinery until tomorrow (because of the sedation medicines used during the test).    FOLLOW UP: Our staff will call the number listed on your records the next business day following your procedure.  We will call around 7:15- 8:00 am to check on you and address any questions or concerns that you may have regarding the information given to you following your procedure. If we do not reach you, we will leave a message.     If any biopsies were taken you will be contacted by phone or by letter within the next 1-3 weeks.  Please call us at [phone] if you have not heard about the biopsies in 3 weeks.    SIGNATURES/CONFIDENTIALITY: You and/or your care partner have signed paperwork which will be entered into your electronic medical record.  These signatures attest to the fact that that the information above on your After Visit Summary has been reviewed and is understood.  Full responsibility of the confidentiality of this discharge information lies with you and/or your care-partner.

## 2021-10-11 NOTE — Op Note (Signed)
[facility] Patient Name: Veronica [name] Procedure Date: 10/11/2021 10:06 AM MRN: [id number] Endoscopist: Docia Chuck. Henrene Pastor , MD Age: 71 Referring MD:  Date of Birth: [birth year] Gender: Female Account #: [id number] Procedure:                Upper GI endoscopy Indications:              Dysphagia, Esophageal reflux Medicines:                Monitored Anesthesia Care Procedure:                Pre-Anesthesia Assessment:                           - Prior to the procedure, a History and Physical                            was performed, and patient medications and                            allergies were reviewed. The patient's tolerance of                            previous anesthesia was also reviewed. The risks                            and benefits of the procedure and the sedation                            options and risks were discussed with the patient.                            All questions were answered, and informed consent                            was obtained. Prior Anticoagulants: The patient has                            taken no previous anticoagulant or antiplatelet                            agents. ASA Grade Assessment: II - A patient with                            mild systemic disease. After reviewing the risks                            and benefits, the patient was deemed in                            satisfactory condition to undergo the procedure.                           After obtaining informed consent, the endoscope was  passed under direct vision. Throughout the                            procedure, the patient's blood pressure, pulse, and                            oxygen saturations were monitored continuously. The                            Endoscope was introduced through the mouth, and                            advanced to the second part of duodenum. The upper                            GI endoscopy was  accomplished without difficulty.                            The patient tolerated the procedure well. Scope In: Scope Out: Findings:                 The esophagus was normal.                           The stomach was normal.                           The examined duodenum was normal.                           The cardia and gastric fundus were normal on                            retroflexion. Complications:            No immediate complications. Estimated Blood Loss:     Estimated blood loss: none. Impression:               1. Normal EGD                           2. GERD. Recommendation:           - Patient has a contact number available for                            emergencies. The signs and symptoms of potential                            delayed complications were discussed with the                            patient. Return to normal activities tomorrow.                            Written discharge instructions were provided to the  patient.                           - Resume previous diet.                           - Continue present medications.                           -Return to the care of your primary provider Docia Chuck. Henrene Pastor, MD 10/11/2021 10:18:31 AM This report has been signed electronically.

## 2021-10-11 NOTE — Progress Notes (Signed)
HISTORY OF PRESENT ILLNESS:   Veronica [name] is a 71 y.o. female, native of [place], who presents today at the request of her primary care provider regarding problems with epigastric pain, substernal discomfort, and dysphagia.  Patient states that she has had symptoms for greater than 1 year.  At one point she was placed on omeprazole 40 mg daily.  This is helped a lot.  She tells me that she has had problems with shortness of breath and anxiety with panic attacks.  She been evaluated by cardiology, pulmonology, and allergist.  She also tells me that she has tenderness in 1 area of the epigastric region which she wants me to examine.  This is her first visit to our clinic.  She has had GI care elsewhere including colonoscopy in 2005 with Dr. Barnett Abu and colonoscopy more recently in 2016 with Dr. Laural Benes.  Found to have a diminutive adenoma at that time.  She has no lower GI complaints.  She is accompanied today by her friend from church who serves as a Hospital doctor but does not speak [place].  Review of blood work from July 2023 shows normal hemoglobin 13.1.   REVIEW OF SYSTEMS:   All non-GI ROS negative unless otherwise stated in the HPI except for allergies, anxiety, back pain, visual change, fatigue, headache, fever, hearing problems, itching problem muscle cramps, shortness of breath, sleeping problems, ankle swelling, excessive thirst, voice change       Past Medical History:  Diagnosis Date   Allergic rhinitis     Anxiety and depression     Asthma     Constipation, chronic     COPD (chronic obstructive pulmonary disease) (HCC)     Coronary artery calcification seen on CAT scan 06/2010    Mild nonobstructive CAD on cardiac CT  -as of 2021, coronary calcium score 34   Dyspnea     Hemorrhoids     Hip pain, bilateral     Hyperlipidemia     Hypertension     Leukoplakia of vagina     Low back pain      w/ left side radiculopathy, s/p injection at L4-L5 forarmen 1/06   Uterine fibroid              Past Surgical History:  Procedure Laterality Date   ABDOMINAL HYSTERECTOMY   11-2000    no oophorectomy per pt   CHOLECYSTECTOMY   12/08/2010    Procedure: LAPAROSCOPIC CHOLECYSTECTOMY WITH INTRAOPERATIVE CHOLANGIOGRAM;  Surgeon: [name], MD;  Location: [facility];  Service: General;  Laterality: N/A;  c-arm    COLONOSCOPY WITH PROPOFOL N/A 02/02/2014    Procedure: COLONOSCOPY WITH PROPOFOL;  Surgeon: Charolett Bumpers, MD;  Location: [facility];  Service: Endoscopy;  Laterality: N/A;   CORONARY CALCIUM SCORE   12/2018    Coronary calcium score-34   CORONARY CT ANGIOGRAM   06/2010     Coronary calcium score 12.  Mild nonobstructive disease.  Ostial LAD and first D1/RI.  Right dominant.  Focal scarring and bronchiectasis of medial right lower lobe.   CYST EXCISION Left 08/08/2021    Procedure: EXCISION LEFT UPPER BACK SEBACEOUS CYST X2;  Surgeon: Abigail Miyamoto, MD;  Location: [facility];  Service: General;  Laterality: Left;   LEEP        unknown date    NM MYOVIEW LTD   10/2010    Nonischemic.  Normal EF.   TUBAL LIGATION        bilateral 1989  UTERINE FIBROID SURGERY          Social History Veronica [name]  reports that she has never smoked. She has never used smokeless tobacco. She reports that she does not drink alcohol and does not use drugs.   family history includes AAA (abdominal aortic aneurysm) (age of onset: 81) in her father; Anxiety disorder in her mother; CVA in her brother; Heart attack (age of onset: 73) in her mother; Heart disease in her brother, father, and mother; Hyperlipidemia in her father and mother; Hypertension in her father and mother; Schizophrenia in her brother, son, and another family member; Valvular heart disease in her brother.        Allergies  Allergen Reactions   No [employer] Food Allergies Other (See Comments)      Cheese-elevated blood pressure          PHYSICAL EXAMINATION: Vital signs: BP (!) 152/92   Pulse 65   Ht $R'5\' 5"'Ej$  (1.651 m)    Wt 202 lb (91.6 kg)   BMI 33.61 kg/m   Constitutional: generally well-appearing, no acute distress Psychiatric: alert and oriented x3, cooperative Eyes: extraocular movements intact, anicteric, conjunctiva pink Mouth: oral pharynx moist, no lesions Neck: supple no lymphadenopathy Chest: Tenderness to touch over the xiphoid process.  This reproduces discomfort that she has had Cardiovascular: heart regular rate and rhythm, no murmur Lungs: clear to auscultation bilaterally Abdomen: soft, nontender, nondistended, no obvious ascites, no peritoneal signs, normal bowel sounds, no organomegaly Rectal: Omitted Extremities: no clubbing cyanosis, or lower extremity edema bilaterally Skin: no lesions on visible extremities Neuro: No focal deficits.  Cranial nerves intact   ASSESSMENT:   1.  Chronic GERD 2.  Intermittent dysphagia 3.  Tenderness of the xiphoid process.  Nonpathologic 4.  Anxiety, including health related anxiety 5.  History of diminutive adenoma 2016   PLAN:   1.  Reflux precautions 2.  Continue omeprazole 40 mg daily 3.  She is not taking prescribed Carafate.  Told not to. 4.  Schedule upper endoscopy with possible esophageal dilation.The nature of the procedure, as well as the risks, benefits, and alternatives were carefully and thoroughly reviewed with the patient. Ample time for discussion and questions allowed. The patient understood, was satisfied, and agreed to proceed.  5.  Surveillance colonoscopy would be due around January 2026 6.  Return to PCP for a myriad of non-GI complaints.

## 2021-10-11 NOTE — Progress Notes (Signed)
Patient states no changes in medical or surgical history since office visit. SChaplin, RN,BSN

## 2021-10-11 NOTE — Progress Notes (Signed)
No path.

## 2021-10-12 ENCOUNTER — Telehealth: Payer: Self-pay | Admitting: *Deleted

## 2021-10-12 NOTE — Telephone Encounter (Signed)
  Follow up Call-    Row Labels 10/11/2021    9:23 AM  Call back number   Section Header. No data exists in this row.   Post procedure Call Back phone  #   [phone]  Permission to leave phone message   Yes     Patient questions:  Do you have a fever, pain , or abdominal swelling? No. Pain Score  0 *  Have you tolerated food without any problems? Yes.    Have you been able to return to your normal activities? Yes.    Do you have any questions about your discharge instructions: Diet   No. Medications  No. Follow up visit  No.  Do you have questions or concerns about your Care? No.  Actions: * If pain score is 4 or above: No action needed, pain <4.

## 2021-10-17 ENCOUNTER — Ambulatory Visit: Payer: Medicare Other | Admitting: Nurse Practitioner

## 2021-10-20 ENCOUNTER — Telehealth: Payer: Self-pay | Admitting: Family Medicine

## 2021-10-20 NOTE — Telephone Encounter (Signed)
[facility] states they have not been able to reach patient with the number on file, verified number, has left messages on different occassions and cannot reach patient

## 2021-10-20 NOTE — Telephone Encounter (Signed)
Noted  

## 2021-11-01 ENCOUNTER — Ambulatory Visit: Payer: Medicare PPO | Admitting: Cardiology

## 2021-11-07 DIAGNOSIS — M5451 Vertebrogenic low back pain: Secondary | ICD-10-CM | POA: Diagnosis not present

## 2021-11-21 DIAGNOSIS — M5451 Vertebrogenic low back pain: Secondary | ICD-10-CM | POA: Diagnosis not present

## 2021-12-06 ENCOUNTER — Other Ambulatory Visit: Payer: Self-pay | Admitting: Family Medicine

## 2021-12-06 DIAGNOSIS — F411 Generalized anxiety disorder: Secondary | ICD-10-CM

## 2021-12-07 ENCOUNTER — Other Ambulatory Visit: Payer: Self-pay | Admitting: Cardiology

## 2021-12-21 ENCOUNTER — Other Ambulatory Visit: Payer: Self-pay | Admitting: Cardiology

## 2022-01-11 ENCOUNTER — Telehealth: Payer: Self-pay | Admitting: Cardiology

## 2022-01-11 DIAGNOSIS — R002 Palpitations: Secondary | ICD-10-CM

## 2022-01-11 DIAGNOSIS — E785 Hyperlipidemia, unspecified: Secondary | ICD-10-CM

## 2022-01-11 DIAGNOSIS — Z79899 Other long term (current) drug therapy: Secondary | ICD-10-CM

## 2022-01-11 NOTE — Telephone Encounter (Signed)
Last lipids June 2023  Routed to MD/RN as there are no new active orders for lipid panel

## 2022-01-11 NOTE — Telephone Encounter (Signed)
Pt calling because she would like to have her cholesterol checked.

## 2022-01-11 NOTE — Telephone Encounter (Signed)
She was just seen by Raquel Sarna back in June.  The note indicates that the plan was to check lipid panel that day.  Apparently did not happen.  Fine with ordering lipids and chemistry panel.  Glenetta Hew, MD

## 2022-01-12 ENCOUNTER — Other Ambulatory Visit: Payer: Self-pay

## 2022-01-12 DIAGNOSIS — R002 Palpitations: Secondary | ICD-10-CM

## 2022-01-12 DIAGNOSIS — Z79899 Other long term (current) drug therapy: Secondary | ICD-10-CM

## 2022-01-12 DIAGNOSIS — E785 Hyperlipidemia, unspecified: Secondary | ICD-10-CM

## 2022-01-12 NOTE — Telephone Encounter (Signed)
Pt is calling back for update. Requesting return call  

## 2022-01-12 NOTE — Telephone Encounter (Signed)
Patient informed to have fasting blood work. She will come on Monday. Instructed not to eat after midnight, but to drink glass of water before coming to lab. Orders placed.

## 2022-01-15 DIAGNOSIS — E785 Hyperlipidemia, unspecified: Secondary | ICD-10-CM | POA: Diagnosis not present

## 2022-01-15 DIAGNOSIS — Z79899 Other long term (current) drug therapy: Secondary | ICD-10-CM | POA: Diagnosis not present

## 2022-01-15 DIAGNOSIS — R002 Palpitations: Secondary | ICD-10-CM | POA: Diagnosis not present

## 2022-01-15 LAB — COMPREHENSIVE METABOLIC PANEL
ALT: 9 IU/L (ref 0–32)
AST: 15 IU/L (ref 0–40)
Albumin/Globulin Ratio: 1.8 (ref 1.2–2.2)
Albumin: 4.4 g/dL (ref 3.9–4.9)
Alkaline Phosphatase: 75 IU/L (ref 44–121)
BUN/Creatinine Ratio: 16 (ref 12–28)
BUN: 12 mg/dL (ref 8–27)
Bilirubin Total: 0.4 mg/dL (ref 0.0–1.2)
CO2: 23 mmol/L (ref 20–29)
Calcium: 9.3 mg/dL (ref 8.7–10.3)
Chloride: 104 mmol/L (ref 96–106)
Creatinine, Ser: 0.76 mg/dL (ref 0.57–1.00)
Globulin, Total: 2.4 g/dL (ref 1.5–4.5)
Glucose: 81 mg/dL (ref 70–99)
Potassium: 4 mmol/L (ref 3.5–5.2)
Sodium: 137 mmol/L (ref 134–144)
Total Protein: 6.8 g/dL (ref 6.0–8.5)
eGFR: 85 mL/min/{1.73_m2} (ref >59)

## 2022-01-15 LAB — LIPID PANEL
Chol/HDL Ratio: 4.6 ratio — ABNORMAL HIGH (ref 0.0–4.4)
Cholesterol, Total: 215 mg/dL — ABNORMAL HIGH (ref 100–199)
HDL: 47 mg/dL (ref >39)
LDL Chol Calc (NIH): 148 mg/dL — ABNORMAL HIGH (ref 0–99)
Triglycerides: 111 mg/dL (ref 0–149)
VLDL Cholesterol Cal: 20 mg/dL (ref 5–40)

## 2022-02-04 ENCOUNTER — Other Ambulatory Visit: Payer: Self-pay | Admitting: Family Medicine

## 2022-02-07 ENCOUNTER — Ambulatory Visit (INDEPENDENT_AMBULATORY_CARE_PROVIDER_SITE_OTHER): Payer: 59 | Admitting: Family Medicine

## 2022-02-07 ENCOUNTER — Encounter: Payer: Self-pay | Admitting: Family Medicine

## 2022-02-07 VITALS — BP 122/80 | HR 85 | Temp 98.6°F | Resp 16 | Ht 65.0 in | Wt 199.4 lb

## 2022-02-07 DIAGNOSIS — F411 Generalized anxiety disorder: Secondary | ICD-10-CM

## 2022-02-07 DIAGNOSIS — E785 Hyperlipidemia, unspecified: Secondary | ICD-10-CM | POA: Diagnosis not present

## 2022-02-07 DIAGNOSIS — I10 Essential (primary) hypertension: Secondary | ICD-10-CM

## 2022-02-07 DIAGNOSIS — R0789 Other chest pain: Secondary | ICD-10-CM | POA: Diagnosis not present

## 2022-02-07 DIAGNOSIS — I700 Atherosclerosis of aorta: Secondary | ICD-10-CM

## 2022-02-07 MED ORDER — LOSARTAN POTASSIUM 100 MG PO TABS: 100.0000 mg | ORAL_TABLET | Freq: Every day | ORAL | 0 refills | Status: DC

## 2022-02-07 MED ORDER — CITALOPRAM HYDROBROMIDE 10 MG PO TABS: 10.0000 mg | ORAL_TABLET | Freq: Every day | ORAL | 2 refills | Status: DC

## 2022-02-07 NOTE — Assessment & Plan Note (Addendum)
We discussed possible etiologies. Some or chronic medical problems could be aggravating factors, GERD and anxiety. EKG today negative for ischemic changes. When compared with EKG done on 02/05/2021 nonspecific T wave abnormalities (DIII) are no longer present, slower R wave progression but in general no major changes.  Compared with EKG done in 12/2020 no significant changes. Clearly instructed about warning signs.

## 2022-02-07 NOTE — Assessment & Plan Note (Signed)
Worse after the death of her sister. In the past she took sertraline but felt like it increased BP. She agrees with trying Celexa 10 mg daily. I think she would benefit from CBT, name and contact information of providers in the area given, so she can call and arrange appointment. Follow-up in 6 weeks, before if needed.

## 2022-02-07 NOTE — Assessment & Plan Note (Addendum)
Reporting elevated BPs at home. Re-checked x 2: 145/70 and 140/70. We discussed possible complications of elevated BP. She agrees with increasing dose of losartan from 50 mg to 100 mg. Continue amlodipine 10 mg daily. Low-salt diet. Continue monitoring BP regularly. Follow-up in 6 weeks, before if needed.

## 2022-02-07 NOTE — Progress Notes (Signed)
ACUTE VISIT Chief Complaint  Patient presents with   Anxiety   Chest Pain   HPI: [name] is a 72 y.o. female, who is here today complaining of intermittent episodes of anterior CP, which she has had for a while but seems to be worse since her sister illness and death. She thinks CP is caused by anxiety. She cannot described pain, maybe like tightness. She reports that the pain is located in the middle of her chest and occurs suddenly. The pain can be constant for hours when she experiences anxiety.   Chest Pain  This is a recurrent problem. The current episode started more than 1 month ago. The onset quality is gradual. The problem occurs constantly. The pain is mild. The quality of the pain is described as tightness. The pain does not radiate. Associated symptoms include malaise/fatigue. Pertinent negatives include no abdominal pain, claudication, cough, diaphoresis, dizziness, exertional chest pressure, fever, headaches, irregular heartbeat, leg pain, lower extremity edema, nausea, near-syncope, numbness, orthopnea, palpitations, PND, shortness of breath, syncope, vomiting or weakness. The pain is aggravated by emotional upset. She has tried nothing for the symptoms.  Her past medical history is significant for hyperlipidemia and hypertension.  Her family medical history is significant for heart disease.   She has been managing her anxiety with [place] and takes Alprazolam 0.25 mg at bedtime as needed.   She has seen cardiologist and has had tests done, with no significant cardiac findings.  CT coronary calcium score in 03/2021: 1. Calcium Score 28.4 which is 70 th percentile for age/sex.  2.  Normal ascending thoracic aorta 3.3 cm.  3.  CAD RADS 1 non obstructive CAD.  GERD:She denies having reflux.  Her older sister recently passed away about 4 days ago after a few days of hospitalization (01/23/22), this has exacerbated her anxiety. She has previously tried Zoloft but  stopped taking it due to concerns about her blood pressure.  Her blood pressure at home ranges from 150/70 to 145/75. She is currently taking amlodipine 10 mg daily and losartan 50 mg daily. Denies severe/frequent headache, visual changes, focal weakness, or edema.  She is concerned about recent lipid panel numbers. She had her cholesterol last checked in December/2023, and her LDL cholesterol was found to be elevated at 148, up from 69 when she was taking Rosuvastatin. She stopped Rosuvastatin because numbers were better , resumed a few weeks ago. Lab Results  Component Value Date   CHOL 215 (H) 01/15/2022   HDL 47 01/15/2022   LDLCALC 148 (H) 01/15/2022   LDLDIRECT 161.6 06/03/2012   TRIG 111 01/15/2022   CHOLHDL 4.6 (H) 01/15/2022  Review of Systems  Constitutional:  Positive for malaise/fatigue. Negative for diaphoresis and fever.  Respiratory:  Negative for cough, shortness of breath and wheezing.   Cardiovascular:  Positive for chest pain. Negative for palpitations, orthopnea, claudication, syncope, PND and near-syncope.  Gastrointestinal:  Negative for abdominal pain, nausea and vomiting.  Endocrine: Negative for cold intolerance and heat intolerance.  Neurological:  Negative for dizziness, weakness, numbness and headaches.  See other pertinent positives and negatives in HPI.  Current Outpatient Medications on File Prior to Visit  Medication Sig Dispense Refill   acetaminophen (TYLENOL) 500 MG tablet Take 500-1,000 mg by mouth every 6 (six) hours as needed (pain.).     ALPRAZolam (XANAX) 0.25 MG tablet TAKE 1 TABLET (0.25 MG TOTAL) BY MOUTH AT BEDTIME AS NEEDED FOR ANXIETY OR SLEEP. 30 tablet 1   amLODipine (NORVASC)  10 MG tablet TAKE 1 TABLET BY MOUTH EVERY DAY 90 tablet 1   omeprazole (PRILOSEC) 40 MG capsule TAKE 1 CAPSULE (40 MG TOTAL) BY MOUTH DAILY. 60 capsule 0   rosuvastatin (CRESTOR) 20 MG tablet Take 1 tablet (20 mg total) by mouth daily. (Patient taking differently:  Take 20 mg by mouth every evening.) 90 tablet 3   BREO ELLIPTA 200-25 MCG/ACT AEPB TAKE 1 PUFF BY MOUTH EVERY DAY (Patient not taking: Reported on 10/06/2021) 60 each 11   No current facility-administered medications on file prior to visit.    Past Medical History:  Diagnosis Date   Allergic rhinitis    Anxiety and depression    Asthma    Constipation, chronic    COPD (chronic obstructive pulmonary disease) (HCC)    Coronary artery calcification seen on CAT scan 06/2010   Mild nonobstructive CAD on cardiac CT  -as of 2021, coronary calcium score 34   Dyspnea    Hemorrhoids    Hip pain, bilateral    Hyperlipidemia    Hypertension    Leukoplakia of vagina    Low back pain    w/ left side radiculopathy, s/p injection at L4-L5 forarmen 1/06   Uterine fibroid    Allergies  Allergen Reactions   No [employer] Food Allergies Other (See Comments)    Cheese-elevated blood pressure   Peanut-Containing Drug Products Other (See Comments)    headache    Social History   Socioeconomic History   Marital status: Divorced    Spouse name: Not on file   Number of children: 3   Years of education: Not on file   Highest education level: Not on file  Occupational History   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   Social Determinants of Health   Financial Resource Strain: Low Risk  (09/18/2021)   Overall Financial Resource Strain (CARDIA)    Difficulty of  Paying Living Expenses: Not hard at all  Food Insecurity: No Food Insecurity (09/18/2021)   Hunger Vital Sign    Worried About Running Out of Food in the Last Year: Never true    Ran Out of Food in the Last Year: Never true  Transportation Needs: No Transportation Needs (09/18/2021)   PRAPARE - Hydrologist (Medical): No    Lack of Transportation (Non-Medical): No  Physical Activity: Sufficiently Active (09/18/2021)   Exercise Vital Sign    Days of Exercise per Week: 5 days    Minutes of Exercise per Session: 50 min  Stress: No Stress Concern Present (09/18/2021)   [place]    Feeling of Stress : Not at all  Social Connections: Moderately Integrated (09/18/2021)   Social Connection and Isolation Panel [NHANES]    Frequency of Communication with Friends and Family: More than three times a week  Frequency of Social Gatherings with Friends and Family: More than three times a week    Attends Religious Services: More than 4 times per year    Active Member of Clubs or Organizations: Yes    Attends Archivist Meetings: More than 4 times per year    Marital Status: Divorced   Vitals:   02/07/22 0656  BP: 122/80  Pulse: 85  Resp: 16  Temp: 98.6 F (37 C)  SpO2: 97%   Body mass index is 33.18 kg/m.  Physical Exam Vitals and nursing note reviewed.  Constitutional:      General: She is not in acute distress.    Appearance: She is well-developed.  HENT:     Head: Normocephalic and atraumatic.     Mouth/Throat:     Mouth: Mucous membranes are moist.     Pharynx: Oropharynx is clear.  Eyes:     Conjunctiva/sclera: Conjunctivae normal.  Cardiovascular:     Rate and Rhythm: Normal rate and regular rhythm.     Pulses:          Dorsalis pedis pulses are 2+ on the right side and 2+ on the left side.     Heart sounds: No murmur heard. Pulmonary:     Effort: Pulmonary effort is  normal. No respiratory distress.     Breath sounds: Normal breath sounds.  Abdominal:     Palpations: Abdomen is soft. There is no hepatomegaly or mass.     Tenderness: There is no abdominal tenderness.  Lymphadenopathy:     Cervical: No cervical adenopathy.  Skin:    General: Skin is warm.     Findings: No erythema or rash.  Neurological:     General: No focal deficit present.     Mental Status: She is alert and oriented to person, place, and time.     Cranial Nerves: No cranial nerve deficit.     Gait: Gait normal.  Psychiatric:        Mood and Affect: Affect normal. Mood is anxious.   ASSESSMENT AND PLAN: Mir Homen is a 72 year old female seen today for CP and anxiety.  Essential hypertension Assessment & Plan: Reporting elevated BPs at home. Re-checked x 2: 145/70 and 140/70. We discussed possible complications of elevated BP. She agrees with increasing dose of losartan from 50 mg to 100 mg. Continue amlodipine 10 mg daily. Low-salt diet. Continue monitoring BP regularly. Follow-up in 6 weeks, before if needed.  Orders: -     Losartan Potassium; Take 1 tablet (100 mg total) by mouth daily.  Dispense: 90 tablet; Refill: 0 -     EKG 12-Lead  Chest discomfort Assessment & Plan: We discussed possible etiologies. Some or chronic medical problems could be aggravating factors, GERD and anxiety. EKG today negative for ischemic changes. When compared with EKG done on 02/05/2021 nonspecific T wave abnormalities (DIII) are no longer present, slower R wave progression but in general no major changes.  Compared with EKG done in 12/2020 no significant changes. Clearly instructed about warning signs.  Orders: -     EKG 12-Lead  Generalized anxiety disorder Assessment & Plan: Worse after the death of her sister. In the past she took sertraline but felt like it increased BP. She agrees with trying Celexa 10 mg daily. I think she would benefit from CBT, name and contact  information of providers in the area given, so she can call and arrange appointment. Follow-up in 6 weeks, before if needed.  Orders: -  Citalopram Hydrobromide; Take 1 tablet (10 mg total) by mouth daily.  Dispense: 30 tablet; Refill: 2  Aortic atherosclerosis (HCC) Assessment & Plan: Continue rosuvastatin 20 mg daily.   Hyperlipidemia with target LDL less than 100 Assessment & Plan: She just resume rosuvastatin 20 mg, statin importance of being compliant with taking medication to keep LDL at goal. LDL was 61 when she was on statin. Continue low-fat diet.   Return in about 6 weeks (around 03/21/2022) for chronic problems.  Kemba Hoppes G. Martinique, MD  [facility]. [place] office.

## 2022-02-07 NOTE — Assessment & Plan Note (Signed)
Continue rosuvastatin 20 mg daily. 

## 2022-02-07 NOTE — Assessment & Plan Note (Signed)
She just resume rosuvastatin 20 mg, statin importance of being compliant with taking medication to keep LDL at goal. LDL was 61 when she was on statin. Continue low-fat diet.

## 2022-02-07 NOTE — Patient Instructions (Signed)
A few things to remember from today's visit:  Essential hypertension - Plan: losartan (COZAAR) 100 MG tablet, EKG 12-Lead  Chest discomfort - Plan: EKG 12-Lead  Generalized anxiety disorder - Plan: citalopram (CELEXA) 10 MG tablet  Today Losartan increased from 50 mg to 100 mg. No changes in Amlodipine. Continue monitoring blood pressure.  Celexa 10 mg daily for anxiety.  If you need refills for medications you take chronically, please call your pharmacy. Do not use My Chart to request refills or for acute issues that need immediate attention. If you send a my chart message, it may take a few days to be addressed, specially if I am not in the office.  Please be sure medication list is accurate. If a new problem present, please set up appointment sooner than planned today.

## 2022-02-23 ENCOUNTER — Telehealth (INDEPENDENT_AMBULATORY_CARE_PROVIDER_SITE_OTHER): Payer: 59 | Admitting: Family Medicine

## 2022-02-23 ENCOUNTER — Telehealth: Payer: BC Managed Care – PPO | Admitting: Adult Health

## 2022-02-23 ENCOUNTER — Encounter: Payer: Self-pay | Admitting: Family Medicine

## 2022-02-23 VITALS — Ht 65.0 in

## 2022-02-23 DIAGNOSIS — F411 Generalized anxiety disorder: Secondary | ICD-10-CM

## 2022-02-23 DIAGNOSIS — F419 Anxiety disorder, unspecified: Secondary | ICD-10-CM

## 2022-02-23 DIAGNOSIS — F5105 Insomnia due to other mental disorder: Secondary | ICD-10-CM | POA: Diagnosis not present

## 2022-02-23 MED ORDER — TRAZODONE HCL 50 MG PO TABS: 25.0000 mg | ORAL_TABLET | Freq: Every evening | ORAL | 1 refills | Status: DC | PRN

## 2022-02-23 NOTE — Progress Notes (Unsigned)
Virtual Visit via Video Note I connected with Veronica [name] on 02/24/22 by a video enabled telemedicine application and verified that I am speaking with the correct person using two identifiers. Location patient: home Location provider:work office Persons participating in the virtual visit: patient, provider  I discussed the limitations of evaluation and management by telemedicine and the availability of in person appointments. The patient expressed understanding and agreed to proceed.  Chief Complaint  Patient presents with   Anxiety   Insomnia   HPI: Veronica [name] is a 72 yo female with PMHx significant for anxiety,HTN,and HLD c/o difficulty sleeping since the last visit on 02/07/22. At that time she was reporting worsening anxiety, Celexa 10 mg daily was started. She tried Sertraline in the past, but she thought it was causing high BP. She states that  Celexa 10 mg has been helpful, but she continues to experience significant sleep disturbances. She has difficulty falling asleep and staying asleep, waking up at 2:30 AM, unable to return to sleep. She has been using natural remedies such as valerian to initiate sleep but still struggles with maintaining it. She estimates that she sleeps for 3 to 4 hours per night. Worsening stress. She is currently experiencing family difficulties, including the process of obtaining guardianship and power of attorney for her mentally ill son. This situation is contributing to her anxiety and sleep issues.  ROS: See pertinent positives and negatives per HPI.  Past Medical History:  Diagnosis Date   Allergic rhinitis    Anxiety and depression    Asthma    Constipation, chronic    COPD (chronic obstructive pulmonary disease) ([place])    Coronary artery calcification seen on CAT scan 06/2010   Mild nonobstructive CAD on cardiac CT  -as of 2021, coronary calcium score 34   Dyspnea    Hemorrhoids    Hip pain, bilateral    Hyperlipidemia    Hypertension     Leukoplakia of vagina    Low back pain    w/ left side radiculopathy, s/p injection at L4-L5 forarmen 1/06   Uterine fibroid     Past Surgical History:  Procedure Laterality Date   ABDOMINAL HYSTERECTOMY  11-2000   no oophorectomy per pt   CHOLECYSTECTOMY  12/08/2010   Procedure: LAPAROSCOPIC CHOLECYSTECTOMY WITH INTRAOPERATIVE CHOLANGIOGRAM;  Surgeon: Pedro Earls, MD;  Location: [facility];  Service: General;  Laterality: N/A;  c-arm    COLONOSCOPY WITH PROPOFOL N/A 02/02/2014   Procedure: COLONOSCOPY WITH PROPOFOL;  Surgeon: Garlan Fair, MD;  Location: [facility];  Service: Endoscopy;  Laterality: N/A;   CORONARY CALCIUM SCORE  12/2018   Coronary calcium score-34   CORONARY CT ANGIOGRAM  06/2010    Coronary calcium score 12.  Mild nonobstructive disease.  Ostial LAD and first D1/RI.  Right dominant.  Focal scarring and bronchiectasis of medial right lower lobe.   CYST EXCISION Left 08/08/2021   Procedure: EXCISION LEFT UPPER BACK SEBACEOUS CYST X2;  Surgeon: Coralie Keens, MD;  Location: [facility];  Service: General;  Laterality: Left;   LEEP     unknown date    NM MYOVIEW LTD  10/2010   Nonischemic.  Normal EF.   TUBAL LIGATION     bilateral 1989   UTERINE FIBROID SURGERY      Family History  Problem Relation Age of Onset   Hypertension Mother    Hyperlipidemia Mother    Heart attack Mother 86   Heart disease Mother    Anxiety disorder  Mother    Hypertension Father    Hyperlipidemia Father    AAA (abdominal aortic aneurysm) Father 66   Heart disease Father    Schizophrenia Brother    CVA Brother    Valvular heart disease Brother        Valve Surgery   Heart disease Brother        Vavle surgery   Esophageal cancer Maternal Grandmother    Schizophrenia Son    Schizophrenia Other        2 nieces   Diabetes Neg Hx    Colon cancer Neg Hx    Breast cancer Neg Hx     Social History   Socioeconomic History   Marital status: Divorced    Spouse name: Not on  file   Number of children: 3   Years of education: Not on file   Highest education level: Not on file  Occupational History   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   Social Determinants of Health   Financial Resource Strain: Low Risk  (09/18/2021)   Overall Financial Resource Strain (CARDIA)    Difficulty of Paying Living Expenses: Not hard at all  Food Insecurity: No Food Insecurity (09/18/2021)   Hunger Vital Sign    Worried About Running Out of Food in the Last Year: Never true    Ran Out of Food in the Last Year: Never true  Transportation Needs: No Transportation Needs (09/18/2021)   PRAPARE - Hydrologist (Medical): No    Lack of Transportation (Non-Medical): No  Physical Activity: Sufficiently Active (09/18/2021)   Exercise Vital Sign    Days of Exercise per Week: 5 days    Minutes of Exercise per Session: 50 min  Stress: No Stress Concern Present (09/18/2021)   [place]    Feeling of Stress : Not at all  Social Connections: Moderately Integrated (09/18/2021)   Social Connection and Isolation Panel [NHANES]    Frequency of Communication with Friends and Family: More than three times a week    Frequency of Social Gatherings with Friends and Family: More than three times a week    Attends Religious Services: More than 4 times per year    Active Member of  [employer] or Organizations: Yes    Attends Music therapist: More than 4 times per year    Marital Status: Divorced  Intimate Partner Violence: Not At Risk (09/18/2021)   Humiliation, Afraid, Rape, and Kick questionnaire    Fear of Current or Ex-Partner: No    Emotionally Abused: No    Physically Abused: No    Sexually Abused: No     Current Outpatient Medications:    acetaminophen (TYLENOL) 500 MG tablet, Take 500-1,000 mg by mouth every 6 (six) hours as needed (pain.)., Disp: , Rfl:  ALPRAZolam (XANAX) 0.25 MG tablet, TAKE 1 TABLET (0.25 MG TOTAL) BY MOUTH AT BEDTIME AS NEEDED FOR ANXIETY OR SLEEP., Disp: 30 tablet, Rfl: 1   amLODipine (NORVASC) 10 MG tablet, TAKE 1 TABLET BY MOUTH EVERY DAY, Disp: 90 tablet, Rfl: 1   BREO ELLIPTA 200-25 MCG/ACT AEPB, TAKE 1 PUFF BY MOUTH EVERY DAY, Disp: 60 each, Rfl: 11   citalopram (CELEXA) 10 MG tablet, Take 1 tablet (10 mg total) by mouth daily., Disp: 30 tablet, Rfl: 2   losartan (COZAAR) 100 MG tablet, Take 1 tablet (100 mg total) by mouth daily., Disp: 90 tablet, Rfl: 0   omeprazole (PRILOSEC) 40 MG capsule, TAKE 1 CAPSULE (40 MG TOTAL) BY MOUTH DAILY., Disp: 60 capsule, Rfl: 0   rosuvastatin (CRESTOR) 20 MG tablet, Take 1 tablet (20 mg total) by mouth daily. (Patient taking differently: Take 20 mg by mouth every evening.), Disp: 90 tablet, Rfl: 3   traZODone (DESYREL) 50 MG tablet, Take 0.5-1 tablets (25-50 mg total) by mouth at bedtime as needed for sleep., Disp: 30 tablet, Rfl: 1  EXAM:  VITALS per patient if applicable:Ht '5\' 5"'$  (1.651 m)   BMI 33.18 kg/m   GENERAL: alert, oriented, appears well and in no acute distress  HEENT: atraumatic, conjunctiva clear, no obvious abnormalities on inspection of external nose and ears  NECK: normal movements of the head and neck  LUNGS: on inspection no signs of respiratory distress, breathing rate appears normal, no obvious gross SOB, gasping or wheezing  CV: no obvious cyanosis  MS:  moves all visible extremities without noticeable abnormality  PSYCH/NEURO: pleasant and cooperative, no obvious depression or anxiety, speech and thought processing grossly intact  ASSESSMENT AND PLAN:  Discussed the following assessment and plan:  Insomnia secondary to anxiety Assessment & Plan: Problem is not well controlled. We discussed pharmacologic options, she agrees with trying Trazodone 25-50 mg around bedtime. We discussed some side effects as well as the risk for interaction with Celexa. Continue good sleep hygiene. F/U in 2-3 months, before if needed.  Orders: -     traZODone HCl; Take 0.5-1 tablets (25-50 mg total) by mouth at bedtime as needed for sleep.  Dispense: 30 tablet; Refill: 1  Generalized anxiety disorder Assessment & Plan: Symptoms have improved with Celexa 10 mg daily, so no changes. Trazodone added today for sleep. F/U in 2-3 months.   We discussed possible serious and likely etiologies, options for evaluation and workup, limitations of telemedicine visit vs in person visit, treatment, treatment risks and precautions. The patient was advised to call back or seek an in-person evaluation if the symptoms worsen or if the condition fails to improve as anticipated. I discussed the assessment and treatment plan with the patient. The patient was provided an opportunity to ask questions and all were answered. The patient agreed with the plan and demonstrated an understanding of the instructions.  Return in about 2 months (around 05/01/2022) for chronic problems. Liddy Deam G. Martin[place], MD  [facility] [place] office.

## 2022-02-24 ENCOUNTER — Encounter: Payer: Self-pay | Admitting: Family Medicine

## 2022-02-24 NOTE — Assessment & Plan Note (Signed)
Problem is not well controlled. We discussed pharmacologic options, she agrees with trying Trazodone 25-50 mg around bedtime. We discussed some side effects as well as the risk for interaction with Celexa. Continue good sleep hygiene. F/U in 2-3 months, before if needed.

## 2022-02-24 NOTE — Assessment & Plan Note (Signed)
Symptoms have improved with Celexa 10 mg daily, so no changes. Trazodone added today for sleep. F/U in 2-3 months.

## 2022-02-27 ENCOUNTER — Telehealth: Payer: Self-pay | Admitting: Family Medicine

## 2022-02-27 NOTE — Telephone Encounter (Signed)
[place] with [facility] is calling and they would like to start pt on new medication and would like pt to have baseline EKG first. Please advise

## 2022-02-27 NOTE — Telephone Encounter (Signed)
Spoke with [place], EKG done back on 02/07/22. Copy faxed.

## 2022-03-04 ENCOUNTER — Other Ambulatory Visit: Payer: Self-pay | Admitting: Nurse Practitioner

## 2022-03-19 ENCOUNTER — Other Ambulatory Visit: Payer: Self-pay | Admitting: Family Medicine

## 2022-03-19 DIAGNOSIS — I10 Essential (primary) hypertension: Secondary | ICD-10-CM

## 2022-03-21 NOTE — Progress Notes (Signed)
ACUTE VISIT Chief Complaint  Patient presents with   Cough   HPI: [name] is a 72 y.o. female PMHx significant for anxiety,HTN,and HLD here today complaining of post nasal drip and phlegm and throat pruritus that have been going on for 3-4 weeks. She had sore throat about 3 weeks ago. She feels like she needs to clear her throat frequently. Phlegm is clear. Negative for hemoptysis. + nasal congestion and rhinorrhea. She denies fever, chills, dysphagia, dysphonia, cough, wheezing, and dyspnea.  She has been evaluated by immunologist for DOE and vocal cord dysfunction. History of asthma: Has followed with pulmonology, last visit on 03/01/2021.  GERD: Follows with GI, currently she is on omeprazole 40 mg daily. She has not noticed heartburn or nausea.  She reports recent weight loss due to improved eating habits and has been engaging in 30 minutes of daily exercise, primarily walking.   Anxiety: Stress at home has improved. Since her last visit she has established with psychiatrist and seeing psychotherapist once per month. She was started on hydralazine 25 mg to take daily as needed at bedtime, medication is helping her sleep better. She is no longer on Xanax. She is on Celexa 10 mg daily. She is reporting great improvement of symptoms.  I noticed today a mild tremor in left hand while she was holding phone up. She has not noted problems at home. She wonders if this is related to her medication but not concerned about it at this time.  Review of Systems  Constitutional:  Negative for chills and fever.  HENT:  Negative for mouth sores and sinus pain.   Respiratory:  Negative for stridor.   Cardiovascular:  Negative for chest pain and leg swelling.  Gastrointestinal:  Negative for abdominal pain and vomiting.  Musculoskeletal:  Negative for gait problem.  Skin:  Negative for rash.  Neurological:  Negative for syncope, facial asymmetry and weakness.  See other  pertinent positives and negatives in HPI.  Current Outpatient Medications on File Prior to Visit  Medication Sig Dispense Refill   ALPRAZolam (XANAX) 0.25 MG tablet TAKE 1 TABLET (0.25 MG TOTAL) BY MOUTH AT BEDTIME AS NEEDED FOR ANXIETY OR SLEEP. 30 tablet 1   amLODipine (NORVASC) 10 MG tablet TAKE 1 TABLET BY MOUTH EVERY DAY 90 tablet 1   BREO ELLIPTA 200-25 MCG/ACT AEPB TAKE 1 PUFF BY MOUTH EVERY DAY 60 each 11   citalopram (CELEXA) 10 MG tablet Take 1 tablet (10 mg total) by mouth daily. 30 tablet 2   losartan (COZAAR) 50 MG tablet Take 50 mg by mouth daily.     omeprazole (PRILOSEC) 40 MG capsule TAKE 1 CAPSULE (40 MG TOTAL) BY MOUTH DAILY. 60 capsule 0   rosuvastatin (CRESTOR) 20 MG tablet TAKE 1 TABLET BY MOUTH EVERY DAY 90 tablet 3   traZODone (DESYREL) 50 MG tablet Take 0.5-1 tablets (25-50 mg total) by mouth at bedtime as needed for sleep. 30 tablet 1   No current facility-administered medications on file prior to visit.    Past Medical History:  Diagnosis Date   Allergic rhinitis    Anxiety and depression    Asthma    Constipation, chronic    COPD (chronic obstructive pulmonary disease) ([place])    Coronary artery calcification seen on CAT scan 06/2010   Mild nonobstructive CAD on cardiac CT  -as of 2021, coronary calcium score 34   Dyspnea    Hemorrhoids    Hip pain, bilateral    Hyperlipidemia  Hypertension    Leukoplakia of vagina    Low back pain    w/ left side radiculopathy, s/p injection at L4-L5 for[place]en 1/06   Uterine fibroid    Allergies  Allergen Reactions   No [facility] Food Allergies Other (See Comments)    Cheese-elevated blood pressure   Peanut-Containing Drug Products Other (See Comments)    headache    Social History   Socioeconomic History   Marital status: Divorced    Spouse name: Not on file   Number of children: 3   Years of education: Not on file   Highest education level: Not on file  Occupational History   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   Social Determinants of Health   Financial Resource Strain: Low Risk  (09/18/2021)   Overall Financial Resource Strain (CARDIA)    Difficulty of Paying Living Expenses: Not hard at all  Food Insecurity: No Food Insecurity (09/18/2021)   Hunger Vital Sign    Worried About Running Out of Food in the Last Year: Never true    Ran Out of Food in the Last Year: Never true  Transportation Needs: No Transportation Needs (09/18/2021)   PRAPARE - Hydrologist (Medical): No    Lack of Transportation (Non-Medical): No  Physical Activity: Sufficiently Active (09/18/2021)   Exercise Vital Sign    Days of Exercise per Week: 5 days    Minutes of Exercise per Session: 50 min  Stress: No Stress Concern Present (09/18/2021)   [place]    Feeling of Stress : Not at all  Social Connections: Moderately Integrated (09/18/2021)   Social Connection and Isolation Panel [NHANES]    Frequency of Communication with Friends and Family: More than three times a week    Frequency of Social Gatherings with Friends and Family: More than three times a week    Attends Religious Services: More than 4 times per year    Active Member of Clubs or Organizations: Yes    Attends Archivist Meetings: More than 4 times per year    Marital Status: Divorced   Vitals:    03/23/22 0949  BP: 118/60  Pulse: 82  Resp: 16  Temp: 98.8 F (37.1 C)  SpO2: 98%  Body mass index is 32.32 kg/m.  Physical Exam Vitals and nursing note reviewed.  Constitutional:      General: She is not in acute distress.    Appearance: She is well-developed. She is not ill-appearing.  HENT:     Head: Normocephalic and atraumatic.     Right Ear: Tympanic membrane, ear canal and external ear normal.     Left Ear: Tympanic membrane, ear canal and external ear normal.     Nose: Rhinorrhea present.     Right Turbinates: Enlarged.  Left Turbinates: Enlarged.     Mouth/Throat:     Mouth: Mucous membranes are moist.     Pharynx: Oropharynx is clear.     Comments: Clear postnasal drainage. Eyes:     Conjunctiva/sclera: Conjunctivae normal.  Cardiovascular:     Rate and Rhythm: Normal rate and regular rhythm.     Heart sounds: No murmur heard. Pulmonary:     Effort: Pulmonary effort is normal. No respiratory distress.     Breath sounds: Normal breath sounds. No stridor.  Lymphadenopathy:     Head:     Right side of head: No submandibular adenopathy.     Left side of head: No submandibular adenopathy.     Cervical: No cervical adenopathy.  Skin:    General: Skin is warm.     Findings: No erythema or rash.  Neurological:     General: No focal deficit present.     Mental Status: She is alert and oriented to person, place, and time.     Motor: Tremor (Mild, bilateral in hands. Not present at rest.) present.     Gait: Gait normal.  Psychiatric:        Mood and Affect: Affect normal. Mood is anxious.   ASSESSMENT AND PLAN:  Ms. Fehlman was seen today for postnasal drainage.  Post-nasal drainage We discussed possible etiologies. History and examination do not suggest a serious process. Seasonal allergies could be a contributing factor. Recommend Flonase nasal spray daily for 10 to 14 days and then as needed. Monitor for new symptoms.  -     Fluticasone Propionate;  Place 1 spray into both nostrils 2 (two) times daily.  Dispense: 16 g; Refill: 3  Generalized anxiety disorder Assessment & Plan: She is reporting improvement. Following with psychotherapy monthly and already established with psychiatrist, next appointment in 03/2022. Continue Celexa 10 mg daily and hydralazine 25 mg as needed at bedtime.   Seasonal allergic rhinitis due to pollen Assessment & Plan: Continue Zyrtec 10 mg daily. Flonase nasal spray added today to use daily for 10 to 14 days then as needed.  We discussed some side effects. Saline nasal irrigations as needed throughout the day. Follow-up as needed.  Orders: -     Fluticasone Propionate; Place 1 spray into both nostrils 2 (two) times daily.  Dispense: 16 g; Refill: 3  Tremor, unspecified Noted today during examination. I do not think further workup is needed at this time. Monitor for new symptoms.  Gastroesophageal reflux disease, unspecified whether esophagitis present Assessment & Plan: Could be contributing to some of her symptoms. She is not having heartburn or acid reflux. Continue omeprazole 40 mg daily and GERD precautions.  Return if symptoms worsen or fail to improve, for keep next appointment.  Ashkan Chamberland G. [place], MD  [facility]. [place] office.

## 2022-03-23 ENCOUNTER — Encounter: Payer: Self-pay | Admitting: Family Medicine

## 2022-03-23 ENCOUNTER — Ambulatory Visit (INDEPENDENT_AMBULATORY_CARE_PROVIDER_SITE_OTHER): Payer: 59 | Admitting: Family Medicine

## 2022-03-23 VITALS — BP 118/60 | HR 82 | Temp 98.8°F | Resp 16 | Ht 65.0 in | Wt 194.2 lb

## 2022-03-23 DIAGNOSIS — K219 Gastro-esophageal reflux disease without esophagitis: Secondary | ICD-10-CM

## 2022-03-23 DIAGNOSIS — J301 Allergic rhinitis due to pollen: Secondary | ICD-10-CM

## 2022-03-23 DIAGNOSIS — F411 Generalized anxiety disorder: Secondary | ICD-10-CM

## 2022-03-23 DIAGNOSIS — R0982 Postnasal drip: Secondary | ICD-10-CM | POA: Diagnosis not present

## 2022-03-23 DIAGNOSIS — R251 Tremor, unspecified: Secondary | ICD-10-CM

## 2022-03-23 MED ORDER — FLUTICASONE PROPIONATE 50 MCG/ACT NA SUSP: 1.0000 | Freq: Two times a day (BID) | NASAL | 3 refills | Status: DC

## 2022-03-23 NOTE — Assessment & Plan Note (Signed)
She is reporting improvement. Following with psychotherapy monthly and already established with psychiatrist, next appointment in 03/2022. Continue Celexa 10 mg daily and hydralazine 25 mg as needed at bedtime.

## 2022-03-23 NOTE — Assessment & Plan Note (Signed)
Could be contributing to some of her symptoms. She is not having heartburn or acid reflux. Continue omeprazole 40 mg daily and GERD precautions.

## 2022-03-23 NOTE — Patient Instructions (Addendum)
A few things to remember from today's visit:  Post-nasal drainage - Plan: fluticasone (FLONASE) 50 MCG/ACT nasal spray  Generalized anxiety disorder  Seasonal allergic rhinitis due to pollen - Plan: fluticasone (FLONASE) 50 MCG/ACT nasal spray  Nasal saline irrigations as needed. Flonase nasal spray daily at night or morning for 10-14 days. Continue Zyrtec 10 mg daily.  No changes in rest.  If you need refills for medications you take chronically, please call your pharmacy. Do not use My Chart to request refills or for acute issues that need immediate attention. If you send a my chart message, it may take a few days to be addressed, specially if I am not in the office.  Please be sure medication list is accurate. If a new problem present, please set up appointment sooner than planned today.

## 2022-03-23 NOTE — Assessment & Plan Note (Signed)
Continue Zyrtec 10 mg daily. Flonase nasal spray added today to use daily for 10 to 14 days then as needed.  We discussed some side effects. Saline nasal irrigations as needed throughout the day. Follow-up as needed.

## 2022-04-09 ENCOUNTER — Other Ambulatory Visit (HOSPITAL_COMMUNITY): Payer: Self-pay | Admitting: Family Medicine

## 2022-04-09 DIAGNOSIS — Z1231 Encounter for screening mammogram for malignant neoplasm of breast: Secondary | ICD-10-CM

## 2022-04-17 ENCOUNTER — Ambulatory Visit
Admission: RE | Admit: 2022-04-17 | Discharge: 2022-04-17 | Disposition: A | Discharge status: Home / Self Care | Payer: 59 | Source: Ambulatory Visit | Attending: Family Medicine | Admitting: Family Medicine

## 2022-04-17 DIAGNOSIS — Z1231 Encounter for screening mammogram for malignant neoplasm of breast: Secondary | ICD-10-CM | POA: Diagnosis not present

## 2022-04-18 ENCOUNTER — Ambulatory Visit (HOSPITAL_COMMUNITY): Payer: 59

## 2022-05-03 ENCOUNTER — Encounter: Payer: Self-pay | Admitting: Family

## 2022-05-03 ENCOUNTER — Other Ambulatory Visit: Payer: Self-pay | Admitting: Nurse Practitioner

## 2022-05-03 ENCOUNTER — Ambulatory Visit (INDEPENDENT_AMBULATORY_CARE_PROVIDER_SITE_OTHER): Payer: 59 | Admitting: Family

## 2022-05-03 VITALS — BP 152/66 | HR 65 | Temp 98.0°F | Ht 65.0 in | Wt 200.6 lb

## 2022-05-03 DIAGNOSIS — R059 Cough, unspecified: Secondary | ICD-10-CM

## 2022-05-03 DIAGNOSIS — J069 Acute upper respiratory infection, unspecified: Secondary | ICD-10-CM | POA: Diagnosis not present

## 2022-05-03 LAB — POCT INFLUENZA A/B
Influenza A, POC: NEGATIVE
Influenza B, POC: NEGATIVE

## 2022-05-03 LAB — POCT RAPID STREP A (OFFICE): Rapid Strep A Screen: NEGATIVE

## 2022-05-03 LAB — POC COVID19 BINAXNOW: SARS Coronavirus 2 Ag: NEGATIVE

## 2022-05-03 MED ORDER — PREDNISONE 20 MG PO TABS
ORAL_TABLET | ORAL | 0 refills | Status: DC
Start: 2022-05-03 — End: 2022-09-28

## 2022-05-03 NOTE — Patient Instructions (Signed)
It was very nice to see you today!   I have sent over Prednisone to take for your respiratory symptoms. Start this tomorrow morning and take for 5 days. Use the Breo inhaler, 1 puff daily for the next week, and use your rescue inhaler in the morning and at night for the next few days. Increase your Flonase nasal spray to twice a day for 3-5 days then reduce your dose to daily. Also recommend using a saline nasal spray before the Flonase and as needed. Increase fluids daily, 8 cups of water daily.     PLEASE NOTE:  If you had any lab tests please let us know if you have not heard back within a few days. You may see your results on MyChart before we have a chance to review them but we will give you a call once they are reviewed by [place]. If we ordered any referrals today, please let us know if you have not heard from their office within the next week.

## 2022-05-03 NOTE — Progress Notes (Signed)
Patient ID: Veronica [name], female    DOB: [birth year], 72 y.o.   MRN: [id number]  Chief Complaint  Patient presents with   Sinus Problem    sx for 3d    HPI:      URI sx:     Pt c/o chest congestion, sore throat, coughing with yellow mucus, body aches and sharp back pains, present for 7 days but has worsened in the last 3 days. Has tried Mucinex and aleve which did help sx. Also takes Claritin & Flonase qd, started the Flonase last month.   Assessment & Plan:  1. Cough, unspecified type - rapid testing neg.  - POCT rapid strep A - POC COVID-19 - POCT Influenza A/B  2. Viral upper respiratory tract infection sending prednisone pack, advised on use & SE, continue taking Mucinex, Claritin, and increase Flonase nasal spray to bid for the next week, then reduce to qd use. Also advised using saline nasal spray tid and prior to Flonase spray. Drink plenty of fluids.  - predniSONE (DELTASONE) 20 MG tablet; Take 2 pills in the morning with breakfast for 3 days, then 1 pill for 2 days  Dispense: 8 tablet; Refill: 0  Subjective:    Outpatient Medications Prior to Visit  Medication Sig Dispense Refill   ALPRAZolam (XANAX) 0.25 MG tablet TAKE 1 TABLET (0.25 MG TOTAL) BY MOUTH AT BEDTIME AS NEEDED FOR ANXIETY OR SLEEP. 30 tablet 1   amLODipine (NORVASC) 10 MG tablet TAKE 1 TABLET BY MOUTH EVERY DAY 90 tablet 0   BREO ELLIPTA 200-25 MCG/ACT AEPB TAKE 1 PUFF BY MOUTH EVERY DAY 60 each 11   citalopram (CELEXA) 10 MG tablet Take 1 tablet (10 mg total) by mouth daily. 30 tablet 2   fluticasone (FLONASE) 50 MCG/ACT nasal spray Place 1 spray into both nostrils 2 (two) times daily. 16 g 3   losartan (COZAAR) 50 MG tablet Take 50 mg by mouth daily.     omeprazole (PRILOSEC) 40 MG capsule TAKE 1 CAPSULE (40 MG TOTAL) BY MOUTH DAILY. 60 capsule 0   rosuvastatin (CRESTOR) 20 MG tablet TAKE 1 TABLET BY MOUTH EVERY DAY 90 tablet 3   traZODone (DESYREL) 50 MG tablet Take 0.5-1 tablets (25-50 mg total) by  mouth at bedtime as needed for sleep. 30 tablet 1   No facility-administered medications prior to visit.   Past Medical History:  Diagnosis Date   Allergic rhinitis    Anxiety and depression    Asthma    Constipation, chronic    COPD (chronic obstructive pulmonary disease)    Coronary artery calcification seen on CAT scan 06/2010   Mild nonobstructive CAD on cardiac CT  -as of 2021, coronary calcium score 34   Dyspnea    Hemorrhoids    Hip pain, bilateral    Hyperlipidemia    Hypertension    Leukoplakia of vagina    Low back pain    w/ left side radiculopathy, s/p injection at L4-L5 for[place] 1/06   Uterine fibroid    Past Surgical History:  Procedure Laterality Date   ABDOMINAL HYSTERECTOMY  11-2000   no oophorectomy per pt   CHOLECYSTECTOMY  12/08/2010   Procedure: LAPAROSCOPIC CHOLECYSTECTOMY WITH INTRAOPERATIVE CHOLANGIOGRAM;  Surgeon: [name], MD;  Location: [facility];  Service: General;  Laterality: N/A;  c-arm    COLONOSCOPY WITH PROPOFOL N/A 02/02/2014   Procedure: COLONOSCOPY WITH PROPOFOL;  Surgeon: Charolett Bumpers, MD;  Location: [facility];  Service: Endoscopy;  Laterality: N/A;  CORONARY CALCIUM SCORE  12/2018   Coronary calcium score-34   CORONARY CT ANGIOGRAM  06/2010    Coronary calcium score 12.  Mild nonobstructive disease.  Ostial LAD and first D1/RI.  Right dominant.  Focal scarring and bronchiectasis of medial right lower lobe.   CYST EXCISION Left 08/08/2021   Procedure: EXCISION LEFT UPPER BACK SEBACEOUS CYST X2;  Surgeon: Abigail Miyamoto, MD;  Location: [facility];  Service: General;  Laterality: Left;   LEEP     unknown date    NM MYOVIEW LTD  10/2010   Nonischemic.  Normal EF.   TUBAL LIGATION     bilateral 1989   UTERINE FIBROID SURGERY     Allergies  Allergen Reactions   No [facility] Food Allergies Other (See Comments)    Cheese-elevated blood pressure   Peanut-Containing Drug Products Other (See Comments)    headache       Objective:    Physical Exam Vitals and nursing note reviewed.  Constitutional:      Appearance: Normal appearance. She is not ill-appearing.     Interventions: Face mask in place.  HENT:     Right Ear: Tympanic membrane and ear canal normal.     Left Ear: Tympanic membrane and ear canal normal.     Nose:     Right Sinus: No frontal sinus tenderness.     Left Sinus: No frontal sinus tenderness.     Mouth/Throat:     Mouth: Mucous membranes are moist.     Pharynx: Posterior oropharyngeal erythema present. No pharyngeal swelling, oropharyngeal exudate or uvula swelling.     Tonsils: No tonsillar exudate or tonsillar abscesses.  Cardiovascular:     Rate and Rhythm: Normal rate and regular rhythm.  Pulmonary:     Effort: Pulmonary effort is normal.     Breath sounds: Normal breath sounds.  Musculoskeletal:        General: Normal range of motion.  Lymphadenopathy:     Head:     Right side of head: No preauricular or posterior auricular adenopathy.     Left side of head: No preauricular or posterior auricular adenopathy.     Cervical: No cervical adenopathy.  Skin:    General: Skin is warm and dry.  Neurological:     Mental Status: She is alert.  Psychiatric:        Mood and Affect: Mood normal.        Behavior: Behavior normal.    BP (!) 152/66 (BP Location: Left Arm, Patient Position: Sitting, Cuff Size: Large)   Pulse 65   Temp 98 F (36.7 C) (Temporal)   Ht 5\' 5"  (1.651 m)   Wt 200 lb 9.6 oz (91 kg)   SpO2 94%   BMI 33.38 kg/m  Wt Readings from Last 3 Encounters:  05/03/22 200 lb 9.6 oz (91 kg)  03/23/22 194 lb 4 oz (88.1 kg)  02/07/22 199 lb 6 oz (90.4 kg)      Dulce Sellar, NP

## 2022-05-07 NOTE — Progress Notes (Signed)
ACUTE VISIT Chief Complaint  Patient presents with   Allergies    Runny nose, nasal congestion.    HPI: Veronica [name] is a 72 y.o. female with PMHx significant [facility]LD,asthma,and anxiety here today with concerns regarding allergies. 14 days of nasal congestion and rhinorrhea particularly exacerbated by pollen exposure. She reports immunotherapy treatment in the [address]1980s in [place]. She notes a recurrence of symptoms, especially severe in the current season, similar to the previous year.  She describes experiencing a significant amount of green rhinorrhea and postnasal drainage, severe nasal congestion, morning coughs with greenish sputum, throat itchiness leading to coughing, and rib/costal pain due to excessive coughing.  Negative for associated   Evaluated for viral URI on 05/03/22,some symptoms have improved. She was prescribed Prednisone and Breo 200-5 mcg 1 puff daily. She continues experiencing intermittent wheezing. Negative for CP or SOB.  She is on Zyrtec D and Mucinex with decongestant.  She has seen pulmonologist for asthma and immunologist. She would like to establish with new immunologist.  Negative rapid flu,COVID 19 test,and rapid strep. She has not had fever,chills,or body aches.  BP was elevated on 05/03/22 at 152/66. She is on Amlodipine 5 mg daily and Losartan 50 mg daily. Negative for severe/frequent headache, visual changes, palpitation, focal weakness, or edema. Lab Results  Component Value Date   CREATININE 0.76 01/15/2022   BUN 12 01/15/2022   NA 137 01/15/2022   K 4.0 01/15/2022   CL 104 01/15/2022   CO2 23 01/15/2022   Review of Systems  Constitutional:  Positive for fatigue. Negative for chills and fever.  HENT:  Negative for mouth sores and nosebleeds.   Gastrointestinal:  Negative for abdominal pain, nausea and vomiting.  Endocrine: Negative for cold intolerance and heat intolerance.  Genitourinary:  Negative for decreased urine volume and  hematuria.  Skin:  Negative for rash.  Allergic/Immunologic: Positive for environmental allergies.  Neurological:  Negative for syncope and facial asymmetry.  Psychiatric/Behavioral:  Negative for confusion. The patient is nervous/anxious.   See other pertinent positives and negatives in HPI.  Current Outpatient Medications on File Prior to Visit  Medication Sig Dispense Refill   ALPRAZolam (XANAX) 0.25 MG tablet TAKE 1 TABLET (0.25 MG TOTAL) BY MOUTH AT BEDTIME AS NEEDED FOR ANXIETY OR SLEEP. 30 tablet 1   amLODipine (NORVASC) 10 MG tablet TAKE 1 TABLET BY MOUTH EVERY DAY 90 tablet 0   BREO ELLIPTA 200-25 MCG/ACT AEPB TAKE 1 PUFF BY MOUTH EVERY DAY 60 each 11   citalopram (CELEXA) 10 MG tablet Take 1 tablet (10 mg total) by mouth daily. 30 tablet 2   fluticasone (FLONASE) 50 MCG/ACT nasal spray Place 1 spray into both nostrils 2 (two) times daily. 16 g 3   losartan (COZAAR) 50 MG tablet Take 50 mg by mouth daily.     omeprazole (PRILOSEC) 40 MG capsule TAKE 1 CAPSULE (40 MG TOTAL) BY MOUTH DAILY. 60 capsule 0   predniSONE (DELTASONE) 20 MG tablet Take 2 pills in the morning with breakfast for 3 days, then 1 pill for 2 days 8 tablet 0   rosuvastatin (CRESTOR) 20 MG tablet TAKE 1 TABLET BY MOUTH EVERY DAY 90 tablet 3   No current facility-administered medications on file prior to visit.    Past Medical History:  Diagnosis Date   Allergic rhinitis    Anxiety and depression    Asthma    Constipation, chronic    COPD (chronic obstructive pulmonary disease)    Coronary artery  calcification seen on CAT scan 06/2010   Mild nonobstructive CAD on cardiac CT  -as of 2021, coronary calcium score 34   Dyspnea    Hemorrhoids    Hip pain, bilateral    Hyperlipidemia    Hypertension    Leukoplakia of vagina    Low back pain    w/ left side radiculopathy, s/p injection at L4-L5 forarmen 1/06   Uterine fibroid    Allergies  Allergen Reactions   No [facility] Food Allergies Other (See  Comments)    Cheese-elevated blood pressure   Peanut-Containing Drug Products Other (See Comments)    headache    Social History   Socioeconomic History   Marital status: Divorced    Spouse name: Not on file   Number of children: 3   Years of education: Not on file   Highest education level: Not on file  Occupational History   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   Social Determinants of Health   Financial Resource Strain: Low Risk  (09/18/2021)   Overall Financial Resource Strain (CARDIA)    Difficulty of Paying Living Expenses: Not hard at all  Food Insecurity: No Food Insecurity (09/18/2021)   Hunger Vital Sign    Worried About Running Out of Food in the Last Year: Never true    Ran Out of Food in the Last Year: Never true  Transportation Needs: No Transportation Needs (09/18/2021)   PRAPARE - Administrator, Civil Service (Medical): No    Lack of Transportation (Non-Medical): No  Physical Activity: Sufficiently Active (09/18/2021)   Exercise Vital Sign    Days of Exercise per Week: 5 days    Minutes of Exercise per Session: 50 min  Stress: No Stress Concern Present (09/18/2021)   [employer] of Occupational Health - Occupational Stress Questionnaire    Feeling of Stress : Not at all  Social Connections: Moderately Integrated (09/18/2021)   Social Connection and Isolation Panel [NHANES]    Frequency of Communication with  Friends and Family: More than three times a week    Frequency of Social Gatherings with Friends and Family: More than three times a week    Attends Religious Services: More than 4 times per year    Active Member of Clubs or Organizations: Yes    Attends Banker Meetings: More than 4 times per year    Marital Status: Divorced   Vitals:   05/08/22 1405  BP: 138/80  Pulse: 76  Resp: 16  Temp: 98.8 F (37.1 C)  SpO2: 97%   Body mass index is 32.47 kg/m.  Physical Exam Vitals and nursing note reviewed.  Constitutional:      General: She is not in acute distress.    Appearance: She is well-developed.  HENT:     Head: Normocephalic and atraumatic.     Right Ear: Tympanic  membrane, ear canal and external ear normal.     Left Ear: Tympanic membrane, ear canal and external ear normal.     Nose: Congestion and rhinorrhea present.     Right Turbinates: Enlarged.     Left Turbinates: Enlarged.     Mouth/Throat:     Mouth: Mucous membranes are moist.     Pharynx: Oropharynx is clear.  Eyes:     Conjunctiva/sclera: Conjunctivae normal.  Cardiovascular:     Rate and Rhythm: Normal rate and regular rhythm.     Pulses:          Dorsalis pedis pulses are 2+ on the right side and 2+ on the left side.     Heart sounds: No murmur heard. Pulmonary:     Effort: Pulmonary effort is normal. No respiratory distress.     Breath sounds: Normal breath sounds.  Abdominal:     Palpations: Abdomen is soft. There is no hepatomegaly or mass.     Tenderness: There is no abdominal tenderness.  Lymphadenopathy:     Cervical: No cervical adenopathy.  Skin:    General: Skin is warm.     Findings: No erythema or rash.  Neurological:     General: No focal deficit present.     Mental Status: She is alert and oriented to person, place, and time.     Cranial Nerves: No cranial nerve deficit.     Gait: Gait normal.  Psychiatric:        Mood and Affect: Affect normal. Mood is anxious.    ASSESSMENT AND PLAN:  Ms. Lonna [name] was seen today for seasonal allergies and cough.  Asthma, intermittent, uncomplicated Assessment & Plan: Reporting  intermittent wheezing, none appreciate today. Continue Breo 200-5 mcg 1 puff daily. Singulair 10 mg added today at night. Albuterol inh 2 puff every 6 hours for a week then as needed for wheezing or shortness of breath.  Appt with immunologist will be arranged. Instructed about warning signs.  Orders: -     Montelukast Sodium; Take 1 tablet (10 mg total) by mouth at bedtime.  Dispense: 90 tablet; Refill: 0 -     Ambulatory referral to Immunology  Seasonal allergic rhinitis due to pollen Assessment & Plan: Problem is not well controlled. Recommend continuing Zyrtec but changing to plan 10 mg daily in the morning. Singulair 10 mg added at night. Flonase nasal spray at night for 10-14 days then as needed. Nasal saline irrigations as needed. Appt with immunologist will be arranged to discuss possibility for immunotherapy.  Orders: -     Montelukast Sodium; Take 1 tablet (10 mg total) by mouth at bedtime.  Dispense: 90 tablet; Refill: 0 -     Ambulatory referral to Immunology  Essential hypertension Assessment & Plan: BP today is adequate for her. Avoid OTC cold meds with decongestantsReporting elevated BPs at home. Continue amlodipine 10 mg daily and Losartan 50 mg daily as well as low-salt diet. Continue monitoring BP regularly. F/U in 3 months, before if needed.   Cough, unspecified type Explained that cough and congestion can last a few days and even weeks after acute URI symptoms resolved. Plain Mucinex recommended as well as adequate hydration. CXR ordered today. Monitor for new symptoms.  -     DG Chest 2 View; Future  Return in about 3 months (around 08/07/2022) for CPE.  Aram Domzalski G. Swaz[place]Jordan, MD  [facility]. [place] office.

## 2022-05-08 ENCOUNTER — Encounter: Payer: Self-pay | Admitting: Family Medicine

## 2022-05-08 ENCOUNTER — Ambulatory Visit (INDEPENDENT_AMBULATORY_CARE_PROVIDER_SITE_OTHER): Payer: 59 | Admitting: Family Medicine

## 2022-05-08 ENCOUNTER — Ambulatory Visit (INDEPENDENT_AMBULATORY_CARE_PROVIDER_SITE_OTHER): Payer: 59

## 2022-05-08 VITALS — BP 138/80 | HR 76 | Temp 98.8°F | Resp 16 | Ht 65.0 in | Wt 195.1 lb

## 2022-05-08 DIAGNOSIS — J301 Allergic rhinitis due to pollen: Secondary | ICD-10-CM | POA: Diagnosis not present

## 2022-05-08 DIAGNOSIS — R059 Cough, unspecified: Secondary | ICD-10-CM | POA: Diagnosis not present

## 2022-05-08 DIAGNOSIS — I10 Essential (primary) hypertension: Secondary | ICD-10-CM | POA: Diagnosis not present

## 2022-05-08 DIAGNOSIS — J4520 Mild intermittent asthma, uncomplicated: Secondary | ICD-10-CM | POA: Diagnosis not present

## 2022-05-08 MED ORDER — MONTELUKAST SODIUM 10 MG PO TABS
10.0000 mg | ORAL_TABLET | Freq: Every day | ORAL | 0 refills | Status: DC
Start: 2022-05-08 — End: 2022-08-01

## 2022-05-08 NOTE — Patient Instructions (Addendum)
A few things to remember from today's visit:  Asthma, intermittent, uncomplicated - Plan: montelukast (SINGULAIR) 10 MG tablet, Ambulatory referral to Immunology  Seasonal allergic rhinitis due to pollen - Plan: montelukast (SINGULAIR) 10 MG tablet, Ambulatory referral to Immunology  Essential hypertension  Cough, unspecified type - Plan: DG Chest 2 View Singulair 10 mg en la noche. Zyrtec 10 mg, plain, en la manana. Continue Breo 1 puff daily. Albuterol inh 2 puff every 4-6 hours as needed. Plain Mucinex daily. Flonase nasal spray at night for 10-14 days then as needed. Nasal saline irrigations as needed.  If you need refills for medications you take chronically, please call your pharmacy. Do not use My Chart to request refills or for acute issues that need immediate attention. If you send a my chart message, it may take a few days to be addressed, specially if I am not in the office.  Please be sure medication list is accurate. If a new problem present, please set up appointment sooner than planned today.

## 2022-05-12 NOTE — Assessment & Plan Note (Signed)
Reporting  intermittent wheezing, none appreciate today. Continue Breo 200-5 mcg 1 puff daily. Singulair 10 mg added today at night. Albuterol inh 2 puff every 6 hours for a week then as needed for wheezing or shortness of breath.  Appt with immunologist will be arranged. Instructed about warning signs.

## 2022-05-12 NOTE — Assessment & Plan Note (Signed)
BP today is adequate for her. Avoid OTC cold meds with decongestantsReporting elevated BPs at home. Continue amlodipine 10 mg daily and Losartan 50 mg daily as well as low-salt diet. Continue monitoring BP regularly. F/U in 3 months, before if needed.

## 2022-05-12 NOTE — Assessment & Plan Note (Signed)
Problem is not well controlled. Recommend continuing Zyrtec but changing to plan 10 mg daily in the morning. Singulair 10 mg added at night. Flonase nasal spray at night for 10-14 days then as needed. Nasal saline irrigations as needed. Appt with immunologist will be arranged to discuss possibility for immunotherapy.

## 2022-06-19 ENCOUNTER — Other Ambulatory Visit: Payer: Self-pay

## 2022-06-19 ENCOUNTER — Encounter: Payer: Self-pay | Admitting: Internal Medicine

## 2022-06-19 ENCOUNTER — Ambulatory Visit (INDEPENDENT_AMBULATORY_CARE_PROVIDER_SITE_OTHER): Payer: 59 | Admitting: Internal Medicine

## 2022-06-19 VITALS — BP 122/78 | HR 64 | Temp 98.1°F | Ht 65.0 in | Wt 200.3 lb

## 2022-06-19 DIAGNOSIS — J3089 Other allergic rhinitis: Secondary | ICD-10-CM | POA: Diagnosis not present

## 2022-06-19 DIAGNOSIS — J4520 Mild intermittent asthma, uncomplicated: Secondary | ICD-10-CM | POA: Diagnosis not present

## 2022-06-19 MED ORDER — FLUTICASONE FUROATE-VILANTEROL 200-25 MCG/ACT IN AEPB: INHALATION_SPRAY | RESPIRATORY_TRACT | 1 refills | Status: DC

## 2022-06-19 MED ORDER — FLUTICASONE PROPIONATE 50 MCG/ACT NA SUSP: 2.0000 | Freq: Every day | NASAL | 5 refills | Status: DC

## 2022-06-19 MED ORDER — ALBUTEROL SULFATE HFA 108 (90 BASE) MCG/ACT IN AERS: 2.0000 | INHALATION_SPRAY | Freq: Four times a day (QID) | RESPIRATORY_TRACT | 1 refills | Status: DC | PRN

## 2022-06-19 NOTE — Patient Instructions (Addendum)
Mild Intermittent Asthma: - With respiratory illness or asthma flare up, start Breo 200-52mcg 1 puff daily for 1-2 weeks.   - Rescue inhaler: Albuterol 2 puffs via spacer or 1 vial via nebulizer every 4-6 hours as needed for respiratory symptoms of cough, shortness of breath, or wheezing Asthma control goals:  Full participation in all desired activities (may need albuterol before activity) Albuterol use two times or less a week on average (not counting use with activity) Cough interfering with sleep two times or less a month Oral steroids no more than once a year No hospitalizations   Chronic Rhinitis:   - Use nasal saline rinses before nose sprays such as with Neilmed Sinus Rinse.  Use distilled water.   - Use Flonase 2 sprays each nostril daily. Aim upward and outward. - Hold all anti histamines (Zyrtec, Benadryl, Claritin, Allegra) 3 days prior to next visit.    Overbook with me on 5/24 for skin testing at 8:30 AM

## 2022-06-19 NOTE — Progress Notes (Signed)
NEW PATIENT  Date of Service/Encounter:  06/19/22  Consult requested by: [place], Betty G, MD   Subjective:   Veronica [name] (DOB: 1950-06-03) is a 72 y.o. female who presents to the clinic on 06/19/2022 with a chief complaint of Asthma, Allergies, and Establish Care .    History obtained from: chart review and patient.   Asthma:  Diagnosed around age 16.    She does okay overall but does flare up with illness or strong scents.   Only with flare up in April for about 1 week daytime symptoms in past month, few times nighttime awakenings in past month Using rescue inhaler: can't recall how often, last use was with illness  Limitations to daily activity: none 0 ED visits/UC visits and 1 oral steroids in the past year with PCP  0 number of lifetime hospitalizations, 0 number of lifetime intubations.  Identified Triggers: respiratory illness Prior PFTs or spirometry: none available for review  Current regimen:  Maintenance: Breo 200-25 1 puff daily only with illness.   Rescue: Albuterol 2 puffs q4-6 hrs PRN  Rhinitis:  Started in late 20s.  Has lived in [place] and [place].   This past 2 years, they have been uncontrolled.    Symptoms include: nasal congestion, rhinorrhea, post nasal drainage, and watery eyes  Occurs seasonally-Spring/Summer  Potential triggers: pollen  Treatments tried:  AIT in [place]  Zyrtec 10mg  daily; last use was 3 days ago Previously used Flonase   Previous allergy testing: yes pollen, cat, dog but can't recall exact results as they were years ago  History of reflux/heartburn: yes followed by GI and on omeprazole 40mg  daily.  History of sinus surgery: no Nonallergic triggers: none   Past Medical History: Past Medical History:  Diagnosis Date   Allergic rhinitis    Anxiety and depression    Asthma    Constipation, chronic    COPD (chronic obstructive pulmonary disease) (HCC)    Coronary artery calcification seen on CAT scan 06/2010   Mild  nonobstructive CAD on cardiac CT  -as of 2021, coronary calcium score 34   Dyspnea    Hemorrhoids    Hip pain, bilateral    Hyperlipidemia    Hypertension    Leukoplakia of vagina    Low back pain    w/ left side radiculopathy, s/p injection at L4-L5 forarmen 1/06   Uterine fibroid     Past Surgical History: Past Surgical History:  Procedure Laterality Date   ABDOMINAL HYSTERECTOMY  11-2000   no oophorectomy per pt   CHOLECYSTECTOMY  12/08/2010   Procedure: LAPAROSCOPIC CHOLECYSTECTOMY WITH INTRAOPERATIVE CHOLANGIOGRAM;  Surgeon: [name], MD;  Location: [facility];  Service: General;  Laterality: N/A;  c-arm    COLONOSCOPY WITH PROPOFOL N/A 02/02/2014   Procedure: COLONOSCOPY WITH PROPOFOL;  Surgeon: Charolett Bumpers, MD;  Location: [facility];  Service: Endoscopy;  Laterality: N/A;   CORONARY CALCIUM SCORE  12/2018   Coronary calcium score-34   CORONARY CT ANGIOGRAM  06/2010    Coronary calcium score 12.  Mild nonobstructive disease.  Ostial LAD and first D1/RI.  Right dominant.  Focal scarring and bronchiectasis of medial right lower lobe.   CYST EXCISION Left 08/08/2021   Procedure: EXCISION LEFT UPPER BACK SEBACEOUS CYST X2;  Surgeon: Abigail Miyamoto, MD;  Location: [facility];  Service: General;  Laterality: Left;   LEEP     unknown date    NM MYOVIEW LTD  10/2010   Nonischemic.  Normal EF.  TUBAL LIGATION     bilateral 1989   UTERINE FIBROID SURGERY      Family History: Family History  Problem Relation Age of Onset   Hypertension Mother    Hyperlipidemia Mother    Heart attack Mother 33   Heart disease Mother    Anxiety disorder Mother    Hypertension Father    Hyperlipidemia Father    AAA (abdominal aortic aneurysm) Father 15   Heart disease Father    Allergic rhinitis Sister    Allergic rhinitis Brother    Schizophrenia Brother    CVA Brother    Valvular heart disease Brother        Valve Surgery   Allergic rhinitis Brother    Heart disease Brother         Vavle surgery   Esophageal cancer Maternal Grandmother    Schizophrenia Son    Schizophrenia Other        2 nieces   Diabetes Neg Hx    Colon cancer Neg Hx    Breast cancer Neg Hx     Social History:  Lives in a 1970 year townhouse Flooring in bedroom: laminate Pets: dog Tobacco use/exposure: none Job: retired   Medication List:  Allergies as of 06/19/2022       Reactions   No [facility] Food Allergies Other (See Comments)   Cheese-elevated blood pressure   Peanut-containing Drug Products Other (See Comments)   headache        Medication List        Accurate as of Jun 19, 2022  2:14 PM. If you have any questions, ask your nurse or doctor.          ALPRAZolam 0.25 MG tablet Commonly known as: XANAX TAKE 1 TABLET (0.25 MG TOTAL) BY MOUTH AT BEDTIME AS NEEDED FOR ANXIETY OR SLEEP.   amLODipine 10 MG tablet Commonly known as: NORVASC TAKE 1 TABLET BY MOUTH EVERY DAY   Breo Ellipta 200-25 MCG/ACT Aepb Generic drug: fluticasone furoate-vilanterol TAKE 1 PUFF BY MOUTH EVERY DAY   citalopram 10 MG tablet Commonly known as: CELEXA Take 1 tablet (10 mg total) by mouth daily.   fluticasone 50 MCG/ACT nasal spray Commonly known as: FLONASE Place 1 spray into both nostrils 2 (two) times daily.   hydrOXYzine 25 MG capsule Commonly known as: VISTARIL Take 25 mg by mouth at bedtime as needed.   losartan 50 MG tablet Commonly known as: COZAAR Take 50 mg by mouth daily.   montelukast 10 MG tablet Commonly known as: SINGULAIR Take 1 tablet (10 mg total) by mouth at bedtime.   omeprazole 40 MG capsule Commonly known as: PRILOSEC TAKE 1 CAPSULE (40 MG TOTAL) BY MOUTH DAILY.   predniSONE 20 MG tablet Commonly known as: DELTASONE Take 2 pills in the morning with breakfast for 3 days, then 1 pill for 2 days   rosuvastatin 20 MG tablet Commonly known as: CRESTOR TAKE 1 TABLET BY MOUTH EVERY DAY         REVIEW OF SYSTEMS: Pertinent positives and  negatives discussed in HPI.   Objective:   Physical Exam: BP 122/78   Pulse 64   Temp 98.1 F (36.7 C) (Temporal)   Ht 5\' 5"  (1.651 m)   Wt 200 lb 4.8 oz (90.9 kg)   SpO2 97%   BMI 33.33 kg/m  Body mass index is 33.33 kg/m. GEN: alert, well developed HEENT: clear conjunctiva, TM grey and translucent, nose with + inferior turbinate hypertrophy, pink nasal mucosa,  slight clear rhinorrhea, + cobblestoning HEART: regular rate and rhythm, no murmur LUNGS: clear to auscultation bilaterally, no coughing, unlabored respiration ABDOMEN: soft, non distended  SKIN: no rashes or lesions  Reviewed:  05/08/2022: seen by Dr. [place] for nasal congestion and rhinorrhea for over 14 days.  Previously did AIT in 30s in [place].  More severe symptoms this current season.  She also has asthma and is on Breo 200-92mcg 1 puff daily, Singulair 10mg  daily and PRN albuterol.  Also started on Flonase and Zyrtec.  10/06/2021: saw Dr. Marina Goodell GI with epigastric pain and dysphagia.  Also reported she has trouble with SOB/anxiety.  Started on Omeprazole 40mg  daily and plan to do EGD for chronic GERD.  09/12/2021: saw Allergy Dr. Sanborn Callas for cough and other allergic rhinitis.  Negative SPT.  Discussed saline irrigation and PRN albuterol.  Spirometry:  Tracings reviewed. Her effort: Good reproducible efforts. FVC: 3.18L FEV1: 2.43L, 114% predicted FEV1/FVC ratio: 76% Interpretation: Spirometry consistent with normal pattern.  Please see scanned spirometry results for details.   Assessment:   1. Mild intermittent asthma without complication   2. Other allergic rhinitis     Plan/Recommendations:  Mild Intermittent Asthma: - With respiratory illness or asthma flare up, start Breo 200-60mcg 1 puff daily for 1-2 weeks.   - Rescue inhaler: Albuterol 2 puffs via spacer or 1 vial via nebulizer every 4-6 hours as needed for respiratory symptoms of cough, shortness of breath, or wheezing Asthma control goals:  Full  participation in all desired activities (may need albuterol before activity) Albuterol use two times or less a week on average (not counting use with activity) Cough interfering with sleep two times or less a month Oral steroids no more than once a year No hospitalizations   Chronic Rhinitis: - Due to turbinate hypertrophy, seasonal symptoms and unresponsive to OTC meds, performed skin testing to identify aeroallergen triggers.   - Use nasal saline rinses before nose sprays such as with Neilmed Sinus Rinse.  Use distilled water.   - Use Flonase 2 sprays each nostril daily. Aim upward and outward. - Hold all anti histamines (Zyrtec, Benadryl, Claritin, Allegra) 3 days prior to next visit.      Return in about 3 days (around 06/22/2022).  Alesia Morin, MD [facility]

## 2022-06-22 ENCOUNTER — Encounter: Payer: Self-pay | Admitting: Internal Medicine

## 2022-06-22 ENCOUNTER — Ambulatory Visit (INDEPENDENT_AMBULATORY_CARE_PROVIDER_SITE_OTHER): Payer: 59 | Admitting: Internal Medicine

## 2022-06-22 VITALS — BP 132/70 | HR 60 | Temp 98.0°F

## 2022-06-22 DIAGNOSIS — J3089 Other allergic rhinitis: Secondary | ICD-10-CM | POA: Diagnosis not present

## 2022-06-22 NOTE — Patient Instructions (Addendum)
Mild Intermittent Asthma: - With respiratory illness or asthma flare up, start Breo 200-54mcg 1 puff daily for 1-2 weeks.   - Rescue inhaler: Albuterol 2 puffs via spacer or 1 vial via nebulizer every 4-6 hours as needed for respiratory symptoms of cough, shortness of breath, or wheezing Asthma control goals:  Full participation in all desired activities (may need albuterol before activity) Albuterol use two times or less a week on average (not counting use with activity) Cough interfering with sleep two times or less a month Oral steroids no more than once a year No hospitalizations   Chronic Rhinitis:   - Use nasal saline rinses before nose sprays such as with Neilmed Sinus Rinse.  Use distilled water.   - Use Flonase 2 sprays each nostril daily. Aim upward and outward. - Use Zyrtec 10mg  daily. - Will obtain blood testing since skin testing did not react.

## 2022-06-22 NOTE — Progress Notes (Signed)
FOLLOW UP Date of Service/Encounter:  06/22/22   Subjective:  Veronica [name] (DOB: August 06, 1950) is a 72 y.o. female who [facility] for follow up for skin testing.   History obtained from: chart review and patient. Doing well, held anti histamines. We confirmed this multiple times.   Past Medical History: Past Medical History:  Diagnosis Date   Allergic rhinitis    Anxiety and depression    Asthma    Constipation, chronic    COPD (chronic obstructive pulmonary disease) (HCC)    Coronary artery calcification seen on CAT scan 06/2010   Mild nonobstructive CAD on cardiac CT  -as of 2021, coronary calcium score 34   Dyspnea    Hemorrhoids    Hip pain, bilateral    Hyperlipidemia    Hypertension    Leukoplakia of vagina    Low back pain    w/ left side radiculopathy, s/p injection at L4-L5 forarmen 1/06   Uterine fibroid     Objective:  BP 132/70   Pulse 60   Temp 98 F (36.7 C) (Temporal)   SpO2 98%  There is no height or weight on file to calculate BMI. Physical Exam: GEN: alert, well developed HEENT: clear conjunctiva, MMM HEART: regular rate  LUNGS:  no coughing, unlabored respiration SKIN: no rashes or lesions   Skin Testing:  Skin prick testing was placed, which includes aeroallergens/foods, histamine control, and saline control.  Verbal consent was obtained prior to placing test.  Patient tolerated procedure well.  Allergy testing results were read and interpreted by myself, documented by clinical staff. Adequate positive and negative control.  Positive results to:  Results discussed with patient/family.  Airborne Adult Perc - 06/22/22 0800     Time Antigen Placed 1610    Allergen Manufacturer Waynette Buttery    Location Back    Number of Test 55    1. Control-Buffer 50% Glycerol Negative    2. Control-Histamine Negative    3. Bahia Negative    4. [place] Negative    5. Johnson Negative    6. Kentucky Blue Negative     7. Meadow Fescue Negative    8. Perennial Rye Negative    9. Timothy Negative    10. Ragweed Mix Negative    11. Cocklebur Negative    12. Plantain,  English Negative    13. Baccharis Negative    14. Dog Fennel Negative    15. Russian Thistle Negative    16. Lamb's Quarters Negative    17. Sheep Sorrell Negative    18. Rough Pigweed Negative    19. Marsh Elder, Rough Negative    20. Mugwort, Common Negative    21. Box, Elder Negative    22. Cedar, red Negative    23. Sweet Gum Negative    24. Pecan Pollen Negative    25. Pine Mix Negative    26. Walnut, Black Pollen Negative    27. Red Mulberry Negative    28. Ash Mix Negative    29. Birch Mix Negative    30. Beech American Negative    31. Cottonwood, [place] Negative    32. Hickory, White Negative    33. Maple Mix Negative    34. Oak, [place] Mix Negative    35. Sycamore Eastern Negative    36. Alternaria Alternata Negative    37. Cladosporium Herbarum Negative    38. Aspergillus Mix Negative    39. Penicillium Mix Negative  40. Bipolaris Sorokiniana (Helminthosporium) Negative    41. Drechslera Spicifera (Curvularia) Negative    42. Mucor Plumbeus Negative    43. Fusarium Moniliforme Negative    44. Aureobasidium Pullulans (pullulara) Negative    45. Rhizopus Oryzae Negative    46. Botrytis Cinera Negative    47. Epicoccum Nigrum Negative    48. Phoma Betae Negative    49. Dust Mite Mix Negative    50. Cat Hair 10,000 BAU/ml Negative    51.  Dog Epithelia Negative    52. Mixed Feathers Negative    53. Horse Epithelia Negative    54. Cockroach, German Negative    55. Tobacco Leaf Negative              Assessment:   1. Other allergic rhinitis     Plan/Recommendations:  Mild Intermittent Asthma: - With respiratory illness or asthma flare up, start Breo 200-94mcg 1 puff daily for 1-2 weeks.   - Rescue inhaler: Albuterol 2 puffs via spacer or 1 vial via nebulizer every 4-6 hours as needed for  respiratory symptoms of cough, shortness of breath, or wheezing Asthma control goals:  Full participation in all desired activities (may need albuterol before activity) Albuterol use two times or less a week on average (not counting use with activity) Cough interfering with sleep two times or less a month Oral steroids no more than once a year No hospitalizations   Other Allergic Rhinitis:   - Due to turbinate hypertrophy, seasonal symptoms and unresponsive to OTC meds, performed skin testing to identify aeroallergen triggers. However, skin testing did not have a reactive histamine so we decided to do blood testing.  We did confirm she had held her anti histamines.  - Use nasal saline rinses before nose sprays such as with Neilmed Sinus Rinse.  Use distilled water.   - Use Flonase 2 sprays each nostril daily. Aim upward and outward. - Use Zyrtec 10mg  daily.  Return in about 6 weeks (around 08/03/2022).  Alesia Morin, MD [facility]

## 2022-06-26 LAB — ALLERGENS W/TOTAL IGE AREA 2
Alternaria Alternata IgE: <0.1 kU/L
Aspergillus Fumigatus IgE: <0.1 kU/L
Bermuda Grass IgE: <0.1 kU/L
Cat Dander IgE: <0.1 kU/L
Cedar, Mountain IgE: <0.1 kU/L
Cladosporium Herbarum IgE: <0.1 kU/L
Cockroach, German IgE: <0.1 kU/L
Common Silver Birch IgE: 0.3 kU/L — AB
Cottonwood IgE: <0.1 kU/L
D Farinae IgE: <0.1 kU/L
D Pteronyssinus IgE: <0.1 kU/L
Dog Dander IgE: <0.1 kU/L
Elm, American IgE: 0.17 kU/L — AB
IgE (Immunoglobulin E), Serum: 7 IU/mL (ref 6–495)
Johnson Grass IgE: <0.1 kU/L
Maple/Box Elder IgE: <0.1 kU/L
Mouse Urine IgE: <0.1 kU/L
Oak, White IgE: <0.1 kU/L
Pecan, Hickory IgE: <0.1 kU/L
Penicillium Chrysogen IgE: <0.1 kU/L
Pigweed, Rough IgE: <0.1 kU/L
Ragweed, Short IgE: <0.1 kU/L
Sheep Sorrel IgE Qn: <0.1 kU/L
Timothy Grass IgE: <0.1 kU/L
White Mulberry IgE: <0.1 kU/L

## 2022-07-02 ENCOUNTER — Encounter: Payer: Self-pay | Admitting: Internal Medicine

## 2022-07-06 ENCOUNTER — Ambulatory Visit: Payer: 59 | Attending: Nurse Practitioner | Admitting: Nurse Practitioner

## 2022-07-06 ENCOUNTER — Encounter: Payer: Self-pay | Admitting: Nurse Practitioner

## 2022-07-06 VITALS — BP 130/62 | HR 64 | Ht 65.0 in | Wt 202.8 lb

## 2022-07-06 DIAGNOSIS — E785 Hyperlipidemia, unspecified: Secondary | ICD-10-CM | POA: Diagnosis not present

## 2022-07-06 DIAGNOSIS — R002 Palpitations: Secondary | ICD-10-CM

## 2022-07-06 DIAGNOSIS — I2510 Atherosclerotic heart disease of native coronary artery without angina pectoris: Secondary | ICD-10-CM

## 2022-07-06 DIAGNOSIS — I10 Essential (primary) hypertension: Secondary | ICD-10-CM | POA: Diagnosis not present

## 2022-07-06 DIAGNOSIS — R42 Dizziness and giddiness: Secondary | ICD-10-CM | POA: Diagnosis not present

## 2022-07-06 DIAGNOSIS — E669 Obesity, unspecified: Secondary | ICD-10-CM

## 2022-07-06 DIAGNOSIS — R0602 Shortness of breath: Secondary | ICD-10-CM

## 2022-07-06 DIAGNOSIS — Z6834 Body mass index (BMI) 34.0-34.9, adult: Secondary | ICD-10-CM

## 2022-07-06 NOTE — Progress Notes (Signed)
Office Visit    Patient Name: Veronica [name] Date of Encounter: 07/06/2022  Primary Care Provider:  [place], Betty G, MD Primary Cardiologist:  Bryan Lemma, MD  Chief Complaint   72 year old-year-old female with a history of mild nonobstructive CAD, hypertension, hyperlipidemia, asthma, and anxiety who presents for follow-up related to CAD and hypertension.    Past Medical History    Past Medical History:  Diagnosis Date   Allergic rhinitis    Anxiety and depression    Asthma    Constipation, chronic    COPD (chronic obstructive pulmonary disease) (HCC)    Coronary artery calcification seen on CAT scan 06/2010   Mild nonobstructive CAD on cardiac CT  -as of 2021, coronary calcium score 34   Dyspnea    Hemorrhoids    Hip pain, bilateral    Hyperlipidemia    Hypertension    Leukoplakia of vagina    Low back pain    w/ left side radiculopathy, s/p injection at L4-L5 forarmen 1/06   Uterine fibroid    Past Surgical History:  Procedure Laterality Date   ABDOMINAL HYSTERECTOMY  11-2000   no oophorectomy per pt   CHOLECYSTECTOMY  12/08/2010   Procedure: LAPAROSCOPIC CHOLECYSTECTOMY WITH INTRAOPERATIVE CHOLANGIOGRAM;  Surgeon: [name], MD;  Location: [facility];  Service: General;  Laterality: N/A;  c-arm    COLONOSCOPY WITH PROPOFOL N/A 02/02/2014   Procedure: COLONOSCOPY WITH PROPOFOL;  Surgeon: Charolett Bumpers, MD;  Location: [facility];  Service: Endoscopy;  Laterality: N/A;   CORONARY CALCIUM SCORE  12/2018   Coronary calcium score-34   CORONARY CT ANGIOGRAM  06/2010    Coronary calcium score 12.  Mild nonobstructive disease.  Ostial LAD and first D1/RI.  Right dominant.  Focal scarring and bronchiectasis of medial right lower lobe.   CYST EXCISION Left 08/08/2021   Procedure: EXCISION LEFT UPPER BACK SEBACEOUS CYST X2;  Surgeon: Abigail Miyamoto, MD;  Location: [facility];  Service: General;  Laterality: Left;   LEEP     unknown date    NM MYOVIEW LTD  10/2010    Nonischemic.  Normal EF.   TUBAL LIGATION     bilateral 1989   UTERINE FIBROID SURGERY      Allergies  Allergies  Allergen Reactions   No [employer] Food Allergies Other (See Comments)    Cheese-elevated blood pressure   Peanut-Containing Drug Products Other (See Comments)    headache     Labs/Other Studies Reviewed    The following studies were reviewed today:  Cardiac Studies & Procedures       ECHOCARDIOGRAM  ECHOCARDIOGRAM COMPLETE 03/08/2021  Narrative ECHOCARDIOGRAM REPORT    Patient Name:   Veronica [name] [facility] Date of Exam: 03/08/2021 Medical Rec #:  [id number]      Height:       65.0 in Accession #:    [id number]     Weight:       207.2 lb Date of Birth:  08-15-1950      BSA:          2.008 m Patient Age:    71 years       BP:           138/76 mmHg Patient Gender: F              HR:           61 bpm. Exam Location:  [address]  Procedure: 2D Echo, Cardiac Doppler and Color Doppler  Indications:  R06.02 SOB; R07.9* Chest pain, unspecified  History:        Patient has no prior history of Echocardiogram examinations. Risk Factors:Hypertension and Dyslipidemia. Precordial pain. Obesity. Coronary artery calcification seen on CAT scan. Low back pain. Aortic atherosclerosis.  Sonographer:    Cathie Beams RCS Referring Phys: [phone] Quatisha Zylka C Katriona Schmierer  IMPRESSIONS   1. Left ventricular ejection fraction, by estimation, is 55 to 60%. The left ventricle has normal function. The left ventricle has no regional wall motion abnormalities. Left ventricular diastolic parameters are consistent with Grade I diastolic dysfunction (impaired relaxation). 2. Right ventricular systolic function is normal. The right ventricular size is normal. There is normal pulmonary artery systolic pressure. The estimated right ventricular systolic pressure is 25.7 mmHg. 3. The mitral valve is normal in structure. Trivial mitral valve regurgitation. No evidence of mitral stenosis. 4. The aortic  valve is tricuspid. Aortic valve regurgitation is trivial. Aortic valve sclerosis/calcification is present, without any evidence of aortic stenosis. 5. The inferior vena cava is normal in size with greater than 50% respiratory variability, suggesting right atrial pressure of 3 mmHg.  FINDINGS Left Ventricle: Left ventricular ejection fraction, by estimation, is 55 to 60%. The left ventricle has normal function. The left ventricle has no regional wall motion abnormalities. The left ventricular internal cavity size was normal in size. There is no left ventricular hypertrophy. Left ventricular diastolic parameters are consistent with Grade I diastolic dysfunction (impaired relaxation).  Right Ventricle: The right ventricular size is normal. No increase in right ventricular wall thickness. Right ventricular systolic function is normal. There is normal pulmonary artery systolic pressure. The tricuspid regurgitant velocity is 2.38 m/s, and with an assumed right atrial pressure of 3 mmHg, the estimated right ventricular systolic pressure is 25.7 mmHg.  Left Atrium: Left atrial size was normal in size.  Right Atrium: Right atrial size was normal in size.  Pericardium: There is no evidence of pericardial effusion.  Mitral Valve: The mitral valve is normal in structure. Trivial mitral valve regurgitation. No evidence of mitral valve stenosis.  Tricuspid Valve: The tricuspid valve is normal in structure. Tricuspid valve regurgitation is trivial.  Aortic Valve: The aortic valve is tricuspid. Aortic valve regurgitation is trivial. Aortic regurgitation PHT measures 1139 msec. Aortic valve sclerosis/calcification is present, without any evidence of aortic stenosis.  Pulmonic Valve: The pulmonic valve was normal in structure. Pulmonic valve regurgitation is not visualized.  Aorta: The aortic root is normal in size and structure.  Venous: The inferior vena cava is normal in size with greater than 50%  respiratory variability, suggesting right atrial pressure of 3 mmHg.  IAS/Shunts: No atrial level shunt detected by color flow Doppler.   LEFT VENTRICLE PLAX 2D LVIDd:         4.10 cm   Diastology LVIDs:         2.65 cm   LV e' medial:    9.03 cm/s LV PW:         1.30 cm   LV E/e' medial:  9.6 LV IVS:        1.10 cm   LV e' lateral:   13.10 cm/s LVOT diam:     2.00 cm   LV E/e' lateral: 6.6 LV SV:         71 LV SV Index:   35 LVOT Area:     3.14 cm   RIGHT VENTRICLE RV Basal diam:  2.50 cm RV S prime:     12.10 cm/s TAPSE (M-mode): 2.2  cm RVSP:           25.7 mmHg  LEFT ATRIUM             Index        RIGHT ATRIUM           Index LA diam:        3.40 cm 1.69 cm/m   RA Pressure: 3.00 mmHg LA Vol (A2C):   36.3 ml 18.08 ml/m  RA Area:     14.20 cm LA Vol (A4C):   34.9 ml 17.38 ml/m  RA Volume:   32.70 ml  16.28 ml/m LA Biplane Vol: 35.6 ml 17.73 ml/m AORTIC VALVE LVOT Vmax:   91.80 cm/s LVOT Vmean:  63.800 cm/s LVOT VTI:    0.226 m AI PHT:      1139 msec  AORTA Ao Root diam: 3.10 cm Ao Asc diam:  3.00 cm  MITRAL VALVE                TRICUSPID VALVE MV Area (PHT): 3.31 cm     TR Peak grad:   22.7 mmHg MV Decel Time: 229 msec     TR Vmax:        238.00 cm/s MV E velocity: 86.50 cm/s   Estimated RAP:  3.00 mmHg MV A velocity: 101.00 cm/s  RVSP:           25.7 mmHg MV E/A ratio:  0.86 SHUNTS Systemic VTI:  0.23 m Systemic Diam: 2.00 cm  Dalton McleanMD Electronically signed by Wilfred Lacy Signature Date/Time: 03/08/2021/6:19:37 PM    Final    MONITORS  LONG TERM MONITOR (3-14 DAYS) 02/23/2020  Narrative  Sinus rhythm: Minimum heart rate 55 bpm, maximum 126 bpm with an average of 73 bpm.  Rare (< 1%) isolated PACs and PVCs with the very rare PAC couplets.  1 PAC quadruplet-officially short burst of PAT-rate 148 bpm with an average rate of 124 bpm.  No sustained arrhythmias or significant bradycardia.  Patient wear time: 13-day, 8 hours-analysis  time 12 days 11/27/2019-12/10/2019  4 patient triggers with sinus rhythm, no diary entries.  Essentially normal study with no arrhythmias and minimal PACs/PVCs.   Bryan Lemma, MD   CT SCANS  CT CORONARY MORPH W/CTA COR W/SCORE 03/13/2021  Addendum 03/13/2021  6:43 AM ADDENDUM REPORT: 03/13/2021 06:41  EXAM: OVER-READ INTERPRETATION  CT CHEST  The following report is an over-read performed by radiologist Dr. Schuyler Amor [facility] on 03/13/2021. This over-read does not include interpretation of cardiac or coronary anatomy or pathology. The coronary CTA interpretation by the cardiologist is attached.  COMPARISON:  01/02/2019  FINDINGS: Vascular: No significant abnormality identified.  Mediastinum/nodes: No mass or adenopathy identified.  Lungs/pleura: No pleural effusion, airspace consolidation, or pneumothorax. Scar identified within the lingula.  Upper abdomen: No acute abnormality.  Musculoskeletal: Within the subcutaneous soft tissues of the lower chest wall, eccentric to left of midline, there is a well-circumscribed low-density nodule measuring 1.4 cm, image 59/11. This is favored to represent a benign epidermal inclusion cyst.  Spondylosis noted within the thoracic spine. No acute or suspicious osseous findings.  IMPRESSION: No significant noncardiac supplemental findings identified.   Electronically Signed By: Signa Kell M.D. On: 03/13/2021 06:41  Narrative CLINICAL DATA:  Chest pain  EXAM: Cardiac CTA  MEDICATIONS: Sub lingual nitro. 4mg  and lopressor 100mg   TECHNIQUE: The patient was scanned on a Siemens Force 192 slice scanner. Gantry rotation speed was 250 msecs. Collimation was .6 mm. A  100 kV prospective scan was triggered in the ascending thoracic aorta at 140 HU's Full mA was used between 35% and 75% of the R-R interval. Average HR during the scan was 56 bpm. The 3D data set was interpreted on a dedicated work  station using MPR, MIP and VRT modes. A total of 80 cc of contrast was used.  FINDINGS: Non-cardiac: See separate report from [facility]. No significant findings on limited lung and soft tissue windows.  Calcium Score: Calcium noted in LAD and LCX  LM 0  RCA 0  LAD 23  LCX 5.34  Total: 28.4  Coronary Arteries: Right dominant with no anomalies  LM: Normal  LAD: 1-24% mixed plaque with some external remodeling ostial and proximal vessel  IM: 1-24% calcified plaque  Circumflex: 1-24% calcified plaque proximally  OM1: Normal  OM2: Normal  RCA:  Normal  PDA: Normal  PLA: Normal  IMPRESSION: 1. Calcium Score 28.4 which is 70 th percentile for age/sex  2.  Normal ascending thoracic aorta 3.3 cm  3.  CAD RADS 1 non obstructive CAD see description above  Charlton Haws  Electronically Signed: By: Charlton Haws M.D. On: 03/10/2021 16:42   CT SCANS  CT CARDIAC SCORING (SELF PAY ONLY) 01/02/2019  Addendum 01/02/2019  4:30 PM ADDENDUM REPORT: 01/02/2019 16:28  CLINICAL DATA:  Risk stratification  EXAM: Coronary Calcium Score  TECHNIQUE: The patient was scanned on a [employer] scanner. Axial non-contrast 3 mm slices were carried out through the heart. The data set was analyzed on a dedicated work station and scored using the Agatson method.  FINDINGS: Non-cardiac: See separate report from [facility].  Ascending Aorta: Normal Caliber.  Mildly calcified.  Pericardium: Normal  Coronary arteries: Normal coronary origins. Coronary calcifications on the LAD and LCx.  IMPRESSION: Coronary calcium score of 34. This was 68th percentile for age and sex matched control.  Armanda Magic   Electronically Signed By: Armanda Magic On: 01/02/2019 16:28  Narrative EXAM: OVER-READ INTERPRETATION  CT CHEST  The following report is an over-read performed by radiologist Dr. Jeronimo Greaves of [facility] on 01/02/2019. This  over-read does not include interpretation of cardiac or coronary anatomy or pathology. The calcium score interpretation by the cardiologist is attached.  COMPARISON:  Chest radiograph 04/01/2018. CTA of the chest 10/08/2010.  FINDINGS: Vascular: Aortic atherosclerosis.  Mediastinum/Nodes: No imaged thoracic adenopathy.  Lungs/Pleura: No imaged pleural fluid. Lingular scarring or subsegmental atelectasis.  Upper Abdomen: Normal imaged portions of the liver.  Musculoskeletal: No acute osseous abnormality. Lower thoracic spondylosis.  IMPRESSION: 1.  No acute findings in the imaged extracardiac chest. 2.  Aortic Atherosclerosis (ICD10-I70.0).  Electronically Signed: By: Jeronimo Greaves M.D. On: 01/02/2019 14:20   CT SCANS  CT CORONARY MORPH W/CTA COR W/SCORE 07/27/2010  Narrative *RADIOLOGY REPORT*  INDICATION:  72 year old female with left upper chest pain and left upper extremity pain on the recent airplane flight from [place]. Negative cardiac enzymes x 2 while in the emergency department.  CT ANGIOGRAPHY OF THE HEART (CORONARY ARTERIES), STRUCTURE, AND MORPHOLOGY  CONTRAST:  80 ml Omnipaque 350 IV.  COMPARISON:  None.  TECHNIQUE:  CT angiography of the coronary vessels was performed on a 256 channel system using prospective ECG gating.  A scout and noncontrast exam (for calcium scoring) were performed.  Circulation time was measured using a test bolus.  Coronary CTA was performed with sub mm slice collimation during portions of the cardiac cycle (73%, 78%, and 83%) during injection  of iodinated contrast. Imaging post processing was performed on an independent workstation creating multiplanar and 3-D images, and quantitative analysis of the heart and coronary arteries.  Note that this exam targets the heart and the chest was not imaged in its entirety.  PREMEDICATION: Lopressor 0 mg, P.O. Lopressor 0 mg, IV Nitroglycerin 0.4 mcg,  sublingual.  FINDINGS: Technical quality:  Fair to moderate.  This is due to the fact that the heart rate increased from 60 to 75 during the imaging portion of the examination and due to less than optimal coronary vasodilation after sublingual nitroglycerin. Heart rate:  75  CORONARY ARTERIES: Left main coronary artery:  Normal.  Left anterior descending:  Large ramus branch/first diagonal branch with a small second diagonal identified distally.  Nonobstructive calcified plaque in the LAD origin.  Nonobstructive calcified plaque in the proximal LAD.  Nonobstructive calcified plaque within the large first diagonal/ramus branch.  Left circumflex:  Normal.  2 small visible obtuse marginal branches.  Right coronary artery:  Normal.  Posterior descending artery:  Normal.  Dominance:  Right.  CORONARY CALCIUM:  Total Agatston Score:  12.62 MESA database percentile:  75th  CARDIAC MEASUREMENTS: Interventricular septum (6 - 12 mm):  9 mm LV posterior wall (6 - 12 mm):  10 mm LV diameter in diastole (35 - 52 mm):  44 mm  AORTA AND PULMONARY MEASUREMENTS: Aortic root (21 - 40 mm): 24 mm  at the annulus 33 mm  at the sinuses of Valsalva 22 mm  at the sinotubular junction Ascending aorta (<  40 mm):  33 mm Descending aorta (<  40 mm):  26 mm Main pulmonary artery:  (<  30 mm):  23 mm  EXTRACARDIAC FINDINGS: Focal area of scar and mild bronchiectasis medially in the left lower lobe.  Minimal scarring in the lingula.  Residual thymic tissue in the anterior mediastinum.  No significant lymphadenopathy within the visualized mediastinum.  Visualized extreme upper abdomen unremarkable.  IMPRESSION:  1.  Mild nonobstructive coronary artery disease.  The patient's total coronary artery calcium score is 12.62, which is 75th percentile for patient's matched age and gender. 2.  Nonobstructive calcified plaques at the origin of the LAD, the proximal LAD, and in the midportion of a  large first diagonal/ramus branch. 3.  Right coronary artery dominance. 4.  Focal area of scarring and bronchiectasis medially in the right lower lobe.  No other significant non-cardiac findings.  Report was called to CDU mid level at [phone] at 1150 hours on 07/27/2010.  Delay in the report was due to issues with Information Technology in that I was initially unable to log onto the [place] [employer] independent workstation in order to perform the coronary analysis.  Original Report Authenticated By: Arnell Sieving, M.D.         Recent Labs: 08/04/2021: Hemoglobin 13.1; Platelets 240 09/08/2021: TSH 0.66 01/15/2022: ALT 9; BUN 12; Creatinine, Ser 0.76; Potassium 4.0; Sodium 137  Recent Lipid Panel    Component Value Date/Time   CHOL 215 (H) 01/15/2022 0859   TRIG 111 01/15/2022 0859   HDL 47 01/15/2022 0859   CHOLHDL 4.6 (H) 01/15/2022 0859   CHOLHDL 3.8 08/28/2019 1048   VLDL 33.2 10/07/2018 1103   LDLCALC 148 (H) 01/15/2022 0859   LDLCALC 132 (H) 08/28/2019 1048   LDLDIRECT 161.6 06/03/2012 1043    History of Present Illness    72 year old-year-old female with the above past medical history including mild nonobstructive CAD, hypertension, hyperlipidemia, asthma, and anxiety.  She established care with Dr. Herbie Baltimore in 2020 in the setting of strong family history of CAD. Coronary CTA in 2012 showed calcium score of 12, mild nonobstructive CAD. Follow-up coronary calcium score in 2020 was 34 (68th percentile for age and sex). Outpatient Monitor in October 2021 in the setting of palpitations showed predominantly sinus rhythm, rare PACs/PVCs, short runs of PAT, no significant arrhythmia.  She had multiple ED visits in 2022 in the setting of intermittent chest pain, shortness of breath.  H time she ruled out for ACS.  Repeat coronary CTA in February 2023 in the setting of ongoing chest pain and exertional dyspnea revealed calcium score 28.4 (70 percentile for age/sex), nonobstructive  CAD.  Echocardiogram showed EF 55 to 60%, G1 DD, aortic valve sclerosis without evidence of stenosis.  She was last seen in the office on 07/03/2021 and was stable from a cardiac standpoint.  She denies any symptoms concerning for angina.  She was exercising regularly and reported weight loss.  BP was intermittently elevated.  She was transitioned from losartan to valsartan.   She presents today for follow-up.  Since her last visit she has done well from a cardiac standpoint.  She denies any chest pain, denies shortness of breath, she notes rare intermittent palpitations, denies any associated symptoms.  BP has been somewhat low. She has also noted persistent dizziness in the evenings and upon waking in the mornings.  She denies any presyncope, syncope.  She stopped taking her amlodipine at night and her symptoms have improved somewhat.  She is still trying to lose weight and despite efforts lifestyle modifications with diet and exercise, she has had difficulty losing weight.  Other than her recent dizziness, she reports feeling well.  Home Medications    Current Outpatient Medications  Medication Sig Dispense Refill   ALPRAZolam (XANAX) 0.25 MG tablet TAKE 1 TABLET (0.25 MG TOTAL) BY MOUTH AT BEDTIME AS NEEDED FOR ANXIETY OR SLEEP. 30 tablet 1   amLODipine (NORVASC) 10 MG tablet TAKE 1 TABLET BY MOUTH EVERY DAY 90 tablet 0   citalopram (CELEXA) 10 MG tablet Take 1 tablet (10 mg total) by mouth daily. 30 tablet 2   fluticasone (FLONASE) 50 MCG/ACT nasal spray Place 2 sprays into both nostrils daily. 16 g 5   hydrOXYzine (VISTARIL) 25 MG capsule Take 25 mg by mouth at bedtime as needed.     losartan (COZAAR) 50 MG tablet Take 50 mg by mouth daily.     rosuvastatin (CRESTOR) 20 MG tablet TAKE 1 TABLET BY MOUTH EVERY DAY 90 tablet 3   albuterol (VENTOLIN HFA) 108 (90 Base) MCG/ACT inhaler Inhale 2 puffs into the lungs every 6 (six) hours as needed. (Patient not taking: Reported on 07/06/2022) 8 g 1    fluticasone furoate-vilanterol (BREO ELLIPTA) 200-25 MCG/ACT AEPB With respiratory illness or flare up, start 1 puff daily for 1-2 weeks. (Patient not taking: Reported on 07/06/2022) 60 each 1   montelukast (SINGULAIR) 10 MG tablet Take 1 tablet (10 mg total) by mouth at bedtime. (Patient not taking: Reported on 07/06/2022) 90 tablet 0   omeprazole (PRILOSEC) 40 MG capsule TAKE 1 CAPSULE (40 MG TOTAL) BY MOUTH DAILY. (Patient not taking: Reported on 07/06/2022) 60 capsule 0   predniSONE (DELTASONE) 20 MG tablet Take 2 pills in the morning with breakfast for 3 days, then 1 pill for 2 days (Patient not taking: Reported on 07/06/2022) 8 tablet 0   No current facility-administered medications for this visit.  Review of Systems    She denies chest pain, dyspnea, pnd, orthopnea, n, v, syncope, edema, weight gain, or early satiety. All other systems reviewed and are otherwise negative except as noted above.   Physical Exam    VS:  BP 130/62 (BP Location: Left Arm, Patient Position: Sitting, Cuff Size: Large)   Pulse 64   Ht 5\' 5"  (1.651 m)   Wt 202 lb 12.8 oz (92 kg)   SpO2 95%   BMI 33.75 kg/m  GEN: Well nourished, well developed, in no acute distress. HEENT: normal. Neck: Supple, no JVD, carotid bruits, or masses. Cardiac: RRR, no murmurs, rubs, or gallops. No clubbing, cyanosis, edema.  Radials/DP/PT 2+ and equal bilaterally.  Respiratory:  Respirations regular and unlabored, clear to auscultation bilaterally. GI: Soft, nontender, nondistended, BS + x 4. MS: no deformity or atrophy. Skin: warm and dry, no rash. Neuro:  Strength and sensation are intact. Psych: Normal affect.  Accessory Clinical Findings    ECG personally reviewed by me today - EKG reviewed from 01/2022, sinus bradycardia, 59 bpm - no acute changes.   Lab Results  Component Value Date   WBC 6.2 08/04/2021   HGB 13.1 08/04/2021   HCT 39.4 08/04/2021   MCV 89.5 08/04/2021   PLT 240 08/04/2021   Lab Results  Component  Value Date   CREATININE 0.76 01/15/2022   BUN 12 01/15/2022   NA 137 01/15/2022   K 4.0 01/15/2022   CL 104 01/15/2022   CO2 23 01/15/2022   Lab Results  Component Value Date   ALT 9 01/15/2022   AST 15 01/15/2022   ALKPHOS 75 01/15/2022   BILITOT 0.4 01/15/2022   Lab Results  Component Value Date   CHOL 215 (H) 01/15/2022   HDL 47 01/15/2022   LDLCALC 148 (H) 01/15/2022   LDLDIRECT 161.6 06/03/2012   TRIG 111 01/15/2022   CHOLHDL 4.6 (H) 01/15/2022    Lab Results  Component Value Date   HGBA1C 5.6 10/13/2020    Assessment & Plan    1. Mild nonobstructive CAD/chest pain/shortness of breath: Repeat coronary CTA 03/2021, showed calcium score 28.4 (70 percentile for age/sex), nonobstructive CAD. Echo showed EF 55 to 60%, G1 DD, aortic valve sclerosis without evidence of stenosis. Dyspnea on exertion improved with inhalers and increased activity. Denies symptoms concerning for angina.  Continue losartan, Crestor.  2. Palpitations/dizziness: Monitor in October 2021 in the setting of palpitations showed predominantly sinus rhythm, rare PACs/PVCs, short runs of PAT, no significant arrhythmia.  She does note intermittent fleeting palpitations, she has had recent dizziness in the evenings and upon waking in the mornings.  She stopped taking her nighttime amlodipine and has noticed some mild improvement in her symptoms.  She denies any presyncope, syncope.  EKG in 01/2022 showed sinus bradycardia, 59 bpm.  We discussed possibility of repeat cardiac monitor, however, patient declines.  Will check carotid Dopplers.  Will update CBC, CMET.  Discussed ED precautions.  Continue to monitor symptoms.   3. Hypertension: BP well controlled, she has had some recent dizziness.  Concur amlodipine over the past week and her symptoms have improved slightly.  Will discontinue amlodipine.  Continue losartan.  She notes she recently picked up her losartan from her pharmacy and the bottle read 100 mg daily.   She should be taking losartan 50 mg daily.  We will reach out to her pharmacy to clarify.  4. Hyperlipidemia: LDL was 148 in 12/2021.  Not taking her Crestor at the time.  Will update fasting lipid panel, CMET.  Continue Crestor.   5. Obesity: Encouraged ongoing lifestyle modifications with diet and exercise.  Continues to struggle with weight loss.  I advised her to follow-up with her PCP to discuss possible benefits of GLP-1 receptor agonist for weight loss (Wegovy or Zepbound).   6. Disposition: Follow-up in 3 months, sooner if needed.       Joylene Grapes, NP 07/06/2022, 1:43 PM

## 2022-07-06 NOTE — Patient Instructions (Addendum)
Medication Instructions:  Stop Amlodipine as directed.   *If you need a refill on your cardiac medications before your next appointment, please call your pharmacy*   Lab Work: Fasting lipid panel, CBC, CMET Monday 07/09/2022.   Testing/Procedures: Your physician has requested that you have a carotid duplex. This test is an ultrasound of the carotid arteries in your neck. It looks at blood flow through these arteries that supply the brain with blood. Allow one hour for this exam. There are no restrictions or special instructions.    Follow-Up: At [facility], you and your health needs are our priority.  As part of our continuing mission to provide you with exceptional heart care, we have created designated Provider Care Teams.  These Care Teams include your primary Cardiologist (physician) and Advanced Practice Providers (APPs -  Physician Assistants and Nurse Practitioners) who all work together to provide you with the care you need, when you need it.  We recommend signing up for the patient portal called "MyChart".  Sign up information is provided on this After Visit Summary.  MyChart is used to connect with patients for Virtual Visits (Telemedicine).  Patients are able to view lab/test results, encounter notes, upcoming appointments, etc.  Non-urgent messages can be sent to your provider as well.   To learn more about what you can do with MyChart, go to [email or web address].    Your next appointment:   3 month(s)  Provider:   Bernadene Person, NP        Other Instructions Follow up with Primary Care Physician to inquire of Glp1 receptor  Reginia Forts, [place])

## 2022-07-09 DIAGNOSIS — E785 Hyperlipidemia, unspecified: Secondary | ICD-10-CM | POA: Diagnosis not present

## 2022-07-09 DIAGNOSIS — I2510 Atherosclerotic heart disease of native coronary artery without angina pectoris: Secondary | ICD-10-CM | POA: Diagnosis not present

## 2022-07-09 DIAGNOSIS — R002 Palpitations: Secondary | ICD-10-CM | POA: Diagnosis not present

## 2022-07-09 DIAGNOSIS — R0602 Shortness of breath: Secondary | ICD-10-CM | POA: Diagnosis not present

## 2022-07-09 DIAGNOSIS — I10 Essential (primary) hypertension: Secondary | ICD-10-CM | POA: Diagnosis not present

## 2022-07-10 LAB — LIPID PANEL
Chol/HDL Ratio: 2.7 ratio (ref 0.0–4.4)
Cholesterol, Total: 146 mg/dL (ref 100–199)
HDL: 55 mg/dL (ref >39)
LDL Chol Calc (NIH): 78 mg/dL (ref 0–99)
Triglycerides: 61 mg/dL (ref 0–149)
VLDL Cholesterol Cal: 13 mg/dL (ref 5–40)

## 2022-07-10 LAB — COMPREHENSIVE METABOLIC PANEL
ALT: 21 IU/L (ref 0–32)
AST: 25 IU/L (ref 0–40)
Albumin/Globulin Ratio: 1.7
Albumin: 4.3 g/dL (ref 3.9–4.9)
Alkaline Phosphatase: 73 IU/L (ref 44–121)
BUN/Creatinine Ratio: 17 (ref 12–28)
BUN: 10 mg/dL (ref 8–27)
Bilirubin Total: 0.4 mg/dL (ref 0.0–1.2)
CO2: 23 mmol/L (ref 20–29)
Calcium: 8.9 mg/dL (ref 8.7–10.3)
Chloride: 105 mmol/L (ref 96–106)
Creatinine, Ser: 0.6 mg/dL (ref 0.57–1.00)
Globulin, Total: 2.6 g/dL (ref 1.5–4.5)
Glucose: 87 mg/dL (ref 70–99)
Potassium: 4.1 mmol/L (ref 3.5–5.2)
Sodium: 139 mmol/L (ref 134–144)
Total Protein: 6.9 g/dL (ref 6.0–8.5)
eGFR: 96 mL/min/{1.73_m2} (ref >59)

## 2022-07-10 LAB — CBC
Hematocrit: 37.2 % (ref 34.0–46.6)
Hemoglobin: 12.3 g/dL (ref 11.1–15.9)
MCH: 29.4 pg (ref 26.6–33.0)
MCHC: 33.1 g/dL (ref 31.5–35.7)
MCV: 89 fL (ref 79–97)
Platelets: 216 10*3/uL (ref 150–450)
RBC: 4.19 x10E6/uL (ref 3.77–5.28)
RDW: 14.1 % (ref 11.7–15.4)
WBC: 3.9 10*3/uL (ref 3.4–10.8)

## 2022-07-18 ENCOUNTER — Other Ambulatory Visit: Payer: Self-pay

## 2022-07-18 ENCOUNTER — Telehealth: Payer: Self-pay

## 2022-07-18 DIAGNOSIS — I10 Essential (primary) hypertension: Secondary | ICD-10-CM

## 2022-07-18 DIAGNOSIS — Z79899 Other long term (current) drug therapy: Secondary | ICD-10-CM

## 2022-07-18 DIAGNOSIS — E7800 Pure hypercholesterolemia, unspecified: Secondary | ICD-10-CM

## 2022-07-18 NOTE — Telephone Encounter (Signed)
Spoke with pt. Pt was notified of lab results. Pt states that she is only taking a half tab of the Crestor. Pt is going to start taking the full 20 mg dose of Crestor and repeat labs in 6-8 weeks. Lab order placed.

## 2022-07-24 ENCOUNTER — Ambulatory Visit (HOSPITAL_COMMUNITY)
Admission: RE | Admit: 2022-07-24 | Discharge: 2022-07-24 | Disposition: A | Discharge status: Home / Self Care | Payer: 59 | Source: Ambulatory Visit | Attending: Cardiology | Admitting: Cardiology

## 2022-07-24 DIAGNOSIS — R42 Dizziness and giddiness: Secondary | ICD-10-CM | POA: Insufficient documentation

## 2022-07-30 ENCOUNTER — Telehealth: Payer: Self-pay

## 2022-07-30 NOTE — Telephone Encounter (Signed)
Spoke with pt. Pt was notified of carotid artery results. Pt will continue current medication and f/u as planned.

## 2022-08-01 ENCOUNTER — Other Ambulatory Visit: Payer: Self-pay | Admitting: Pulmonary Disease

## 2022-08-01 ENCOUNTER — Other Ambulatory Visit: Payer: Self-pay | Admitting: Family Medicine

## 2022-08-01 DIAGNOSIS — J4520 Mild intermittent asthma, uncomplicated: Secondary | ICD-10-CM

## 2022-08-01 DIAGNOSIS — R0609 Other forms of dyspnea: Secondary | ICD-10-CM

## 2022-08-01 DIAGNOSIS — J4530 Mild persistent asthma, uncomplicated: Secondary | ICD-10-CM

## 2022-08-01 DIAGNOSIS — J301 Allergic rhinitis due to pollen: Secondary | ICD-10-CM

## 2022-08-14 ENCOUNTER — Ambulatory Visit: Payer: 59 | Admitting: Internal Medicine

## 2022-08-29 ENCOUNTER — Encounter (INDEPENDENT_AMBULATORY_CARE_PROVIDER_SITE_OTHER): Payer: Self-pay

## 2022-09-13 DIAGNOSIS — E7800 Pure hypercholesterolemia, unspecified: Secondary | ICD-10-CM | POA: Diagnosis not present

## 2022-09-13 DIAGNOSIS — Z79899 Other long term (current) drug therapy: Secondary | ICD-10-CM | POA: Diagnosis not present

## 2022-09-13 DIAGNOSIS — I10 Essential (primary) hypertension: Secondary | ICD-10-CM | POA: Diagnosis not present

## 2022-09-18 ENCOUNTER — Telehealth (HOSPITAL_BASED_OUTPATIENT_CLINIC_OR_DEPARTMENT_OTHER): Payer: Self-pay

## 2022-09-18 NOTE — Telephone Encounter (Signed)
Spoke with pt. Pt was notified of lab results. Pt will continue current medication and f/u as planned.  

## 2022-09-20 ENCOUNTER — Other Ambulatory Visit: Payer: Self-pay | Admitting: Family Medicine

## 2022-09-20 ENCOUNTER — Other Ambulatory Visit: Payer: Self-pay | Admitting: Nurse Practitioner

## 2022-09-20 ENCOUNTER — Other Ambulatory Visit: Payer: Self-pay | Admitting: Cardiology

## 2022-09-20 DIAGNOSIS — I10 Essential (primary) hypertension: Secondary | ICD-10-CM

## 2022-09-21 ENCOUNTER — Telehealth: Payer: Self-pay

## 2022-09-21 NOTE — Telephone Encounter (Signed)
Unsuccessful attempt to reach patient on preferred number list in notes for scheduled AWV. Left message on voicemail okay to reschedule.

## 2022-09-24 NOTE — Telephone Encounter (Signed)
Encounter an error disregard.

## 2022-09-28 ENCOUNTER — Encounter: Payer: Self-pay | Admitting: Family

## 2022-09-28 ENCOUNTER — Ambulatory Visit (HOSPITAL_BASED_OUTPATIENT_CLINIC_OR_DEPARTMENT_OTHER)
Admission: RE | Admit: 2022-09-28 | Discharge: 2022-09-28 | Disposition: A | Discharge status: Home / Self Care | Payer: 59 | Source: Ambulatory Visit | Attending: Family | Admitting: Family

## 2022-09-28 ENCOUNTER — Ambulatory Visit (INDEPENDENT_AMBULATORY_CARE_PROVIDER_SITE_OTHER): Payer: 59 | Admitting: Family

## 2022-09-28 ENCOUNTER — Telehealth: Payer: Self-pay | Admitting: Cardiology

## 2022-09-28 VITALS — BP 130/72 | HR 59 | Resp 18 | Ht 65.0 in | Wt 207.0 lb

## 2022-09-28 DIAGNOSIS — M791 Myalgia, unspecified site: Secondary | ICD-10-CM | POA: Diagnosis not present

## 2022-09-28 DIAGNOSIS — M542 Cervicalgia: Secondary | ICD-10-CM | POA: Insufficient documentation

## 2022-09-28 DIAGNOSIS — M47812 Spondylosis without myelopathy or radiculopathy, cervical region: Secondary | ICD-10-CM | POA: Diagnosis not present

## 2022-09-28 NOTE — Telephone Encounter (Signed)
  Per MyChart scheduling message:  Initial Complaint:  feel tingling and pain in my left side of my neck, and discomfort dizziness in my right side of my head when I laid down   Patient was asked Are you having any chest pain/tightness, shortness of breath? When did the symptoms start? Are they constant or come and go?   Answer: No, I don't have it. Start about 3 weeks ago, comes and go.

## 2022-09-28 NOTE — Telephone Encounter (Signed)
Returned call to pt. Pt states she has no symptoms but when she lays down she feels some pain in the left side of her neck and the right side of her head feels uncomfortable and she is a little dizzy. Pt stated it started about 3 weeks ago and it does come and go. It is not every time she lays down. Pt does not have any BP or HR to give. Please advise.

## 2022-09-28 NOTE — Progress Notes (Signed)
Veronica [name] is a 72 y.o. female with the following history as recorded in EpicCare:  Patient Active Problem List   Diagnosis Date Noted   Asthma, intermittent, uncomplicated 05/08/2022   Palpitations 10/29/2020   Chest discomfort 10/29/2020   Aortic atherosclerosis (HCC) 01/10/2019   Family history of heart disease 12/12/2018   Vitamin D deficiency, unspecified 02/19/2018   Polyarthralgia 02/19/2018   Multiple thyroid nodules 01/06/2018   Insomnia secondary to anxiety 08/26/2015   PCP NOTES >>>>>>>>>>>> 04/29/2015   Encounter for routine gynecological examination 05/10/2014   GERD (gastroesophageal reflux disease) 10/25/2010   Essential hypertension 09/15/2010   Lumbar back pain with radiculopathy affecting left lower extremity 10/07/2009   HIP PAIN, BILATERAL 10/08/2008   Hyperlipidemia with target LDL less than 100 05/19/2008   LEUKOPLAKIA OF VAGINA 05/18/2008   Anxiety disorder 01/24/2006   Allergic rhinitis 01/24/2006   CONSTIPATION, CHRONIC 01/24/2006    Current Outpatient Medications  Medication Sig Dispense Refill   citalopram (CELEXA) 10 MG tablet Take 1 tablet (10 mg total) by mouth daily. 30 tablet 2   losartan (COZAAR) 50 MG tablet TAKE 1 TABLET BY MOUTH EVERY DAY 90 tablet 2   rosuvastatin (CRESTOR) 20 MG tablet TAKE 1 TABLET BY MOUTH EVERY DAY 90 tablet 3   albuterol (VENTOLIN HFA) 108 (90 Base) MCG/ACT inhaler Inhale 2 puffs into the lungs every 6 (six) hours as needed. (Patient not taking: Reported on 07/06/2022) 8 g 1   ALPRAZolam (XANAX) 0.25 MG tablet TAKE 1 TABLET (0.25 MG TOTAL) BY MOUTH AT BEDTIME AS NEEDED FOR ANXIETY OR SLEEP. (Patient not taking: Reported on 09/28/2022) 30 tablet 1   fluticasone (FLONASE) 50 MCG/ACT nasal spray Place 2 sprays into both nostrils daily. (Patient not taking: Reported on 09/28/2022) 16 g 5   fluticasone furoate-vilanterol (BREO ELLIPTA) 200-25 MCG/ACT AEPB With respiratory illness or flare up, start 1 puff daily for 1-2 weeks.  (Patient not taking: Reported on 07/06/2022) 60 each 1   hydrOXYzine (VISTARIL) 25 MG capsule Take 25 mg by mouth at bedtime as needed. (Patient not taking: Reported on 09/28/2022)     montelukast (SINGULAIR) 10 MG tablet TAKE 1 TABLET BY MOUTH EVERYDAY AT BEDTIME (Patient not taking: Reported on 09/28/2022) 90 tablet 0   omeprazole (PRILOSEC) 40 MG capsule TAKE 1 CAPSULE (40 MG TOTAL) BY MOUTH DAILY. (Patient not taking: Reported on 07/06/2022) 60 capsule 0   No current facility-administered medications for this visit.    Allergies: No healthtouch food allergies and Peanut-containing drug products  Past Medical History:  Diagnosis Date   Allergic rhinitis    Anxiety and depression    Asthma    Constipation, chronic    COPD (chronic obstructive pulmonary disease) (HCC)    Coronary artery calcification seen on CAT scan 06/2010   Mild nonobstructive CAD on cardiac CT  -as of 2021, coronary calcium score 34   Dyspnea    Hemorrhoids    Hip pain, bilateral    Hyperlipidemia    Hypertension    Leukoplakia of vagina    Low back pain    w/ left side radiculopathy, s/p injection at L4-L5 forarmen 1/06   Uterine fibroid     Past Surgical History:  Procedure Laterality Date   ABDOMINAL HYSTERECTOMY  11-2000   no oophorectomy per pt   CHOLECYSTECTOMY  12/08/2010   Procedure: LAPAROSCOPIC CHOLECYSTECTOMY WITH INTRAOPERATIVE CHOLANGIOGRAM;  Surgeon: [name], MD;  Location: [facility];  Service: General;  Laterality: N/A;  c-arm  COLONOSCOPY WITH PROPOFOL N/A 02/02/2014   Procedure: COLONOSCOPY WITH PROPOFOL;  Surgeon: Charolett Bumpers, MD;  Location: [facility];  Service: Endoscopy;  Laterality: N/A;   CORONARY CALCIUM SCORE  12/2018   Coronary calcium score-34   CORONARY CT ANGIOGRAM  06/2010    Coronary calcium score 12.  Mild nonobstructive disease.  Ostial LAD and first D1/RI.  Right dominant.  Focal scarring and bronchiectasis of medial right lower lobe.   CYST EXCISION Left 08/08/2021    Procedure: EXCISION LEFT UPPER BACK SEBACEOUS CYST X2;  Surgeon: Abigail Miyamoto, MD;  Location: [facility];  Service: General;  Laterality: Left;   LEEP     unknown date    NM MYOVIEW LTD  10/2010   Nonischemic.  Normal EF.   TUBAL LIGATION     bilateral 1989   UTERINE FIBROID SURGERY      Family History  Problem Relation Age of Onset   Hypertension Mother    Hyperlipidemia Mother    Heart attack Mother 75   Heart disease Mother    Anxiety disorder Mother    Hypertension Father    Hyperlipidemia Father    AAA (abdominal aortic aneurysm) Father 93   Heart disease Father    Allergic rhinitis Sister    Allergic rhinitis Brother    Schizophrenia Brother    CVA Brother    Valvular heart disease Brother        Valve Surgery   Allergic rhinitis Brother    Heart disease Brother        Vavle surgery   Esophageal cancer Maternal Grandmother    Schizophrenia Son    Schizophrenia Other        2 nieces   Diabetes Neg Hx    Colon cancer Neg Hx    Breast cancer Neg Hx     Social History   Tobacco Use   Smoking status: Never    Passive exposure: Never   Smokeless tobacco: Never  Substance Use Topics   Alcohol use: Never    Comment: rarely    Subjective:   Seen as U/C appointment today- her PCP is not available; Neck/ shoulder pain x 5 weeks; noticed secondary dizziness that developed approximately 3 weeks ago- does not feel that the room is spinning/ "just feel off balance." No chest pain, shortness of breath; is under the care of cardiology;  Feels pain radiating from neck into shoulder; no known injury or trauma;  Had carotid doppler in June 2024 which was normal;   Is concerned that not doing well on Crestor- having increased aches/ pains in her upper legs;      Objective:  Vitals:   09/28/22 1402  BP: 130/72  Pulse: (!) 59  Resp: 18  SpO2: 97%  Weight: 207 lb (93.9 kg)  Height: 5\' 5"  (1.651 m)    General: Well developed, well nourished, in no acute distress   Skin : Warm and dry.  Head: Normocephalic and atraumatic  Eyes: Sclera and conjunctiva clear; pupils round and reactive to light; extraocular movements intact  Ears: External normal; canals clear; tympanic membranes normal  Oropharynx: Pink, supple. No suspicious lesions  Neck: Supple without thyromegaly, adenopathy  Lungs: Respirations unlabored; clear to auscultation bilaterally without wheeze, rales, rhonchi  CVS exam: normal rate and regular rhythm.  Musculoskeletal: No deformities; no active joint inflammation  Extremities: No edema, cyanosis, clubbing  Vessels: Symmetric bilaterally  Neurologic: Alert and oriented; speech intact; face symmetrical; moves all extremities well; CNII-XII intact without  focal deficit   Assessment:  1. Neck pain   2. Myalgia     Plan:  Will update cervical X-ray; she will plan to see her PCP in follow up next week; to consider MRI based on X-ray results; ? Related to her Rosuvastatin- she is concerned that dosage is too high; can try cutting in 1/2 and discuss with her PCP at follow up next week.   No follow-ups on file.  Orders Placed This Encounter  Procedures   DG Cervical Spine Complete    Standing Status:   Future    Number of Occurrences:   1    Standing Expiration Date:   09/28/2023    Order Specific Question:   Reason for Exam (SYMPTOM  OR DIAGNOSIS REQUIRED)    Answer:   neck pain    Order Specific Question:   Preferred imaging location?    Answer:   [facility], [facility]    Requested Prescriptions    No prescriptions requested or ordered in this encounter

## 2022-09-28 NOTE — Patient Instructions (Signed)
Try taking 1/2 tablet of the Rosuvastatin until you see Dr. [place] and see if that helps you feel better;

## 2022-10-02 NOTE — Progress Notes (Unsigned)
HPI:  Chief Complaint  Patient presents with   Medical Management of Chronic Issues    [name] is a 72 y.o. female, who is here today for chronic disease management. Last seen on 05/08/22  Hypertension:  Medications:*** BP readings at home:*** Side effects:***  Negative for unusual or severe headache, visual changes, exertional chest pain, dyspnea,  focal weakness, or edema.  Lab Results  Component Value Date   CREATININE 0.60 07/09/2022   BUN 10 07/09/2022   NA 139 07/09/2022   K 4.1 07/09/2022   CL 105 07/09/2022   CO2 23 07/09/2022   Anxiety: *** Lab Results  Component Value Date   CHOL 141 09/13/2022   HDL 54 09/13/2022   LDLCALC 67 09/13/2022   LDLDIRECT 161.6 06/03/2012   TRIG 109 09/13/2022   CHOLHDL 2.6 09/13/2022    Review of Systems See other pertinent positives and negatives in HPI.  Current Outpatient Medications on File Prior to Visit  Medication Sig Dispense Refill   albuterol (VENTOLIN HFA) 108 (90 Base) MCG/ACT inhaler Inhale 2 puffs into the lungs every 6 (six) hours as needed. (Patient not taking: Reported on 07/06/2022) 8 g 1   ALPRAZolam (XANAX) 0.25 MG tablet TAKE 1 TABLET (0.25 MG TOTAL) BY MOUTH AT BEDTIME AS NEEDED FOR ANXIETY OR SLEEP. (Patient not taking: Reported on 09/28/2022) 30 tablet 1   citalopram (CELEXA) 10 MG tablet Take 1 tablet (10 mg total) by mouth daily. 30 tablet 2   fluticasone (FLONASE) 50 MCG/ACT nasal spray Place 2 sprays into both nostrils daily. (Patient not taking: Reported on 09/28/2022) 16 g 5   fluticasone furoate-vilanterol (BREO ELLIPTA) 200-25 MCG/ACT AEPB With respiratory illness or flare up, start 1 puff daily for 1-2 weeks. (Patient not taking: Reported on 07/06/2022) 60 each 1   hydrOXYzine (VISTARIL) 25 MG capsule Take 25 mg by mouth at bedtime as needed. (Patient not taking: Reported on 09/28/2022)     losartan (COZAAR) 50 MG tablet TAKE 1 TABLET BY MOUTH EVERY DAY 90 tablet 2   montelukast (SINGULAIR)  10 MG tablet TAKE 1 TABLET BY MOUTH EVERYDAY AT BEDTIME (Patient not taking: Reported on 09/28/2022) 90 tablet 0   omeprazole (PRILOSEC) 40 MG capsule TAKE 1 CAPSULE (40 MG TOTAL) BY MOUTH DAILY. (Patient not taking: Reported on 07/06/2022) 60 capsule 0   rosuvastatin (CRESTOR) 20 MG tablet TAKE 1 TABLET BY MOUTH EVERY DAY 90 tablet 3   No current facility-administered medications on file prior to visit.   Past Medical History:  Diagnosis Date   Allergic rhinitis    Anxiety and depression    Asthma    Constipation, chronic    COPD (chronic obstructive pulmonary disease) (HCC)    Coronary artery calcification seen on CAT scan 06/2010   Mild nonobstructive CAD on cardiac CT  -as of 2021, coronary calcium score 34   Dyspnea    Hemorrhoids    Hip pain, bilateral    Hyperlipidemia    Hypertension    Leukoplakia of vagina    Low back pain    w/ left side radiculopathy, s/p injection at L4-L5 forarmen 1/06   Uterine fibroid     Allergies  Allergen Reactions   No Healthtouch Food Allergies Other (See Comments)    Cheese-elevated blood pressure   Peanut-Containing Drug Products Other (See Comments)    headache    Social History   Socioeconomic History   Marital status: Divorced    Spouse name: Not on file  Number of children: 3   Years of education: Not on file   Highest education level: Not on file  Occupational History   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   Social Determinants of Health   Financial Resource Strain: Low Risk  (09/18/2021)   Overall Financial Resource Strain (CARDIA)    Difficulty of Paying Living Expenses: Not hard at all  Food Insecurity: No Food Insecurity (09/18/2021)   Hunger Vital Sign    Worried About Running Out of Food in the Last Year: Never true    Ran Out of Food in the Last Year: Never true  Transportation Needs: No Transportation Needs (09/18/2021)   PRAPARE - Administrator, Civil Service (Medical): No    Lack of Transportation (Non-Medical): No  Physical Activity: Sufficiently Active (09/18/2021)   Exercise Vital Sign    Days of Exercise per Week: 5 days    Minutes of Exercise per Session: 50 min  Stress: No Stress Concern Present (09/18/2021)   [employer] of Occupational Health - Occupational Stress Questionnaire    Feeling of Stress : Not at all  Social Connections: Moderately Integrated (09/18/2021)   Social Connection and Isolation Panel [NHANES]    Frequency of Communication with Friends and Family: More than three times a week    Frequency of Social Gatherings with Friends and Family: More than three times a week    Attends Religious Services: More than 4 times per year    Active Member of [employer] or Organizations: Yes    Attends Engineer, structural: More than 4 times per year    Marital Status: Divorced    Today's Vitals   10/03/22 1443  BP: 136/80  Pulse: 73  Resp: 16  Temp: 98.8 F (37.1 C)  TempSrc: Oral  SpO2: 98%  Weight: 211 lb 6 oz (95.9 kg)  Height: 5\' 5"  (1.651 m)    Body mass index is 35.17 kg/m.  Physical Exam Vitals and nursing note reviewed.  Constitutional:      General: She is not in acute distress.    Appearance: She is well-developed.  HENT:     Head: Normocephalic and atraumatic.     Mouth/Throat:     Mouth: Mucous membranes are moist.     Pharynx: Oropharynx is clear.  Eyes:      Conjunctiva/sclera: Conjunctivae normal.  Cardiovascular:     Rate and Rhythm: Normal rate and regular rhythm.     Pulses:          Dorsalis pedis pulses are 2+ on the right side and 2+ on the left side.     Heart sounds: No murmur heard. Pulmonary:     Effort: Pulmonary effort is normal. No respiratory distress.     Breath sounds: Normal breath sounds.  Abdominal:  Palpations: Abdomen is soft. There is no hepatomegaly or mass.     Tenderness: There is no abdominal tenderness.  Lymphadenopathy:     Cervical: No cervical adenopathy.  Skin:    General: Skin is warm.     Findings: No erythema or rash.  Neurological:     General: No focal deficit present.     Mental Status: She is alert and oriented to person, place, and time.     Cranial Nerves: No cranial nerve deficit.     Gait: Gait normal.  Psychiatric:     Comments: Well groomed, good eye contact.     ASSESSMENT AND PLAN:  There are no diagnoses linked to this encounter.  No orders of the defined types were placed in this encounter.   No problem-specific Assessment & Plan notes found for this encounter.   No follow-ups on file.   Monesha Monreal [place], MD [facility]. [place] [facility].

## 2022-10-03 ENCOUNTER — Ambulatory Visit: Payer: 59 | Admitting: Family Medicine

## 2022-10-03 ENCOUNTER — Encounter: Payer: Self-pay | Admitting: Family Medicine

## 2022-10-03 ENCOUNTER — Ambulatory Visit (INDEPENDENT_AMBULATORY_CARE_PROVIDER_SITE_OTHER): Payer: 59 | Admitting: Family Medicine

## 2022-10-03 VITALS — BP 136/80 | HR 73 | Temp 98.8°F | Resp 16 | Ht 65.0 in | Wt 211.4 lb

## 2022-10-03 DIAGNOSIS — F411 Generalized anxiety disorder: Secondary | ICD-10-CM

## 2022-10-03 DIAGNOSIS — E785 Hyperlipidemia, unspecified: Secondary | ICD-10-CM

## 2022-10-03 DIAGNOSIS — I10 Essential (primary) hypertension: Secondary | ICD-10-CM | POA: Diagnosis not present

## 2022-10-03 DIAGNOSIS — T7020XA Unspecified effects of high altitude, initial encounter: Secondary | ICD-10-CM

## 2022-10-03 MED ORDER — ATORVASTATIN CALCIUM 20 MG PO TABS
20.0000 mg | ORAL_TABLET | Freq: Every day | ORAL | 3 refills | Status: DC
Start: 2022-10-03 — End: 2023-08-27

## 2022-10-03 MED ORDER — CITALOPRAM HYDROBROMIDE 10 MG PO TABS
10.0000 mg | ORAL_TABLET | Freq: Every day | ORAL | 2 refills | Status: DC
Start: 2022-10-03 — End: 2023-02-04

## 2022-10-03 MED ORDER — HYDROXYZINE HCL 25 MG PO TABS
25.0000 mg | ORAL_TABLET | Freq: Every evening | ORAL | 2 refills | Status: DC | PRN
Start: 2022-10-03 — End: 2023-02-11

## 2022-10-03 NOTE — Telephone Encounter (Signed)
I do not know what the symptoms are related to.  I have not seen her in over 2 years.  I would suggest that these her symptoms are probably would best discussed with PCP as it does not seem to be cardiac in nature.   Bryan Lemma, MD

## 2022-10-03 NOTE — Patient Instructions (Addendum)
A few things to remember from today's visit:  Hyperlipidemia with target LDL less than 100 - Plan: atorvastatin (LIPITOR) 20 MG tablet  Generalized anxiety disorder - Plan: citalopram (CELEXA) 10 MG tablet, hydrOXYzine (ATARAX) 25 MG tablet  Essential hypertension  Resolving altitude sickness  Continue monitoring blood pressures. Rosuvastatin changed to Atorvastatin as requested. May need labs in 3-4 months ot be sure cholesterol is still at goal.  If you need refills for medications you take chronically, please call your pharmacy. Do not use My Chart to request refills or for acute issues that need immediate attention. If you send a my chart message, it may take a few days to be addressed, specially if I am not in the office.  Please be sure medication list is accurate. If a new problem present, please set up appointment sooner than planned today.

## 2022-10-03 NOTE — Telephone Encounter (Signed)
Returned pt call. Relayed message below from Dr. Herbie Baltimore. Pt is currently at her PCP.  I do not know what the symptoms are related to.  I have not seen her in over 2 years.  I would suggest that these her symptoms are probably would best discussed with PCP as it does not seem to be cardiac in nature.     Bryan Lemma, MD

## 2022-10-04 NOTE — Assessment & Plan Note (Signed)
LDL 67 on 09/13/22. She would like to change Rosuvastatin to Atorvastatin because the former one caused myalgias. Recommend Atorvastatin 40 mg, prefers lower dose, so 20 mg daily recommended. Continue low fat diet.

## 2022-10-04 NOTE — Assessment & Plan Note (Signed)
Some SBP's mildly elevated at home. We discussed options: Increasing Losartan or adding Amlodipine, she prefers not to change treatment until she sees her cardiologist. Continue Losartan 50 mg daily. Low salt/DASH recommended. Eye exam is current. F/U in 6 months.

## 2022-10-04 NOTE — Assessment & Plan Note (Signed)
She would like to stop seeing psychiatrist, reports problem as well controlled with current regimen. Continue Celexa 10 mg daily and Hydroxyzine 25 mg, the latter she would like to change it from caps to tab, so she can split tab. Side effects reviewed. F/U in 6 months.

## 2022-10-10 ENCOUNTER — Ambulatory Visit: Payer: 59 | Admitting: Nurse Practitioner

## 2022-10-16 NOTE — Progress Notes (Deleted)
Cardiology Office Note:    Date:  10/16/2022   ID:  Veronica [name], DOB 11-16-1950, MRN [id number]  PCP:  [place], Betty G, MD   [facility] Providers Cardiologist:  Bryan Lemma, MD { Click to update primary MD,subspecialty MD or APP then REFRESH:1}    Referring MD: [place], Betty G, MD   No chief complaint on file. ***  History of Present Illness:    Veronica [name] is a 72 y.o. female with a hx of nonobstructive CAD, HTN, HLD, asthma, and anxiety. Coronary CTA 03/2021 showed coronary calcium score of 28 placing her at the [facility], no obstructive CAD. Heart monitor 10/2019 for palpitations with no significant arrhythmias short runs of PAT. Echo 2023 with preserved EF, G1 DD, and no significant valvular disease. She was last seen 07/06/22 with NP Monge and reported recent dizziness.  This improved somewhat when she stopped her PM amlodipine.     CAD - nonobstructive - by CT coronary 03/2021 - risk factor modification - continue losartan and crestor      Dizziness - stopped amlodipine   Palpitations - she declined repeat heart monitor at last visit - EKG in Jan 2024 with sinus bradycardia HR 59   Hypertension - losartan 50 mg - clarify this   Hyperlipidemia with LDL Goal < 70 09/13/2022: Cholesterol, Total 141; HDL 54; LDL Chol Calc (NIH) 67; Triglycerides 109 Continue crestor   Obesity - discussing GLP-1 agonist with PCP     Past Medical History:  Diagnosis Date   Allergic rhinitis    Anxiety and depression    Asthma    Constipation, chronic    COPD (chronic obstructive pulmonary disease) (HCC)    Coronary artery calcification seen on CAT scan 06/2010   Mild nonobstructive CAD on cardiac CT  -as of 2021, coronary calcium score 34   Dyspnea    Hemorrhoids    Hip pain, bilateral    Hyperlipidemia    Hypertension    Leukoplakia of vagina    Low back pain    w/ left side radiculopathy, s/p injection at L4-L5 forarmen 1/06   Uterine fibroid      Past Surgical History:  Procedure Laterality Date   ABDOMINAL HYSTERECTOMY  11-2000   no oophorectomy per pt   CHOLECYSTECTOMY  12/08/2010   Procedure: LAPAROSCOPIC CHOLECYSTECTOMY WITH INTRAOPERATIVE CHOLANGIOGRAM;  Surgeon: [name], MD;  Location: [facility];  Service: General;  Laterality: N/A;  c-arm    COLONOSCOPY WITH PROPOFOL N/A 02/02/2014   Procedure: COLONOSCOPY WITH PROPOFOL;  Surgeon: Charolett Bumpers, MD;  Location: [facility];  Service: Endoscopy;  Laterality: N/A;   CORONARY CALCIUM SCORE  12/2018   Coronary calcium score-34   CORONARY CT ANGIOGRAM  06/2010    Coronary calcium score 12.  Mild nonobstructive disease.  Ostial LAD and first D1/RI.  Right dominant.  Focal scarring and bronchiectasis of medial right lower lobe.   CYST EXCISION Left 08/08/2021   Procedure: EXCISION LEFT UPPER BACK SEBACEOUS CYST X2;  Surgeon: Abigail Miyamoto, MD;  Location: [facility];  Service: General;  Laterality: Left;   LEEP     unknown date    NM MYOVIEW LTD  10/2010   Nonischemic.  Normal EF.   TUBAL LIGATION     bilateral 1989   UTERINE FIBROID SURGERY      Current Medications: No outpatient medications have been marked as taking for the 10/24/22 encounter (Appointment) with Marcelino Duster, PA.     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   Social Determinants of Health   Financial Resource Strain: Low Risk  (09/18/2021)   Overall Financial Resource Strain (CARDIA)    Difficulty of Paying Living Expenses: Not hard at all  Food Insecurity: No Food Insecurity (09/18/2021)   Hunger Vital Sign    Worried About Running Out of Food in the Last Year: Never true    Ran Out of Food in the Last Year: Never true  Transportation Needs: No Transportation Needs (09/18/2021)   PRAPARE - Administrator, Civil Service (Medical): No    Lack of Transportation (Non-Medical): No  Physical Activity: Sufficiently Active (09/18/2021)   Exercise Vital Sign    Days of Exercise per Week: 5 days    Minutes of Exercise per Session: 50 min  Stress: No Stress Concern Present (09/18/2021)   [employer] of Occupational Health - Occupational Stress Questionnaire    Feeling of Stress : Not at all  Social Connections: Moderately Integrated (09/18/2021)   Social Connection and Isolation Panel [NHANES]    Frequency of Communication with Friends and Family: More than three times a week    Frequency of Social Gatherings with Friends and Family: More than three times a week    Attends Religious Services: More than 4 times per year    Active Member of [employer] or Organizations: Yes    Attends Engineer, structural: More than 4 times per year    Marital Status: Divorced     Family History: The patient's ***family history includes AAA (abdominal aortic aneurysm) (age of onset: 15) in her father; Allergic rhinitis in her brother, brother, and sister; Anxiety disorder in her mother; CVA in her brother; Esophageal cancer in her maternal grandmother; Heart attack (age of onset: 81) in her mother; Heart disease  in her brother, father, and mother; Hyperlipidemia in her father and mother; Hypertension in her father and mother; Schizophrenia in her brother, son, and another family member; Valvular heart disease in her brother. There is no history of Diabetes, Colon cancer, or Breast cancer.  ROS:   Please see the history of present illness.    *** All other systems reviewed and are negative.  EKGs/Labs/Other Studies Reviewed:    The following studies were reviewed today: ***      Recent Labs: 07/09/2022: BUN 10; Creatinine, Ser 0.60; Hemoglobin 12.3; Platelets 216; Potassium 4.1; Sodium 139 09/13/2022: ALT 12  Recent Lipid Panel    Component Value Date/Time  CHOL 141 09/13/2022 1036   TRIG 109 09/13/2022 1036   HDL 54 09/13/2022 1036   CHOLHDL 2.6 09/13/2022 1036   CHOLHDL 3.8 08/28/2019 1048   VLDL 33.2 10/07/2018 1103   LDLCALC 67 09/13/2022 1036   LDLCALC 132 (H) 08/28/2019 1048   LDLDIRECT 161.6 06/03/2012 1043     Risk Assessment/Calculations:   {Does this patient have ATRIAL FIBRILLATION?:[phone]}  No BP recorded.  {Refresh Note OR Click here to enter BP  :1}***         Physical Exam:    VS:  There were no vitals taken for this visit.    Wt Readings from Last 3 Encounters:  10/03/22 211 lb 6 oz (95.9 kg)  09/28/22 207 lb (93.9 kg)  07/06/22 202 lb 12.8 oz (92 kg)     GEN: *** Well nourished, well developed in no acute distress HEENT: Normal NECK: No JVD; No carotid bruits LYMPHATICS: No lymphadenopathy CARDIAC: ***RRR, no murmurs, rubs, gallops RESPIRATORY:  Clear to auscultation without rales, wheezing or rhonchi  ABDOMEN: Soft, non-tender, non-distended MUSCULOSKELETAL:  No edema; No deformity  SKIN: Warm and dry NEUROLOGIC:  Alert and oriented x 3 PSYCHIATRIC:  Normal affect   ASSESSMENT:    No diagnosis found. PLAN:    In order of problems listed above:  ***      {Are you ordering a CV Procedure (e.g. stress test, cath, DCCV, TEE, etc)?   Press  F2        :[id number]}    Medication Adjustments/Labs and Tests Ordered: Current medicines are reviewed at length with the patient today.  Concerns regarding medicines are outlined above.  No orders of the defined types were placed in this encounter.  No orders of the defined types were placed in this encounter.   There are no Patient Instructions on file for this visit.   Signed, Marcelino Duster, [place]  10/16/2022 12:29 PM    [facility]

## 2022-10-24 ENCOUNTER — Ambulatory Visit: Payer: 59 | Attending: Nurse Practitioner | Admitting: Physician Assistant

## 2023-01-01 ENCOUNTER — Ambulatory Visit: Payer: 59 | Admitting: Internal Medicine

## 2023-01-02 ENCOUNTER — Ambulatory Visit: Payer: 59 | Admitting: Internal Medicine

## 2023-01-02 ENCOUNTER — Telehealth: Payer: Self-pay | Admitting: Cardiology

## 2023-01-02 MED ORDER — LOSARTAN POTASSIUM 100 MG PO TABS: 100.0000 mg | ORAL_TABLET | Freq: Every day | ORAL | 2 refills | Status: DC

## 2023-01-02 NOTE — Telephone Encounter (Signed)
Spoke with pt with Dr. Herbie Baltimore response. Pt will increase Losartan to 100mg  daily. She will continue to keep her blood pressure log and bring it to office visit schedule for 12/17 at 8:50am with Joni Reining, NP.

## 2023-01-02 NOTE — Telephone Encounter (Signed)
Have not seen the patient in several years.  If she is taking losartan 50 mg daily, lets increase to 100 mg daily, reduce salt intake, continue to follow.  Perhaps we can get her in to see an APP in the next week or 2.  She last saw Bernadene Person, NP.  If blood pressures look better with a higher dose of losartan, the dose can simply just be changed at that visit.   Bryan Lemma, MD

## 2023-01-02 NOTE — Telephone Encounter (Signed)
Called patient of Dr. Herbie Baltimore in response to scheduling message sent.   She reports higher BP readings since Thanksgiving & light headache. 176/87 171/83 168/84 161/79 - taken this morning  BP readings taken before she takes losartan 50mg  in AM.  BPs were taken between yesterday and today.   Prior to Thanksgiving she had not really been keeping track of BP. Started checking BP because of lightheadedness and occasional burning in chest. She also reports anxiety. She took ASA 81mg  this morning and her light headache resolved.   She said she had foods higher in salt/sodium over holiday weekend, which is aware can increase her BP.   Will send to MD/PharmD HTN team for review

## 2023-01-02 NOTE — Telephone Encounter (Signed)
  Per MyChart scheduling message:  Pt c/o BP issue: STAT if pt c/o blurred vision, one-sided weakness or slurred speech  1. What are your last 5 BP readings?   2. Are you having any other symptoms (ex. Dizziness, headache, blurred vision, passed out)?   3. What is your BP issue?    176/87 171/83 168/84 161/79 Light headache.

## 2023-01-07 ENCOUNTER — Encounter: Payer: Self-pay | Admitting: Family Medicine

## 2023-01-07 ENCOUNTER — Ambulatory Visit (INDEPENDENT_AMBULATORY_CARE_PROVIDER_SITE_OTHER): Payer: 59 | Admitting: Family Medicine

## 2023-01-07 VITALS — BP 120/70 | HR 69 | Temp 98.4°F | Resp 16 | Ht 65.0 in | Wt 206.4 lb

## 2023-01-07 DIAGNOSIS — Z2989 Encounter for other specified prophylactic measures: Secondary | ICD-10-CM

## 2023-01-07 DIAGNOSIS — I10 Essential (primary) hypertension: Secondary | ICD-10-CM | POA: Diagnosis not present

## 2023-01-07 MED ORDER — ACETAZOLAMIDE 125 MG PO TABS
ORAL_TABLET | ORAL | 0 refills | Status: DC
Start: 2023-01-07 — End: 2023-02-12

## 2023-01-07 NOTE — Assessment & Plan Note (Signed)
BP adequately controlled. Continue Losartan 100 mg daily. Low salt/DASH recommended. Eye exam is current.

## 2023-01-07 NOTE — Patient Instructions (Addendum)
A few things to remember from today's visit:  Altitude sickness preventative measures - Plan: acetaZOLAMIDE (DIAMOX) 125 MG tablet  Essential hypertension  If you need refills for medications you take chronically, please call your pharmacy. Do not use My Chart to request refills or for acute issues that need immediate attention. If you send a my chart message, it may take a few days to be addressed, specially if I am not in the office.  Please be sure medication list is accurate. If a new problem present, please set up appointment sooner than planned today.

## 2023-01-07 NOTE — Progress Notes (Signed)
HPI: [name] is a 72 y.o. female with a PMHx significant for aortic atherosclerosis, HTN, asthma, GERD, thyroid nodules, vaginal leukoplakia, anxiety, insomnia, HLD, and polyarthralgia, among some, who is here today for medication management before travel.  Last seen on 10/03/2022  Patient is traveling to [place] to see her son. She will be there for about a month. She is requesting a prescription for medication to prevent altitude sickness because the last time she went she had a problem. Last time she visited her son, while ascending in a taxi, she felt like she could not breath, no associated headache or cyanosis. Driver had to stop for a few minutes and symptoms improved when she started descending.City she is visiting is about 3000 Ft altitude.  HTN on Losartan 100 mg daily, dose was increased last visit. Denies severe/frequent headache, visual changes, chest pain, dyspnea, palpitation, claudication, focal weakness, or edema. Lab Results  Component Value Date   NA 139 07/09/2022   CL 105 07/09/2022   K 4.1 07/09/2022   CO2 23 07/09/2022   BUN 10 07/09/2022   CREATININE 0.60 07/09/2022   EGFR 96 07/09/2022   CALCIUM 8.9 07/09/2022   ALBUMIN 4.6 09/13/2022   GLUCOSE 87 07/09/2022   She reports no new problems today.   Review of Systems  Constitutional:  Negative for activity change, appetite change, chills and fever.  Respiratory:  Negative for cough and wheezing.   Gastrointestinal:  Negative for abdominal pain, nausea and vomiting.  Genitourinary:  Negative for decreased urine volume and hematuria.  Neurological:  Negative for syncope and facial asymmetry.  See other pertinent positives and negatives in HPI.  Current Outpatient Medications on File Prior to Visit  Medication Sig Dispense Refill   albuterol (VENTOLIN HFA) 108 (90 Base) MCG/ACT inhaler Inhale 2 puffs into the lungs every 6 (six) hours as needed. 8 g 1   atorvastatin (LIPITOR) 20 MG tablet Take  1 tablet (20 mg total) by mouth daily. 90 tablet 3   citalopram (CELEXA) 10 MG tablet Take 1 tablet (10 mg total) by mouth daily. 30 tablet 2   fluticasone (FLONASE) 50 MCG/ACT nasal spray Place 2 sprays into both nostrils daily. 16 g 5   fluticasone furoate-vilanterol (BREO ELLIPTA) 200-25 MCG/ACT AEPB With respiratory illness or flare up, start 1 puff daily for 1-2 weeks. 60 each 1   hydrOXYzine (ATARAX) 25 MG tablet Take 1-2 tablets (25-50 mg total) by mouth at bedtime as needed. 30 tablet 2   losartan (COZAAR) 100 MG tablet Take 1 tablet (100 mg total) by mouth daily. 30 tablet 2   No current facility-administered medications on file prior to visit.    Past Medical History:  Diagnosis Date   Allergic rhinitis    Anxiety and depression    Asthma    Constipation, chronic    COPD (chronic obstructive pulmonary disease) (HCC)    Coronary artery calcification seen on CAT scan 06/2010   Mild nonobstructive CAD on cardiac CT  -as of 2021, coronary calcium score 34   Dyspnea    Hemorrhoids    Hip pain, bilateral    Hyperlipidemia    Hypertension    Leukoplakia of vagina    Low back pain    w/ left side radiculopathy, s/p injection at L4-L5 forarmen 1/06   Uterine fibroid    Allergies  Allergen Reactions   No Healthtouch Food Allergies Other (See Comments)    Cheese-elevated blood pressure   Peanut-Containing Drug  Products Other (See Comments)    headache    Social History   Socioeconomic History   Marital status: Divorced    Spouse name: Not on file   Number of children: 3   Years of education: Not on file   Highest education level: Not on file  Occupational History   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   Social Determinants of Health   Financial Resource Strain: Low Risk  (09/18/2021)   Overall Financial Resource Strain (CARDIA)    Difficulty of Paying Living Expenses: Not hard at all  Food Insecurity: No Food Insecurity (09/18/2021)   Hunger Vital Sign    Worried About Running Out of Food in the Last Year: Never true    Ran Out of Food in the Last Year: Never true  Transportation Needs: No Transportation Needs (09/18/2021)   PRAPARE - Administrator, Civil Service (Medical): No    Lack of Transportation (Non-Medical): No  Physical Activity: Sufficiently Active (09/18/2021)   Exercise Vital Sign    Days of Exercise per Week: 5 days    Minutes of Exercise per Session: 50 min  Stress: No Stress Concern Present (09/18/2021)   [employer] of Occupational Health - Occupational Stress Questionnaire    Feeling of Stress : Not at all  Social Connections: Moderately Integrated (09/18/2021)   Social Connection and Isolation Panel [NHANES]    Frequency of Communication with Friends and Family: More than three times a week    Frequency of Social Gatherings with Friends and Family: More than three times a week    Attends Religious Services: More than 4 times per year    Active Member of Clubs or Organizations: Yes    Attends Banker Meetings: More than 4 times per year    Marital Status: Divorced    Vitals:   01/07/23 0949  BP: 120/70  Pulse: 69  Resp: 16  Temp: 98.4 F (36.9 C)  SpO2: 96%   Body mass index is 34.34 kg/m.  Physical Exam Vitals and nursing note reviewed.  Constitutional:      General: She is not in acute distress.    Appearance: She is  well-developed.  HENT:     Head: Normocephalic and atraumatic.  Eyes:     Conjunctiva/sclera: Conjunctivae normal.  Cardiovascular:     Rate and Rhythm: Normal rate and regular rhythm.     Pulses:          Dorsalis pedis pulses are 2+ on the right side and 2+ on the left side.     Heart sounds: No murmur heard. Pulmonary:     Effort: Pulmonary effort is normal. No respiratory distress.     Breath sounds: Normal breath sounds.  Abdominal:     Palpations: Abdomen is soft. There is no mass.  Tenderness: There is no abdominal tenderness.  Musculoskeletal:     Right lower leg: No edema.     Left lower leg: No edema.  Skin:    General: Skin is warm.     Findings: No erythema or rash.  Neurological:     General: No focal deficit present.     Mental Status: She is alert and oriented to person, place, and time.     Cranial Nerves: No cranial nerve deficit.     Gait: Gait normal.  Psychiatric:        Mood and Affect: Mood and affect normal.   ASSESSMENT AND PLAN:  Ms. Maltos was seen today for medication management.  Altitude sickness preventative measures We discussed general recommendations for prevention. Acetazolamide 125 mg bid to start date she is going to start ascending and for max 7 days one she stop doing so. Some side effects discussed.  -     acetaZOLAMIDE; For prevention of altitude sickness to start before ascending and can be stop when at same altitude for 4-5 days and starting descend.  Dispense: 20 tablet; Refill: 0  Essential hypertension Assessment & Plan: BP adequately controlled. Continue Losartan 100 mg daily. Low salt/DASH recommended. Eye exam is current.  Return if symptoms worsen or fail to improve, for keep next appointment.  I, Rolla Etienne Wierda, acting as a scribe for Jaren Kearn [place], MD., have documented all relevant documentation on the behalf of Peityn Payton [place], MD, as directed by  Kelly Ranieri [place], MD while in the presence of Mohammad Granade [place], MD.   I,  Ercia Crisafulli [place], MD, have reviewed all documentation for this visit. The documentation on 01/07/23 for the exam, diagnosis, procedures, and orders are all accurate and complete.  Ardel Jagger G. [place], MD  [facility]. [place] office.

## 2023-01-10 NOTE — Progress Notes (Deleted)
  Cardiology Office Note:  .   Date:  01/10/2023  ID:  Veronica [name], DOB [birth year], MRN [id number] PCP: [place], Betty G, MD  [facility] Providers Cardiologist:  Bryan Lemma, MD  }   History of Present Illness: .   Veronica [name] is a 72 y.o. female with mild nonobstructive CAD, hypertension, hyperlipidemia, anxiety, and aspirin.  Last seen in the office on 07/06/2022 by Bernadene Person, DNP. Repeat coronary CTA in February 2023 in the setting of ongoing chest pain and exertional dyspnea revealed calcium score 28.4 (70 percentile for age/sex), nonobstructive CAD.  Echocardiogram showed EF 55 to 60%, G1 DD, aortic valve sclerosis without evidence of stenosis.  At that time amlodipine was discontinued.  She called our office on 01/02/2019 for due to elevated blood pressure.  Losartan was increased to 100 mg daily and she is here for follow-up to evaluate her response to medication.  ROS: ***  Studies Reviewed: Marland Kitchen    Coronary CTA 03/10/2021 IMPRESSION: 1. Calcium Score 28.4 which is 70 th percentile for age/sex   2.  Normal ascending thoracic aorta 3.3 cm   3.  CAD RADS 1 non obstructive CAD see description above  *** EKG Interpretation Date/Time:    Ventricular Rate:    PR Interval:    QRS Duration:    QT Interval:    QTC Calculation:   R Axis:      Text Interpretation:      Physical Exam:   VS:  There were no vitals taken for this visit.   Wt Readings from Last 3 Encounters:  01/07/23 206 lb 6 oz (93.6 kg)  10/03/22 211 lb 6 oz (95.9 kg)  09/28/22 207 lb (93.9 kg)    GEN: Well nourished, well developed in no acute distress NECK: No JVD; No carotid bruits CARDIAC: ***RRR, no murmurs, rubs, gallops RESPIRATORY:  Clear to auscultation without rales, wheezing or rhonchi  ABDOMEN: Soft, non-tender, non-distended EXTREMITIES:  No edema; No deformity   ASSESSMENT AND PLAN: .   ***    {Are you ordering a CV Procedure (e.g. stress test, cath, DCCV, TEE, etc)?   Press F2         :[id number]}    Signed, Bettey Mare. Liborio Nixon, ANP, AACC

## 2023-01-15 ENCOUNTER — Ambulatory Visit: Payer: 59 | Attending: Adult Health | Admitting: Adult Health

## 2023-02-03 ENCOUNTER — Other Ambulatory Visit: Payer: Self-pay | Admitting: Family Medicine

## 2023-02-03 DIAGNOSIS — F411 Generalized anxiety disorder: Secondary | ICD-10-CM

## 2023-02-09 ENCOUNTER — Other Ambulatory Visit: Payer: Self-pay | Admitting: Family Medicine

## 2023-02-09 DIAGNOSIS — F411 Generalized anxiety disorder: Secondary | ICD-10-CM

## 2023-02-12 ENCOUNTER — Encounter: Payer: Self-pay | Admitting: Family Medicine

## 2023-02-12 ENCOUNTER — Ambulatory Visit (INDEPENDENT_AMBULATORY_CARE_PROVIDER_SITE_OTHER): Payer: 59 | Admitting: Family Medicine

## 2023-02-12 VITALS — BP 120/70 | HR 76 | Resp 16 | Ht 65.0 in | Wt 208.0 lb

## 2023-02-12 DIAGNOSIS — L282 Other prurigo: Secondary | ICD-10-CM | POA: Diagnosis not present

## 2023-02-12 DIAGNOSIS — F411 Generalized anxiety disorder: Secondary | ICD-10-CM

## 2023-02-12 DIAGNOSIS — B86 Scabies: Secondary | ICD-10-CM

## 2023-02-12 MED ORDER — HYDROXYZINE HCL 25 MG PO TABS
25.0000 mg | ORAL_TABLET | Freq: Every evening | ORAL | 2 refills | Status: DC | PRN
Start: 2023-02-12 — End: 2023-05-27

## 2023-02-12 MED ORDER — PERMETHRIN 5 % EX CREA
TOPICAL_CREAM | CUTANEOUS | 1 refills | Status: DC
Start: 2023-02-12 — End: 2023-06-20

## 2023-02-12 NOTE — Patient Instructions (Addendum)
 A few things to remember from today's visit:  Pruritic rash  Generalized anxiety disorder - Plan: hydrOXYzine  (ATARAX ) 25 MG tablet  Scabies - Plan: permethrin  (ELIMITE ) 5 % cream  If you need refills for medications you take chronically, please call your pharmacy. Do not use My Chart to request refills or for acute issues that need immediate attention. If you send a my chart message, it may take a few days to be addressed, specially if I am not in the office.  Please be sure medication list is accurate. If a new problem present, please set up appointment sooner than planned today.

## 2023-02-12 NOTE — Progress Notes (Signed)
 ACUTE VISIT Chief Complaint  Patient presents with   itchy skin    Back area   HPI: [name] is a 73 y.o. female with a PMHx significant for aortic atherosclerosis, HTN, asthma, GERD, thyroid  nodules, vaginal leukoplakia, anxiety, insomnia, HLD, and polyarthralgia, among some, who is here today complaining of skin rash.  Rash:  Patient complains of a pruritic rash one her arms, back, groin, and abdomen. She is not having any rash on her palms or soles. Rash started after visiting her son in [place]. She mentions her son has had a similar problem recently.  She has tried several OTCs which have provided temporary relief but have not resolved the problem.  It is very pruritic, specially at night. No new medication, outdoors exposure,or insect bite.  She has not taken any OTC antihistamines.  She denies fever, body aches, or recent respiratory illness.   Insomnia and anxiety:  Currently on hydroxyzine  25 mg 1-2 tables nightly prn for anxiety. She is also on citalopram  10 mg daily.  She mentions she is still having some difficulty sleeping, and would like to have her hydroxyzine  dosage increased.  + Stress.  Review of Systems  Constitutional:  Positive for fatigue. Negative for activity change, appetite change and chills.  HENT:  Negative for mouth sores and sore throat.   Respiratory:  Negative for cough, shortness of breath and wheezing.   Gastrointestinal:  Negative for abdominal pain, nausea and vomiting.  Allergic/Immunologic: Positive for environmental allergies.  Neurological:  Negative for syncope, weakness, numbness and headaches.  Psychiatric/Behavioral:  Positive for sleep disturbance. Negative for confusion and hallucinations. The patient is nervous/anxious.   See other pertinent positives and negatives in HPI.  Current Outpatient Medications on File Prior to Visit  Medication Sig Dispense Refill   albuterol  (VENTOLIN  HFA) 108 (90 Base) MCG/ACT inhaler  Inhale 2 puffs into the lungs every 6 (six) hours as needed. 8 g 1   atorvastatin  (LIPITOR) 20 MG tablet Take 1 tablet (20 mg total) by mouth daily. 90 tablet 3   citalopram  (CELEXA ) 10 MG tablet TAKE 1 TABLET BY MOUTH EVERY DAY 30 tablet 2   fluticasone  (FLONASE ) 50 MCG/ACT nasal spray Place 2 sprays into both nostrils daily. 16 g 5   fluticasone  furoate-vilanterol (BREO ELLIPTA ) 200-25 MCG/ACT AEPB With respiratory illness or flare up, start 1 puff daily for 1-2 weeks. 60 each 1   losartan  (COZAAR ) 100 MG tablet Take 1 tablet (100 mg total) by mouth daily. 30 tablet 2   No current facility-administered medications on file prior to visit.   Past Medical History:  Diagnosis Date   Allergic rhinitis    Anxiety and depression    Asthma    Constipation, chronic    COPD (chronic obstructive pulmonary disease) (HCC)    Coronary artery calcification seen on CAT scan 06/2010   Mild nonobstructive CAD on cardiac CT  -as of 2021, coronary calcium  score 34   Dyspnea    Hemorrhoids    Hip pain, bilateral    Hyperlipidemia    Hypertension    Leukoplakia of vagina    Low back pain    w/ left side radiculopathy, s/p injection at L4-L5 forarmen 1/06   Uterine fibroid    Allergies  Allergen Reactions   No [facility] Food Allergies Other (See Comments)    Cheese-elevated blood pressure   Peanut-Containing Drug Products Other (See Comments)    headache    Social History   Socioeconomic History  Marital status: Divorced    Spouse name: Not on file   Number of children: 3   Years of education: Not on file   Highest education level: Not on file  Occupational History   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   Social Drivers of Corporate Investment Banker Strain: Low Risk  (09/18/2021)   Overall Financial Resource Strain (CARDIA)    Difficulty of Paying Living Expenses: Not hard at all  Food Insecurity: No Food Insecurity (09/18/2021)   Hunger Vital Sign    Worried About Running Out of Food in the Last Year: Never true    Ran Out of Food in the Last Year: Never true  Transportation Needs: No Transportation Needs (09/18/2021)   PRAPARE - Administrator, Civil Service (Medical): No    Lack of Transportation (Non-Medical): No  Physical Activity: Sufficiently Active (09/18/2021)   Exercise Vital Sign    Days of Exercise per Week: 5 days    Minutes of Exercise per Session: 50 min  Stress: No Stress Concern Present (09/18/2021)   [employer] of Occupational Health - Occupational Stress Questionnaire    Feeling of Stress : Not at all  Social Connections: Moderately Integrated (09/18/2021)   Social Connection and Isolation Panel [NHANES]    Frequency of Communication with Friends and Family: More than three times a week    Frequency of Social Gatherings with Friends and Family: More than three times a week    Attends Religious Services: More than 4 times per year    Active Member of [employer] or Organizations: Yes    Attends Banker Meetings: More than 4 times per year    Marital Status: Divorced    Vitals:   02/12/23 0959  BP: 120/70  Pulse: 76  Resp: 16  SpO2: 98%   Body mass index is 34.61 kg/m.  Physical Exam Vitals and nursing note reviewed.  Constitutional:      General: She is not in acute distress.    Appearance: She is well-developed.  HENT:     Head: Normocephalic  and atraumatic.     Mouth/Throat:     Mouth: Mucous membranes are moist.     Pharynx: Oropharynx is clear. Uvula midline.  Eyes:     Conjunctiva/sclera: Conjunctivae normal.  Cardiovascular:     Rate and Rhythm: Normal rate and regular rhythm.     Heart sounds: No murmur heard. Pulmonary:     Effort: Pulmonary effort is normal. No respiratory distress.     Breath sounds: Normal breath sounds.  Skin:    General: Skin is warm.     Findings: Rash present. No erythema.     Comments: Micropapular erythematous rash affecting areas around wrist, lower back, lower abdomen, right groin, and under breast.  Neurological:  General: No focal deficit present.     Mental Status: She is alert and oriented to person, place, and time.     Cranial Nerves: No cranial nerve deficit.     Gait: Gait normal.  Psychiatric:        Mood and Affect: Affect normal. Mood is anxious.   ASSESSMENT AND PLAN:  Ms. Dalporto was seen today for itchy skin.   Pruritic rash We discussed differential Dx's. Zyrtec 10 mg in the morning recommended. If it does not resolved, dermatology referral will be considered.  Scabies We discussed Dx,prognosis,and treatment options. Her son is seeing his PCP today for similar rash. Permethrin  from neck down at night and rinse after 8 hours. Can repeat treatment in a week if new lesions present. Instructed how to treat clothing, education on AVS.  -     Permethrin ; Apply from the neck down at bedtime and rinse after 8 hours.  May repeat in 14 days if symptoms persist.  Dispense: 60 g; Refill: 1 Generalized anxiety disorder Mildly aggravated recently due to son's medical problems. Continue Citalopram  10 mg daily. She takes Hydroxyzine  25 mg daily as needed , usually at bedtime to help her sleep. She can take up to 50 mg daily, side effects discussed.  -     hydrOXYzine  HCl; Take 1-2 tablets (25-50 mg total) by mouth at bedtime as needed.  Dispense: 60 tablet; Refill:  2  Return if symptoms worsen or fail to improve, for keep next appointment.  I, Leonce PARAS Wierda, acting as a scribe for Jyron Turman, MD., have documented all relevant documentation on the behalf of Coby Shrewsberry, MD, as directed by  Nirel Babler, MD while in the presence of Maziyah Vessel, MD.   I, Willie Loy, MD, have reviewed all documentation for this visit. The documentation on 02/12/23 for the exam, diagnosis, procedures, and orders are all accurate and complete.  Taraya Steward G. Deatrice Spanbauer, MD  [facility] [place] office.

## 2023-03-01 NOTE — Progress Notes (Deleted)
 Cardiology Office Note:    Date:  03/01/2023   ID:  Yosselin, Zoeller 11/06/1950, MRN [id number]  PCP:  [place], Betty G, MD  Cardiologist:  Bryan Lemma, MD { Click to update primary MD,subspecialty MD or APP then REFRESH:1}    Referring MD: [place], Betty G, MD   Chief Complaint: follow-up of non-obstructive CAD and dizziness  History of Present Illness:    SHANETTE TAMARGO is a 73 y.o. female with a history of mild non-obstructive CAD on coronary CTA in 2012 and again in 03/2021, hypertension, hyperlipidemia, asthma, and anxiety who is followed by Dr. Herbie Baltimore and presents today for routine follow-up visit.   Patient was referred to Dr. Herbie Baltimore in 11/2018 to establish care with Cardiology given family history of CAD. A prior coronary CTA in 2012 showed a coronary calcium score of 12 with mild non-obstructive CAD. She denied any chest pain or dyspnea at that visit. A coronary calcium score was ordered and came back at 34 (68th percentile for age and sex). A 2 week Zio monitor was ordered in 10/2019 for further evaluation of palpitations and showed underlying sinus rhythm with rare PACs/PVCs and a short run of paroxysmal atrial tachycardia but no sustained arrhythmias.  Repeat coronary coronary CTA in 03/2021 for further evaluation of chest pain showed a coronary calcium score of 28.4 (70th percentile for age and sex) and mild non-obstructive CAD (at most 1-24%). Echo showed LVEF of 55-60% with normal wall motion and grade 1 diastolic dysfunction, normal RV size and function, and no significant valvular disease.   She was last seen by Bernadene Person, NP, in 06/2022 at which time she was doing relatively well with no chest pain or shortness of breath. However, her BP had been running somewhat low and she reported persistent dizziness in the evenings and upon waking in morning. She also reported rare intermittent palpations. Amlodipine was stopped. Carotid dopplers were ordered for further evaluation of  dizziness and were normal.  Patient presents today for follow-up. ***  Non-Obstructive CAD History of mild non-obstructive CAD noted on coronary CTA in 2012 and again in 03/2021.  - *** - Continue statin. - She has not been on Aspirin given only minimal disease.  Dizziness  ***  Palpitations Monitor in 10/2019 showed rare PACs/PVCs and short run of PAT but no sustained arrhythmias.  - Stable. ***  Hypertension BP well controlled. *** - Continue Losartan ***.  Hyperlipidemia Lipid panel in 08/2022: Total Cholesterol 141, Triglycerides 109, HDL 54, LDL 67. - Continue Lipitor 20mg  daily.  Obesity  BMI ***  EKGs/Labs/Other Studies Reviewed:    The following studies were reviewed:  Monitor 11/27/2019 to 12/10/2019: Sinus rhythm: Minimum heart rate 55 bpm, maximum 126 bpm with an average of 73 bpm. Rare (< 1%) isolated PACs and PVCs with the very rare PAC couplets. 1 PAC quadruplet-officially short burst of PAT-rate 148 bpm with an average rate of 124 bpm. No sustained arrhythmias or significant bradycardia. _______________  Echocardiogram 03/08/2021: Impressions: 1. Left ventricular ejection fraction, by estimation, is 55 to 60%. The  left ventricle has normal function. The left ventricle has no regional  wall motion abnormalities. Left ventricular diastolic parameters are  consistent with Grade I diastolic  dysfunction (impaired relaxation).   2. Right ventricular systolic function is normal. The right ventricular  size is normal. There is normal pulmonary artery systolic pressure. The  estimated right ventricular systolic pressure is 25.7 mmHg.   3. The mitral valve is normal in  structure. Trivial mitral valve  regurgitation. No evidence of mitral stenosis.   4. The aortic valve is tricuspid. Aortic valve regurgitation is trivial.  Aortic valve sclerosis/calcification is present, without any evidence of  aortic stenosis.   5. The inferior vena cava is normal in size  with greater than 50%  respiratory variability, suggesting right atrial pressure of 3 mmHg.  _______________  Coronary CTA 03/10/2021: Impressions: 1. Calcium Score 28.4 which is 70 th percentile for age/sex 2.  Normal ascending thoracic aorta 3.3 cm 3.  CAD RADS 1 non obstructive CAD see description above _______________  Carotid Dopplers 07/24/2022: Summary:  - Right Carotid: There is no evidence of stenosis in the right ICA. There was no evidence of thrombus, dissection, atherosclerotic plaque or stenosis in the cervical carotid system.  - Left Carotid: There is no evidence of stenosis in the left ICA. There was no evidence of thrombus, dissection, atherosclerotic plaque or stenosis in the cervical carotid system.  - Vertebrals:  Atypical antegrade flow in the bilateral vertebral arteries.  - Subclavians: Right subclavian artery flow was disturbed. Normal flow hemodynamics were seen in the left subclavian artery.    EKG:  EKG not ordered today.   Recent Labs: 07/09/2022: BUN 10; Creatinine, Ser 0.60; Hemoglobin 12.3; Platelets 216; Potassium 4.1; Sodium 139 09/13/2022: ALT 12  Recent Lipid Panel    Component Value Date/Time   CHOL 141 09/13/2022 1036   TRIG 109 09/13/2022 1036   HDL 54 09/13/2022 1036   CHOLHDL 2.6 09/13/2022 1036   CHOLHDL 3.8 08/28/2019 1048   VLDL 33.2 10/07/2018 1103   LDLCALC 67 09/13/2022 1036   LDLCALC 132 (H) 08/28/2019 1048   LDLDIRECT 161.6 06/03/2012 1043    Physical Exam:    Vital Signs: There were no vitals taken for this visit.    Wt Readings from Last 3 Encounters:  02/12/23 208 lb (94.3 kg)  01/07/23 206 lb 6 oz (93.6 kg)  10/03/22 211 lb 6 oz (95.9 kg)     General: 73 y.o. female in no acute distress. HEENT: Normocephalic and atraumatic. Sclera clear.  Neck: Supple. No carotid bruits. No JVD. Heart: *** RRR. Distinct S1 and S2. No murmurs, gallops, or rubs.  Lungs: No increased work of breathing. Clear to ausculation bilaterally. No  wheezes, rhonchi, or rales.  Abdomen: Soft, non-distended, and non-tender to palpation.  Extremities: No lower extremity edema.  Radial and distal pedal pulses 2+ and equal bilaterally. Skin: Warm and dry. Neuro: No focal deficits. Psych: Normal affect. Responds appropriately.   Assessment:    No diagnosis found.  Plan:     Disposition: Follow up in ***   Signed, Corrin Parker, PA-C  03/01/2023 10:15 AM    [facility]

## 2023-03-04 ENCOUNTER — Ambulatory Visit: Payer: 59 | Admitting: Student

## 2023-03-16 ENCOUNTER — Other Ambulatory Visit: Payer: Self-pay | Admitting: Nurse Practitioner

## 2023-03-20 ENCOUNTER — Ambulatory Visit: Payer: Self-pay | Admitting: Family Medicine

## 2023-03-20 NOTE — Telephone Encounter (Signed)
Copied From CRM [id number]. Reason for Triage: Patient has been having headache and pain In  her left shoulder.    Chief Complaint: Left shoulder pain Symptoms: Left shoulder pain Frequency: Constant  Pertinent Negatives: Patient denies chest pain or shortness of breath  Disposition: [] ED /[] Urgent Care (no appt availability in office) / [x] Appointment(In office/virtual)/ []  [facility]/ [] Home Care/ [] Refused Recommended Disposition /[] [facility]/ []  Follow-up with PCP Additional Notes: Patient reports she has been experiencing left shoulder pain for the last 3 days. She states her pain is constant and is exacerbated when she is using the arm. She denies any chest pain or shortness of breath. Appointment made for the patient tomorrow. Patient instructed to call back for new or worsening symptoms. Patient verbalized understanding and agreement with this plan.     Reason for Disposition  [1] MODERATE pain (e.g., interferes with normal activities) AND [2] present > 3 days  Answer Assessment - Initial Assessment Questions 1. ONSET: "When did the pain start?"     3 day  2. LOCATION: "Where is the pain located?"     Left shoulder/armpit 3. PAIN: "How bad is the pain?" (Scale 1-10; or mild, moderate, severe)   - MILD (1-3): doesn't interfere with normal activities   - MODERATE (4-7): interferes with normal activities (e.g., work or school) or awakens from sleep   - SEVERE (8-10): excruciating pain, unable to do any normal activities, unable to move arm at all due to pain     5/10 4. WORK OR EXERCISE: "Has there been any recent work or exercise that involved this part of the body?"     No 5. CAUSE: "What do you think is causing the shoulder pain?"     Unsure 6. OTHER SYMPTOMS: "Do you have any other symptoms?" (e.g., neck pain, swelling, rash, fever, numbness, weakness)     Intermittent headaches  Protocols used: Shoulder Pain-A-AH

## 2023-03-21 ENCOUNTER — Encounter: Payer: Self-pay | Admitting: Internal Medicine

## 2023-03-21 ENCOUNTER — Ambulatory Visit (INDEPENDENT_AMBULATORY_CARE_PROVIDER_SITE_OTHER): Payer: 59 | Admitting: Internal Medicine

## 2023-03-21 VITALS — BP 148/68 | HR 63 | Temp 98.1°F | Ht 65.0 in | Wt 215.4 lb

## 2023-03-21 DIAGNOSIS — I10 Essential (primary) hypertension: Secondary | ICD-10-CM

## 2023-03-21 DIAGNOSIS — M25512 Pain in left shoulder: Secondary | ICD-10-CM | POA: Diagnosis not present

## 2023-03-21 DIAGNOSIS — E785 Hyperlipidemia, unspecified: Secondary | ICD-10-CM | POA: Diagnosis not present

## 2023-03-21 LAB — LIPID PANEL
Cholesterol: 149 mg/dL (ref 0–200)
HDL: 49.2 mg/dL (ref >39.00)
LDL Cholesterol: 71 mg/dL (ref 0–99)
NonHDL: 100.25
Total CHOL/HDL Ratio: 3
Triglycerides: 147 mg/dL (ref 0.0–149.0)
VLDL: 29.4 mg/dL (ref 0.0–40.0)

## 2023-03-21 LAB — BASIC METABOLIC PANEL
BUN: 13 mg/dL (ref 6–23)
CO2: 27 meq/L (ref 19–32)
Calcium: 9.2 mg/dL (ref 8.4–10.5)
Chloride: 104 meq/L (ref 96–112)
Creatinine, Ser: 0.65 mg/dL (ref 0.40–1.20)
GFR: 88.8 mL/min (ref >60.00)
Glucose, Bld: 91 mg/dL (ref 70–99)
Potassium: 4.1 meq/L (ref 3.5–5.1)
Sodium: 138 meq/L (ref 135–145)

## 2023-03-21 MED ORDER — MELOXICAM 7.5 MG PO TABS: 7.5000 mg | ORAL_TABLET | Freq: Every day | ORAL | 0 refills | Status: AC

## 2023-03-21 NOTE — Telephone Encounter (Signed)
Pt was seen today with Dr. Fabian Sharp.

## 2023-03-21 NOTE — Patient Instructions (Addendum)
I think this a shoulder  problem arthritis or busritis .  Usually use an antiinflammatory   for short term  for pain and inflammation. If not helping suggst x ray and or further evaluation  PCP  or  ortho .   Sending in mobic could help but not with asa .    Plan fu with with PCP in 2 weeks if not a lot better .

## 2023-03-21 NOTE — Progress Notes (Signed)
Chief Complaint  Patient presents with   Shoulder Pain    Pt c/o L shoulder pain. Going on for 4 days. Pt states she has family issues and is stress. no SOB. Pt reports she is having neck pain on the back. Pain toward left breast. Pt states baby aspirin 81mg  helps with the pain.    Stress    HPI: Veronica [name] 73 y.o. come in for  left arm  and back of neck  down arm to elvow   and pressure in chest.  Insidious onset   no injyr some tomes was wit left neck a area    Took asa and noted helped  exp with ha  down   pain persistent 5/10  no weakness   no hx of same  some upper left chest tigt feeling but no  sob assoc sx .  Used friends patch and helped a lot .  Little nausea?  Also would like to ceck fld to see how doing  ROS: See pertinent positives and negatives per HPI.  Past Medical History:  Diagnosis Date   Allergic rhinitis    Anxiety and depression    Asthma    Constipation, chronic    COPD (chronic obstructive pulmonary disease) (HCC)    Coronary artery calcification seen on CAT scan 06/2010   Mild nonobstructive CAD on cardiac CT  -as of 2021, coronary calcium score 34   Dyspnea    Hemorrhoids    Hip pain, bilateral    Hyperlipidemia    Hypertension    Leukoplakia of vagina    Low back pain    w/ left side radiculopathy, s/p injection at L4-L5 forarmen 1/06   Uterine fibroid     Family History  Problem Relation Age of Onset   Hypertension Mother    Hyperlipidemia Mother    Heart attack Mother 38   Heart disease Mother    Anxiety disorder Mother    Hypertension Father    Hyperlipidemia Father    AAA (abdominal aortic aneurysm) Father 38   Heart disease Father    Allergic rhinitis Sister    Allergic rhinitis Brother    Schizophrenia Brother    CVA Brother    Valvular heart disease Brother        Valve Surgery   Allergic rhinitis Brother    Heart disease Brother        Vavle surgery   Esophageal cancer Maternal Grandmother    Schizophrenia Son     Schizophrenia Other        2 nieces   Diabetes Neg Hx    Colon cancer Neg Hx    Breast cancer Neg Hx     Social History   Socioeconomic History   Marital status: Divorced    Spouse name: Not on file   Number of children: 3   Years of education: Not on file   Highest education level: Not on file  Occupational History   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   Social Drivers of Corporate investment banker Strain: Low Risk  (09/18/2021)   Overall Financial Resource Strain (CARDIA)    Difficulty of Paying Living Expenses: Not hard at all  Food Insecurity: No Food Insecurity (09/18/2021)   Hunger Vital Sign    Worried About Running Out of Food in the Last Year: Never true    Ran Out of Food in the Last Year: Never true  Transportation Needs: No Transportation Needs (09/18/2021)   PRAPARE - Administrator, Civil Service (Medical): No    Lack of Transportation (Non-Medical): No  Physical Activity: Sufficiently Active (09/18/2021)   Exercise Vital Sign    Days of Exercise per Week: 5 days    Minutes of Exercise per Session: 50 min  Stress: No Stress Concern Present (09/18/2021)   [employer] of Occupational Health - Occupational Stress Questionnaire    Feeling of Stress : Not at all  Social Connections: Moderately Integrated (09/18/2021)   Social Connection and Isolation Panel [NHANES]    Frequency of  Communication with Friends and Family: More than three times a week    Frequency of Social Gatherings with Friends and Family: More than three times a week    Attends Religious Services: More than 4 times per year    Active Member of [employer] or Organizations: Yes    Attends Engineer, structural: More than 4 times per year    Marital Status: Divorced    Outpatient Medications Prior to Visit  Medication Sig Dispense Refill   atorvastatin (LIPITOR) 20 MG tablet Take 1 tablet (20 mg total) by mouth daily. 90 tablet 3   citalopram (CELEXA) 10 MG tablet TAKE 1 TABLET BY MOUTH EVERY DAY 30 tablet 2   hydrOXYzine (ATARAX) 25 MG tablet Take 1-2 tablets (25-50 mg total) by mouth at bedtime as needed. 60 tablet 2   losartan (COZAAR) 100 MG tablet Take 1 tablet (100 mg total) by mouth daily. 30 tablet 2   albuterol (VENTOLIN HFA) 108 (90 Base) MCG/ACT inhaler Inhale 2 puffs into the lungs every 6 (six) hours as needed. (Patient not taking: Reported on 03/21/2023) 8 g 1   fluticasone (FLONASE) 50 MCG/ACT nasal spray Place 2 sprays into both nostrils daily. (Patient not taking: Reported on 03/21/2023) 16 g 5   fluticasone furoate-vilanterol (BREO ELLIPTA) 200-25 MCG/ACT AEPB With respiratory illness or flare up, start 1 puff daily for 1-2 weeks. (Patient not taking: Reported on 03/21/2023) 60 each 1   permethrin (ELIMITE) 5 % cream Apply from the neck down at bedtime and rinse after 8 hours.  May repeat in 14 days if symptoms persist. (Patient not taking: Reported on 03/21/2023) 60 g 1   No facility-administered medications prior to visit.     EXAM:  BP (!) 148/68 (BP Location: Right Arm, Patient Position: Sitting, Cuff Size: Large)   Pulse 63   Temp 98.1 F (36.7 C) (Oral)   Ht 5\' 5"  (1.651 m)   Wt 215 lb 6.4 oz (97.7 kg)   SpO2 96%   BMI 35.84 kg/m   Body mass index is 35.84 kg/m.  GENERAL: vitals reviewed and listed above, alert, oriented, appears well hydrated and in no acute  distress HEENT: atraumatic, conjunctiva  clear, no obvious abnormalities on inspection of external nose and ears OP :  no lesion edema or exudate  NECK: no obvious masses on inspection palpation  no midline tenderness  LUNGS: clear to auscultation bilaterally, no wheezes, rales or rhonchi, good air movement CV: HRRR, no clubbing cyanosis or  peripheral edema nl cap refill  MS: moves all extremities without noticeable focal  abnormality   dec rom left shoulder  discomfort elevation and internal rotataion   no weakness  noted   some trapezius   tightness? No masses  PSYCH: pleasant and cooperative, no obvious depression or anxiety Lab Results  Component Value Date   WBC 3.9 07/09/2022   HGB 12.3 07/09/2022   HCT 37.2 07/09/2022   PLT 216 07/09/2022   GLUCOSE 87 07/09/2022   CHOL 141 09/13/2022   TRIG 109 09/13/2022   HDL 54 09/13/2022   LDLDIRECT 161.6 06/03/2012   LDLCALC 67 09/13/2022   ALT 12 09/13/2022   AST 20 09/13/2022   NA 139 07/09/2022   K 4.1 07/09/2022   CL 105 07/09/2022   CREATININE 0.60 07/09/2022   BUN 10 07/09/2022   CO2 23 07/09/2022   TSH 0.66 09/08/2021   INR 1.07 10/08/2010   HGBA1C 5.6 10/13/2020   BP Readings from Last 3 Encounters:  03/21/23 (!) 148/68  02/12/23 120/70  01/07/23 120/70    ASSESSMENT AND PLAN:  Discussed the following assessment and plan:  Acute pain of left shoulder  Essential hypertension - Plan: Lipid panel, Basic metabolic panel  Hyperlipidemia with target LDL less than 100 - Plan: Lipid panel, Basic metabolic panel  Consistent with mechanical pain    shoulder has dec rom and neck radiation could be    assoc with cervical radical but less likely  Also not cw  nor typcal of vascular cause   She requested update on lipid fasting today so ordered   and will share info with her PCP Don't take asa with  mobic . ( No x ray in office today  ut no injury so no completing  reason to seek elsewhere  -Patient advised to return or  notify health care team  if  new concerns arise.  Patient Instructions  I think this a shoulder  problem arthritis or busritis .  Usually use an antiinflammatory   for short term  for pain and inflammation. If not helping suggst x ray and or further evaluation  PCP  or  ortho .   Sending in mobic could help but not with asa .    Plan fu with with PCP in 2 weeks if not a lot better .    Neta Mends. Alfonso Shackett M.D.

## 2023-03-22 ENCOUNTER — Encounter: Payer: Self-pay | Admitting: Internal Medicine

## 2023-03-22 NOTE — Progress Notes (Signed)
Ldl is 71  . Blood sugar and kidney function are normal. Forwarding info to Dr [place] fyi.

## 2023-04-04 ENCOUNTER — Other Ambulatory Visit: Payer: Self-pay | Admitting: Cardiology

## 2023-04-15 ENCOUNTER — Ambulatory Visit: Payer: 59 | Attending: Nurse Practitioner | Admitting: Nurse Practitioner

## 2023-04-15 NOTE — Progress Notes (Deleted)
 Office Visit    Patient Name: Veronica [name] Date of Encounter: 04/15/2023  Primary Care Provider:  [place], Betty G, MD Primary Cardiologist:  Veronica Lemma, MD  Chief Complaint    73 year old-year-old female with a history of mild nonobstructive CAD, hypertension, hyperlipidemia, asthma, and anxiety who presents for follow-up related to CAD and hypertension.   Past Medical History    Past Medical History:  Diagnosis Date   Allergic rhinitis    Anxiety and depression    Asthma    Constipation, chronic    COPD (chronic obstructive pulmonary disease) (HCC)    Coronary artery calcification seen on CAT scan 06/2010   Mild nonobstructive CAD on cardiac CT  -as of 2021, coronary calcium score 34   Dyspnea    Hemorrhoids    Hip pain, bilateral    Hyperlipidemia    Hypertension    Leukoplakia of vagina    Low back pain    w/ left side radiculopathy, s/p injection at L4-L5 forarmen 1/06   Uterine fibroid    Past Surgical History:  Procedure Laterality Date   ABDOMINAL HYSTERECTOMY  11-2000   no oophorectomy per pt   CHOLECYSTECTOMY  12/08/2010   Procedure: LAPAROSCOPIC CHOLECYSTECTOMY WITH INTRAOPERATIVE CHOLANGIOGRAM;  Surgeon: Veronica Merino, MD;  Location: [facility];  Service: General;  Laterality: N/A;  c-arm    COLONOSCOPY WITH PROPOFOL N/A 02/02/2014   Procedure: COLONOSCOPY WITH PROPOFOL;  Surgeon: Veronica Bumpers, MD;  Location: [facility];  Service: Endoscopy;  Laterality: N/A;   CORONARY CALCIUM SCORE  12/2018   Coronary calcium score-34   CORONARY CT ANGIOGRAM  06/2010    Coronary calcium score 12.  Mild nonobstructive disease.  Ostial LAD and first D1/RI.  Right dominant.  Focal scarring and bronchiectasis of medial right lower lobe.   CYST EXCISION Left 08/08/2021   Procedure: EXCISION LEFT UPPER BACK SEBACEOUS CYST X2;  Surgeon: Veronica Miyamoto, MD;  Location: [facility];  Service: General;  Laterality: Left;   LEEP     unknown date    NM MYOVIEW LTD  10/2010    Nonischemic.  Normal EF.   TUBAL LIGATION     bilateral 1989   UTERINE FIBROID SURGERY      Allergies  Allergies  Allergen Reactions   No [employer] Food Allergies Other (See Comments)    Cheese-elevated blood pressure   Peanut-Containing Drug Products Other (See Comments)    headache     Labs/Other Studies Reviewed    The following studies were reviewed today: *** Cardiac Studies & Procedures   ______________________________________________________________________________________________     ECHOCARDIOGRAM  ECHOCARDIOGRAM COMPLETE 03/08/2021  Narrative ECHOCARDIOGRAM REPORT    Patient Name:   Veronica [name] [facility] Date of Exam: 03/08/2021 Medical Rec #:  [id number]      Height:       65.0 in Accession #:    [id number]     Weight:       207.2 lb Date of Birth:  August 22, 1950      BSA:          2.008 m Patient Age:    71 years       BP:           138/76 mmHg Patient Gender: F              HR:           61 bpm. Exam Location:  [address]  Procedure: 2D Echo, Cardiac Doppler and Color Doppler  Indications:  R06.02 SOB; R07.9* Chest pain, unspecified  History:        Patient has no prior history of Echocardiogram examinations. Risk Factors:Hypertension and Dyslipidemia. Precordial pain. Obesity. Coronary artery calcification seen on CAT scan. Low back pain. Aortic atherosclerosis.  Sonographer:    Veronica [name] RCS Referring Phys: [phone] Veronica [name] C Veronica [name]  IMPRESSIONS   1. Left ventricular ejection fraction, by estimation, is 55 to 60%. The left ventricle has normal function. The left ventricle has no regional wall motion abnormalities. Left ventricular diastolic parameters are consistent with Grade I diastolic dysfunction (impaired relaxation). 2. Right ventricular systolic function is normal. The right ventricular size is normal. There is normal pulmonary artery systolic pressure. The estimated right ventricular systolic pressure is 25.7 mmHg. 3. The mitral valve is  normal in structure. Trivial mitral valve regurgitation. No evidence of mitral stenosis. 4. The aortic valve is tricuspid. Aortic valve regurgitation is trivial. Aortic valve sclerosis/calcification is present, without any evidence of aortic stenosis. 5. The inferior vena cava is normal in size with greater than 50% respiratory variability, suggesting right atrial pressure of 3 mmHg.  FINDINGS Left Ventricle: Left ventricular ejection fraction, by estimation, is 55 to 60%. The left ventricle has normal function. The left ventricle has no regional wall motion abnormalities. The left ventricular internal cavity size was normal in size. There is no left ventricular hypertrophy. Left ventricular diastolic parameters are consistent with Grade I diastolic dysfunction (impaired relaxation).  Right Ventricle: The right ventricular size is normal. No increase in right ventricular wall thickness. Right ventricular systolic function is normal. There is normal pulmonary artery systolic pressure. The tricuspid regurgitant velocity is 2.38 m/s, and with an assumed right atrial pressure of 3 mmHg, the estimated right ventricular systolic pressure is 25.7 mmHg.  Left Atrium: Left atrial size was normal in size.  Right Atrium: Right atrial size was normal in size.  Pericardium: There is no evidence of pericardial effusion.  Mitral Valve: The mitral valve is normal in structure. Trivial mitral valve regurgitation. No evidence of mitral valve stenosis.  Tricuspid Valve: The tricuspid valve is normal in structure. Tricuspid valve regurgitation is trivial.  Aortic Valve: The aortic valve is tricuspid. Aortic valve regurgitation is trivial. Aortic regurgitation PHT measures 1139 msec. Aortic valve sclerosis/calcification is present, without any evidence of aortic stenosis.  Pulmonic Valve: The pulmonic valve was normal in structure. Pulmonic valve regurgitation is not visualized.  Aorta: The aortic root is normal  in size and structure.  Venous: The inferior vena cava is normal in size with greater than 50% respiratory variability, suggesting right atrial pressure of 3 mmHg.  IAS/Shunts: No atrial level shunt detected by color flow Doppler.   LEFT VENTRICLE PLAX 2D LVIDd:         4.10 cm   Diastology LVIDs:         2.65 cm   LV e' medial:    9.03 cm/s LV PW:         1.30 cm   LV E/e' medial:  9.6 LV IVS:        1.10 cm   LV e' lateral:   13.10 cm/s LVOT diam:     2.00 cm   LV E/e' lateral: 6.6 LV SV:         71 LV SV Index:   35 LVOT Area:     3.14 cm   RIGHT VENTRICLE RV Basal diam:  2.50 cm RV S prime:     12.10 cm/s TAPSE (M-mode): 2.2  cm RVSP:           25.7 mmHg  LEFT ATRIUM             Index        RIGHT ATRIUM           Index LA diam:        3.40 cm 1.69 cm/m   RA Pressure: 3.00 mmHg LA Vol (A2C):   36.3 ml 18.08 ml/m  RA Area:     14.20 cm LA Vol (A4C):   34.9 ml 17.38 ml/m  RA Volume:   32.70 ml  16.28 ml/m LA Biplane Vol: 35.6 ml 17.73 ml/m AORTIC VALVE LVOT Vmax:   91.80 cm/s LVOT Vmean:  63.800 cm/s LVOT VTI:    0.226 m AI PHT:      1139 msec  AORTA Ao Root diam: 3.10 cm Ao Asc diam:  3.00 cm  MITRAL VALVE                TRICUSPID VALVE MV Area (PHT): 3.31 cm     TR Peak grad:   22.7 mmHg MV Decel Time: 229 msec     TR Vmax:        238.00 cm/s MV E velocity: 86.50 cm/s   Estimated RAP:  3.00 mmHg MV A velocity: 101.00 cm/s  RVSP:           25.7 mmHg MV E/A ratio:  0.86 SHUNTS Systemic VTI:  0.23 m Systemic Diam: 2.00 cm  Dalton McleanMD Electronically signed by Wilfred Lacy Signature Date/Time: 03/08/2021/6:19:37 PM    Final    MONITORS  LONG TERM MONITOR (3-14 DAYS) 12/29/2019  Narrative  Sinus rhythm: Minimum heart rate 55 bpm, maximum 126 bpm with an average of 73 bpm.  Rare (< 1%) isolated PACs and PVCs with the very rare PAC couplets.  1 PAC quadruplet-officially short burst of PAT-rate 148 bpm with an average rate of 124 bpm.  No  sustained arrhythmias or significant bradycardia.  Patient wear time: 13-day, 8 hours-analysis time 12 days 11/27/2019-12/10/2019  4 patient triggers with sinus rhythm, no diary entries.  Essentially normal study with no arrhythmias and minimal PACs/PVCs.   Veronica Lemma, MD   CT SCANS  CT CORONARY MORPH W/CTA COR W/SCORE 03/10/2021  Addendum 03/13/2021  6:43 AM ADDENDUM REPORT: 03/13/2021 06:41  EXAM: OVER-READ INTERPRETATION  CT CHEST  The following report is an over-read performed by radiologist Dr. Schuyler Amor [facility] on 03/13/2021. This over-read does not include interpretation of cardiac or coronary anatomy or pathology. The coronary CTA interpretation by the cardiologist is attached.  COMPARISON:  01/02/2019  FINDINGS: Vascular: No significant abnormality identified.  Mediastinum/nodes: No mass or adenopathy identified.  Lungs/pleura: No pleural effusion, airspace consolidation, or pneumothorax. Scar identified within the lingula.  Upper abdomen: No acute abnormality.  Musculoskeletal: Within the subcutaneous soft tissues of the lower chest wall, eccentric to left of midline, there is a well-circumscribed low-density nodule measuring 1.4 cm, image 59/11. This is favored to represent a benign epidermal inclusion cyst.  Spondylosis noted within the thoracic spine. No acute or suspicious osseous findings.  IMPRESSION: No significant noncardiac supplemental findings identified.   Electronically Signed By: Signa Kell M.D. On: 03/13/2021 06:41  Narrative CLINICAL DATA:  Chest pain  EXAM: Cardiac CTA  MEDICATIONS: Sub lingual nitro. 4mg  and lopressor 100mg   TECHNIQUE: The patient was scanned on a Siemens Force 192 slice scanner. Gantry rotation speed was 250 msecs. Collimation was .6 mm. A  100 kV prospective scan was triggered in the ascending thoracic aorta at 140 HU's Full mA was used between 35% and 75% of the R-R  interval. Average HR during the scan was 56 bpm. The 3D data set was interpreted on a dedicated work station using MPR, MIP and VRT modes. A total of 80 cc of contrast was used.  FINDINGS: Non-cardiac: See separate report from [facility]. No significant findings on limited lung and soft tissue windows.  Calcium Score: Calcium noted in LAD and LCX  LM 0  RCA 0  LAD 23  LCX 5.34  Total: 28.4  Coronary Arteries: Right dominant with no anomalies  LM: Normal  LAD: 1-24% mixed plaque with some external remodeling ostial and proximal vessel  IM: 1-24% calcified plaque  Circumflex: 1-24% calcified plaque proximally  OM1: Normal  OM2: Normal  RCA:  Normal  PDA: Normal  PLA: Normal  IMPRESSION: 1. Calcium Score 28.4 which is 70 th percentile for age/sex  2.  Normal ascending thoracic aorta 3.3 cm  3.  CAD RADS 1 non obstructive CAD see description above  Charlton Haws  Electronically Signed: By: Charlton Haws M.D. On: 03/10/2021 16:42   CT SCANS  CT CARDIAC SCORING (SELF PAY ONLY) 01/02/2019  Addendum 01/02/2019  4:30 PM ADDENDUM REPORT: 01/02/2019 16:28  CLINICAL DATA:  Risk stratification  EXAM: Coronary Calcium Score  TECHNIQUE: The patient was scanned on a [employer] scanner. Axial non-contrast 3 mm slices were carried out through the heart. The data set was analyzed on a dedicated work station and scored using the Agatson method.  FINDINGS: Non-cardiac: See separate report from [facility].  Ascending Aorta: Normal Caliber.  Mildly calcified.  Pericardium: Normal  Coronary arteries: Normal coronary origins. Coronary calcifications on the LAD and LCx.  IMPRESSION: Coronary calcium score of 34. This was 68th percentile for age and sex matched control.  Armanda Magic   Electronically Signed By: Armanda Magic On: 01/02/2019 16:28  Narrative EXAM: OVER-READ INTERPRETATION  CT CHEST  The following report is an  over-read performed by radiologist Dr. Jeronimo Greaves of [facility] on 01/02/2019. This over-read does not include interpretation of cardiac or coronary anatomy or pathology. The calcium score interpretation by the cardiologist is attached.  COMPARISON:  Chest radiograph 04/01/2018. CTA of the chest 10/08/2010.  FINDINGS: Vascular: Aortic atherosclerosis.  Mediastinum/Nodes: No imaged thoracic adenopathy.  Lungs/Pleura: No imaged pleural fluid. Lingular scarring or subsegmental atelectasis.  Upper Abdomen: Normal imaged portions of the liver.  Musculoskeletal: No acute osseous abnormality. Lower thoracic spondylosis.  IMPRESSION: 1.  No acute findings in the imaged extracardiac chest. 2.  Aortic Atherosclerosis (ICD10-I70.0).  Electronically Signed: By: Jeronimo Greaves M.D. On: 01/02/2019 14:20   CT SCANS  CT CORONARY MORPH W/CTA COR W/SCORE 07/27/2010  Narrative *RADIOLOGY REPORT*  INDICATION:  73 year old female with left upper chest pain and left upper extremity pain on the recent airplane flight from [place]. Negative cardiac enzymes x 2 while in the emergency department.  CT ANGIOGRAPHY OF THE HEART (CORONARY ARTERIES), STRUCTURE, AND MORPHOLOGY  CONTRAST:  80 ml Omnipaque 350 IV.  COMPARISON:  None.  TECHNIQUE:  CT angiography of the coronary vessels was performed on a 256 channel system using prospective ECG gating.  A scout and noncontrast exam (for calcium scoring) were performed.  Circulation time was measured using a test bolus.  Coronary CTA was performed with sub mm slice collimation during portions of the cardiac cycle (73%, 78%, and 83%) during injection  of iodinated contrast. Imaging post processing was performed on an independent workstation creating multiplanar and 3-D images, and quantitative analysis of the heart and coronary arteries.  Note that this exam targets the heart and the chest was not imaged in its  entirety.  PREMEDICATION: Lopressor 0 mg, P.O. Lopressor 0 mg, IV Nitroglycerin 0.4 mcg, sublingual.  FINDINGS: Technical quality:  Fair to moderate.  This is due to the fact that the heart rate increased from 60 to 75 during the imaging portion of the examination and due to less than optimal coronary vasodilation after sublingual nitroglycerin. Heart rate:  75  CORONARY ARTERIES: Left main coronary artery:  Normal.  Left anterior descending:  Large ramus branch/first diagonal branch with a small second diagonal identified distally.  Nonobstructive calcified plaque in the LAD origin.  Nonobstructive calcified plaque in the proximal LAD.  Nonobstructive calcified plaque within the large first diagonal/ramus branch.  Left circumflex:  Normal.  2 small visible obtuse marginal branches.  Right coronary artery:  Normal.  Posterior descending artery:  Normal.  Dominance:  Right.  CORONARY CALCIUM:  Total Agatston Score:  12.62 MESA database percentile:  75th  CARDIAC MEASUREMENTS: Interventricular septum (6 - 12 mm):  9 mm LV posterior wall (6 - 12 mm):  10 mm LV diameter in diastole (35 - 52 mm):  44 mm  AORTA AND PULMONARY MEASUREMENTS: Aortic root (21 - 40 mm): 24 mm  at the annulus 33 mm  at the sinuses of Valsalva 22 mm  at the sinotubular junction Ascending aorta (<  40 mm):  33 mm Descending aorta (<  40 mm):  26 mm Main pulmonary artery:  (<  30 mm):  23 mm  EXTRACARDIAC FINDINGS: Focal area of scar and mild bronchiectasis medially in the left lower lobe.  Minimal scarring in the lingula.  Residual thymic tissue in the anterior mediastinum.  No significant lymphadenopathy within the visualized mediastinum.  Visualized extreme upper abdomen unremarkable.  IMPRESSION:  1.  Mild nonobstructive coronary artery disease.  The patient's total coronary artery calcium score is 12.62, which is 75th percentile for patient's matched age and gender. 2.   Nonobstructive calcified plaques at the origin of the LAD, the proximal LAD, and in the midportion of a large first diagonal/ramus branch. 3.  Right coronary artery dominance. 4.  Focal area of scarring and bronchiectasis medially in the right lower lobe.  No other significant non-cardiac findings.  Report was called to CDU mid level at [phone] at 1150 hours on 07/27/2010.  Delay in the report was due to issues with Information Technology in that I was initially unable to log onto the [employer] independent workstation in order to perform the coronary analysis.  Original Report Authenticated By: Arnell Sieving, M.D.     ______________________________________________________________________________________________     Recent Labs: 07/09/2022: Hemoglobin 12.3; Platelets 216 09/13/2022: ALT 12 03/21/2023: BUN 13; Creatinine, Ser 0.65; Potassium 4.1; Sodium 138  Recent Lipid Panel    Component Value Date/Time   CHOL 149 03/21/2023 1044   CHOL 141 09/13/2022 1036   TRIG 147.0 03/21/2023 1044   HDL 49.20 03/21/2023 1044   HDL 54 09/13/2022 1036   CHOLHDL 3 03/21/2023 1044   VLDL 29.4 03/21/2023 1044   LDLCALC 71 03/21/2023 1044   LDLCALC 67 09/13/2022 1036   LDLCALC 132 (H) 08/28/2019 1048   LDLDIRECT 161.6 06/03/2012 1043    History of Present Illness    73 year old-year-old female with the above past medical history including mild  nonobstructive CAD, hypertension, hyperlipidemia, asthma, and anxiety.   She established care with Dr. Herbie Baltimore in 2020 in the setting of strong family history of CAD. Coronary CTA in 2012 showed calcium score of 12, mild nonobstructive CAD. Follow-up coronary calcium score in 2020 was 34 (68th percentile for age and sex). Outpatient Monitor in October 2021 in the setting of palpitations showed predominantly sinus rhythm, rare PACs/PVCs, short runs of PAT, no significant arrhythmia.  She had multiple ED visits in 2022 in the setting of  intermittent chest pain, shortness of breath.  Each time she ruled out for ACS.  Repeat coronary CTA in February 2023 in the setting of ongoing chest pain and exertional dyspnea revealed calcium score 28.4 (70 percentile for age/sex), nonobstructive CAD.  Echocardiogram showed EF 55 to 60%, G1 DD, aortic valve sclerosis without evidence of stenosis.  She was last seen in the office on 07/06/2022 and was stable from a cardiac standpoint. She reported intermittent palpitations, intermittent dizziness.  She declined cardiac monitor, carotid Dopplers showed no significant carotid artery stenosis.    She presents today for follow-up.  Since her last visit she has   1. Mild nonobstructive CAD/chest pain/shortness of breath: Repeat coronary CTA 03/2021, showed calcium score 28.4 (70 percentile for age/sex), nonobstructive CAD. Echo showed EF 55 to 60%, G1 DD, aortic valve sclerosis without evidence of stenosis. Dyspnea on exertion improved with inhalers and increased activity. Denies symptoms concerning for angina.  Continue losartan, Crestor.  2. Palpitations/dizziness: Monitor in October 2021 in the setting of palpitations showed predominantly sinus rhythm, rare PACs/PVCs, short runs of PAT, no significant arrhythmia.  She does note intermittent fleeting palpitations, she has had recent dizziness in the evenings and upon waking in the mornings.  She stopped taking her nighttime amlodipine and has noticed some mild improvement in her symptoms.  She denies any presyncope, syncope.  EKG in 01/2022 showed sinus bradycardia, 59 bpm.  We discussed possibility of repeat cardiac monitor, however, patient declines.  Will check carotid Dopplers.  Will update CBC, CMET.  Discussed ED precautions.  Continue to monitor symptoms.   3. Hypertension: BP well controlled, she has had some recent dizziness.  Concur amlodipine over the past week and her symptoms have improved slightly.  Will discontinue amlodipine.  Continue losartan.   She notes she recently picked up her losartan from her pharmacy and the bottle read 100 mg daily.  She should be taking losartan 50 mg daily.  We will reach out to her pharmacy to clarify.  4. Hyperlipidemia: LDL was 148 in 12/2021.  Not taking her Crestor at the time.  Will update fasting lipid panel, CMET.  Continue Crestor.   5. Obesity: Encouraged ongoing lifestyle modifications with diet and exercise.  Continues to struggle with weight loss.  I advised her to follow-up with her PCP to discuss possible benefits of GLP-1 receptor agonist for weight loss (Wegovy or Zepbound).   6. Disposition: Follow-up in  Home Medications    Current Outpatient Medications  Medication Sig Dispense Refill   albuterol (VENTOLIN HFA) 108 (90 Base) MCG/ACT inhaler Inhale 2 puffs into the lungs every 6 (six) hours as needed. (Patient not taking: Reported on 03/21/2023) 8 [name] 1   atorvastatin (LIPITOR) 20 MG tablet Take 1 tablet (20 mg total) by mouth daily. 90 tablet 3   citalopram (CELEXA) 10 MG tablet TAKE 1 TABLET BY MOUTH EVERY DAY 30 tablet 2   fluticasone (FLONASE) 50 MCG/ACT nasal spray Place 2 sprays into both nostrils  daily. (Patient not taking: Reported on 03/21/2023) 16 [name] 5   fluticasone furoate-vilanterol (BREO ELLIPTA) 200-25 MCG/ACT AEPB With respiratory illness or flare up, start 1 puff daily for 1-2 weeks. (Patient not taking: Reported on 03/21/2023) 60 each 1   hydrOXYzine (ATARAX) 25 MG tablet Take 1-2 tablets (25-50 mg total) by mouth at bedtime as needed. 60 tablet 2   losartan (COZAAR) 100 MG tablet TAKE 1 TABLET BY MOUTH EVERY DAY 90 tablet 0   meloxicam (MOBIC) 7.5 MG tablet Take 1-2 tablets (7.5-15 mg total) by mouth daily. For shoulder pain 30 tablet 0   permethrin (ELIMITE) 5 % cream Apply from the neck down at bedtime and rinse after 8 hours.  May repeat in 14 days if symptoms persist. (Patient not taking: Reported on 03/21/2023) 60 [name] 1   No current facility-administered medications for this  visit.     Review of Systems    ***.  All other systems reviewed and are otherwise negative except as noted above.    Physical Exam    VS:  There were no vitals taken for this visit. , BMI There is no height or weight on file to calculate BMI.     GEN: Well nourished, well developed, in no acute distress. HEENT: normal. Neck: Supple, no JVD, carotid bruits, or masses. Cardiac: RRR, no murmurs, rubs, or gallops. No clubbing, cyanosis, edema.  Radials/DP/PT 2+ and equal bilaterally.  Respiratory:  Respirations regular and unlabored, clear to auscultation bilaterally. GI: Soft, nontender, nondistended, BS + x 4. MS: no deformity or atrophy. Skin: warm and dry, no rash. Neuro:  Strength and sensation are intact. Psych: Normal affect.  Accessory Clinical Findings    ECG personally reviewed by me today -    - no acute changes.   Lab Results  Component Value Date   WBC 3.9 07/09/2022   HGB 12.3 07/09/2022   HCT 37.2 07/09/2022   MCV 89 07/09/2022   PLT 216 07/09/2022   Lab Results  Component Value Date   CREATININE 0.65 03/21/2023   BUN 13 03/21/2023   NA 138 03/21/2023   K 4.1 03/21/2023   CL 104 03/21/2023   CO2 27 03/21/2023   Lab Results  Component Value Date   ALT 12 09/13/2022   AST 20 09/13/2022   ALKPHOS 70 09/13/2022   BILITOT 0.8 09/13/2022   Lab Results  Component Value Date   CHOL 149 03/21/2023   HDL 49.20 03/21/2023   LDLCALC 71 03/21/2023   LDLDIRECT 161.6 06/03/2012   TRIG 147.0 03/21/2023   CHOLHDL 3 03/21/2023    Lab Results  Component Value Date   HGBA1C 5.6 10/13/2020    Assessment & Plan    1.  ***  No BP recorded.  {Refresh Note OR Click here to enter BP  :1}***   Joylene Grapes, NP 04/15/2023, 6:40 AM

## 2023-05-25 ENCOUNTER — Other Ambulatory Visit: Payer: Self-pay | Admitting: Family Medicine

## 2023-05-25 DIAGNOSIS — F411 Generalized anxiety disorder: Secondary | ICD-10-CM

## 2023-06-20 ENCOUNTER — Encounter: Admitting: Family Medicine

## 2023-06-20 ENCOUNTER — Encounter: Payer: Self-pay | Admitting: Family Medicine

## 2023-06-20 NOTE — Progress Notes (Signed)
 Patient unable to obtain vital signs due to telehealth visit

## 2023-06-20 NOTE — Progress Notes (Signed)
 Error - patient refused appt. She said she prefers to see Dr. [place] for her wellness visit and said she had already canceled.

## 2023-06-20 NOTE — Patient Instructions (Signed)
 erro

## 2023-06-26 ENCOUNTER — Other Ambulatory Visit: Payer: Self-pay | Admitting: Internal Medicine

## 2023-07-17 ENCOUNTER — Encounter: Admitting: Family Medicine

## 2023-07-17 ENCOUNTER — Ambulatory Visit

## 2023-07-26 ENCOUNTER — Ambulatory Visit

## 2023-08-27 ENCOUNTER — Ambulatory Visit: Payer: Self-pay | Admitting: Family Medicine

## 2023-08-27 ENCOUNTER — Encounter: Payer: Self-pay | Admitting: Family Medicine

## 2023-08-27 ENCOUNTER — Ambulatory Visit (INDEPENDENT_AMBULATORY_CARE_PROVIDER_SITE_OTHER): Admitting: Family Medicine

## 2023-08-27 VITALS — BP 135/80 | HR 76 | Temp 98.6°F | Resp 16 | Ht 65.0 in | Wt 225.0 lb

## 2023-08-27 DIAGNOSIS — E042 Nontoxic multinodular goiter: Secondary | ICD-10-CM | POA: Diagnosis not present

## 2023-08-27 DIAGNOSIS — Z1211 Encounter for screening for malignant neoplasm of colon: Secondary | ICD-10-CM | POA: Diagnosis not present

## 2023-08-27 DIAGNOSIS — I10 Essential (primary) hypertension: Secondary | ICD-10-CM | POA: Diagnosis not present

## 2023-08-27 DIAGNOSIS — F411 Generalized anxiety disorder: Secondary | ICD-10-CM

## 2023-08-27 DIAGNOSIS — Z0000 Encounter for general adult medical examination without abnormal findings: Secondary | ICD-10-CM | POA: Insufficient documentation

## 2023-08-27 DIAGNOSIS — E785 Hyperlipidemia, unspecified: Secondary | ICD-10-CM | POA: Diagnosis not present

## 2023-08-27 DIAGNOSIS — E559 Vitamin D deficiency, unspecified: Secondary | ICD-10-CM | POA: Diagnosis not present

## 2023-08-27 LAB — VITAMIN D 25 HYDROXY (VIT D DEFICIENCY, FRACTURES): VITD: 29.49 ng/mL — ABNORMAL LOW (ref 30.00–100.00)

## 2023-08-27 LAB — COMPREHENSIVE METABOLIC PANEL WITH GFR
ALT: 22 U/L (ref 0–35)
AST: 20 U/L (ref 0–37)
Albumin: 4.5 g/dL (ref 3.5–5.2)
Alkaline Phosphatase: 76 U/L (ref 39–117)
BUN: 16 mg/dL (ref 6–23)
CO2: 23 meq/L (ref 19–32)
Calcium: 9.6 mg/dL (ref 8.4–10.5)
Chloride: 102 meq/L (ref 96–112)
Creatinine, Ser: 0.71 mg/dL (ref 0.40–1.20)
GFR: 85.49 mL/min (ref >60.00)
Glucose, Bld: 91 mg/dL (ref 70–99)
Potassium: 4.3 meq/L (ref 3.5–5.1)
Sodium: 136 meq/L (ref 135–145)
Total Bilirubin: 0.7 mg/dL (ref 0.2–1.2)
Total Protein: 7.3 g/dL (ref 6.0–8.3)

## 2023-08-27 LAB — TSH: TSH: 0.94 u[IU]/mL (ref 0.35–5.50)

## 2023-08-27 LAB — LIPID PANEL
Cholesterol: 195 mg/dL (ref 0–200)
HDL: 52.3 mg/dL (ref >39.00)
LDL Cholesterol: 114 mg/dL — ABNORMAL HIGH (ref 0–99)
NonHDL: 142.37
Total CHOL/HDL Ratio: 4
Triglycerides: 141 mg/dL (ref 0.0–149.0)
VLDL: 28.2 mg/dL (ref 0.0–40.0)

## 2023-08-27 MED ORDER — LOSARTAN POTASSIUM 100 MG PO TABS
100.0000 mg | ORAL_TABLET | Freq: Every day | ORAL | 3 refills | Status: AC
Start: 2023-08-27 — End: ?

## 2023-08-27 MED ORDER — ATORVASTATIN CALCIUM 20 MG PO TABS
20.0000 mg | ORAL_TABLET | Freq: Every day | ORAL | 2 refills | Status: AC
Start: 2023-08-27 — End: ?

## 2023-08-27 NOTE — Assessment & Plan Note (Signed)
 Comorbidities: Hypertension, OA, GERD, and anxiety. Has gained a few lb since her last visit. She understands the benefits of wt loss as well as adverse effects of obesity. Consistency with healthy diet and physical activity encouraged.

## 2023-08-27 NOTE — Assessment & Plan Note (Signed)
 Continue current dose of vitamin D supplementation. Further recommendation will be given according to 25 OH vitamin D result.

## 2023-08-27 NOTE — Assessment & Plan Note (Signed)
 Problem is well-controlled with current regimen. Currently on citalopram  10 mg daily and hydroxyzine  25 mg at bedtime. As far as problem is stable, she can continue following annually, before if needed.

## 2023-08-27 NOTE — Assessment & Plan Note (Signed)
 Currently on atorvastatin  20 mg daily. Low-fat diet also recommended. Further recommendation will be given according to lipid panel result.

## 2023-08-27 NOTE — Progress Notes (Signed)
 HPI: [name] is a 73 y.o. female  with a PMHx significant for HTN, HLD, anxiety, asthma, GERD, thyroid  nodules, vaginal leukoplakia, insomnia, aortic atherosclerosis, and polyarthralgia, among some, who is here today for her routine physical.  Last CPE: 08/28/2019  Exercise: Not currently exercising, stating she has recently been off her regular routine.  Diet: Home cooked meals; not eating vegetables daily.  Sleep: 6-7 hours/night  Smoking: Never. Alcohol consumption: None.   Dental: UTD with routine dental care.  Vision: Not established with eye doctor, but plans to establish soon.   Health Maintenance  Topic Date Due   Medicare Annual Wellness Visit  09/19/2022   Mammogram  04/17/2023   COVID-19 Vaccine (5 - 2024-25 season) 09/12/2023*   Zoster (Shingles) Vaccine (1 of 2) 02/06/2024*   Flu Shot  08/30/2023   Colon Cancer Screening  02/03/2024   DTaP/Tdap/Td vaccine (2 - Td or Tdap) 10/23/2024   Pneumococcal Vaccine for age over 43  Completed   DEXA scan (bone density measurement)  Completed   Hepatitis C Screening  Completed   Hepatitis B Vaccine  Aged Out   HPV Vaccine  Aged Out   Meningitis B Vaccine  Aged Out  *Topic was postponed. The date shown is not the original due date.   Immunization History  Administered Date(s) Administered   Hepatitis A, Adult 10/26/2015   Influenza Whole 10/07/2009   MMR 10/26/2015   PFIZER Comirnaty(Gray Top)Covid-19 Tri-Sucrose Vaccine 02/10/2020   PFIZER(Purple Top)SARS-COV-2 Vaccination 04/10/2019, 05/05/2019, 08/30/2019   Pneumococcal Conjugate-13 01/06/2018   Pneumococcal Polysaccharide-23 08/28/2019   Tdap 10/24/2014   Zoster, Live 09/01/2012   Chronic medical problems:  Hypertension Currently on Losartan  100 mg daily. Good compliance and tolerance.  Pt states she took her last dose from her current supply last night and requests a refill today.  No adverse side effects reported.  BP readings at home average in  the 130s/70s Negative for unusual or severe headache, visual changes, exertional chest pain, dyspnea,  focal weakness, or edema.  Lab Results  Component Value Date   NA 138 03/21/2023   CL 104 03/21/2023   K 4.1 03/21/2023   CO2 27 03/21/2023   BUN 13 03/21/2023   CREATININE 0.65 03/21/2023   GFR 88.80 03/21/2023   CALCIUM  9.2 03/21/2023   ALBUMIN 4.6 09/13/2022   GLUCOSE 91 03/21/2023   Hyperlipidemia: Currently on Atorvastatin  20 mg once daily. Pt is taking statin at nighttime.  Good compliance and tolerance.  No adverse side effects reported.  Lab Results  Component Value Date   CHOL 149 03/21/2023   HDL 49.20 03/21/2023   LDLCALC 71 03/21/2023   LDLDIRECT 161.6 06/03/2012   TRIG 147.0 03/21/2023   CHOLHDL 3 03/21/2023   Anxiety  Moods are overall stable today.  Currently on Celexa  10 mg once daily and Hydroxyzine  25-50 mg PRN. Pt is compliant and tolerates her medications well.  Negative for SI/HI.   Pt is also taking Meloxicam  7.5-15 mg (1-2 tabs) for chronic shoulder pain.   She is taking OTC vitamin D3 supplements. Not currently on Ca++ supplementation.   Lab Results  Component Value Date   VD25OH 28.5 (L) 09/22/2019   She has no acute concerns today.  Review of Systems  Constitutional:  Negative for activity change, appetite change, chills and fever.  HENT:  Negative for mouth sores, sore throat and trouble swallowing.   Eyes:  Negative for redness and visual disturbance.  Respiratory:  Negative for cough, shortness of  breath and wheezing.   Cardiovascular:  Negative for chest pain and leg swelling.  Gastrointestinal:  Negative for abdominal pain, nausea and vomiting.  Endocrine: Negative for cold intolerance, heat intolerance, polydipsia, polyphagia and polyuria.  Genitourinary:  Negative for decreased urine volume, dysuria, hematuria, vaginal bleeding and vaginal discharge.  Musculoskeletal:  Positive for arthralgias and back pain. Negative for gait  problem.  Skin:  Negative for color change and rash.  Allergic/Immunologic: Positive for environmental allergies.  Neurological:  Negative for syncope, weakness and headaches.  Hematological:  Negative for adenopathy. Does not bruise/bleed easily.  Psychiatric/Behavioral:  Negative for confusion and hallucinations.   All other systems reviewed and are negative.  Current Outpatient Medications on File Prior to Visit  Medication Sig Dispense Refill   atorvastatin  (LIPITOR) 20 MG tablet Take 1 tablet (20 mg total) by mouth daily. 90 tablet 3   citalopram  (CELEXA ) 10 MG tablet TAKE 1 TABLET BY MOUTH EVERY DAY 30 tablet 2   fluticasone  (FLONASE ) 50 MCG/ACT nasal spray SPRAY 2 SPRAYS INTO EACH NOSTRIL EVERY DAY 48 mL 0   hydrOXYzine  (ATARAX ) 25 MG tablet TAKE 1-2 TABLETS (25-50 MG TOTAL) BY MOUTH AT BEDTIME AS NEEDED. 60 tablet 2   meloxicam  (MOBIC ) 7.5 MG tablet Take 1-2 tablets (7.5-15 mg total) by mouth daily. For shoulder pain 30 tablet 0   No current facility-administered medications on file prior to visit.   Past Medical History:  Diagnosis Date   Allergic rhinitis    Anxiety and depression    Asthma    Constipation, chronic    COPD (chronic obstructive pulmonary disease) (HCC)    Coronary artery calcification seen on CAT scan 06/2010   Mild nonobstructive CAD on cardiac CT  -as of 2021, coronary calcium  score 34   Dyspnea    Hemorrhoids    Hip pain, bilateral    Hyperlipidemia    Hypertension    Leukoplakia of vagina    Low back pain    w/ left side radiculopathy, s/p injection at L4-L5 forarmen 1/06   Uterine fibroid     Past Surgical History:  Procedure Laterality Date   ABDOMINAL HYSTERECTOMY  11-2000   no oophorectomy per pt   CHOLECYSTECTOMY  12/08/2010   Procedure: LAPAROSCOPIC CHOLECYSTECTOMY WITH INTRAOPERATIVE CHOLANGIOGRAM;  Surgeon: Donnice KATHEE Lunger, MD;  Location: [facility];  Service: General;  Laterality: N/A;  c-arm    COLONOSCOPY WITH PROPOFOL  N/A 02/02/2014    Procedure: COLONOSCOPY WITH PROPOFOL ;  Surgeon: Lunger MARLA Louder, MD;  Location: [facility];  Service: Endoscopy;  Laterality: N/A;   CORONARY CALCIUM  SCORE  12/2018   Coronary calcium  score-34   CORONARY CT ANGIOGRAM  06/2010    Coronary calcium  score 12.  Mild nonobstructive disease.  Ostial LAD and first D1/RI.  Right dominant.  Focal scarring and bronchiectasis of medial right lower lobe.   CYST EXCISION Left 08/08/2021   Procedure: EXCISION LEFT UPPER BACK SEBACEOUS CYST X2;  Surgeon: Vernetta Berg, MD;  Location: [facility];  Service: General;  Laterality: Left;   LEEP     unknown date    NM MYOVIEW  LTD  10/2010   Nonischemic.  Normal EF.   TUBAL LIGATION     bilateral 1989   UTERINE FIBROID SURGERY      Allergies  Allergen Reactions   No [employer] Food Allergies Other (See Comments)    Cheese-elevated blood pressure   Peanut-Containing Drug Products Other (See Comments)    headache    Family History  Problem Relation  Age of Onset   Hypertension Mother    Hyperlipidemia Mother    Heart attack Mother 57   Heart disease Mother    Anxiety disorder Mother    Hypertension Father    Hyperlipidemia Father    AAA (abdominal aortic aneurysm) Father 28   Heart disease Father    Allergic rhinitis Sister    Allergic rhinitis Brother    Schizophrenia Brother    CVA Brother    Valvular heart disease Brother        Valve Surgery   Allergic rhinitis Brother    Heart disease Brother        Vavle surgery   Esophageal cancer Maternal Grandmother    Schizophrenia Son    Schizophrenia Other        2 nieces   Diabetes Neg Hx    Colon cancer Neg Hx    Breast cancer Neg Hx     Social History   Socioeconomic History   Marital status: Divorced    Spouse name: Not on file   Number of children: 3   Years of education: Not on file   Highest education level: Not on file  Occupational History   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   Social Drivers of Health   Financial Resource Strain: Medium Risk (06/20/2023)   Overall Financial Resource Strain (CARDIA)    Difficulty of Paying Living Expenses: Somewhat hard  Food Insecurity: No Food Insecurity (06/20/2023)   Hunger Vital Sign    Worried About Running Out of Food in the Last Year: Never true    Ran Out of Food in the Last Year: Never true  Transportation Needs: Unmet Transportation Needs (06/20/2023)   PRAPARE - Transportation    Lack of Transportation (Medical): Yes    Lack of Transportation (Non-Medical): Yes  Physical Activity: Sufficiently Active (06/20/2023)   Exercise Vital Sign    Days of Exercise per Week: 5 days    Minutes of Exercise per Session: 60 min  Stress: Stress Concern Present (06/20/2023)   [employer] of Occupational Health - Occupational Stress Questionnaire    Feeling of Stress : To some extent  Social Connections: Moderately Isolated (06/20/2023)   Social Connection and Isolation Panel    Frequency of Communication with Friends and Family: More than three times a week    Frequency of Social Gatherings with Friends and Family: Three times a week    Attends Religious Services: More than 4 times per year    Active Member of Clubs or Organizations: No    Attends Banker Meetings: Never    Marital Status: Divorced   Vitals:   08/27/23 0851 08/27/23 0916  BP: (!) 140/80 135/80  Pulse: 76   Resp: 16    Temp: 98.6 F (37 C)   SpO2: 99%  Body mass index is 37.44 kg/m.  Wt Readings from Last 3 Encounters:  08/27/23 225 lb (102.1 kg)  03/21/23 215 lb 6.4 oz (97.7 kg)  02/12/23 208 lb (94.3 kg)   Physical Exam Vitals and nursing note reviewed.  Constitutional:      General: She is not in acute distress.    Appearance: She is well-developed.  HENT:     Head: Normocephalic and atraumatic.     Right Ear: Tympanic membrane, ear canal and external ear normal.     Left Ear: Tympanic membrane, ear canal and external ear normal.     Mouth/Throat:     Mouth: Mucous membranes are moist.     Pharynx: Oropharynx is clear. Uvula midline.  Eyes:     Extraocular Movements: Extraocular movements intact.     Conjunctiva/sclera: Conjunctivae normal.     Pupils: Pupils are equal, round, and reactive to light.  Neck:     Thyroid : Thyromegaly present. No thyroid  mass.     Trachea: No tracheal deviation.  Cardiovascular:     Rate and Rhythm: Normal rate and regular rhythm.     Pulses:          Dorsalis pedis pulses are 2+ on the right side and 2+ on the left side.     Heart sounds: No murmur heard. Pulmonary:     Effort: Pulmonary effort is normal. No respiratory distress.     Breath sounds: Normal breath sounds.  Abdominal:     Palpations: Abdomen is soft. There is no hepatomegaly or mass.     Tenderness: There is no abdominal tenderness.  Genitourinary:    Comments: No concerns. Musculoskeletal:     Right lower leg: No edema.     Left lower leg: No edema.     Comments: No signs of synovitis appreciated.  Lymphadenopathy:     Cervical: No cervical adenopathy.     Upper Body:     Right upper body: No supraclavicular adenopathy.     Left upper body: No supraclavicular adenopathy.  Skin:    General: Skin is warm.     Findings: No erythema or rash.  Neurological:     General: No focal deficit present.     Mental Status: She is alert and oriented to person, place, and time.      Cranial Nerves: No cranial nerve deficit.     Coordination: Coordination normal.     Gait: Gait normal.     Deep Tendon Reflexes:     Reflex Scores:      Bicep reflexes are 2+ on the right side and 2+ on the left side.      Patellar reflexes are 2+ on the right side and 2+ on the left side. Psychiatric:        Mood and Affect: Mood and affect normal.    ASSESSMENT AND PLAN: Ms. ANIZA SHOR was here today annual physical examination.  Orders Placed This Encounter  Procedures   Comprehensive metabolic panel with GFR   Lipid panel   TSH   VITAMIN D  25 Hydroxy (Vit-D Deficiency, Fractures)   Ambulatory referral to Gastroenterology   Lab Results  Component Value Date   TSH 0.94 08/27/2023   Lab Results  Component Value Date   NA 136 08/27/2023   CL 102 08/27/2023   K 4.3 08/27/2023   CO2 23 08/27/2023   BUN 16 08/27/2023   CREATININE 0.71 08/27/2023   GFR 85.49 08/27/2023   CALCIUM  9.6 08/27/2023  ALBUMIN 4.5 08/27/2023   GLUCOSE 91 08/27/2023   Lab Results  Component Value Date   ALT 22 08/27/2023   AST 20 08/27/2023   ALKPHOS 76 08/27/2023   BILITOT 0.7 08/27/2023   Lab Results  Component Value Date   CHOL 195 08/27/2023   HDL 52.30 08/27/2023   LDLCALC 114 (H) 08/27/2023   LDLDIRECT 161.6 06/03/2012   TRIG 141.0 08/27/2023   CHOLHDL 4 08/27/2023   Lab Results  Component Value Date   VD25OH 29.49 (L) 08/27/2023   Routine general medical examination at a health care facility Assessment & Plan: We discussed the importance of regular physical activity and healthy diet for prevention of chronic illness and/or complications. Preventive guidelines reviewed. Vaccination up to date. She is due for mammogram, recommend calling to arrange appointment. Ca++ and vit D supplementation recommended. Next CPE in a year.   Essential hypertension Assessment & Plan: Overall BP adequately controlled. Continue losartan  100 mg daily and low-salt diet. Continue  monitoring BP regularly. As far as problem is stable, we can continue following annually.  Orders: -     Comprehensive metabolic panel with GFR; Future -     Losartan  Potassium; Take 1 tablet (100 mg total) by mouth daily.  Dispense: 90 tablet; Refill: 3  Multiple thyroid  nodules Assessment & Plan: Last thyroid  US  in 2019. No further follow-up with thyroid  US  is recommended. Will continue with annual TSH.  Orders: -     TSH; Future  Obesity, morbid (HCC) Assessment & Plan: Comorbidities: Hypertension, OA, GERD, and anxiety. Has gained a few lb since her last visit. She understands the benefits of wt loss as well as adverse effects of obesity. Consistency with healthy diet and physical activity encouraged.   Hyperlipidemia with target LDL less than 100 Assessment & Plan: Currently on atorvastatin  20 mg daily. Low-fat diet also recommended. Further recommendation will be given according to lipid panel result.  Orders: -     Comprehensive metabolic panel with GFR; Future -     Lipid panel; Future  Vitamin D  deficiency, unspecified Assessment & Plan: Continue current dose of vitamin D  supplementation. Further recommendation will be given according to 25 OH vitamin D  result.  Orders: -     VITAMIN D  25 Hydroxy (Vit-D Deficiency, Fractures); Future  Generalized anxiety disorder Assessment & Plan: Problem is well-controlled with current regimen. Currently on citalopram  10 mg daily and hydroxyzine  25 mg at bedtime. As far as problem is stable, she can continue following annually, before if needed.   Colon cancer screening -     Ambulatory referral to Gastroenterology  Return in 1 year (on 08/26/2024) for CPE, Labs, chronic problems.  I, Vernell Forest, acting as a scribe for Cambri Plourde [place], MD., have documented all relevant documentation on the behalf of Yahira Timberman [place], MD, as directed by   while in the presence of Janijah Symons [place], MD.  I, Kile Kabler [place], MD, have reviewed all  documentation for this visit. The documentation on 08/27/23 for the exam, diagnosis, procedures, and orders are all accurate and complete.  Merlie Noga G. [place], MD  [facility]

## 2023-08-27 NOTE — Patient Instructions (Addendum)
 A few things to remember from today's visit:  Routine general medical examination at a health care facility  Essential hypertension - Plan: Comprehensive metabolic panel with GFR  Multiple thyroid  nodules - Plan: TSH  Hyperlipidemia with target LDL less than 100 - Plan: Comprehensive metabolic panel with GFR, Lipid panel  Colon cancer screening - Plan: Ambulatory referral to Gastroenterology  No cambios hoy. Si todo es estable segimos con visitas anuales.

## 2023-08-27 NOTE — Assessment & Plan Note (Signed)
 Overall BP adequately controlled. Continue losartan  100 mg daily and low-salt diet. Continue monitoring BP regularly. As far as problem is stable, we can continue following annually.

## 2023-08-27 NOTE — Assessment & Plan Note (Signed)
 We discussed the importance of regular physical activity and healthy diet for prevention of chronic illness and/or complications. Preventive guidelines reviewed. Vaccination up to date. She is due for mammogram, recommend calling to arrange appointment. Ca++ and vit D supplementation recommended. Next CPE in a year.

## 2023-08-27 NOTE — Assessment & Plan Note (Signed)
 Last thyroid  US  in 2019. No further follow-up with thyroid  US  is recommended. Will continue with annual TSH.

## 2023-09-16 ENCOUNTER — Telehealth: Payer: Self-pay

## 2023-09-16 NOTE — Telephone Encounter (Signed)
 Copied from CRM #[id number]. Topic: General - Other >> Sep 13, 2023  5:30 PM Deleta RAMAN wrote: Reason for CRM: patient is calling because she was supposed to receive a call regarding an appointment. She has not received a call was told this on 7/29. Please contact patient regarding concerns

## 2023-09-17 ENCOUNTER — Telehealth: Payer: Self-pay

## 2023-09-17 NOTE — Telephone Encounter (Signed)
 Copied from CRM [id number]. Topic: General - Other >> Sep 17, 2023  3:42 PM Thersia BROCKS wrote: Reason for CRM: Patient called in stated she call to schedule a colonoscopy , but they stated they was unable to scheduled patient , stated she needs to have it in January. Patient wanted to know what she needs to do regarding this

## 2023-09-18 NOTE — Telephone Encounter (Signed)
 Spoke with patient, will wait until January to have routine colonoscopy done.

## 2023-09-21 ENCOUNTER — Other Ambulatory Visit: Payer: Self-pay | Admitting: Family Medicine

## 2023-09-21 DIAGNOSIS — F411 Generalized anxiety disorder: Secondary | ICD-10-CM

## 2023-09-23 DIAGNOSIS — H43393 Other vitreous opacities, bilateral: Secondary | ICD-10-CM | POA: Diagnosis not present

## 2023-09-23 DIAGNOSIS — H25013 Cortical age-related cataract, bilateral: Secondary | ICD-10-CM | POA: Diagnosis not present

## 2023-10-08 DIAGNOSIS — M2569 Stiffness of other specified joint, not elsewhere classified: Secondary | ICD-10-CM | POA: Diagnosis not present

## 2023-10-08 DIAGNOSIS — R293 Abnormal posture: Secondary | ICD-10-CM | POA: Diagnosis not present

## 2023-10-08 DIAGNOSIS — M5416 Radiculopathy, lumbar region: Secondary | ICD-10-CM | POA: Diagnosis not present

## 2023-10-10 DIAGNOSIS — M5416 Radiculopathy, lumbar region: Secondary | ICD-10-CM | POA: Diagnosis not present

## 2023-10-10 DIAGNOSIS — M2569 Stiffness of other specified joint, not elsewhere classified: Secondary | ICD-10-CM | POA: Diagnosis not present

## 2023-10-10 DIAGNOSIS — M6281 Muscle weakness (generalized): Secondary | ICD-10-CM | POA: Diagnosis not present

## 2023-10-10 DIAGNOSIS — R293 Abnormal posture: Secondary | ICD-10-CM | POA: Diagnosis not present

## 2023-10-16 ENCOUNTER — Encounter: Payer: Self-pay | Admitting: Family Medicine

## 2023-10-16 ENCOUNTER — Ambulatory Visit (INDEPENDENT_AMBULATORY_CARE_PROVIDER_SITE_OTHER): Admitting: Family Medicine

## 2023-10-16 VITALS — BP 160/80 | HR 61 | Resp 16 | Wt 229.0 lb

## 2023-10-16 DIAGNOSIS — I10 Essential (primary) hypertension: Secondary | ICD-10-CM | POA: Diagnosis not present

## 2023-10-16 DIAGNOSIS — M6281 Muscle weakness (generalized): Secondary | ICD-10-CM | POA: Diagnosis not present

## 2023-10-16 DIAGNOSIS — E782 Mixed hyperlipidemia: Secondary | ICD-10-CM

## 2023-10-16 DIAGNOSIS — K644 Residual hemorrhoidal skin tags: Secondary | ICD-10-CM

## 2023-10-16 DIAGNOSIS — M2569 Stiffness of other specified joint, not elsewhere classified: Secondary | ICD-10-CM | POA: Diagnosis not present

## 2023-10-16 DIAGNOSIS — M5416 Radiculopathy, lumbar region: Secondary | ICD-10-CM | POA: Diagnosis not present

## 2023-10-16 DIAGNOSIS — N8110 Cystocele, unspecified: Secondary | ICD-10-CM

## 2023-10-16 DIAGNOSIS — E785 Hyperlipidemia, unspecified: Secondary | ICD-10-CM

## 2023-10-16 DIAGNOSIS — R293 Abnormal posture: Secondary | ICD-10-CM | POA: Diagnosis not present

## 2023-10-16 NOTE — Progress Notes (Unsigned)
 ACUTE VISIT Chief Complaint  Patient presents with   Discuss lab results   Hemorrhoids   HPI: Veronica [name] is a 73 y.o. female, who is here today complaining of *** HPI Discussed the use of AI scribe software for clinical note transcription with the patient, who gave verbal consent to proceed.  History of Present Illness Veronica [name] is a 72 year old female who presents for review of laboratory results and concerns about a vaginal mass.  She is here to review her laboratory results from August 27, 2023. Her cholesterol levels have increased slightly, and she continues to take atorvastatin  for cholesterol management. Her vitamin D  levels were low, and she was advised to start taking 2000 units of vitamin D  daily. Other lab results, including liver, kidney, thyroid , electrolytes, and glucose levels, were normal.  She has a history of hypertension and is currently taking losartan  100 mg. Her blood pressure has been slightly elevated recently, which she attributes to a stressful family situation.  She has a history of sciatica and is currently undergoing physical therapy. She experiences numbness and significant pain in both legs, particularly the left hip, and has difficulty walking. This pain is localized to the lower back and hip area.  She discovered a mass in her vaginal area while bathing this morning. It is described as a round mass, approximately the size of a 'jazz ball', located inside the vagina. There is no pain upon touching it, but she is concerned as she has never felt it before.  She has a history of hemorrhoids and reports feeling 'pelotitas' in the rectal area, which sometimes obstruct bowel movements. She uses cream for hemorrhoid management and experiences pain in the rectal area, especially during bowel movements. Her stools are described as small, dark brown pieces. No red blood in her stool, but notes that the stool is sometimes very dark brown. She had a colonoscopy  in 2016, which found one polyp, and she was advised to have a follow-up in five years.  No stomach pain, nausea, vomiting, weight loss, abnormal urination, or vaginal discharge or bleeding.   Review of Systems See other pertinent positives and negatives in HPI.  Current Outpatient Medications on File Prior to Visit  Medication Sig Dispense Refill   atorvastatin  (LIPITOR) 20 MG tablet Take 1 tablet (20 mg total) by mouth daily. 90 tablet 2   citalopram  (CELEXA ) 10 MG tablet TAKE 1 TABLET BY MOUTH EVERY DAY 90 tablet 2   fluticasone  (FLONASE ) 50 MCG/ACT nasal spray SPRAY 2 SPRAYS INTO EACH NOSTRIL EVERY DAY 48 mL 0   hydrOXYzine  (ATARAX ) 25 MG tablet TAKE 1-2 TABLETS (25-50 MG TOTAL) BY MOUTH AT BEDTIME AS NEEDED. 180 tablet 2   losartan  (COZAAR ) 100 MG tablet Take 1 tablet (100 mg total) by mouth daily. 90 tablet 3   meloxicam  (MOBIC ) 7.5 MG tablet Take 1-2 tablets (7.5-15 mg total) by mouth daily. For shoulder pain 30 tablet 0   No current facility-administered medications on file prior to visit.    Past Medical History:  Diagnosis Date   Allergic rhinitis    Anxiety and depression    Asthma    Constipation, chronic    COPD (chronic obstructive pulmonary disease) (HCC)    Coronary artery calcification seen on CAT scan 06/2010   Mild nonobstructive CAD on cardiac CT  -as of 2021, coronary calcium  score 34   Dyspnea    Hemorrhoids    Hip pain, bilateral    Hyperlipidemia  Hypertension    Leukoplakia of vagina    Low back pain    w/ left side radiculopathy, s/p injection at L4-L5 [facility] 1/06   Uterine fibroid    Allergies  Allergen Reactions   No [facility] Food Allergies Other (See Comments)    Cheese-elevated blood pressure   Peanut-Containing Drug Products Other (See Comments)    headache    Social History   Socioeconomic History   Marital status: Divorced    Spouse name: Not on file   Number of children: 3   Years of education: Not on file   Highest  education level: Not on file  Occupational History   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   Social Drivers of Health   Financial Resource Strain: Medium Risk (06/20/2023)   Overall Financial Resource Strain (CARDIA)    Difficulty of Paying Living Expenses: Somewhat hard  Food Insecurity: No Food Insecurity (06/20/2023)   Hunger Vital Sign    Worried About Running Out of Food in the Last Year: Never true    Ran Out of Food in the Last Year: Never true  Transportation Needs: Unmet Transportation Needs (06/20/2023)   PRAPARE - Transportation    Lack of Transportation (Medical): Yes    Lack of Transportation (Non-Medical): Yes  Physical Activity: Sufficiently Active (06/20/2023)   Exercise Vital Sign    Days of Exercise per Week: 5 days    Minutes of Exercise per Session: 60 min  Stress: Stress Concern Present (06/20/2023)   [employer] of Occupational Health - Occupational Stress Questionnaire    Feeling of Stress : To some extent  Social Connections: Moderately Isolated (06/20/2023)   Social Connection and Isolation Panel    Frequency of Communication with Friends and Family: More than three times a week    Frequency of Social Gatherings with Friends and Family: Three times a week    Attends Religious Services: More than 4 times per year    Active Member of Clubs or Organizations: No    Attends Banker  Meetings: Never    Marital Status: Divorced    Vitals:   10/16/23 1439 10/16/23 1505  BP: (!) 154/71 (!) 160/80  Pulse: 61   Resp: 16   SpO2: 98%    Body mass index is 38.11 kg/m.  Physical Exam Vitals and nursing note reviewed. Exam conducted with a chaperone present.  Constitutional:      General: She is not in acute distress.    Appearance: She is well-developed.  HENT:     Head: Normocephalic and atraumatic.     Mouth/Throat:     Mouth: Mucous membranes are moist.     Pharynx: Oropharynx is clear.  Eyes:     Conjunctiva/sclera: Conjunctivae normal.  Cardiovascular:     Rate and Rhythm: Normal rate and regular rhythm.  Pulses:          Dorsalis pedis pulses are 2+ on the right side and 2+ on the left side.     Heart sounds: No murmur heard. Pulmonary:     Effort: Pulmonary effort is normal. No respiratory distress.     Breath sounds: Normal breath sounds.  Abdominal:     Palpations: Abdomen is soft. There is no mass.     Tenderness: There is no abdominal tenderness.  Genitourinary:    Exam position: Lithotomy position.     Urethra: Prolapse present.     Rectum: Guaiac result negative.  Skin:    General: Skin is warm.     Findings: No erythema or rash.  Neurological:     General: No focal deficit present.     Mental Status: She is alert and oriented to person, place, and time.     Cranial Nerves: No cranial nerve deficit.     Gait: Gait normal.  Psychiatric:        Mood and Affect: Mood and affect normal.     Comments: Well groomed, good eye contact.     ASSESSMENT AND PLAN: External hemorrhoid  Essential hypertension Assessment & Plan: BP elevated today, she is not monitoring BP at home, recommend doing so before we decided to adjust antihypertensive therapy. Continue losartan  100 mg daily and low-salt diet. Instructed to let me know if BP at home is persistently > 130/80.   Hyperlipidemia with target LDL less than 100 Assessment & Plan: We  discussed labs done on 08/27/23. Continue atorvastatin  20 mg daily and low-fat diet. Will plan on repeating labs in 07/2024.     No follow-ups on file.  Srihari Shellhammer G. [place], MD  [facility]. [place] [facility].

## 2023-10-16 NOTE — Patient Instructions (Addendum)
 A few things to remember from today's visit:  External hemorrhoid  Essential hypertension  Hyperlipidemia with target LDL less than 100  Monitor blood pressure at home, let me know if it is persistently above 130/80.  If you need refills for medications you take chronically, please call your pharmacy. Do not use My Chart to request refills or for acute issues that need immediate attention. If you send a my chart message, it may take a few days to be addressed, specially if I am not in the office.  Please be sure medication list is accurate. If a new problem present, please set up appointment sooner than planned today.

## 2023-10-16 NOTE — Assessment & Plan Note (Signed)
 BP elevated today, she is not monitoring BP at home, recommend doing so before we decided to adjust antihypertensive therapy. Continue losartan  100 mg daily and low-salt diet. Instructed to let me know if BP at home is persistently > 130/80.

## 2023-10-16 NOTE — Assessment & Plan Note (Signed)
 We discussed labs done on 08/27/23. Continue atorvastatin  20 mg daily and low-fat diet. Will plan on repeating labs in 07/2024.

## 2023-10-18 DIAGNOSIS — M5416 Radiculopathy, lumbar region: Secondary | ICD-10-CM | POA: Diagnosis not present

## 2023-10-18 DIAGNOSIS — M2569 Stiffness of other specified joint, not elsewhere classified: Secondary | ICD-10-CM | POA: Diagnosis not present

## 2023-10-18 DIAGNOSIS — R293 Abnormal posture: Secondary | ICD-10-CM | POA: Diagnosis not present

## 2023-10-18 DIAGNOSIS — M6281 Muscle weakness (generalized): Secondary | ICD-10-CM | POA: Diagnosis not present

## 2023-10-23 DIAGNOSIS — R293 Abnormal posture: Secondary | ICD-10-CM | POA: Diagnosis not present

## 2023-10-23 DIAGNOSIS — M2569 Stiffness of other specified joint, not elsewhere classified: Secondary | ICD-10-CM | POA: Diagnosis not present

## 2023-10-23 DIAGNOSIS — M6281 Muscle weakness (generalized): Secondary | ICD-10-CM | POA: Diagnosis not present

## 2023-10-23 DIAGNOSIS — M5416 Radiculopathy, lumbar region: Secondary | ICD-10-CM | POA: Diagnosis not present

## 2023-10-30 DIAGNOSIS — R293 Abnormal posture: Secondary | ICD-10-CM | POA: Diagnosis not present

## 2023-10-30 DIAGNOSIS — M6281 Muscle weakness (generalized): Secondary | ICD-10-CM | POA: Diagnosis not present

## 2023-10-30 DIAGNOSIS — M5416 Radiculopathy, lumbar region: Secondary | ICD-10-CM | POA: Diagnosis not present

## 2023-10-30 DIAGNOSIS — M2569 Stiffness of other specified joint, not elsewhere classified: Secondary | ICD-10-CM | POA: Diagnosis not present

## 2023-11-01 DIAGNOSIS — M2569 Stiffness of other specified joint, not elsewhere classified: Secondary | ICD-10-CM | POA: Diagnosis not present

## 2023-11-01 DIAGNOSIS — M6281 Muscle weakness (generalized): Secondary | ICD-10-CM | POA: Diagnosis not present

## 2023-11-01 DIAGNOSIS — M5416 Radiculopathy, lumbar region: Secondary | ICD-10-CM | POA: Diagnosis not present

## 2023-11-01 DIAGNOSIS — R293 Abnormal posture: Secondary | ICD-10-CM | POA: Diagnosis not present

## 2023-11-04 LAB — FECAL OCCULT BLOOD, IMMUNOCHEMICAL: IFOBT: NEGATIVE

## 2023-11-06 DIAGNOSIS — M6281 Muscle weakness (generalized): Secondary | ICD-10-CM | POA: Diagnosis not present

## 2023-11-06 DIAGNOSIS — R293 Abnormal posture: Secondary | ICD-10-CM | POA: Diagnosis not present

## 2023-11-06 DIAGNOSIS — M5416 Radiculopathy, lumbar region: Secondary | ICD-10-CM | POA: Diagnosis not present

## 2023-11-06 DIAGNOSIS — M2569 Stiffness of other specified joint, not elsewhere classified: Secondary | ICD-10-CM | POA: Diagnosis not present

## 2023-11-18 ENCOUNTER — Encounter: Payer: Self-pay | Admitting: Family Medicine

## 2023-12-09 ENCOUNTER — Ambulatory Visit: Admitting: Gastroenterology

## 2024-02-11 ENCOUNTER — Ambulatory Visit: Attending: Nurse Practitioner | Admitting: Nurse Practitioner

## 2024-02-11 NOTE — Progress Notes (Unsigned)
 "  Office Visit    Patient Name: Veronica [name] Date of Encounter: 02/11/2024  Primary Care Provider:  Jordan, Betty G, MD Primary Cardiologist:  Alm Clay, MD  Chief Complaint    74 year old-year-old female with a history of mild nonobstructive CAD, hypertension, hyperlipidemia, asthma, and anxiety who presents for follow-up related to CAD and hypertension.   Past Medical History    Past Medical History:  Diagnosis Date   Allergic rhinitis    Anxiety and depression    Asthma    Constipation, chronic    COPD (chronic obstructive pulmonary disease) (HCC)    Coronary artery calcification seen on CAT scan 06/2010   Mild nonobstructive CAD on cardiac CT  -as of 2021, coronary calcium  score 34   Dyspnea    Hemorrhoids    Hip pain, bilateral    Hyperlipidemia    Hypertension    Leukoplakia of vagina    Low back pain    w/ left side radiculopathy, s/p injection at L4-L5 forarmen 1/06   Uterine fibroid    Past Surgical History:  Procedure Laterality Date   ABDOMINAL HYSTERECTOMY  11-2000   no oophorectomy per pt   CHOLECYSTECTOMY  12/08/2010   Procedure: LAPAROSCOPIC CHOLECYSTECTOMY WITH INTRAOPERATIVE CHOLANGIOGRAM;  Surgeon: Donnice KATHEE Lunger, MD;  Location: [facility];  Service: General;  Laterality: N/A;  c-arm    COLONOSCOPY WITH PROPOFOL  N/A 02/02/2014   Procedure: COLONOSCOPY WITH PROPOFOL ;  Surgeon: Lunger MARLA Louder, MD;  Location: [facility];  Service: Endoscopy;  Laterality: N/A;   CORONARY CALCIUM  SCORE  12/2018   Coronary calcium  score-34   CORONARY CT ANGIOGRAM  06/2010    Coronary calcium  score 12.  Mild nonobstructive disease.  Ostial LAD and first D1/RI.  Right dominant.  Focal scarring and bronchiectasis of medial right lower lobe.   CYST EXCISION Left 08/08/2021   Procedure: EXCISION LEFT UPPER BACK SEBACEOUS CYST X2;  Surgeon: Vernetta Berg, MD;  Location: [facility];  Service: General;  Laterality: Left;   LEEP     unknown date    NM MYOVIEW  LTD  10/2010    Nonischemic.  Normal EF.   TUBAL LIGATION     bilateral 1989   UTERINE FIBROID SURGERY      Allergies  Allergies[1]   Labs/Other Studies Reviewed    The following studies were reviewed today:  Cardiac Studies & Procedures   ______________________________________________________________________________________________     ECHOCARDIOGRAM  ECHOCARDIOGRAM COMPLETE 03/08/2021  Narrative ECHOCARDIOGRAM REPORT    Patient Name:   Veronica [name] [facility] Date of Exam: 03/08/2021 Medical Rec #:  [id number]      Height:       65.0 in Accession #:    [id number]     Weight:       207.2 lb Date of Birth:  10-04-1950      BSA:          2.008 m Patient Age:    71 years       BP:           138/76 mmHg Patient Gender: F              HR:           61 bpm. Exam Location:  [address]  Procedure: 2D Echo, Cardiac Doppler and Color Doppler  Indications:    R06.02 SOB; R07.9* Chest pain, unspecified  History:        Patient has no prior history of Echocardiogram examinations. Risk Factors:Hypertension and Dyslipidemia. Precordial  pain. Obesity. Coronary artery calcification seen on CAT scan. Low back pain. Aortic atherosclerosis.  Sonographer:    Jon Hacker RCS Referring Phys: [phone] Alexey Rhoads C Gahel Safley  IMPRESSIONS   1. Left ventricular ejection fraction, by estimation, is 55 to 60%. The left ventricle has normal function. The left ventricle has no regional wall motion abnormalities. Left ventricular diastolic parameters are consistent with Grade I diastolic dysfunction (impaired relaxation). 2. Right ventricular systolic function is normal. The right ventricular size is normal. There is normal pulmonary artery systolic pressure. The estimated right ventricular systolic pressure is 25.7 mmHg. 3. The mitral valve is normal in structure. Trivial mitral valve regurgitation. No evidence of mitral stenosis. 4. The aortic valve is tricuspid. Aortic valve regurgitation is trivial. Aortic valve  sclerosis/calcification is present, without any evidence of aortic stenosis. 5. The inferior vena cava is normal in size with greater than 50% respiratory variability, suggesting right atrial pressure of 3 mmHg.  FINDINGS Left Ventricle: Left ventricular ejection fraction, by estimation, is 55 to 60%. The left ventricle has normal function. The left ventricle has no regional wall motion abnormalities. The left ventricular internal cavity size was normal in size. There is no left ventricular hypertrophy. Left ventricular diastolic parameters are consistent with Grade I diastolic dysfunction (impaired relaxation).  Right Ventricle: The right ventricular size is normal. No increase in right ventricular wall thickness. Right ventricular systolic function is normal. There is normal pulmonary artery systolic pressure. The tricuspid regurgitant velocity is 2.38 m/s, and with an assumed right atrial pressure of 3 mmHg, the estimated right ventricular systolic pressure is 25.7 mmHg.  Left Atrium: Left atrial size was normal in size.  Right Atrium: Right atrial size was normal in size.  Pericardium: There is no evidence of pericardial effusion.  Mitral Valve: The mitral valve is normal in structure. Trivial mitral valve regurgitation. No evidence of mitral valve stenosis.  Tricuspid Valve: The tricuspid valve is normal in structure. Tricuspid valve regurgitation is trivial.  Aortic Valve: The aortic valve is tricuspid. Aortic valve regurgitation is trivial. Aortic regurgitation PHT measures 1139 msec. Aortic valve sclerosis/calcification is present, without any evidence of aortic stenosis.  Pulmonic Valve: The pulmonic valve was normal in structure. Pulmonic valve regurgitation is not visualized.  Aorta: The aortic root is normal in size and structure.  Venous: The inferior vena cava is normal in size with greater than 50% respiratory variability, suggesting right atrial pressure of 3  mmHg.  IAS/Shunts: No atrial level shunt detected by color flow Doppler.   LEFT VENTRICLE PLAX 2D LVIDd:         4.10 cm   Diastology LVIDs:         2.65 cm   LV e' medial:    9.03 cm/s LV PW:         1.30 cm   LV E/e' medial:  9.6 LV IVS:        1.10 cm   LV e' lateral:   13.10 cm/s LVOT diam:     2.00 cm   LV E/e' lateral: 6.6 LV SV:         71 LV SV Index:   35 LVOT Area:     3.14 cm   RIGHT VENTRICLE RV Basal diam:  2.50 cm RV S prime:     12.10 cm/s TAPSE (M-mode): 2.2 cm RVSP:           25.7 mmHg  LEFT ATRIUM  Index        RIGHT ATRIUM           Index LA diam:        3.40 cm 1.69 cm/m   RA Pressure: 3.00 mmHg LA Vol (A2C):   36.3 ml 18.08 ml/m  RA Area:     14.20 cm LA Vol (A4C):   34.9 ml 17.38 ml/m  RA Volume:   32.70 ml  16.28 ml/m LA Biplane Vol: 35.6 ml 17.73 ml/m AORTIC VALVE LVOT Vmax:   91.80 cm/s LVOT Vmean:  63.800 cm/s LVOT VTI:    0.226 m AI PHT:      1139 msec  AORTA Ao Root diam: 3.10 cm Ao Asc diam:  3.00 cm  MITRAL VALVE                TRICUSPID VALVE MV Area (PHT): 3.31 cm     TR Peak grad:   22.7 mmHg MV Decel Time: 229 msec     TR Vmax:        238.00 cm/s MV E velocity: 86.50 cm/s   Estimated RAP:  3.00 mmHg MV A velocity: 101.00 cm/s  RVSP:           25.7 mmHg MV E/A ratio:  0.86 SHUNTS Systemic VTI:  0.23 m Systemic Diam: 2.00 cm  Dalton McleanMD Electronically signed by Ezra Kanner Signature Date/Time: 03/08/2021/6:19:37 PM    Final    MONITORS  LONG TERM MONITOR (3-14 DAYS) 12/29/2019  Narrative  Sinus rhythm: Minimum heart rate 55 bpm, maximum 126 bpm with an average of 73 bpm.  Rare (< 1%) isolated PACs and PVCs with the very rare PAC couplets.  1 PAC quadruplet-officially short burst of PAT-rate 148 bpm with an average rate of 124 bpm.  No sustained arrhythmias or significant bradycardia.  Patient wear time: 13-day, 8 hours-analysis time 12 days 11/27/2019-12/10/2019  4 patient triggers with  sinus rhythm, no diary entries.  Essentially normal study with no arrhythmias and minimal PACs/PVCs.   Alm Clay, MD   CT SCANS  CT CORONARY MORPH W/CTA COR W/SCORE 03/10/2021  Addendum 03/13/2021  6:43 AM ADDENDUM REPORT: 03/13/2021 06:41  EXAM: OVER-READ INTERPRETATION  CT CHEST  The following report is an over-read performed by radiologist Dr. Waddell Cola [facility] on 03/13/2021. This over-read does not include interpretation of cardiac or coronary anatomy or pathology. The coronary CTA interpretation by the cardiologist is attached.  COMPARISON:  01/02/2019  FINDINGS: Vascular: No significant abnormality identified.  Mediastinum/nodes: No mass or adenopathy identified.  Lungs/pleura: No pleural effusion, airspace consolidation, or pneumothorax. Scar identified within the lingula.  Upper abdomen: No acute abnormality.  Musculoskeletal: Within the subcutaneous soft tissues of the lower chest wall, eccentric to left of midline, there is a well-circumscribed low-density nodule measuring 1.4 cm, image 59/11. This is favored to represent a benign epidermal inclusion cyst.  Spondylosis noted within the thoracic spine. No acute or suspicious osseous findings.  IMPRESSION: No significant noncardiac supplemental findings identified.   Electronically Signed By: Waddell Calk M.D. On: 03/13/2021 06:41  Narrative CLINICAL DATA:  Chest pain  EXAM: Cardiac CTA  MEDICATIONS: Sub lingual nitro. 4mg  and lopressor  100mg   TECHNIQUE: The patient was scanned on a Siemens Force 192 slice scanner. Gantry rotation speed was 250 msecs. Collimation was .6 mm. A 100 kV prospective scan was triggered in the ascending thoracic aorta at 140 HU's Full mA was used between 35% and 75% of the R-R interval. Average HR during  the scan was 56 bpm. The 3D data set was interpreted on a dedicated work station using MPR, MIP and VRT modes. A total of 80 cc of  contrast was used.  FINDINGS: Non-cardiac: See separate report from [facility]. No significant findings on limited lung and soft tissue windows.  Calcium  Score: Calcium  noted in LAD and LCX  LM 0  RCA 0  LAD 23  LCX 5.34  Total: 28.4  Coronary Arteries: Right dominant with no anomalies  LM: Normal  LAD: 1-24% mixed plaque with some external remodeling ostial and proximal vessel  IM: 1-24% calcified plaque  Circumflex: 1-24% calcified plaque proximally  OM1: Normal  OM2: Normal  RCA:  Normal  PDA: Normal  PLA: Normal  IMPRESSION: 1. Calcium  Score 28.4 which is 70 th percentile for age/sex  2.  Normal ascending thoracic aorta 3.3 cm  3.  CAD RADS 1 non obstructive CAD see description above  Maude Emmer  Electronically Signed: By: Maude Emmer M.D. On: 03/10/2021 16:42   CT SCANS  CT CARDIAC SCORING (SELF PAY ONLY) 01/02/2019  Addendum 01/02/2019  4:30 PM ADDENDUM REPORT: 01/02/2019 16:28  CLINICAL DATA:  Risk stratification  EXAM: Coronary Calcium  Score  TECHNIQUE: The patient was scanned on a [employer] scanner. Axial non-contrast 3 mm slices were carried out through the heart. The data set was analyzed on a dedicated work station and scored using the Agatson method.  FINDINGS: Non-cardiac: See separate report from [facility].  Ascending Aorta: Normal Caliber.  Mildly calcified.  Pericardium: Normal  Coronary arteries: Normal coronary origins. Coronary calcifications on the LAD and LCx.  IMPRESSION: Coronary calcium  score of 34. This was 68th percentile for age and sex matched control.  Wilbert Bihari   Electronically Signed By: Wilbert Bihari On: 01/02/2019 16:28  Narrative EXAM: OVER-READ INTERPRETATION  CT CHEST  The following report is an over-read performed by radiologist Dr. Rockey Kilts of [facility] on 01/02/2019. This over-read does not include interpretation of cardiac or  coronary anatomy or pathology. The calcium  score interpretation by the cardiologist is attached.  COMPARISON:  Chest radiograph 04/01/2018. CTA of the chest 10/08/2010.  FINDINGS: Vascular: Aortic atherosclerosis.  Mediastinum/Nodes: No imaged thoracic adenopathy.  Lungs/Pleura: No imaged pleural fluid. Lingular scarring or subsegmental atelectasis.  Upper Abdomen: Normal imaged portions of the liver.  Musculoskeletal: No acute osseous abnormality. Lower thoracic spondylosis.  IMPRESSION: 1.  No acute findings in the imaged extracardiac chest. 2.  Aortic Atherosclerosis (ICD10-I70.0).  Electronically Signed: By: Rockey Kilts M.D. On: 01/02/2019 14:20   CT SCANS  CT CORONARY MORPH W/CTA COR W/SCORE 07/27/2010  Narrative *RADIOLOGY REPORT*  INDICATION:  74 year old female with left upper chest pain and left upper extremity pain on the recent airplane flight from [place]. Negative cardiac enzymes x 2 while in the emergency department.  CT ANGIOGRAPHY OF THE HEART (CORONARY ARTERIES), STRUCTURE, AND MORPHOLOGY  CONTRAST:  80 ml Omnipaque  350 IV.  COMPARISON:  None.  TECHNIQUE:  CT angiography of the coronary vessels was performed on a 256 channel system using prospective ECG gating.  A scout and noncontrast exam (for calcium  scoring) were performed.  Circulation time was measured using a test bolus.  Coronary CTA was performed with sub mm slice collimation during portions of the cardiac cycle (73%, 78%, and 83%) during injection of iodinated contrast. Imaging post processing was performed on an independent workstation creating multiplanar and 3-D images, and quantitative analysis of the heart and coronary arteries.  Note that  this exam targets the heart and the chest was not imaged in its entirety.  PREMEDICATION: Lopressor  0 mg, P.O. Lopressor  0 mg, IV Nitroglycerin  0.4 mcg, sublingual.  FINDINGS: Technical quality:  Fair to moderate.  This is due to the fact  that the heart rate increased from 60 to 75 during the imaging portion of the examination and due to less than optimal coronary vasodilation after sublingual nitroglycerin . Heart rate:  75  CORONARY ARTERIES: Left main coronary artery:  Normal.  Left anterior descending:  Large ramus branch/first diagonal branch with a small second diagonal identified distally.  Nonobstructive calcified plaque in the LAD origin.  Nonobstructive calcified plaque in the proximal LAD.  Nonobstructive calcified plaque within the large first diagonal/ramus branch.  Left circumflex:  Normal.  2 small visible obtuse marginal branches.  Right coronary artery:  Normal.  Posterior descending artery:  Normal.  Dominance:  Right.  CORONARY CALCIUM :  Total Agatston Score:  12.62 MESA database percentile:  75th  CARDIAC MEASUREMENTS: Interventricular septum (6 - 12 mm):  9 mm LV posterior wall (6 - 12 mm):  10 mm LV diameter in diastole (35 - 52 mm):  44 mm  AORTA AND PULMONARY MEASUREMENTS: Aortic root (21 - 40 mm): 24 mm  at the annulus 33 mm  at the sinuses of Valsalva 22 mm  at the sinotubular junction Ascending aorta (<  40 mm):  33 mm Descending aorta (<  40 mm):  26 mm Main pulmonary artery:  (<  30 mm):  23 mm  EXTRACARDIAC FINDINGS: Focal area of scar and mild bronchiectasis medially in the left lower lobe.  Minimal scarring in the lingula.  Residual thymic tissue in the anterior mediastinum.  No significant lymphadenopathy within the visualized mediastinum.  Visualized extreme upper abdomen unremarkable.  IMPRESSION:  1.  Mild nonobstructive coronary artery disease.  The patient's total coronary artery calcium  score is 12.62, which is 75th percentile for patient's matched age and gender. 2.  Nonobstructive calcified plaques at the origin of the LAD, the proximal LAD, and in the midportion of a large first diagonal/ramus branch. 3.  Right coronary artery dominance. 4.  Focal area  of scarring and bronchiectasis medially in the right lower lobe.  No other significant non-cardiac findings.  Report was called to CDU mid level at [phone] at 1150 hours on 07/27/2010.  Delay in the report was due to issues with Information Technology in that I was initially unable to log onto the [place] [facility] independent workstation in order to perform the coronary analysis.  Original Report Authenticated By: DEBBY CHARLENA SATTERFIELD, M.D.     ______________________________________________________________________________________________     Recent Labs: 08/27/2023: ALT 22; BUN 16; Creatinine, Ser 0.71; Potassium 4.3; Sodium 136; TSH 0.94  Recent Lipid Panel    Component Value Date/Time   CHOL 195 08/27/2023 0933   CHOL 141 09/13/2022 1036   TRIG 141.0 08/27/2023 0933   HDL 52.30 08/27/2023 0933   HDL 54 09/13/2022 1036   CHOLHDL 4 08/27/2023 0933   VLDL 28.2 08/27/2023 0933   LDLCALC 114 (H) 08/27/2023 0933   LDLCALC 67 09/13/2022 1036   LDLCALC 132 (H) 08/28/2019 1048   LDLDIRECT 161.6 06/03/2012 1043    History of Present Illness   74 year old-year-old female with the above past medical history including mild nonobstructive CAD, hypertension, hyperlipidemia, asthma, and anxiety.   She established care with Dr. Anner in 2020 in the setting of strong family history of CAD. Coronary CTA in 2012 showed calcium  score  of 12, mild nonobstructive CAD. Follow-up coronary calcium  score in 2020 was 34 (68th percentile for age and sex). Outpatient Monitor in October 2021 in the setting of palpitations showed predominantly sinus rhythm, rare PACs/PVCs, short runs of PAT, no significant arrhythmia.  She had multiple ED visits in 2022 in the setting of intermittent chest pain, shortness of breath.  H time she ruled out for ACS.  Repeat coronary CTA in February 2023 in the setting of ongoing chest pain and exertional dyspnea revealed calcium  score 28.4 (70 percentile for age/sex),  nonobstructive CAD.  Echocardiogram showed EF 55 to 60%, G1 DD, aortic valve sclerosis without evidence of stenosis.  She was last seen in the office on 07/06/2022 and was stable from a cardiac standpoint.  She denied symptoms concerning for angina.  She did report intermittent fleeting palpitations, occasional dizziness.  She denied any additional testing at the time.   She presents today for follow-up. Since her last visit she    1. Mild nonobstructive CAD/chest pain/shortness of breath: Repeat coronary CTA 03/2021, showed calcium  score 28.4 (70 percentile for age/sex), nonobstructive CAD. Echo showed EF 55 to 60%, G1 DD, aortic valve sclerosis without evidence of stenosis. Dyspnea on exertion improved with inhalers and increased activity. Denies symptoms concerning for angina.  Continue losartan , Crestor .  2. Palpitations/dizziness: Monitor in October 2021 in the setting of palpitations showed predominantly sinus rhythm, rare PACs/PVCs, short runs of PAT, no significant arrhythmia.  She does note intermittent fleeting palpitations, she has had recent dizziness in the evenings and upon waking in the mornings.  She stopped taking her nighttime amlodipine  and has noticed some mild improvement in her symptoms.  She denies any presyncope, syncope.  EKG in 01/2022 showed sinus bradycardia, 59 bpm.  We discussed possibility of repeat cardiac monitor, however, patient declines.  Will check carotid Dopplers.  Will update CBC, CMET.  Discussed ED precautions.  Continue to monitor symptoms.   3. Hypertension: BP well controlled, she has had some recent dizziness.  Concur amlodipine  over the past week and her symptoms have improved slightly.  Will discontinue amlodipine .  Continue losartan .  She notes she recently picked up her losartan  from her pharmacy and the bottle read 100 mg daily.  She should be taking losartan  50 mg daily.  We will reach out to her pharmacy to clarify.  4. Hyperlipidemia: LDL was 148 in  12/2021.  Not taking her Crestor  at the time.  Will update fasting lipid panel, CMET.  Continue Crestor .   5. Obesity: Encouraged ongoing lifestyle modifications with diet and exercise.  Continues to struggle with weight loss.  I advised her to follow-up with her PCP to discuss possible benefits of GLP-1 receptor agonist for weight loss (Wegovy or Zepbound).   6. Disposition: Follow-up in  Home Medications    Current Outpatient Medications  Medication Sig Dispense Refill   atorvastatin  (LIPITOR) 20 MG tablet Take 1 tablet (20 mg total) by mouth daily. 90 tablet 2   citalopram  (CELEXA ) 10 MG tablet TAKE 1 TABLET BY MOUTH EVERY DAY 90 tablet 2   fluticasone  (FLONASE ) 50 MCG/ACT nasal spray SPRAY 2 SPRAYS INTO EACH NOSTRIL EVERY DAY 48 mL 0   hydrOXYzine  (ATARAX ) 25 MG tablet TAKE 1-2 TABLETS (25-50 MG TOTAL) BY MOUTH AT BEDTIME AS NEEDED. 180 tablet 2   losartan  (COZAAR ) 100 MG tablet Take 1 tablet (100 mg total) by mouth daily. 90 tablet 3   meloxicam  (MOBIC ) 7.5 MG tablet Take 1-2 tablets (7.5-15 mg total) by  mouth daily. For shoulder pain 30 tablet 0   No current facility-administered medications for this visit.     Review of Systems    ***.  All other systems reviewed and are otherwise negative except as noted above.    Physical Exam    VS:  There were no vitals taken for this visit. , BMI There is no height or weight on file to calculate BMI.     GEN: Well nourished, well developed, in no acute distress. HEENT: normal. Neck: Supple, no JVD, carotid bruits, or masses. Cardiac: RRR, no murmurs, rubs, or gallops. No clubbing, cyanosis, edema.  Radials/DP/PT 2+ and equal bilaterally.  Respiratory:  Respirations regular and unlabored, clear to auscultation bilaterally. GI: Soft, nontender, nondistended, BS + x 4. MS: no deformity or atrophy. Skin: warm and dry, no rash. Neuro:  Strength and sensation are intact. Psych: Normal affect.  Accessory Clinical Findings    ECG  personally reviewed by me today -    - no acute changes.   Lab Results  Component Value Date   WBC 3.9 07/09/2022   HGB 12.3 07/09/2022   HCT 37.2 07/09/2022   MCV 89 07/09/2022   PLT 216 07/09/2022   Lab Results  Component Value Date   CREATININE 0.71 08/27/2023   BUN 16 08/27/2023   NA 136 08/27/2023   K 4.3 08/27/2023   CL 102 08/27/2023   CO2 23 08/27/2023   Lab Results  Component Value Date   ALT 22 08/27/2023   AST 20 08/27/2023   ALKPHOS 76 08/27/2023   BILITOT 0.7 08/27/2023   Lab Results  Component Value Date   CHOL 195 08/27/2023   HDL 52.30 08/27/2023   LDLCALC 114 (H) 08/27/2023   LDLDIRECT 161.6 06/03/2012   TRIG 141.0 08/27/2023   CHOLHDL 4 08/27/2023    Lab Results  Component Value Date   HGBA1C 5.6 10/13/2020    Assessment & Plan    1.  ***  No BP recorded.  {Refresh Note OR Click here to enter BP  :1}***   Damien JAYSON Braver, NP 02/11/2024, 8:26 AM       [1]  Allergies Allergen Reactions   No [employer] Food Allergies Other (See Comments)    Cheese-elevated blood pressure   Peanut-Containing Drug Products Other (See Comments)    headache   "

## 2024-02-18 ENCOUNTER — Ambulatory Visit: Payer: Self-pay

## 2024-02-18 ENCOUNTER — Telehealth: Payer: Self-pay | Admitting: Cardiology

## 2024-02-18 NOTE — Telephone Encounter (Signed)
 Pt c/o BP issue: STAT if pt c/o blurred vision, one-sided weakness or slurred speech  1. What are your last 5 BP readings? 176/97 HR 75; 19896  2. Are you having any other symptoms (ex. Dizziness, headache, blurred vision, passed out)? Pain in left arm   3. What is your BP issue?

## 2024-02-18 NOTE — Telephone Encounter (Signed)
 Attempted to call pt, unable to reach pt. LVM stating that if pt is having arm pain, CP, blurred vision, slurred speech, BP consistently as high as the reading provided on the original call (170s-190s SBP) to go to ED to be evaluated. Informed pt that our office phones shut off after 5 PM each day and to make sure to get evaluated if any of these symptoms are occurring.

## 2024-02-18 NOTE — Telephone Encounter (Signed)
Noted. BJ 

## 2024-02-18 NOTE — Telephone Encounter (Signed)
 FYI Only or Action Required?: FYI only for provider: ED advised.  Patient was last seen in primary care on 10/16/2023 by Jordan, Betty G, MD.  Called Nurse Triage reporting Arm Pain and Chest Pain.  Symptoms began 5 day ago.  Interventions attempted: Nothing.  Symptoms are: stable.  Triage Disposition: Go to ED Now (Notify PCP)  Patient/caregiver understands and will follow disposition?: Yes               Reason for Disposition  [1] Age > 40 AND [2] associated chest or jaw pain AND [3] pain lasts > 5 minutes  Answer Assessment - Initial Assessment Questions Patient reports past 5 days has been feeling pain in left arm constant, from shoulder all the way to hand. Left neck pain too side by left head. Pain there. In chest not all the time burning. Pain does not radiate into jaw. Does suffer from panic attacks. 2 days ago worse day of symptoms. Takes lostartan 100 mg elevated blood pressure today and yesterday taking medication. Currently in [place]  with family this RN offered condolences for loss of family member . This RN advised patient to go to nearest ER now and someone to drive her and worsening symptoms call 911. Patient agreeable bother is going to drive her now going to [facility] [place]        1. ONSET: When did the pain start?     5 days ago  2. LOCATION: Where is the pain located?     Left arm pain down to hand , left neck pain as well    6. OTHER SYMPTOMS: Do you have any other symptoms? (e.g., neck pain, swelling, rash, fever, numbness, weakness)     Patient denies the following difficulty breathing, dizziness, syncope, fever, vomiting  Protocols used: Arm Pain-A-AH Copied from CRM #[id number]. Topic: Clinical - Medical Advice >> Feb 18, 2024  4:17 PM Sophia H wrote: Reason for CRM:  patient is in [place] due to a death in family.Requesting to speak with a nurse regarding BP yesterday being 191/82 pulse 68, this morning it was  176/97 - mild pain in left side in neck and from shoulder down her arm, feels like her chest is burning sometimes. Patient states she believes it may all be related to what's going on in her life & anxiety but would like to speak with a nurse if possible.
